# Patient Record
Sex: Female | Born: 1935 | Race: White | Hispanic: No | Smoking: Former smoker
Health system: Southern US, Community
[De-identification: ages and names within clinical notes are randomized; demographics above are authoritative.]

## PROBLEM LIST (undated history)

## (undated) DIAGNOSIS — D219 Benign neoplasm of connective and other soft tissue, unspecified: Secondary | ICD-10-CM

## (undated) DIAGNOSIS — M818 Other osteoporosis without current pathological fracture: Secondary | ICD-10-CM

## (undated) DIAGNOSIS — K589 Irritable bowel syndrome without diarrhea: Secondary | ICD-10-CM

## (undated) DIAGNOSIS — M1990 Unspecified osteoarthritis, unspecified site: Secondary | ICD-10-CM

## (undated) DIAGNOSIS — R0989 Other specified symptoms and signs involving the circulatory and respiratory systems: Secondary | ICD-10-CM

## (undated) DIAGNOSIS — N939 Abnormal uterine and vaginal bleeding, unspecified: Secondary | ICD-10-CM

## (undated) DIAGNOSIS — G629 Polyneuropathy, unspecified: Secondary | ICD-10-CM

## (undated) DIAGNOSIS — N8110 Cystocele, unspecified: Secondary | ICD-10-CM

## (undated) DIAGNOSIS — Z85038 Personal history of other malignant neoplasm of large intestine: Secondary | ICD-10-CM

## (undated) DIAGNOSIS — K648 Other hemorrhoids: Secondary | ICD-10-CM

## (undated) DIAGNOSIS — I10 Essential (primary) hypertension: Secondary | ICD-10-CM

## (undated) DIAGNOSIS — Z860100 Personal history of colon polyps, unspecified: Secondary | ICD-10-CM

## (undated) DIAGNOSIS — I8390 Asymptomatic varicose veins of unspecified lower extremity: Secondary | ICD-10-CM

## (undated) DIAGNOSIS — M4800 Spinal stenosis, site unspecified: Secondary | ICD-10-CM

## (undated) DIAGNOSIS — M169 Osteoarthritis of hip, unspecified: Secondary | ICD-10-CM

## (undated) DIAGNOSIS — I341 Nonrheumatic mitral (valve) prolapse: Secondary | ICD-10-CM

## (undated) DIAGNOSIS — E039 Hypothyroidism, unspecified: Secondary | ICD-10-CM

## (undated) DIAGNOSIS — D649 Anemia, unspecified: Secondary | ICD-10-CM

## (undated) DIAGNOSIS — K649 Unspecified hemorrhoids: Secondary | ICD-10-CM

## (undated) DIAGNOSIS — K5790 Diverticulosis of intestine, part unspecified, without perforation or abscess without bleeding: Secondary | ICD-10-CM

## (undated) DIAGNOSIS — E785 Hyperlipidemia, unspecified: Secondary | ICD-10-CM

## (undated) DIAGNOSIS — C439 Malignant melanoma of skin, unspecified: Secondary | ICD-10-CM

## (undated) DIAGNOSIS — R159 Full incontinence of feces: Secondary | ICD-10-CM

## (undated) DIAGNOSIS — N368 Other specified disorders of urethra: Secondary | ICD-10-CM

## (undated) DIAGNOSIS — K219 Gastro-esophageal reflux disease without esophagitis: Secondary | ICD-10-CM

## (undated) DIAGNOSIS — M1610 Unilateral primary osteoarthritis, unspecified hip: Secondary | ICD-10-CM

## (undated) DIAGNOSIS — N952 Postmenopausal atrophic vaginitis: Secondary | ICD-10-CM

## (undated) HISTORY — DX: Other specified disorders of urethra: N36.8

## (undated) HISTORY — DX: Nonrheumatic mitral (valve) prolapse: I34.1

## (undated) HISTORY — PX: HYSTEROSCOPY: SHX211

## (undated) HISTORY — DX: Unspecified hemorrhoids: K64.9

## (undated) HISTORY — DX: Essential (primary) hypertension: I10

## (undated) HISTORY — DX: Unspecified osteoarthritis, unspecified site: M19.90

## (undated) HISTORY — DX: Hyperlipidemia, unspecified: E78.5

## (undated) HISTORY — DX: Gastro-esophageal reflux disease without esophagitis: K21.9

## (undated) HISTORY — DX: Anemia, unspecified: D64.9

## (undated) HISTORY — DX: Other specified symptoms and signs involving the circulatory and respiratory systems: R09.89

## (undated) HISTORY — DX: Asymptomatic varicose veins of unspecified lower extremity: I83.90

## (undated) HISTORY — DX: Hypothyroidism, unspecified: E03.9

## (undated) HISTORY — DX: Polyneuropathy, unspecified: G62.9

## (undated) HISTORY — DX: Postmenopausal atrophic vaginitis: N95.2

## (undated) HISTORY — DX: Malignant melanoma of skin, unspecified: C43.9

## (undated) HISTORY — PX: DILATION AND CURETTAGE OF UTERUS: SHX78

## (undated) HISTORY — DX: Osteoarthritis of hip, unspecified: M16.9

## (undated) HISTORY — DX: Other osteoporosis without current pathological fracture: M81.8

## (undated) HISTORY — DX: Irritable bowel syndrome, unspecified: K58.9

## (undated) HISTORY — DX: Personal history of colon polyps, unspecified: Z86.0100

## (undated) HISTORY — DX: Personal history of colonic polyps: Z86.010

## (undated) HISTORY — PX: MELANOMA EXCISION: SHX5266

## (undated) HISTORY — DX: Spinal stenosis, site unspecified: M48.00

## (undated) HISTORY — DX: Diverticulosis of intestine, part unspecified, without perforation or abscess without bleeding: K57.90

## (undated) HISTORY — DX: Abnormal uterine and vaginal bleeding, unspecified: N93.9

## (undated) HISTORY — DX: Other hemorrhoids: K64.8

## (undated) HISTORY — DX: Personal history of other malignant neoplasm of large intestine: Z85.038

## (undated) HISTORY — DX: Unilateral primary osteoarthritis, unspecified hip: M16.10

## (undated) HISTORY — DX: Cystocele, unspecified: N81.10

## (undated) HISTORY — DX: Full incontinence of feces: R15.9

## (undated) HISTORY — DX: Benign neoplasm of connective and other soft tissue, unspecified: D21.9

---

## 1963-06-18 HISTORY — PX: HEMORRHOID SURGERY: SHX153

## 1998-07-18 ENCOUNTER — Encounter: Payer: Self-pay | Admitting: Obstetrics and Gynecology

## 1998-07-18 ENCOUNTER — Ambulatory Visit (HOSPITAL_COMMUNITY): Admission: RE | Admit: 1998-07-18 | Discharge: 1998-07-18 | Payer: Self-pay | Admitting: Obstetrics and Gynecology

## 1998-08-23 ENCOUNTER — Other Ambulatory Visit: Admission: RE | Admit: 1998-08-23 | Discharge: 1998-08-23 | Payer: Self-pay | Admitting: Obstetrics and Gynecology

## 1999-04-25 ENCOUNTER — Ambulatory Visit (HOSPITAL_COMMUNITY): Admission: RE | Admit: 1999-04-25 | Discharge: 1999-04-25 | Payer: Self-pay | Admitting: Gastroenterology

## 1999-04-25 ENCOUNTER — Encounter: Payer: Self-pay | Admitting: Gastroenterology

## 1999-07-13 ENCOUNTER — Encounter: Payer: Self-pay | Admitting: Urology

## 1999-07-13 ENCOUNTER — Encounter: Admission: RE | Admit: 1999-07-13 | Discharge: 1999-07-13 | Payer: Self-pay | Admitting: Urology

## 1999-07-20 ENCOUNTER — Ambulatory Visit (HOSPITAL_COMMUNITY): Admission: RE | Admit: 1999-07-20 | Discharge: 1999-07-20 | Payer: Self-pay | Admitting: Obstetrics and Gynecology

## 1999-07-20 ENCOUNTER — Encounter: Payer: Self-pay | Admitting: Obstetrics and Gynecology

## 1999-08-31 ENCOUNTER — Other Ambulatory Visit: Admission: RE | Admit: 1999-08-31 | Discharge: 1999-08-31 | Payer: Self-pay | Admitting: Obstetrics and Gynecology

## 1999-10-01 ENCOUNTER — Other Ambulatory Visit: Admission: RE | Admit: 1999-10-01 | Discharge: 1999-10-01 | Payer: Self-pay | Admitting: Obstetrics and Gynecology

## 1999-10-31 ENCOUNTER — Encounter: Payer: Self-pay | Admitting: Emergency Medicine

## 1999-10-31 ENCOUNTER — Emergency Department (HOSPITAL_COMMUNITY): Admission: EM | Admit: 1999-10-31 | Discharge: 1999-10-31 | Payer: Self-pay | Admitting: Emergency Medicine

## 1999-12-04 ENCOUNTER — Other Ambulatory Visit: Admission: RE | Admit: 1999-12-04 | Discharge: 1999-12-04 | Payer: Self-pay | Admitting: Obstetrics and Gynecology

## 1999-12-29 ENCOUNTER — Emergency Department (HOSPITAL_COMMUNITY): Admission: EM | Admit: 1999-12-29 | Discharge: 1999-12-29 | Payer: Self-pay | Admitting: Emergency Medicine

## 2000-04-08 ENCOUNTER — Other Ambulatory Visit: Admission: RE | Admit: 2000-04-08 | Discharge: 2000-04-08 | Payer: Self-pay | Admitting: Obstetrics and Gynecology

## 2000-06-17 HISTORY — PX: HEMICOLECTOMY: SHX854

## 2000-07-22 ENCOUNTER — Ambulatory Visit (HOSPITAL_COMMUNITY): Admission: RE | Admit: 2000-07-22 | Discharge: 2000-07-22 | Payer: Self-pay | Admitting: Obstetrics and Gynecology

## 2000-07-22 ENCOUNTER — Encounter: Payer: Self-pay | Admitting: Obstetrics and Gynecology

## 2000-09-02 ENCOUNTER — Other Ambulatory Visit: Admission: RE | Admit: 2000-09-02 | Discharge: 2000-09-02 | Payer: Self-pay | Admitting: Obstetrics and Gynecology

## 2000-10-06 ENCOUNTER — Encounter: Payer: Self-pay | Admitting: Obstetrics and Gynecology

## 2000-10-07 ENCOUNTER — Other Ambulatory Visit: Admission: RE | Admit: 2000-10-07 | Discharge: 2000-10-07 | Payer: Self-pay | Admitting: Gastroenterology

## 2000-10-07 ENCOUNTER — Encounter (INDEPENDENT_AMBULATORY_CARE_PROVIDER_SITE_OTHER): Payer: Self-pay

## 2000-10-08 ENCOUNTER — Ambulatory Visit (HOSPITAL_COMMUNITY): Admission: RE | Admit: 2000-10-08 | Discharge: 2000-10-08 | Payer: Self-pay | Admitting: Gastroenterology

## 2000-10-08 ENCOUNTER — Encounter: Payer: Self-pay | Admitting: Gastroenterology

## 2000-10-09 ENCOUNTER — Ambulatory Visit (HOSPITAL_COMMUNITY): Admission: RE | Admit: 2000-10-09 | Discharge: 2000-10-09 | Payer: Self-pay | Admitting: Obstetrics and Gynecology

## 2000-10-09 ENCOUNTER — Encounter (INDEPENDENT_AMBULATORY_CARE_PROVIDER_SITE_OTHER): Payer: Self-pay | Admitting: Specialist

## 2000-10-16 ENCOUNTER — Inpatient Hospital Stay (HOSPITAL_COMMUNITY): Admission: RE | Admit: 2000-10-16 | Discharge: 2000-10-21 | Payer: Self-pay | Admitting: General Surgery

## 2000-10-16 ENCOUNTER — Encounter (INDEPENDENT_AMBULATORY_CARE_PROVIDER_SITE_OTHER): Payer: Self-pay | Admitting: Specialist

## 2001-01-30 ENCOUNTER — Ambulatory Visit (HOSPITAL_COMMUNITY): Admission: RE | Admit: 2001-01-30 | Discharge: 2001-01-30 | Payer: Self-pay | Admitting: General Surgery

## 2001-01-30 ENCOUNTER — Encounter: Payer: Self-pay | Admitting: General Surgery

## 2001-02-23 ENCOUNTER — Ambulatory Visit (HOSPITAL_COMMUNITY): Admission: RE | Admit: 2001-02-23 | Discharge: 2001-02-23 | Payer: Self-pay | Admitting: Hematology and Oncology

## 2001-02-23 ENCOUNTER — Encounter: Payer: Self-pay | Admitting: Hematology and Oncology

## 2001-05-27 ENCOUNTER — Ambulatory Visit (HOSPITAL_COMMUNITY): Admission: RE | Admit: 2001-05-27 | Discharge: 2001-05-27 | Payer: Self-pay | Admitting: Hematology and Oncology

## 2001-05-27 ENCOUNTER — Encounter: Payer: Self-pay | Admitting: Hematology and Oncology

## 2001-07-21 ENCOUNTER — Encounter: Payer: Self-pay | Admitting: Obstetrics and Gynecology

## 2001-07-21 ENCOUNTER — Ambulatory Visit (HOSPITAL_COMMUNITY): Admission: RE | Admit: 2001-07-21 | Discharge: 2001-07-21 | Payer: Self-pay | Admitting: Obstetrics and Gynecology

## 2001-09-07 ENCOUNTER — Other Ambulatory Visit: Admission: RE | Admit: 2001-09-07 | Discharge: 2001-09-07 | Payer: Self-pay | Admitting: Obstetrics and Gynecology

## 2001-12-15 ENCOUNTER — Ambulatory Visit (HOSPITAL_COMMUNITY): Admission: RE | Admit: 2001-12-15 | Discharge: 2001-12-15 | Payer: Self-pay | Admitting: Hematology and Oncology

## 2001-12-15 ENCOUNTER — Encounter: Payer: Self-pay | Admitting: Hematology and Oncology

## 2002-07-01 ENCOUNTER — Encounter: Payer: Self-pay | Admitting: Internal Medicine

## 2002-07-01 ENCOUNTER — Ambulatory Visit (HOSPITAL_COMMUNITY): Admission: RE | Admit: 2002-07-01 | Discharge: 2002-07-01 | Payer: Self-pay | Admitting: Internal Medicine

## 2002-08-09 ENCOUNTER — Encounter: Payer: Self-pay | Admitting: Obstetrics and Gynecology

## 2002-08-09 ENCOUNTER — Ambulatory Visit (HOSPITAL_COMMUNITY): Admission: RE | Admit: 2002-08-09 | Discharge: 2002-08-09 | Payer: Self-pay | Admitting: Obstetrics and Gynecology

## 2002-09-10 ENCOUNTER — Other Ambulatory Visit: Admission: RE | Admit: 2002-09-10 | Discharge: 2002-09-10 | Payer: Self-pay | Admitting: Obstetrics and Gynecology

## 2003-06-24 ENCOUNTER — Ambulatory Visit (HOSPITAL_COMMUNITY): Admission: RE | Admit: 2003-06-24 | Discharge: 2003-06-24 | Payer: Self-pay | Admitting: Oncology

## 2003-08-11 ENCOUNTER — Ambulatory Visit (HOSPITAL_COMMUNITY): Admission: RE | Admit: 2003-08-11 | Discharge: 2003-08-11 | Payer: Self-pay | Admitting: Oncology

## 2003-09-13 ENCOUNTER — Other Ambulatory Visit: Admission: RE | Admit: 2003-09-13 | Discharge: 2003-09-13 | Payer: Self-pay | Admitting: Obstetrics and Gynecology

## 2003-12-20 ENCOUNTER — Ambulatory Visit (HOSPITAL_COMMUNITY): Admission: RE | Admit: 2003-12-20 | Discharge: 2003-12-20 | Payer: Self-pay | Admitting: Oncology

## 2003-12-23 ENCOUNTER — Ambulatory Visit (HOSPITAL_COMMUNITY): Admission: RE | Admit: 2003-12-23 | Discharge: 2003-12-23 | Payer: Self-pay | Admitting: Oncology

## 2004-04-20 ENCOUNTER — Ambulatory Visit: Payer: Self-pay | Admitting: Cardiology

## 2004-05-07 ENCOUNTER — Ambulatory Visit: Payer: Self-pay | Admitting: Internal Medicine

## 2004-06-04 ENCOUNTER — Ambulatory Visit: Payer: Self-pay | Admitting: Gastroenterology

## 2004-06-13 ENCOUNTER — Ambulatory Visit: Payer: Self-pay | Admitting: Gastroenterology

## 2004-06-17 HISTORY — PX: COLONOSCOPY W/ POLYPECTOMY: SHX1380

## 2004-06-26 ENCOUNTER — Ambulatory Visit (HOSPITAL_COMMUNITY): Admission: RE | Admit: 2004-06-26 | Discharge: 2004-06-26 | Payer: Self-pay | Admitting: Oncology

## 2004-06-26 ENCOUNTER — Ambulatory Visit: Payer: Self-pay | Admitting: Oncology

## 2004-06-27 ENCOUNTER — Ambulatory Visit (HOSPITAL_COMMUNITY): Admission: RE | Admit: 2004-06-27 | Discharge: 2004-06-27 | Payer: Self-pay | Admitting: Oncology

## 2004-08-16 ENCOUNTER — Ambulatory Visit: Payer: Self-pay | Admitting: Internal Medicine

## 2004-08-21 ENCOUNTER — Ambulatory Visit (HOSPITAL_COMMUNITY): Admission: RE | Admit: 2004-08-21 | Discharge: 2004-08-21 | Payer: Self-pay | Admitting: Obstetrics and Gynecology

## 2004-08-23 ENCOUNTER — Ambulatory Visit: Payer: Self-pay

## 2004-09-26 ENCOUNTER — Other Ambulatory Visit: Admission: RE | Admit: 2004-09-26 | Discharge: 2004-09-26 | Payer: Self-pay | Admitting: Obstetrics and Gynecology

## 2004-11-16 ENCOUNTER — Ambulatory Visit: Payer: Self-pay | Admitting: Cardiology

## 2004-12-12 ENCOUNTER — Ambulatory Visit: Payer: Self-pay | Admitting: Gastroenterology

## 2004-12-20 ENCOUNTER — Ambulatory Visit: Payer: Self-pay | Admitting: Gastroenterology

## 2004-12-24 ENCOUNTER — Ambulatory Visit: Payer: Self-pay | Admitting: Oncology

## 2004-12-27 ENCOUNTER — Ambulatory Visit (HOSPITAL_COMMUNITY): Admission: RE | Admit: 2004-12-27 | Discharge: 2004-12-27 | Payer: Self-pay | Admitting: Oncology

## 2005-04-24 ENCOUNTER — Ambulatory Visit: Payer: Self-pay | Admitting: Internal Medicine

## 2005-05-01 ENCOUNTER — Ambulatory Visit: Payer: Self-pay | Admitting: Gastroenterology

## 2005-06-18 ENCOUNTER — Ambulatory Visit: Payer: Self-pay | Admitting: Internal Medicine

## 2005-06-21 ENCOUNTER — Ambulatory Visit: Payer: Self-pay | Admitting: Cardiology

## 2005-06-24 ENCOUNTER — Ambulatory Visit: Payer: Self-pay | Admitting: Oncology

## 2005-06-27 ENCOUNTER — Ambulatory Visit (HOSPITAL_COMMUNITY): Admission: RE | Admit: 2005-06-27 | Discharge: 2005-06-27 | Payer: Self-pay | Admitting: Oncology

## 2005-07-19 ENCOUNTER — Encounter: Admission: RE | Admit: 2005-07-19 | Discharge: 2005-07-19 | Payer: Self-pay | Admitting: Otolaryngology

## 2005-08-22 ENCOUNTER — Ambulatory Visit (HOSPITAL_COMMUNITY): Admission: RE | Admit: 2005-08-22 | Discharge: 2005-08-22 | Payer: Self-pay | Admitting: Obstetrics and Gynecology

## 2005-09-30 ENCOUNTER — Other Ambulatory Visit: Admission: RE | Admit: 2005-09-30 | Discharge: 2005-09-30 | Payer: Self-pay | Admitting: Obstetrics and Gynecology

## 2005-10-23 ENCOUNTER — Ambulatory Visit: Payer: Self-pay | Admitting: Gastroenterology

## 2005-11-01 ENCOUNTER — Ambulatory Visit: Payer: Self-pay | Admitting: Gastroenterology

## 2005-12-09 ENCOUNTER — Ambulatory Visit: Payer: Self-pay | Admitting: Gastroenterology

## 2005-12-27 ENCOUNTER — Ambulatory Visit: Payer: Self-pay | Admitting: Cardiology

## 2005-12-30 ENCOUNTER — Ambulatory Visit: Payer: Self-pay | Admitting: Gastroenterology

## 2005-12-30 ENCOUNTER — Ambulatory Visit: Payer: Self-pay | Admitting: Cardiology

## 2006-01-21 ENCOUNTER — Ambulatory Visit: Payer: Self-pay | Admitting: Internal Medicine

## 2006-02-05 ENCOUNTER — Ambulatory Visit: Payer: Self-pay | Admitting: Internal Medicine

## 2006-05-05 ENCOUNTER — Ambulatory Visit: Payer: Self-pay | Admitting: Gastroenterology

## 2006-05-06 ENCOUNTER — Ambulatory Visit: Payer: Self-pay | Admitting: Gastroenterology

## 2006-05-06 LAB — CONVERTED CEMR LAB
OCCULT 2: NEGATIVE
OCCULT 3: NEGATIVE
OCCULT 4: NEGATIVE

## 2006-06-19 ENCOUNTER — Ambulatory Visit: Payer: Self-pay | Admitting: Oncology

## 2006-06-24 LAB — CBC WITH DIFFERENTIAL/PLATELET
Eosinophils Absolute: 0.1 10*3/uL (ref 0.0–0.5)
LYMPH%: 27.4 % (ref 14.0–48.0)
MCV: 93 fL (ref 81.0–101.0)
MONO%: 7.1 % (ref 0.0–13.0)
NEUT#: 2.9 10*3/uL (ref 1.5–6.5)
Platelets: 200 10*3/uL (ref 145–400)
RBC: 3.93 10*6/uL (ref 3.70–5.32)

## 2006-06-24 LAB — COMPREHENSIVE METABOLIC PANEL
Alkaline Phosphatase: 65 U/L (ref 39–117)
BUN: 18 mg/dL (ref 6–23)
Creatinine, Ser: 0.71 mg/dL (ref 0.40–1.20)
Glucose, Bld: 113 mg/dL — ABNORMAL HIGH (ref 70–99)
Sodium: 141 mEq/L (ref 135–145)
Total Bilirubin: 0.4 mg/dL (ref 0.3–1.2)
Total Protein: 6.9 g/dL (ref 6.0–8.3)

## 2006-06-24 LAB — LACTATE DEHYDROGENASE: LDH: 181 U/L (ref 94–250)

## 2006-06-26 ENCOUNTER — Ambulatory Visit (HOSPITAL_COMMUNITY): Admission: RE | Admit: 2006-06-26 | Discharge: 2006-06-26 | Payer: Self-pay | Admitting: Oncology

## 2006-06-26 ENCOUNTER — Ambulatory Visit: Payer: Self-pay | Admitting: Gastroenterology

## 2006-07-16 ENCOUNTER — Ambulatory Visit: Payer: Self-pay | Admitting: Internal Medicine

## 2006-08-13 ENCOUNTER — Ambulatory Visit: Payer: Self-pay | Admitting: Gastroenterology

## 2006-08-21 ENCOUNTER — Ambulatory Visit: Payer: Self-pay | Admitting: Internal Medicine

## 2006-08-22 ENCOUNTER — Observation Stay (HOSPITAL_COMMUNITY): Admission: EM | Admit: 2006-08-22 | Discharge: 2006-08-22 | Payer: Self-pay | Admitting: Emergency Medicine

## 2006-08-22 ENCOUNTER — Ambulatory Visit: Payer: Self-pay | Admitting: Cardiology

## 2006-08-26 ENCOUNTER — Ambulatory Visit (HOSPITAL_COMMUNITY): Admission: RE | Admit: 2006-08-26 | Discharge: 2006-08-26 | Payer: Self-pay | Admitting: Family Medicine

## 2006-09-03 ENCOUNTER — Encounter: Admission: RE | Admit: 2006-09-03 | Discharge: 2006-09-03 | Payer: Self-pay | Admitting: Otolaryngology

## 2006-10-07 ENCOUNTER — Other Ambulatory Visit: Admission: RE | Admit: 2006-10-07 | Discharge: 2006-10-07 | Payer: Self-pay | Admitting: Obstetrics and Gynecology

## 2006-11-19 ENCOUNTER — Ambulatory Visit: Payer: Self-pay | Admitting: Gastroenterology

## 2006-11-19 LAB — CONVERTED CEMR LAB
Saturation Ratios: 15.3 % — ABNORMAL LOW (ref 20.0–50.0)
Tissue Transglutaminase Ab, IgA: <1 units (ref <7)
Vitamin B-12: 449 pg/mL (ref 211–911)

## 2006-11-28 ENCOUNTER — Ambulatory Visit: Payer: Self-pay | Admitting: Internal Medicine

## 2006-12-02 ENCOUNTER — Ambulatory Visit: Payer: Self-pay | Admitting: Internal Medicine

## 2006-12-02 LAB — CONVERTED CEMR LAB
CO2: 33 meq/L — ABNORMAL HIGH (ref 19–32)
Chloride: 105 meq/L (ref 96–112)
Cholesterol: 209 mg/dL (ref 0–200)
Creatinine, Ser: 0.6 mg/dL (ref 0.4–1.2)
Glucose, Bld: 95 mg/dL (ref 70–99)
HDL: 50 mg/dL (ref >39.0)
Hgb A1c MFr Bld: 5.9 % (ref 4.6–6.0)
Potassium: 4.5 meq/L (ref 3.5–5.1)
Sodium: 143 meq/L (ref 135–145)
VLDL: 27 mg/dL (ref 0–40)

## 2006-12-08 ENCOUNTER — Ambulatory Visit: Payer: Self-pay | Admitting: Internal Medicine

## 2006-12-12 ENCOUNTER — Encounter: Admission: RE | Admit: 2006-12-12 | Discharge: 2006-12-12 | Payer: Self-pay | Admitting: Internal Medicine

## 2006-12-12 ENCOUNTER — Ambulatory Visit: Payer: Self-pay | Admitting: Internal Medicine

## 2006-12-16 ENCOUNTER — Ambulatory Visit: Payer: Self-pay | Admitting: Cardiology

## 2006-12-17 ENCOUNTER — Ambulatory Visit: Payer: Self-pay | Admitting: Internal Medicine

## 2007-02-25 ENCOUNTER — Encounter: Payer: Self-pay | Admitting: Internal Medicine

## 2007-02-25 DIAGNOSIS — G609 Hereditary and idiopathic neuropathy, unspecified: Secondary | ICD-10-CM

## 2007-02-25 DIAGNOSIS — M48061 Spinal stenosis, lumbar region without neurogenic claudication: Secondary | ICD-10-CM | POA: Insufficient documentation

## 2007-02-25 DIAGNOSIS — E785 Hyperlipidemia, unspecified: Secondary | ICD-10-CM

## 2007-02-25 DIAGNOSIS — Z85038 Personal history of other malignant neoplasm of large intestine: Secondary | ICD-10-CM

## 2007-02-25 DIAGNOSIS — E039 Hypothyroidism, unspecified: Secondary | ICD-10-CM

## 2007-02-25 DIAGNOSIS — Z9189 Other specified personal risk factors, not elsewhere classified: Secondary | ICD-10-CM | POA: Insufficient documentation

## 2007-02-25 DIAGNOSIS — Z8719 Personal history of other diseases of the digestive system: Secondary | ICD-10-CM

## 2007-02-25 DIAGNOSIS — K219 Gastro-esophageal reflux disease without esophagitis: Secondary | ICD-10-CM

## 2007-03-27 ENCOUNTER — Ambulatory Visit: Payer: Self-pay | Admitting: Internal Medicine

## 2007-03-27 LAB — CONVERTED CEMR LAB
Basophils Absolute: 0 10*3/uL (ref 0.0–0.1)
Eosinophils Absolute: 0.2 10*3/uL (ref 0.0–0.6)
Ferritin: 12.9 ng/mL (ref 10.0–291.0)
HCT: 36.8 % (ref 36.0–46.0)
Hemoglobin: 12.7 g/dL (ref 12.0–15.0)
Lymphocytes Relative: 27.4 % (ref 12.0–46.0)
MCHC: 34.4 g/dL (ref 30.0–36.0)
MCV: 93.2 fL (ref 78.0–100.0)
Monocytes Absolute: 0.4 10*3/uL (ref 0.2–0.7)
Monocytes Relative: 7 % (ref 3.0–11.0)
Neutro Abs: 3.5 10*3/uL (ref 1.4–7.7)
Neutrophils Relative %: 61.9 % (ref 43.0–77.0)

## 2007-04-28 ENCOUNTER — Ambulatory Visit: Payer: Self-pay | Admitting: Internal Medicine

## 2007-04-28 LAB — CONVERTED CEMR LAB
Basophils Absolute: 0 10*3/uL (ref 0.0–0.1)
Basophils Relative: 0.5 % (ref 0.0–1.0)
Ferritin: 25.9 ng/mL (ref 10.0–291.0)
Hemoglobin: 12.9 g/dL (ref 12.0–15.0)
MCHC: 34.8 g/dL (ref 30.0–36.0)
Monocytes Absolute: 0.3 10*3/uL (ref 0.2–0.7)
Monocytes Relative: 7 % (ref 3.0–11.0)
Platelets: 172 10*3/uL (ref 150–400)
RDW: 13.2 % (ref 11.5–14.6)

## 2007-05-01 ENCOUNTER — Ambulatory Visit: Payer: Self-pay | Admitting: Internal Medicine

## 2007-05-06 ENCOUNTER — Encounter: Payer: Self-pay | Admitting: Internal Medicine

## 2007-05-07 ENCOUNTER — Encounter: Payer: Self-pay | Admitting: Internal Medicine

## 2007-05-12 ENCOUNTER — Ambulatory Visit: Payer: Self-pay | Admitting: Internal Medicine

## 2007-06-12 ENCOUNTER — Encounter: Payer: Self-pay | Admitting: Internal Medicine

## 2007-06-12 ENCOUNTER — Ambulatory Visit: Payer: Self-pay | Admitting: Internal Medicine

## 2007-06-12 DIAGNOSIS — K589 Irritable bowel syndrome without diarrhea: Secondary | ICD-10-CM | POA: Insufficient documentation

## 2007-06-12 DIAGNOSIS — D509 Iron deficiency anemia, unspecified: Secondary | ICD-10-CM

## 2007-06-12 DIAGNOSIS — K648 Other hemorrhoids: Secondary | ICD-10-CM

## 2007-06-12 LAB — CONVERTED CEMR LAB: Ferritin: 25.3 ng/mL (ref 10.0–291.0)

## 2007-06-18 HISTORY — PX: ESOPHAGOGASTRODUODENOSCOPY: SHX1529

## 2007-06-22 ENCOUNTER — Ambulatory Visit: Payer: Self-pay | Admitting: Oncology

## 2007-06-24 ENCOUNTER — Encounter: Payer: Self-pay | Admitting: Internal Medicine

## 2007-06-24 LAB — CBC WITH DIFFERENTIAL/PLATELET
BASO%: 0.2 % (ref 0.0–2.0)
Basophils Absolute: 0 10*3/uL (ref 0.0–0.1)
Eosinophils Absolute: 0.1 10*3/uL (ref 0.0–0.5)
HCT: 35.3 % (ref 34.8–46.6)
HGB: 12.1 g/dL (ref 11.6–15.9)
MONO#: 0.3 10*3/uL (ref 0.1–0.9)
NEUT#: 4.1 10*3/uL (ref 1.5–6.5)
NEUT%: 69.9 % (ref 39.6–76.8)
Platelets: 210 10*3/uL (ref 145–400)
WBC: 5.9 10*3/uL (ref 3.9–10.0)
lymph#: 1.3 10*3/uL (ref 0.9–3.3)

## 2007-06-25 LAB — COMPREHENSIVE METABOLIC PANEL
AST: 32 U/L (ref 0–37)
Albumin: 3.9 g/dL (ref 3.5–5.2)
BUN: 18 mg/dL (ref 6–23)
CO2: 26 mEq/L (ref 19–32)
Calcium: 8.6 mg/dL (ref 8.4–10.5)
Chloride: 105 mEq/L (ref 96–112)
Glucose, Bld: 102 mg/dL — ABNORMAL HIGH (ref 70–99)
Potassium: 3.7 mEq/L (ref 3.5–5.3)

## 2007-06-25 LAB — LACTATE DEHYDROGENASE: LDH: 192 U/L (ref 94–250)

## 2007-06-30 ENCOUNTER — Encounter: Payer: Self-pay | Admitting: Internal Medicine

## 2007-07-13 ENCOUNTER — Ambulatory Visit: Payer: Self-pay | Admitting: Internal Medicine

## 2007-07-13 DIAGNOSIS — K5289 Other specified noninfective gastroenteritis and colitis: Secondary | ICD-10-CM

## 2007-07-13 DIAGNOSIS — C439 Malignant melanoma of skin, unspecified: Secondary | ICD-10-CM | POA: Insufficient documentation

## 2007-08-06 ENCOUNTER — Telehealth (INDEPENDENT_AMBULATORY_CARE_PROVIDER_SITE_OTHER): Payer: Self-pay | Admitting: *Deleted

## 2007-08-10 ENCOUNTER — Ambulatory Visit: Payer: Self-pay | Admitting: Internal Medicine

## 2007-08-11 ENCOUNTER — Ambulatory Visit: Payer: Self-pay | Admitting: Internal Medicine

## 2007-08-11 ENCOUNTER — Encounter: Payer: Self-pay | Admitting: Internal Medicine

## 2007-09-01 ENCOUNTER — Ambulatory Visit (HOSPITAL_COMMUNITY): Admission: RE | Admit: 2007-09-01 | Discharge: 2007-09-01 | Payer: Self-pay | Admitting: Obstetrics and Gynecology

## 2007-09-15 ENCOUNTER — Ambulatory Visit: Payer: Self-pay | Admitting: Internal Medicine

## 2007-09-15 ENCOUNTER — Encounter: Payer: Self-pay | Admitting: Internal Medicine

## 2007-09-15 LAB — CONVERTED CEMR LAB
Basophils Absolute: 0 10*3/uL (ref 0.0–0.1)
Ferritin: 34.2 ng/mL (ref 10.0–291.0)
Hemoglobin: 13.2 g/dL (ref 12.0–15.0)
Lymphocytes Relative: 18.7 % (ref 12.0–46.0)
MCHC: 33.7 g/dL (ref 30.0–36.0)
Monocytes Relative: 6.2 % (ref 3.0–12.0)
Neutrophils Relative %: 72.9 % (ref 43.0–77.0)
RBC: 4.15 M/uL (ref 3.87–5.11)
RDW: 13.4 % (ref 11.5–14.6)

## 2007-09-30 ENCOUNTER — Encounter: Payer: Self-pay | Admitting: Internal Medicine

## 2007-10-12 ENCOUNTER — Other Ambulatory Visit: Admission: RE | Admit: 2007-10-12 | Discharge: 2007-10-12 | Payer: Self-pay | Admitting: Obstetrics and Gynecology

## 2007-12-03 ENCOUNTER — Ambulatory Visit: Payer: Self-pay | Admitting: Internal Medicine

## 2007-12-03 DIAGNOSIS — N952 Postmenopausal atrophic vaginitis: Secondary | ICD-10-CM | POA: Insufficient documentation

## 2007-12-03 DIAGNOSIS — M810 Age-related osteoporosis without current pathological fracture: Secondary | ICD-10-CM

## 2007-12-03 DIAGNOSIS — D649 Anemia, unspecified: Secondary | ICD-10-CM

## 2007-12-08 ENCOUNTER — Ambulatory Visit: Payer: Self-pay | Admitting: Internal Medicine

## 2007-12-08 LAB — CONVERTED CEMR LAB
Direct LDL: 123.7 mg/dL
HDL: 44.3 mg/dL (ref >39.0)
Total CHOL/HDL Ratio: 4.6
VLDL: 26 mg/dL (ref 0–40)

## 2007-12-14 ENCOUNTER — Encounter: Payer: Self-pay | Admitting: Internal Medicine

## 2007-12-14 LAB — CONVERTED CEMR LAB
Basophils Relative: 0.5 % (ref 0.0–1.0)
Eosinophils Relative: 3.2 % (ref 0.0–5.0)
HCT: 39 % (ref 36.0–46.0)
Hemoglobin: 13.4 g/dL (ref 12.0–15.0)
Lymphocytes Relative: 26.4 % (ref 12.0–46.0)
Monocytes Absolute: 0.4 10*3/uL (ref 0.1–1.0)
Monocytes Relative: 6.9 % (ref 3.0–12.0)
Neutro Abs: 3.2 10*3/uL (ref 1.4–7.7)
RBC: 4.08 M/uL (ref 3.87–5.11)

## 2007-12-17 ENCOUNTER — Ambulatory Visit: Payer: Self-pay | Admitting: Cardiology

## 2008-01-19 ENCOUNTER — Encounter: Payer: Self-pay | Admitting: Internal Medicine

## 2008-01-24 ENCOUNTER — Encounter: Payer: Self-pay | Admitting: Internal Medicine

## 2008-01-26 ENCOUNTER — Encounter: Payer: Self-pay | Admitting: Internal Medicine

## 2008-01-27 ENCOUNTER — Ambulatory Visit: Payer: Self-pay | Admitting: Internal Medicine

## 2008-02-08 ENCOUNTER — Encounter: Admission: RE | Admit: 2008-02-08 | Discharge: 2008-02-08 | Payer: Self-pay | Admitting: Internal Medicine

## 2008-02-09 ENCOUNTER — Telehealth: Payer: Self-pay | Admitting: Internal Medicine

## 2008-02-12 ENCOUNTER — Ambulatory Visit: Payer: Self-pay | Admitting: Oncology

## 2008-02-16 ENCOUNTER — Encounter: Payer: Self-pay | Admitting: Internal Medicine

## 2008-02-16 LAB — CBC WITH DIFFERENTIAL/PLATELET
Basophils Absolute: 0 10*3/uL (ref 0.0–0.1)
EOS%: 1.9 % (ref 0.0–7.0)
HGB: 12.6 g/dL (ref 11.6–15.9)
LYMPH%: 14.3 % (ref 14.0–48.0)
MCH: 32 pg (ref 26.0–34.0)
MCV: 93.7 fL (ref 81.0–101.0)
MONO%: 6.8 % (ref 0.0–13.0)
Platelets: 167 10*3/uL (ref 145–400)
RBC: 3.93 10*6/uL (ref 3.70–5.32)
RDW: 13.6 % (ref 11.3–14.5)

## 2008-02-16 LAB — COMPREHENSIVE METABOLIC PANEL
ALT: 33 U/L (ref 0–35)
AST: 46 U/L — ABNORMAL HIGH (ref 0–37)
Albumin: 4 g/dL (ref 3.5–5.2)
Calcium: 9.1 mg/dL (ref 8.4–10.5)
Chloride: 100 mEq/L (ref 96–112)
Potassium: 4.1 mEq/L (ref 3.5–5.3)

## 2008-03-08 ENCOUNTER — Ambulatory Visit: Payer: Self-pay | Admitting: Obstetrics and Gynecology

## 2008-04-21 ENCOUNTER — Telehealth: Payer: Self-pay | Admitting: Internal Medicine

## 2008-05-10 ENCOUNTER — Telehealth: Payer: Self-pay | Admitting: Internal Medicine

## 2008-05-26 ENCOUNTER — Telehealth (INDEPENDENT_AMBULATORY_CARE_PROVIDER_SITE_OTHER): Payer: Self-pay | Admitting: *Deleted

## 2008-05-30 ENCOUNTER — Ambulatory Visit: Payer: Self-pay | Admitting: Internal Medicine

## 2008-06-08 ENCOUNTER — Ambulatory Visit: Payer: Self-pay | Admitting: Obstetrics and Gynecology

## 2008-06-16 ENCOUNTER — Ambulatory Visit: Payer: Self-pay | Admitting: Oncology

## 2008-06-21 ENCOUNTER — Encounter: Payer: Self-pay | Admitting: Internal Medicine

## 2008-06-21 LAB — LACTATE DEHYDROGENASE: LDH: 177 U/L (ref 94–250)

## 2008-06-21 LAB — COMPREHENSIVE METABOLIC PANEL
ALT: 17 U/L (ref 0–35)
AST: 30 U/L (ref 0–37)
CO2: 31 mEq/L (ref 19–32)
Calcium: 8.9 mg/dL (ref 8.4–10.5)
Chloride: 103 mEq/L (ref 96–112)
Sodium: 142 mEq/L (ref 135–145)
Total Protein: 6.7 g/dL (ref 6.0–8.3)

## 2008-06-21 LAB — CBC WITH DIFFERENTIAL/PLATELET
Basophils Absolute: 0 10*3/uL (ref 0.0–0.1)
Eosinophils Absolute: 0.1 10*3/uL (ref 0.0–0.5)
HCT: 35.6 % (ref 34.8–46.6)
HGB: 12.2 g/dL (ref 11.6–15.9)
MONO#: 0.4 10*3/uL (ref 0.1–0.9)
NEUT#: 4 10*3/uL (ref 1.5–6.5)
NEUT%: 75.5 % (ref 39.6–76.8)
WBC: 5.3 10*3/uL (ref 3.9–10.0)
lymph#: 0.8 10*3/uL — ABNORMAL LOW (ref 0.9–3.3)

## 2008-06-21 LAB — MORPHOLOGY: PLT EST: ADEQUATE

## 2008-06-28 ENCOUNTER — Encounter: Payer: Self-pay | Admitting: Internal Medicine

## 2008-07-19 ENCOUNTER — Encounter: Payer: Self-pay | Admitting: Internal Medicine

## 2008-09-01 ENCOUNTER — Ambulatory Visit (HOSPITAL_COMMUNITY): Admission: RE | Admit: 2008-09-01 | Discharge: 2008-09-01 | Payer: Self-pay | Admitting: Obstetrics and Gynecology

## 2008-09-21 ENCOUNTER — Encounter: Payer: Self-pay | Admitting: Internal Medicine

## 2008-10-12 ENCOUNTER — Other Ambulatory Visit: Admission: RE | Admit: 2008-10-12 | Discharge: 2008-10-12 | Payer: Self-pay | Admitting: Obstetrics and Gynecology

## 2008-10-12 ENCOUNTER — Encounter: Payer: Self-pay | Admitting: Obstetrics and Gynecology

## 2008-10-12 ENCOUNTER — Ambulatory Visit: Payer: Self-pay | Admitting: Obstetrics and Gynecology

## 2008-10-25 ENCOUNTER — Ambulatory Visit: Payer: Self-pay | Admitting: Internal Medicine

## 2008-10-31 ENCOUNTER — Telehealth: Payer: Self-pay | Admitting: Internal Medicine

## 2008-11-29 ENCOUNTER — Encounter: Payer: Self-pay | Admitting: Internal Medicine

## 2008-12-16 ENCOUNTER — Telehealth: Payer: Self-pay | Admitting: Internal Medicine

## 2009-01-06 ENCOUNTER — Ambulatory Visit: Payer: Self-pay | Admitting: Internal Medicine

## 2009-01-06 DIAGNOSIS — I8390 Asymptomatic varicose veins of unspecified lower extremity: Secondary | ICD-10-CM

## 2009-01-06 LAB — CONVERTED CEMR LAB
BUN: 19 mg/dL (ref 6–23)
Calcium: 9.3 mg/dL (ref 8.4–10.5)
Chloride: 105 meq/L (ref 96–112)
Creatinine, Ser: 0.9 mg/dL (ref 0.4–1.2)
Eosinophils Relative: 2.7 % (ref 0.0–5.0)
Folate: >20 ng/mL
Iron: 79 ug/dL (ref 42–145)
Lymphocytes Relative: 25.4 % (ref 12.0–46.0)
MCV: 94.3 fL (ref 78.0–100.0)
Monocytes Absolute: 0.3 10*3/uL (ref 0.1–1.0)
Monocytes Relative: 5.4 % (ref 3.0–12.0)
Neutrophils Relative %: 66.2 % (ref 43.0–77.0)
PTH: 41.6 pg/mL (ref 14.0–72.0)
Platelets: 173 10*3/uL (ref 150.0–400.0)
Saturation Ratios: 17.6 % — ABNORMAL LOW (ref 20.0–50.0)
Vitamin B-12: 568 pg/mL (ref 211–911)
WBC: 5.7 10*3/uL (ref 4.5–10.5)

## 2009-01-11 ENCOUNTER — Encounter: Payer: Self-pay | Admitting: Internal Medicine

## 2009-02-03 ENCOUNTER — Ambulatory Visit: Payer: Self-pay | Admitting: Gynecology

## 2009-02-07 ENCOUNTER — Ambulatory Visit: Payer: Self-pay | Admitting: Cardiology

## 2009-03-06 ENCOUNTER — Ambulatory Visit: Payer: Self-pay | Admitting: Internal Medicine

## 2009-03-06 ENCOUNTER — Telehealth: Payer: Self-pay | Admitting: Internal Medicine

## 2009-03-24 ENCOUNTER — Ambulatory Visit: Payer: Self-pay | Admitting: Obstetrics and Gynecology

## 2009-05-29 ENCOUNTER — Ambulatory Visit: Payer: Self-pay | Admitting: Internal Medicine

## 2009-06-02 ENCOUNTER — Telehealth: Payer: Self-pay | Admitting: Internal Medicine

## 2009-06-03 ENCOUNTER — Telehealth: Payer: Self-pay | Admitting: Internal Medicine

## 2009-06-13 ENCOUNTER — Ambulatory Visit: Payer: Self-pay | Admitting: Obstetrics and Gynecology

## 2009-06-15 ENCOUNTER — Ambulatory Visit: Payer: Self-pay | Admitting: Oncology

## 2009-06-20 ENCOUNTER — Encounter: Payer: Self-pay | Admitting: Internal Medicine

## 2009-06-20 LAB — CBC WITH DIFFERENTIAL/PLATELET
BASO%: 0.4 % (ref 0.0–2.0)
EOS%: 1.8 % (ref 0.0–7.0)
LYMPH%: 17.5 % (ref 14.0–49.7)
MCHC: 34 g/dL (ref 31.5–36.0)
MONO#: 0.4 10*3/uL (ref 0.1–0.9)
Platelets: 193 10*3/uL (ref 145–400)
RBC: 4.01 10*6/uL (ref 3.70–5.45)
WBC: 6.8 10*3/uL (ref 3.9–10.3)

## 2009-06-20 LAB — IRON AND TIBC
%SAT: 13 % — ABNORMAL LOW (ref 20–55)
TIBC: 416 ug/dL (ref 250–470)

## 2009-06-20 LAB — FERRITIN: Ferritin: 34 ng/mL (ref 10–291)

## 2009-06-20 LAB — COMPREHENSIVE METABOLIC PANEL
ALT: 23 U/L (ref 0–35)
AST: 39 U/L — ABNORMAL HIGH (ref 0–37)
Alkaline Phosphatase: 83 U/L (ref 39–117)
CO2: 29 mEq/L (ref 19–32)
Sodium: 140 mEq/L (ref 135–145)
Total Bilirubin: 0.4 mg/dL (ref 0.3–1.2)
Total Protein: 7 g/dL (ref 6.0–8.3)

## 2009-06-20 LAB — LACTATE DEHYDROGENASE: LDH: 204 U/L (ref 94–250)

## 2009-07-03 ENCOUNTER — Encounter: Payer: Self-pay | Admitting: Internal Medicine

## 2009-09-05 ENCOUNTER — Ambulatory Visit (HOSPITAL_COMMUNITY): Admission: RE | Admit: 2009-09-05 | Discharge: 2009-09-05 | Payer: Self-pay | Admitting: Obstetrics and Gynecology

## 2009-10-11 ENCOUNTER — Telehealth: Payer: Self-pay | Admitting: Internal Medicine

## 2009-10-13 ENCOUNTER — Other Ambulatory Visit: Admission: RE | Admit: 2009-10-13 | Discharge: 2009-10-13 | Payer: Self-pay | Admitting: Obstetrics and Gynecology

## 2009-10-13 ENCOUNTER — Ambulatory Visit: Payer: Self-pay | Admitting: Obstetrics and Gynecology

## 2009-10-18 ENCOUNTER — Encounter: Payer: Self-pay | Admitting: Internal Medicine

## 2009-10-18 ENCOUNTER — Telehealth: Payer: Self-pay | Admitting: Internal Medicine

## 2009-10-19 ENCOUNTER — Ambulatory Visit: Payer: Self-pay | Admitting: Internal Medicine

## 2009-10-19 DIAGNOSIS — R740 Nonspecific elevation of levels of transaminase and lactic acid dehydrogenase [LDH]: Secondary | ICD-10-CM

## 2009-10-19 LAB — CONVERTED CEMR LAB
AST: 40 units/L — ABNORMAL HIGH (ref 0–37)
Alkaline Phosphatase: 94 units/L (ref 39–117)
Basophils Relative: 0.4 % (ref 0.0–3.0)
Bilirubin, Direct: 0 mg/dL (ref 0.0–0.3)
Eosinophils Absolute: 0.2 10*3/uL (ref 0.0–0.7)
Hemoglobin: 11.8 g/dL — ABNORMAL LOW (ref 12.0–15.0)
MCHC: 34.5 g/dL (ref 30.0–36.0)
MCV: 93.2 fL (ref 78.0–100.0)
Monocytes Absolute: 0.4 10*3/uL (ref 0.1–1.0)
Neutro Abs: 3.3 10*3/uL (ref 1.4–7.7)
Neutrophils Relative %: 66.5 % (ref 43.0–77.0)
RBC: 3.67 M/uL — ABNORMAL LOW (ref 3.87–5.11)
Total Bilirubin: 0.3 mg/dL (ref 0.3–1.2)
Total CHOL/HDL Ratio: 3

## 2009-10-20 ENCOUNTER — Ambulatory Visit: Payer: Self-pay | Admitting: Obstetrics and Gynecology

## 2009-10-23 ENCOUNTER — Encounter: Payer: Self-pay | Admitting: Internal Medicine

## 2009-10-23 ENCOUNTER — Ambulatory Visit: Payer: Self-pay | Admitting: Obstetrics and Gynecology

## 2009-10-27 ENCOUNTER — Encounter: Admission: RE | Admit: 2009-10-27 | Discharge: 2009-10-27 | Payer: Self-pay | Admitting: Obstetrics and Gynecology

## 2009-10-27 ENCOUNTER — Encounter: Payer: Self-pay | Admitting: Internal Medicine

## 2009-11-01 ENCOUNTER — Ambulatory Visit: Payer: Self-pay | Admitting: Obstetrics and Gynecology

## 2009-11-03 ENCOUNTER — Ambulatory Visit: Payer: Self-pay | Admitting: Internal Medicine

## 2009-12-04 ENCOUNTER — Ambulatory Visit: Payer: Self-pay | Admitting: Internal Medicine

## 2009-12-04 LAB — CONVERTED CEMR LAB
Basophils Absolute: 0 10*3/uL (ref 0.0–0.1)
Eosinophils Relative: 2.7 % (ref 0.0–5.0)
HCT: 36.6 % (ref 36.0–46.0)
Lymphs Abs: 1.4 10*3/uL (ref 0.7–4.0)
MCV: 94.4 fL (ref 78.0–100.0)
Monocytes Absolute: 0.4 10*3/uL (ref 0.1–1.0)
Platelets: 173 10*3/uL (ref 150.0–400.0)
RDW: 14.1 % (ref 11.5–14.6)

## 2009-12-05 ENCOUNTER — Telehealth: Payer: Self-pay | Admitting: Internal Medicine

## 2009-12-06 ENCOUNTER — Ambulatory Visit: Admission: RE | Admit: 2009-12-06 | Discharge: 2009-12-06 | Payer: Self-pay | Admitting: Gynecology

## 2009-12-06 ENCOUNTER — Encounter: Payer: Self-pay | Admitting: Internal Medicine

## 2009-12-08 ENCOUNTER — Telehealth: Payer: Self-pay | Admitting: Internal Medicine

## 2009-12-14 ENCOUNTER — Ambulatory Visit: Payer: Self-pay | Admitting: Internal Medicine

## 2009-12-19 ENCOUNTER — Telehealth: Payer: Self-pay | Admitting: Internal Medicine

## 2009-12-29 ENCOUNTER — Telehealth: Payer: Self-pay | Admitting: Internal Medicine

## 2010-01-03 ENCOUNTER — Telehealth: Payer: Self-pay | Admitting: Internal Medicine

## 2010-01-08 ENCOUNTER — Ambulatory Visit: Payer: Self-pay | Admitting: Obstetrics and Gynecology

## 2010-02-05 ENCOUNTER — Encounter: Payer: Self-pay | Admitting: Internal Medicine

## 2010-02-05 ENCOUNTER — Telehealth: Payer: Self-pay | Admitting: Internal Medicine

## 2010-02-06 ENCOUNTER — Ambulatory Visit: Payer: Self-pay | Admitting: Cardiology

## 2010-02-13 ENCOUNTER — Encounter: Payer: Self-pay | Admitting: Internal Medicine

## 2010-03-05 ENCOUNTER — Ambulatory Visit: Payer: Self-pay | Admitting: Internal Medicine

## 2010-03-16 ENCOUNTER — Encounter: Payer: Self-pay | Admitting: Internal Medicine

## 2010-03-19 ENCOUNTER — Ambulatory Visit: Payer: Self-pay | Admitting: Obstetrics and Gynecology

## 2010-03-20 ENCOUNTER — Encounter: Payer: Self-pay | Admitting: Internal Medicine

## 2010-03-20 ENCOUNTER — Telehealth: Payer: Self-pay | Admitting: Internal Medicine

## 2010-03-21 ENCOUNTER — Telehealth: Payer: Self-pay | Admitting: Internal Medicine

## 2010-03-23 ENCOUNTER — Ambulatory Visit: Payer: Self-pay | Admitting: Internal Medicine

## 2010-03-23 DIAGNOSIS — R159 Full incontinence of feces: Secondary | ICD-10-CM | POA: Insufficient documentation

## 2010-04-27 ENCOUNTER — Telehealth: Payer: Self-pay | Admitting: Internal Medicine

## 2010-05-07 ENCOUNTER — Ambulatory Visit: Payer: Self-pay | Admitting: Obstetrics and Gynecology

## 2010-05-25 ENCOUNTER — Ambulatory Visit: Payer: Self-pay | Admitting: Obstetrics and Gynecology

## 2010-05-30 ENCOUNTER — Ambulatory Visit: Payer: Self-pay | Admitting: Obstetrics and Gynecology

## 2010-06-18 ENCOUNTER — Ambulatory Visit: Payer: Self-pay | Admitting: Obstetrics and Gynecology

## 2010-06-19 ENCOUNTER — Encounter
Admission: RE | Admit: 2010-06-19 | Discharge: 2010-06-19 | Payer: Self-pay | Source: Home / Self Care | Attending: Neurology | Admitting: Neurology

## 2010-06-20 ENCOUNTER — Telehealth: Payer: Self-pay | Admitting: Internal Medicine

## 2010-06-22 ENCOUNTER — Telehealth: Payer: Self-pay | Admitting: Physician Assistant

## 2010-06-22 ENCOUNTER — Ambulatory Visit
Admission: RE | Admit: 2010-06-22 | Discharge: 2010-06-22 | Payer: Self-pay | Source: Home / Self Care | Attending: Internal Medicine | Admitting: Internal Medicine

## 2010-06-22 ENCOUNTER — Telehealth: Payer: Self-pay | Admitting: Internal Medicine

## 2010-06-22 ENCOUNTER — Other Ambulatory Visit: Payer: Self-pay | Admitting: Physician Assistant

## 2010-06-22 LAB — CBC WITH DIFFERENTIAL/PLATELET
Basophils Absolute: 0 10*3/uL (ref 0.0–0.1)
Basophils Relative: 0.2 % (ref 0.0–3.0)
Eosinophils Absolute: 0.1 10*3/uL (ref 0.0–0.7)
Eosinophils Relative: 2 % (ref 0.0–5.0)
HCT: 34.4 % — ABNORMAL LOW (ref 36.0–46.0)
Hemoglobin: 11.6 g/dL — ABNORMAL LOW (ref 12.0–15.0)
Lymphocytes Relative: 18 % (ref 12.0–46.0)
Lymphs Abs: 1.2 10*3/uL (ref 0.7–4.0)
MCHC: 33.6 g/dL (ref 30.0–36.0)
MCV: 91.1 fl (ref 78.0–100.0)
Monocytes Absolute: 0.5 10*3/uL (ref 0.1–1.0)
Monocytes Relative: 7.6 % (ref 3.0–12.0)
Neutro Abs: 4.9 10*3/uL (ref 1.4–7.7)
Neutrophils Relative %: 72.2 % (ref 43.0–77.0)
Platelets: 215 10*3/uL (ref 150.0–400.0)
RBC: 3.78 Mil/uL — ABNORMAL LOW (ref 3.87–5.11)
RDW: 14.9 % — ABNORMAL HIGH (ref 11.5–14.6)
WBC: 6.8 10*3/uL (ref 4.5–10.5)

## 2010-06-25 ENCOUNTER — Encounter: Payer: Self-pay | Admitting: Internal Medicine

## 2010-06-25 LAB — CBC WITH DIFFERENTIAL/PLATELET
BASO%: 0.8 % (ref 0.0–2.0)
Basophils Absolute: 0.1 10*3/uL (ref 0.0–0.1)
EOS%: 1.9 % (ref 0.0–7.0)
Eosinophils Absolute: 0.1 10*3/uL (ref 0.0–0.5)
HCT: 34.3 % — ABNORMAL LOW (ref 34.8–46.6)
HGB: 11.6 g/dL (ref 11.6–15.9)
LYMPH%: 18.1 % (ref 14.0–49.7)
MCH: 30.6 pg (ref 25.1–34.0)
MCHC: 33.7 g/dL (ref 31.5–36.0)
MCV: 90.8 fL (ref 79.5–101.0)
MONO#: 0.4 10*3/uL (ref 0.1–0.9)
MONO%: 5.7 % (ref 0.0–14.0)
NEUT#: 5.3 10*3/uL (ref 1.5–6.5)
NEUT%: 73.5 % (ref 38.4–76.8)
Platelets: 234 10*3/uL (ref 145–400)
RBC: 3.78 10*6/uL (ref 3.70–5.45)
RDW: 14.6 % — ABNORMAL HIGH (ref 11.2–14.5)
WBC: 7.2 10*3/uL (ref 3.9–10.3)
lymph#: 1.3 10*3/uL (ref 0.9–3.3)

## 2010-06-25 LAB — COMPREHENSIVE METABOLIC PANEL
ALT: 21 U/L (ref 0–35)
AST: 27 U/L (ref 0–37)
Albumin: 3.9 g/dL (ref 3.5–5.2)
Alkaline Phosphatase: 72 U/L (ref 39–117)
BUN: 16 mg/dL (ref 6–23)
CO2: 24 mEq/L (ref 19–32)
Calcium: 8.9 mg/dL (ref 8.4–10.5)
Chloride: 103 mEq/L (ref 96–112)
Creatinine, Ser: 0.72 mg/dL (ref 0.40–1.20)
Glucose, Bld: 100 mg/dL — ABNORMAL HIGH (ref 70–99)
Potassium: 3.8 mEq/L (ref 3.5–5.3)
Sodium: 139 mEq/L (ref 135–145)
Total Bilirubin: 0.3 mg/dL (ref 0.3–1.2)
Total Protein: 6.7 g/dL (ref 6.0–8.3)

## 2010-06-25 LAB — LACTATE DEHYDROGENASE: LDH: 179 U/L (ref 94–250)

## 2010-06-25 LAB — CEA: CEA: <0.5 ng/mL (ref 0.0–5.0)

## 2010-06-28 ENCOUNTER — Ambulatory Visit
Admission: RE | Admit: 2010-06-28 | Discharge: 2010-06-28 | Payer: Self-pay | Source: Home / Self Care | Attending: Internal Medicine | Admitting: Internal Medicine

## 2010-06-28 DIAGNOSIS — N939 Abnormal uterine and vaginal bleeding, unspecified: Secondary | ICD-10-CM

## 2010-06-28 DIAGNOSIS — M1612 Unilateral primary osteoarthritis, left hip: Secondary | ICD-10-CM | POA: Insufficient documentation

## 2010-06-28 DIAGNOSIS — N926 Irregular menstruation, unspecified: Secondary | ICD-10-CM | POA: Insufficient documentation

## 2010-06-29 ENCOUNTER — Ambulatory Visit: Payer: Self-pay | Admitting: Oncology

## 2010-07-03 ENCOUNTER — Encounter: Payer: Self-pay | Admitting: Internal Medicine

## 2010-07-05 ENCOUNTER — Other Ambulatory Visit: Payer: Self-pay | Admitting: Obstetrics and Gynecology

## 2010-07-05 ENCOUNTER — Ambulatory Visit
Admission: RE | Admit: 2010-07-05 | Discharge: 2010-07-05 | Payer: Self-pay | Source: Home / Self Care | Attending: Obstetrics and Gynecology | Admitting: Obstetrics and Gynecology

## 2010-07-05 ENCOUNTER — Encounter: Payer: Self-pay | Admitting: Internal Medicine

## 2010-07-17 ENCOUNTER — Ambulatory Visit
Admission: RE | Admit: 2010-07-17 | Discharge: 2010-07-17 | Payer: Self-pay | Source: Home / Self Care | Attending: Internal Medicine | Admitting: Internal Medicine

## 2010-07-17 DIAGNOSIS — N8110 Cystocele, unspecified: Secondary | ICD-10-CM

## 2010-07-17 HISTORY — DX: Cystocele, unspecified: N81.10

## 2010-07-17 NOTE — Progress Notes (Signed)
  Phone Note Refill Request Message from:  Fax from Pharmacy on October 11, 2009 4:18 PM  Refills Requested: Medication #1:  ALPRAZOLAM 0.25 MG  TABS Take as needed   Last Refilled: 06/19/2009 please advise refill, fax from [facility] at [address]  Initial call taken by: Ami Bullins CMA,  October 11, 2009 4:19 PM  Follow-up for Phone Call        ok x 5    Prescriptions: ALPRAZOLAM 0.25 MG  TABS (ALPRAZOLAM) Take as needed  #100 x 5   Entered by:   Ami Bullins CMA   Authorized by:   Jacques Navy MD   Signed by:   Bill Salinas CMA on 10/12/2009   Method used:   Telephoned to ...       [facility] [phone]* (retail)       [address]       [place], [place]  [address]       Ph: [phone] or [phone]       Fax: [phone]   RxID:   [id number]

## 2010-07-17 NOTE — Letter (Signed)
Summary: [facility]  [facility]   Imported By: Lester Morgan 02/20/2010 10:05:37  _____________________________________________________________________  External Attachment:    Type:   Image     Comment:   External Document

## 2010-07-17 NOTE — Progress Notes (Signed)
Summary: handicap card  Phone Note Call from Patient Call back at Home Phone [phone]   Caller: Patient Summary of Call: Patient called stating that it is time to have her handicap placard renewed. Please return call with MD advisment thanks.Marland KitchenMarland KitchenAlvy Beal Archie CMA  February 05, 2010 5:13 PM   Follow-up for Phone Call        Patient notified that placard is up front for pick up. Follow-up by: Lucious Groves CMA,  February 06, 2010 8:36 AM

## 2010-07-17 NOTE — Assessment & Plan Note (Signed)
Summary: PER CHECK OUT/SF  Medications Added ALIGN   CAPS (MISC INTESTINAL FLORA REGULAT) once daily      Allergies Added:   Visit Type:  Follow-up Primary Provider:  Illene Regulus, MD  CC:  hypertension.  History of Present Illness: Patient is seen for followup of general cardiology issues.  Her blood pressure is under good control.  She did not have any significant chest pain or shortness of breath.  She remains active.  Current Medications (verified): 1)  Prilosec 20 Mg  Cpdr (Omeprazole) .... Take 1 Tablet By Mouth Once A Day 2)  Synthroid 75 Mcg  Tabs (Levothyroxine Sodium) .... Take 1 Tablet By Mouth Once A Day 3)  Aspirin 81 Mg  Tabs (Aspirin) .... Take One Tablet Daily 4)  Calcium 1200 Mg  Tabs (Calcium Carbonate) .... Once Daily 5)  Gabapentin 600 Mg  Tabs (Gabapentin) .... Take One Tablet At Bedtime 6)  Multivitamins   Tabs (Multiple Vitamin) .... Take One Tablet Daily 7)  Citrucel   Pack (Methylcellulose (Laxative)) .... Take Daily 8)  D 400   Caps (Vitamins A & D) .... Take One Tablet Daily 9)  Miralax   Powd (Polyethylene Glycol 3350) .... Take Daily 10)  Ambien 5 Mg  Tabs (Zolpidem Tartrate) .... Take As Needed For Sleep 11)  Flonase 50 Mcg/act  Susp (Fluticasone Propionate) .... Take As Needed 12)  Tums 500 Mg  Chew (Calcium Carbonate Antacid) .... Take As Needed 13)  Alprazolam 0.25 Mg  Tabs (Alprazolam) .... Take As Needed 14)  Align   Caps (Misc Intestinal Flora Regulat) .... Once Daily 15)  Actonel 150 Mg  Tabs (Risedronate Sodium) .... Once Monthly 16)  Lidocaine-Hydrocortisone Ace 3-0.5 % Rectal Crea (Lidocaine-Hydrocortisone Ace) .... As Needed For Hemorhoids 17)  Nystatin-Triamcinolone 100000-0.1 Unit/gm-%  Crea (Nystatin-Triamcinolone) .... As Directed As Needed 18)  Derma-Smoothe/fs Scalp 0.01 % Oil (Fluocinolone Acetonide) .... Apply To Scalp Let Dwell 4 Hrs or Overnight 19)  Zantac 25 Mg/ml Soln (Ranitidine Hcl) .... As Needed 20)  Beano  Tabs  (Alpha-D-Galactosidase) .... Take 2 Tablets Before Fiber Intake Every Morning.  Allergies (verified): 1)  ! Demerol  Past History:  Past Medical History: CAROTID BRUIT (ICD-785.9)..but no abnormality by Doppler in the past. NEUROPATHY, IDIOPATHIC PERIPHERAL (ICD-356.9) HYPERTENSION (ICD-401.9) GASTROESOPHAGEAL REFLUX DISEASE (ICD-530.81) DIVERTICULOSIS, COLON, HX OF (ICD-V12.79) HYPERLIPIDEMIA (ICD-272.4) SPINAL STENOSIS (ICD-724.00) HYPOTHYROIDISM (ICD-244.9) ADENOCARCINOMA, COLON, HX OF (ICD-V10.05) IRRITABLE BOWEL SYNDROME (564.1) FECAL INCONTINENCE (LAX SPHINCTER POST-OP) MELANOMA   Review of Systems       Patient denies fever, chills, headache, sweats, rash, change in vision, change in hearing, chest pain, cough, nausea vomiting, urinary symptoms.  All of the systems are reviewed and are negative.  Vital Signs:  Patient profile:   77 year old female Height:      60 inches Weight:      103 pounds BMI:     20.19 Pulse rate:   74 / minute BP sitting:   112 / 68  (left arm) Cuff size:   regular  Vitals Entered By: Hardin Negus, RMA (February 06, 2010 1:55 PM)  Physical Exam  General:  patient is stable for her age. Eyes:  no xanthelasma. Neck:  no jugular venous distention. Lungs:  lungs are clear.  Respiratory effort is nonlabored. Heart:  cardiac exam reveals S1-S2.  No clicks or significant murmurs. Abdomen:  abdomen is soft. Extremities:  no peripheral edema. Psych:  patient is oriented to person time and place.  Affect is normal.  Impression & Recommendations:  Problem # 1:  Hx of CAROTID BRUIT (ICD-785.9) The patient does not have any significant bruits today.  Problem # 2:  HYPERTENSION (ICD-401.9)  Her updated medication list for this problem includes:    Aspirin 81 Mg Tabs (Aspirin) .Marland Kitchen... Take one tablet daily Blood pressure is under good control today.  No change in therapy.  Problem # 3:  HYPERLIPIDEMIA (ICD-272.4) Her most recent LDL was 90.   No change in therapy.  Patient Instructions: 1)  Your physician wants you to follow-up in:  1 year.  You will receive a reminder letter in the mail two months in advance. If you don't receive a letter, please call our office to schedule the follow-up appointment.

## 2010-07-17 NOTE — Progress Notes (Signed)
Summary: speak to nurse   Phone Note Call from Patient Call back at Home Phone [phone]   Caller: Patient Call For: Leone Payor Reason for Call: Talk to Nurse Summary of Call: Patient wants to speak directly to nurse. Initial call taken by: Tawni Levy,  December 19, 2009 9:26 AM  Follow-up for Phone Call        Patient  tried creon once and she felt nauseated after trying it.  Pt wanted Dr Leone Payor to know that she wants to just start back on beano and align.  She hasn't taken any Creon  again.  Patient  is advised to try it again at least one more time.  She is asked to call back if she is not able to tolerate it. Follow-up by: Darcey Nora RN, CGRN,  December 19, 2009 10:05 AM

## 2010-07-17 NOTE — Letter (Signed)
[facility]: [facility]   Imported By: Sherian Rein 11/01/2009 09:18:49  _____________________________________________________________________  External Attachment:    Type:   Image     Comment:   External Document

## 2010-07-17 NOTE — Progress Notes (Signed)
Summary: Triage   Phone Note Call from Patient Call back at Home Phone [phone]   Caller: Patient Call For: Dr. Leone Payor Reason for Call: Talk to Nurse Summary of Call: Would like to speak directly to nurse..."embarrassed to discuss other than with nurse" Initial call taken by: Karna Christmas,  March 20, 2010 4:44 PM  Follow-up for Phone Call        phone is busy, I will continue to try and reach the patient Follow-up by: Darcey Nora RN, CGRN,  March 20, 2010 4:54 PM  Additional Follow-up for Phone Call Additional follow up Details #1::        patient c/o fecal incontinence with bloody mucous and discharge.  patient is scheduled to see Dr Leone Payor on 03/23/10 2:15 Additional Follow-up by: Darcey Nora RN, CGRN,  March 21, 2010 11:26 AM

## 2010-07-17 NOTE — Progress Notes (Signed)
Summary: results  Phone Note Call from Patient Call back at Home Phone [phone]   Summary of Call: Patient calling for results and recommendations in regards to her Hg. Please advise. Initial call taken by: Lucious Groves,  December 08, 2009 10:46 AM  Follow-up for Phone Call        Hgb is 12.3 g which is normal!! Nothing additional needs to be done or prescribed.  Follow-up by: Jacques Navy MD,  December 08, 2009 11:46 AM  Additional Follow-up for Phone Call Additional follow up Details #1::        informed pt of results Additional Follow-up by: Ami Bullins CMA,  December 08, 2009 2:14 PM

## 2010-07-17 NOTE — Consult Note (Signed)
Summary: PMB--Dr. Stanford Breed  NAMEFAUSTINA, [name] Small           ACCOUNT NO.:  [id number]      MEDICAL RECORD NO.:  [id number]           PATIENT TYPE:      LOCATION:                                 FACILITY:      PHYSICIAN:  De Blanch, M.D.DATE OF BIRTH:  January 10, 1917      DATE OF CONSULTATION:  12/06/2009   DATE OF DISCHARGE:                                    CONSULTATION      CHIEF COMPLAINT:  Postmenopausal bleeding.      HISTORY OF PRESENT ILLNESS:  A 77 year old white female seen in   consultation at the request of Dr. Eda Paschal regarding recent onset of   vaginal bleeding.  The patient reports that she has been using Estrace   cream for treatment of a prolapsed urethra, prescribed by Dr.   Patsi Sears.  This dates back to December 2010.  Prior to that, she had   used Vagifem 25 mg.  The patient reports that she had a single episode   of heavy vaginal bleeding and was reevaluated by Dr. Eda Paschal with an   ultrasound.  The ultrasound showed a normal-sized postmenopausal uterus   and an endometrial stripe of 1.7 mm.  There were some small   calcifications noted in the uterine fundus.  An attempt at an   endometrial biopsy was performed, however, because of cervical stenosis,   could not be accomplished.  Subsequently, the patient has had an MRI of   the pelvis, which is essentially normal as well.      The patient has no significant past gynecologic history.      SOCIAL HISTORY:  The patient is widowed.  She lives in a retirement   community at [place].      Medical problems include,   1. Osteoporosis.   2. Atrophic vaginitis.   3. Prolapsed urethra.      PHYSICAL EXAMINATION:  VITAL SIGNS:  Weight 104 pounds, height 4 feet 11   inches.   GENERAL:  The patient is a healthy, pleasant, alert, elderly female in   no acute distress.   HEENT:  Negative.   NECK:  Supple without thyromegaly.   ABDOMEN:  Soft, nontender.  No mass, organomegaly, ascites, or  hernias   noted.   PELVIC:  EGBUS is essentially normal.  The urethra is somewhat   prolapsed.  There is no bleeding from the urethra, though.  Vagina is   atrophic.  Cervix is atrophic and stenotic.  Uterus is difficult to   outline, but I believe is small.  There are no adnexal masses noted.      IMPRESSION:  One episode of bleeding.  Given the very thin endometrial   stripe and a normal MRI, I do not believe that she has any endometrial   pathology and therefore would not recommend that she have further   attempts at biopsy or D and C.  Reassured the patient regarding these   findings.  We warned her to contact Dr. Eda Paschal if she   has any further bleeding.  In the  interim, I would suggest that she use   the Estrace cream, applied directly to the urethral prolapse rather than   using a vaginal applicator, which may have caused vaginal tear and   bleeding previously.               De Blanch, M.D.            DC/MEDQ  D:  12/06/2009  T:  12/07/2009  Job:  [id number]      cc:   Reuel Boom L. Eda Paschal, M.D.   Fax: [phone]      Sigmund I. Patsi Sears, M.D.   Fax: [phone]      Telford Nab, R.N.   [address]   [place], [place] [address]      Rosalyn Gess. Norins, MD   [address]   [place]   [place] [address]

## 2010-07-17 NOTE — Letter (Signed)
Summary: [facility]  [facility]   Imported By: Lennie Odor 03/20/2010 15:34:30  _____________________________________________________________________  External Attachment:    Type:   Image     Comment:   External Document

## 2010-07-17 NOTE — Progress Notes (Signed)
  Phone Note Refill Request Message from:  Fax from Pharmacy on April 27, 2010 11:55 AM  Refills Requested: Medication #1:  AMBIEN 5 MG  TABS Take as needed for sleep Fax from [facility] on [address]  Initial call taken by: Ami Bullins CMA,  April 27, 2010 11:56 AM  Follow-up for Phone Call        ok for as needed refills Follow-up by: Jacques Navy MD,  April 27, 2010 1:16 PM    Prescriptions: AMBIEN 5 MG  TABS (ZOLPIDEM TARTRATE) Take as needed for sleep  #30 x 1   Entered by:   Ami Bullins CMA   Authorized by:   Jacques Navy MD   Signed by:   Bill Salinas CMA on 04/27/2010   Method used:   Telephoned to ...       [facility] [phone]* (retail)       [address]       [place], [place]  [address]       Ph: [phone] or [phone]       Fax: [phone]   RxID:   [id number]

## 2010-07-17 NOTE — Assessment & Plan Note (Signed)
Summary: FU/ NWS   Vital Signs:  Patient profile:   77 year old female Height:      60 inches Weight:      107 pounds BMI:     20.97 O2 Sat:      95 % on Room air Temp:     96.8 degrees F oral Pulse rate:   84 / minute BP sitting:   144 / 88  (left arm) Cuff size:   regular  Vitals Entered By: Bill Salinas CMA (March 05, 2010 1:53 PM)  O2 Flow:  Room air CC: ov to discuss weight loss/ ab Comments Pt is on round of doxycycline. She will get flu shot today/ ab   Primary Care Provider:  Illene Regulus, MD  CC:  ov to discuss weight loss/ ab.  History of Present Illness: Pagtinet presents for evaluation of weight loss, she is concerned about rapid swings in her weight. Reviewed chart back to June '09- stays between 103-107 with the majority of readings being 106.  She has atophic vaginitis but had spotting with topical estrogen. She is reconsidering twice a week estrace. She has tried a variety of products. .   Patient c/o prolapsing organ - which is more bothersome. She does have an appointment with Dr. Patsi Sears September 30th for evaluation. If she has prolapse uteri she may benefit from pessary. She also has periodic vaginitis and does use occasional fluconazole cream.   She had a skin biopsy of her left leg and was started on doxycycline for possible infection.   Current Medications (verified): 1)  Prilosec 20 Mg  Cpdr (Omeprazole) .... Take 1 Tablet By Mouth Once A Day 2)  Synthroid 75 Mcg  Tabs (Levothyroxine Sodium) .... Take 1 Tablet By Mouth Once A Day 3)  Aspirin 81 Mg  Tabs (Aspirin) .... Take One Tablet Daily 4)  Calcium 1200 Mg  Tabs (Calcium Carbonate) .... Once Daily 5)  Gabapentin 600 Mg  Tabs (Gabapentin) .... Take One Tablet At Bedtime 6)  Multivitamins   Tabs (Multiple Vitamin) .... Take One Tablet Daily 7)  Citrucel   Pack (Methylcellulose (Laxative)) .... Take Daily 8)  D 400   Caps (Vitamins A & D) .... Take One Tablet Daily 9)  Miralax   Powd  (Polyethylene Glycol 3350) .... Take Daily 10)  Ambien 5 Mg  Tabs (Zolpidem Tartrate) .... Take As Needed For Sleep 11)  Flonase 50 Mcg/act  Susp (Fluticasone Propionate) .... Take As Needed 12)  Tums 500 Mg  Chew (Calcium Carbonate Antacid) .... Take As Needed 13)  Alprazolam 0.25 Mg  Tabs (Alprazolam) .... Take As Needed 14)  Align   Caps (Misc Intestinal Flora Regulat) .... Once Daily 15)  Actonel 150 Mg  Tabs (Risedronate Sodium) .... Once Monthly 16)  Lidocaine-Hydrocortisone Ace 3-0.5 % Rectal Crea (Lidocaine-Hydrocortisone Ace) .... As Needed For Hemorhoids 17)  Nystatin-Triamcinolone 100000-0.1 Unit/gm-%  Crea (Nystatin-Triamcinolone) .... As Directed As Needed 18)  Derma-Smoothe/fs Scalp 0.01 % Oil (Fluocinolone Acetonide) .... Apply To Scalp Let Dwell 4 Hrs or Overnight 19)  Zantac 25 Mg/ml Soln (Ranitidine Hcl) .... As Needed 20)  Beano  Tabs (Alpha-D-Galactosidase) .... Take 2 Tablets Before Fiber Intake Every Morning.  Allergies (verified): 1)  ! Demerol  Past History:  Past Medical History: Last updated: 02/06/2010 CAROTID BRUIT (ICD-785.9)..but no abnormality by Doppler in the past. NEUROPATHY, IDIOPATHIC PERIPHERAL (ICD-356.9) HYPERTENSION (ICD-401.9) GASTROESOPHAGEAL REFLUX DISEASE (ICD-530.81) DIVERTICULOSIS, COLON, HX OF (ICD-V12.79) HYPERLIPIDEMIA (ICD-272.4) SPINAL STENOSIS (ICD-724.00) HYPOTHYROIDISM (ICD-244.9) ADENOCARCINOMA, COLON, HX  OF (ICD-V10.05) IRRITABLE BOWEL SYNDROME (564.1) FECAL INCONTINENCE (LAX SPHINCTER POST-OP) MELANOMA   Past Surgical History: Last updated: 02/25/2007 COLECTOMY, HX OF (ICD-V15.2) POLYPECTOMY, HX OF (ICD-V15.9)  Family History: Last updated: 12/14/2009 non-contributory Family History of Stomach Cancer:grandfather Maternal No FH of Colon Cancer:  Social History: Last updated: 10/25/2008 widowed after a very long and happy marriage.  Lives alone at [place]: paricipates in yoga and exercise.  I-ADLs including  driving. Supportive family who checks on her and visits her regularly. Patient has never smoked.  Alcohol Use - yes  1-2 per week Marina Gravel, MD is nephew  Review of Systems  The patient denies anorexia, fever, hoarseness, chest pain, dyspnea on exertion, prolonged cough, abdominal pain, severe indigestion/heartburn, muscle weakness, difficulty walking, depression, abnormal bleeding, and enlarged lymph nodes.    Physical Exam  General:  well preserved elderly white female in no distress Head:  normocephalic and atraumatic.   Eyes:  C&S clear Lungs:  normal respiratory effort and normal breath sounds.   Heart:  normal rate and regular rhythm.   Neurologic:  alert & oriented X3, cranial nerves II-XII intact, and gait normal.   Skin:  turgor normal and color normal.   Psych:  Oriented X3, memory intact for recent and remote, normally interactive, and good eye contact.     Impression & Recommendations:  Problem # 1:  VAGINITIS, ATROPHIC (ICD-627.3) long standing problem that she is addressing with Dr. Eda Paschal and Dr. Patsi Sears. No recommendations at this time.  Patient does complain of "protruding organ from the vagina." Uterine prolapse vs cystocele with prolapse.  Plan - patient to follow-up with Dr. Patsi Sears  Problem # 2:  WEIGHT LOSS [phone]) Reviewed chart- no significant weight loss documented  Complete Medication List: 1)  Prilosec 20 Mg Cpdr (Omeprazole) .... Take 1 tablet by mouth once a day 2)  Synthroid 75 Mcg Tabs (Levothyroxine sodium) .... Take 1 tablet by mouth once a day 3)  Aspirin 81 Mg Tabs (Aspirin) .... Take one tablet daily 4)  Calcium 1200 Mg Tabs (calcium Carbonate)  .... Once daily 5)  Gabapentin 600 Mg Tabs (Gabapentin) .... Take one tablet at bedtime 6)  Multivitamins Tabs (Multiple vitamin) .... Take one tablet daily 7)  Citrucel Pack (Methylcellulose (laxative)) .... Take daily 8)  D 400 Caps (Vitamins a & d) .... Take one tablet daily 9)   Miralax Powd (Polyethylene glycol 3350) .... Take daily 10)  Ambien 5 Mg Tabs (Zolpidem tartrate) .... Take as needed for sleep 11)  Flonase 50 Mcg/act Susp (Fluticasone propionate) .... Take as needed 12)  Tums 500 Mg Chew (Calcium carbonate antacid) .... Take as needed 13)  Alprazolam 0.25 Mg Tabs (Alprazolam) .... Take as needed 14)  Align Caps (Misc intestinal flora regulat) .... Once daily 15)  Actonel 150 Mg Tabs (Risedronate sodium) .... Once monthly 16)  Lidocaine-hydrocortisone Ace 3-0.5 % Rectal Crea (Lidocaine-hydrocortisone ace) .... As needed for hemorhoids 17)  Nystatin-triamcinolone 100000-0.1 Unit/gm-% Crea (Nystatin-triamcinolone) .... As directed as needed 18)  Derma-smoothe/fs Scalp 0.01 % Oil (Fluocinolone acetonide) .... Apply to scalp let dwell 4 hrs or overnight 19)  Zantac 25 Mg/ml Soln (Ranitidine hcl) .... As needed 20)  Beano Tabs (Alpha-d-galactosidase) .... Take 2 tablets before fiber intake every morning.  Other Orders: Flu Vaccine 42yrs + MEDICARE PATIENTS (F6213) Administration Flu vaccine - MCR (Y8657) Prescriptions: ALPRAZOLAM 0.25 MG  TABS (ALPRAZOLAM) Take as needed  #100 x 3   Entered and Authorized by:   Jacques Navy  MD   Signed by:   Jacques Navy MD on 03/05/2010   Method used:   Handwritten   RxID:   [id number] FLONASE 50 MCG/ACT  SUSP (FLUTICASONE PROPIONATE) Take as needed  #1 x 3   Entered and Authorized by:   Jacques Navy MD   Signed by:   Jacques Navy MD on 03/05/2010   Method used:   Electronically to        [facility] [phone]* (retail)       [address]       [place], [place]  [address]       Ph: [phone] or [phone]       Fax: [phone]   RxID:   [id number] SYNTHROID 75 MCG  TABS (LEVOTHYROXINE SODIUM) Take 1 tablet by mouth once a day  #30 Not Speci x 12   Entered and Authorized by:   Jacques Navy MD   Signed by:   Jacques Navy MD on 03/05/2010   Method used:    Electronically to        [facility] [phone]* (retail)       [address]       [place], [place]  [address]       Ph: [phone] or [phone]       Fax: [phone]   RxID:   [id number]    Flu Vaccine Consent Questions     Do you have a history of severe allergic reactions to this vaccine? no    Any prior history of allergic reactions to egg and/or gelatin? no    Do you have a sensitivity to the preservative Thimersol? no    Do you have a past history of Guillan-Barre Syndrome? no    Do you currently have an acute febrile illness? no    Have you ever had a severe reaction to latex? no    Vaccine information given and explained to patient? yes    Are you currently pregnant? no    Lot Number:[id number]   Exp Date:12/15/2010   Site Given  Left Deltoid IMedflu

## 2010-07-17 NOTE — Letter (Signed)
[facility]   Imported By: Sherian Rein 07/21/2009 07:53:04  _____________________________________________________________________  External Attachment:    Type:   Image     Comment:   External Document

## 2010-07-17 NOTE — Assessment & Plan Note (Signed)
Summary: PER PT INSIDE WEARING OUT---STC   Vital Signs:  Patient profile:   77 year old female Height:      60 inches Weight:      106 pounds BMI:     20.78 O2 Sat:      97 % on Room air Temp:     97.7 degrees F oral Pulse rate:   68 / minute BP sitting:   136 / 82  (left arm) Cuff size:   regular  Vitals Entered By: Bill Salinas CMA (Oct 19, 2009 2:02 PM)  O2 Flow:  Room air  Primary Care Provider:  Illene Regulus, MD   History of Present Illness: [name] Small presents to have follow-up lab work. At her visit to Dr. Cyndie Chime in January she had a minimally elevated SGOT and is to have repeat liver functions to monitor this abnormality.  Recently she was started on topical hormone therapy for atrophic vaginitis with dysuria by Dr. Patsi Sears. She was using Estrace cream daily and developed some irritation externally and was changed to estrace cream application twice a week. Subsequently she saw Dr. Eda Paschal and was diagnosed with yeast vaginitis for which ketoconazole was prescribed. She became concerned about the listed potential adverse effects and stopped the medication after 4 days.   Just recently she had an episode of vaginal bleeding. She was already scheduled to see Dr. Patsi Sears. Her description is vague but it sounds like he diagnosed prolapse of the uterus. She is scheduled for pelvic U/S May 6th, Friday with follow up by Dr. Eda Paschal.  She otherwise is doing fine - remaining active and independent.   Current Medications (verified): 1)  Prilosec 20 Mg  Cpdr (Omeprazole) .... Take 1 Tablet By Mouth Once A Day 2)  Synthroid 75 Mcg  Tabs (Levothyroxine Sodium) .... Take 1 Tablet By Mouth Once A Day 3)  Aspirin 81 Mg  Tabs (Aspirin) .... Take One Tablet Daily 4)  Calcium 1200 Mg  Tabs (Calcium Carbonate) .... Once Daily 5)  Gabapentin 600 Mg  Tabs (Gabapentin) .... Take One Tablet At Bedtime 6)  Multivitamins   Tabs (Multiple Vitamin) .... Take One Tablet Daily 7)   Citrucel   Pack (Methylcellulose (Laxative)) .... Take Daily 8)  D 400   Caps (Vitamins A & D) .... Take One Tablet Daily 9)  Miralax   Powd (Polyethylene Glycol 3350) .... Take Daily 10)  Ambien 5 Mg  Tabs (Zolpidem Tartrate) .... Take As Needed For Sleep 11)  Flonase 50 Mcg/act  Susp (Fluticasone Propionate) .... Take As Needed 12)  Tums 500 Mg  Chew (Calcium Carbonate Antacid) .... Take As Needed 13)  Alprazolam 0.25 Mg  Tabs (Alprazolam) .... Take As Needed 14)  Align   Caps (Misc Intestinal Flora Regulat) .... Once Daily 15)  Actonel 150 Mg  Tabs (Risedronate Sodium) .... Once Monthly 16)  Lidocaine-Hydrocortisone Ace 3-0.5 % Rectal Crea (Lidocaine-Hydrocortisone Ace) .... As Needed For Hemorhoids 17)  Vagifem 25 Mcg Tabs (Estradiol) .... Vaginally 2/wk 18)  Nystatin-Triamcinolone 100000-0.1 Unit/gm-%  Crea (Nystatin-Triamcinolone) .... As Directed As Needed 19)  Derma-Smoothe/fs Scalp 0.01 % Oil (Fluocinolone Acetonide) .... Apply To Scalp Let Dwell 4 Hrs or Overnight 20)  Flonase 50 Mcg/act Susp (Fluticasone Propionate) .... As Needed 21)  Zantac 25 Mg/ml Soln (Ranitidine Hcl) .... As Needed  Allergies (verified): 1)  ! Demerol  Past History:  Past Medical History: Last updated: 02/07/2009 CAROTID BRUIT (ICD-785.9)..but no abnormality by Doppler in the past. NEUROPATHY, IDIOPATHIC PERIPHERAL (ICD-356.9)  HYPERTENSION (ICD-401.9) GASTROESOPHAGEAL REFLUX DISEASE (ICD-530.81) DIVERTICULOSIS, COLON, HX OF (ICD-V12.79) HYPERLIPIDEMIA (ICD-272.4) SPINAL STENOSIS (ICD-724.00) HYPOTHYROIDISM (ICD-244.9) ADENOCARCINOMA, COLON, HX OF (ICD-V10.05) IRRITABLE BOWEL SYNDROME (564.1) FECAL INCONTINENCE (LAX SPHINCTER POST-OP) MELANOMA   Past Surgical History: Last updated: 02/25/2007 COLECTOMY, HX OF (ICD-V15.2) POLYPECTOMY, HX OF (ICD-V15.9)  Family History: Last updated: 10/25/2008 non-contributory Family History of Stomach Cancer:grandfather Maternal  Social History: Last  updated: 10/25/2008 widowed after a very long and happy marriage.  Lives alone at [place]: paricipates in yoga and exercise.  I-ADLs including driving. Supportive family who checks on her and visits her regularly. Patient has never smoked.  Alcohol Use - yes  1-2 per week Marina Gravel, MD is nephew  Risk Factors: Smoking Status: never (10/25/2008)  Review of Systems       The patient complains of abnormal bleeding.  The patient denies anorexia, weight loss, decreased hearing, dyspnea on exertion, prolonged cough, headaches, abdominal pain, hematochezia, muscle weakness, difficulty walking, depression, and enlarged lymph nodes.    Physical Exam  General:  slender elderly white female in no distress Head:  normocephalic and atraumatic.   Eyes:  pupils equal, pupils round, corneas and lenses clear, and no injection.   Mouth:  good dentition, dry mucus membranes Neck:  supple.   Lungs:  normal respiratory effort and normal breath sounds.   Heart:  normal rate and regular rhythm.   Abdomen:  soft and normal bowel sounds.   Msk:  normal ROM, no joint swelling, and no redness over joints.   Pulses:  2+ radial Neurologic:  alert & oriented X3, cranial nerves II-XII intact, and gait normal.   Skin:  poor skin turgor, normal color. Psych:  Oriented X3, memory intact for recent and remote, normally interactive, and good eye contact.     Impression & Recommendations:  Problem # 1:  ANEMIA, NORMOCYTIC, CHRONIC (ICD-285.9) Chronic mild anemia.  Plan - follow -up CBC  Orders: TLB-CBC Platelet - w/Differential (85025-CBCD)  Addendum - Hgb 11.8 - at her baseline. No additional treatment or testing indicated  Problem # 2:  VAGINITIS, ATROPHIC (ICD-627.3) See HPI. Patient reassured about the safety of estrace topical cream and the clear cut benefit in regard to comfort.  Her updated medication list for this problem includes:    Vagifem 25 Mcg Tabs (Estradiol) .Marland Kitchen... Vaginally  2/wk  Problem # 3:  CANDIDIASIS, VAGINAL (ICD-112.1) Patient had been using Ketoconazole but stopped at 4 days. She is reassured about the safety of short term use of this drug and encouraged her to discuss the issue with Dr. Eda Paschal at her visit Friday.  Her updated medication list for this problem includes:    Nystatin-triamcinolone 100000-0.1 Unit/gm-% Crea (Nystatin-triamcinolone) .Marland Kitchen... As directed as needed  Problem # 4:  HYPERLIPIDEMIA (ICD-272.4) Will order follow-up Lipid panel for today  Orders: TLB-Lipid Panel (80061-LIPID) TLB-Hepatic/Liver Function Pnl (80076-HEPATIC)  Addendum - lipid panel reveals excellent control.  Problem # 5:  HYPERTENSION (ICD-401.9)  BP today: 136/82 Prior BP: 126/70 (05/29/2009)  Good control - will continue present regimen  Problem # 6:  TRANSAMINASES, SERUM, ELEVATED (ICD-790.4) Reviewed labs from [facility] - she has an AST that will run from 30-46. Most recently her AST was 39. She will have follow-up lab.  Addendum - AST 40. This very minimal elevation is of no consequence and does not require any change in medications or further evaluation.   Problem # 7:  VAGINAL BLEEDING (ICD-623.8) No exam today. No note from Dr. Patsi Sears yet. Will defer to  Drs. Patsi Sears and Gottsegen on this issue.  Complete Medication List: 1)  Prilosec 20 Mg Cpdr (Omeprazole) .... Take 1 tablet by mouth once a day 2)  Synthroid 75 Mcg Tabs (Levothyroxine sodium) .... Take 1 tablet by mouth once a day 3)  Aspirin 81 Mg Tabs (Aspirin) .... Take one tablet daily 4)  Calcium 1200 Mg Tabs (calcium Carbonate)  .... Once daily 5)  Gabapentin 600 Mg Tabs (Gabapentin) .... Take one tablet at bedtime 6)  Multivitamins Tabs (Multiple vitamin) .... Take one tablet daily 7)  Citrucel Pack (Methylcellulose (laxative)) .... Take daily 8)  D 400 Caps (Vitamins a & d) .... Take one tablet daily 9)  Miralax Powd (Polyethylene glycol 3350) .... Take daily 10)  Ambien 5 Mg Tabs  (Zolpidem tartrate) .... Take as needed for sleep 11)  Flonase 50 Mcg/act Susp (Fluticasone propionate) .... Take as needed 12)  Tums 500 Mg Chew (Calcium carbonate antacid) .... Take as needed 13)  Alprazolam 0.25 Mg Tabs (Alprazolam) .... Take as needed 14)  Align Caps (Misc intestinal flora regulat) .... Once daily 15)  Actonel 150 Mg Tabs (Risedronate sodium) .... Once monthly 16)  Lidocaine-hydrocortisone Ace 3-0.5 % Rectal Crea (Lidocaine-hydrocortisone ace) .... As needed for hemorhoids 17)  Vagifem 25 Mcg Tabs (Estradiol) .... Vaginally 2/wk 18)  Nystatin-triamcinolone 100000-0.1 Unit/gm-% Crea (Nystatin-triamcinolone) .... As directed as needed 19)  Derma-smoothe/fs Scalp 0.01 % Oil (Fluocinolone acetonide) .... Apply to scalp let dwell 4 hrs or overnight 20)  Flonase 50 Mcg/act Susp (Fluticasone propionate) .... As needed 21)  Zantac 25 Mg/ml Soln (Ranitidine hcl) .... As needed  Patient: [facility] Note: All result statuses are Final unless otherwise noted.  Tests: (1) CBC Platelet w/Diff (CBCD)   White Cell Count          5.0 K/uL                    4.5-10.5   Red Cell Count       [L]  3.67 Mil/uL                 3.87-5.11   Hemoglobin           [L]  11.8 g/dL                   16.1-09.6   Hematocrit           [L]  34.2 %                      36.0-46.0   MCV                       93.2 fl                     78.0-100.0   MCHC                      34.5 g/dL                   04.5-40.9   RDW                       13.7 %                      11.5-14.6   Platelet Count            179.0 K/uL  150.0-400.0   Neutrophil %              66.5 %                      43.0-77.0   Lymphocyte %              21.7 %                      12.0-46.0   Monocyte %                8.3 %                       3.0-12.0   Eosinophils%              3.1 %                       0.0-5.0   Basophils %               0.4 %                       0.0-3.0   Neutrophill Absolute      3.3  K/uL                    1.4-7.7   Lymphocyte Absolute       1.1 K/uL                    0.7-4.0   Monocyte Absolute         0.4 K/uL                    0.1-1.0  Eosinophils, Absolute                             0.2 K/uL                    0.0-0.7   Basophils Absolute        0.0 K/uL                    0.0-0.1  Tests: (2) Lipid Panel (LIPID)   Cholesterol               176 mg/dL                   1-478     ATP III Classification            Desirable:  < 200 mg/dL                    Borderline High:  200 - 239 mg/dL               High:  > = 240 mg/dL   Triglycerides        [H]  177.0 mg/dL                 2.9-562.1     Normal:  <150 mg/dL     Borderline High:  308 - 199 mg/dL   HDL                       65.78 mg/dL                 >  39.00   VLDL Cholesterol          35.4 mg/dL                  4.0-10.2   LDL Cholesterol           90 mg/dL                    7-25  CHO/HDL Ratio:  CHD Risk                             3                    Men          Women     1/2 Average Risk     3.4          3.3     Average Risk          5.0          4.4     2X Average Risk          9.6          7.1     3X Average Risk          15.0          11.0                           Tests: (3) Hepatic/Liver Function Panel (HEPATIC)   Total Bilirubin           0.3 mg/dL                   3.6-6.4   Direct Bilirubin          0.0 mg/dL                   4.0-3.4   Alkaline Phosphatase      94 U/L                      39-117   AST                  [H]  40 U/L                      0-37   ALT                       25 U/L                      0-35   Albumin - A               3.5 g/dL                    7.4-2.5   Total Protein - A         6.3 g/dL                    9.5-6.3

## 2010-07-17 NOTE — Assessment & Plan Note (Signed)
Summary: FU---PER  PT---STC   Vital Signs:  Patient profile:   77 year old female Height:      60 inches Weight:      104 pounds BMI:     20.38 O2 Sat:      96 % on Room air Temp:     97.1 degrees F oral Pulse rate:   82 / minute BP sitting:   128 / 70  (left arm) Cuff size:   regular  Vitals Entered By: Bill Salinas CMA (Nov 03, 2009 3:35 PM)  O2 Flow:  Room air CC: follow-up visit/ ab   Primary Care Provider:  Illene Regulus, MD  CC:  follow-up visit/ ab.  History of Present Illness: Patient is here to discuss previous lab work and the work up on her vaginal bleeding.  She has been seen by both Dr. Eda Paschal and Dr. Patsi Sears for follow-up on her episode of vaginal bleeding.  Out of concern for medication side effects she stopped her aspirin, estrace cream, vagifem, and ketoconazole.  Dr. Eda Paschal did multiple imaging studies including a trans-vaginal ultrasound and an MRI which, per patient, showed a fibroid but no anatomical explanation for her bleeding or concern for malignancy.  He has encouraged a second opinion with [facility], which is currently being scheduled.  Dr. Patsi Sears, per patient report, is concerned about urethral prolapse and thinks that vagifem and estrace therapy are required to prevent worsening of urethral symptoms.    Patient is also concerned about her low hemoglobin on labwork (11.8 on 10/19/09).  She has no symptoms of weakness, fatigue or lightheadedness.    Current Medications (verified): 1)  Prilosec 20 Mg  Cpdr (Omeprazole) .... Take 1 Tablet By Mouth Once A Day 2)  Synthroid 75 Mcg  Tabs (Levothyroxine Sodium) .... Take 1 Tablet By Mouth Once A Day 3)  Aspirin 81 Mg  Tabs (Aspirin) .... Take One Tablet Daily 4)  Calcium 1200 Mg  Tabs (Calcium Carbonate) .... Once Daily 5)  Gabapentin 600 Mg  Tabs (Gabapentin) .... Take One Tablet At Bedtime 6)  Multivitamins   Tabs (Multiple Vitamin) .... Take One Tablet Daily 7)  Citrucel   Pack  (Methylcellulose (Laxative)) .... Take Daily 8)  D 400   Caps (Vitamins A & D) .... Take One Tablet Daily 9)  Miralax   Powd (Polyethylene Glycol 3350) .... Take Daily 10)  Ambien 5 Mg  Tabs (Zolpidem Tartrate) .... Take As Needed For Sleep 11)  Flonase 50 Mcg/act  Susp (Fluticasone Propionate) .... Take As Needed 12)  Tums 500 Mg  Chew (Calcium Carbonate Antacid) .... Take As Needed 13)  Alprazolam 0.25 Mg  Tabs (Alprazolam) .... Take As Needed 14)  Align   Caps (Misc Intestinal Flora Regulat) .... Once Daily 15)  Actonel 150 Mg  Tabs (Risedronate Sodium) .... Once Monthly 16)  Lidocaine-Hydrocortisone Ace 3-0.5 % Rectal Crea (Lidocaine-Hydrocortisone Ace) .... As Needed For Hemorhoids 17)  Vagifem 25 Mcg Tabs (Estradiol) .... Vaginally 2/wk 18)  Nystatin-Triamcinolone 100000-0.1 Unit/gm-%  Crea (Nystatin-Triamcinolone) .... As Directed As Needed 19)  Derma-Smoothe/fs Scalp 0.01 % Oil (Fluocinolone Acetonide) .... Apply To Scalp Let Dwell 4 Hrs or Overnight 20)  Flonase 50 Mcg/act Susp (Fluticasone Propionate) .... As Needed 21)  Zantac 25 Mg/ml Soln (Ranitidine Hcl) .... As Needed  Allergies (verified): 1)  ! Demerol  Past History:  Past Medical History: Last updated: 02/07/2009 CAROTID BRUIT (ICD-785.9)..but no abnormality by Doppler in the past. NEUROPATHY, IDIOPATHIC PERIPHERAL (ICD-356.9) HYPERTENSION (ICD-401.9) GASTROESOPHAGEAL  REFLUX DISEASE (ICD-530.81) DIVERTICULOSIS, COLON, HX OF (ICD-V12.79) HYPERLIPIDEMIA (ICD-272.4) SPINAL STENOSIS (ICD-724.00) HYPOTHYROIDISM (ICD-244.9) ADENOCARCINOMA, COLON, HX OF (ICD-V10.05) IRRITABLE BOWEL SYNDROME (564.1) FECAL INCONTINENCE (LAX SPHINCTER POST-OP) MELANOMA   Past Surgical History: Last updated: 02/25/2007 COLECTOMY, HX OF (ICD-V15.2) POLYPECTOMY, HX OF (ICD-V15.9)  Family History: Last updated: 10/25/2008 non-contributory Family History of Stomach Cancer:grandfather Maternal  Social History: Last updated:  10/25/2008 widowed after a very long and happy marriage.  Lives alone at [place]: paricipates in yoga and exercise.  I-ADLs including driving. Supportive family who checks on her and visits her regularly. Patient has never smoked.  Alcohol Use - yes  1-2 per week Marina Gravel, MD is nephew  Review of Systems       reports continued problems with constipation and diarrhea.  Physical Exam  General:  alert and well-developed.   Head:  normocephalic and atraumatic.   Eyes:  vision grossly intact, pupils equal, pupils round, pupils reactive to light, and no injection.   Ears:  no external deformities.   Nose:  no external deformity.   Mouth:  pharynx pink and moist.   Lungs:  normal respiratory effort, no accessory muscle use, normal breath sounds, no crackles, and no wheezes.   Heart:  normal rate, regular rhythm, no murmur, no gallop, and no rub.   Abdomen:  soft, non-tender, no distention, no masses, no guarding, no rigidity, no rebound tenderness, no hepatomegaly, no splenomegaly, and bowel sounds hyperactive.   Msk:  normal ROM, no joint tenderness, no joint swelling, no joint warmth, and no redness over joints.   Pulses:  R radial normal and L radial normal.   Extremities:  no pedal edema   Impression & Recommendations:  Problem # 1:  VAGINAL BLEEDING (ICD-623.8) Patient has not had any more episodes of bleeding since her last visit but is concerned about the cause and possibility of recurrance of bleeding.  She has had a very thorough work up for causes of the bleeding by Dr. Eda Paschal and Dr. Patsi Sears.    Plan- Defer plan for vagifem or estrace therapy to Drs. Tannenbaum and [employer].          Patient advised that it is safe for her to resume Aspirin81mg  daily.    Problem # 2:  ANEMIA, NORMOCYTIC, CHRONIC (ICD-285.9) Patient is concerned about her low hemoglobin reading on recent labwork.  Her baseline Hgb is around 13 based on previous labwork.  She has had no known GI  bleeding.  She had a virtual colonoscopy three years ago.  Plan- Patient is reassured that anemia at this level is not likely to be symptomatic.          Because Hgb is lower than baseline will follow-up with a CBC in six weeks to monitor for stabilization or improvement in Hgb.    Complete Medication List: 1)  Prilosec 20 Mg Cpdr (Omeprazole) .... Take 1 tablet by mouth once a day 2)  Synthroid 75 Mcg Tabs (Levothyroxine sodium) .... Take 1 tablet by mouth once a day 3)  Aspirin 81 Mg Tabs (Aspirin) .... Take one tablet daily 4)  Calcium 1200 Mg Tabs (calcium Carbonate)  .... Once daily 5)  Gabapentin 600 Mg Tabs (Gabapentin) .... Take one tablet at bedtime 6)  Multivitamins Tabs (Multiple vitamin) .... Take one tablet daily 7)  Citrucel Pack (Methylcellulose (laxative)) .... Take daily 8)  D 400 Caps (Vitamins a & d) .... Take one tablet daily 9)  Miralax Powd (Polyethylene glycol 3350) .... Take daily  10)  Ambien 5 Mg Tabs (Zolpidem tartrate) .... Take as needed for sleep 11)  Flonase 50 Mcg/act Susp (Fluticasone propionate) .... Take as needed 12)  Tums 500 Mg Chew (Calcium carbonate antacid) .... Take as needed 13)  Alprazolam 0.25 Mg Tabs (Alprazolam) .... Take as needed 14)  Align Caps (Misc intestinal flora regulat) .... Once daily 15)  Actonel 150 Mg Tabs (Risedronate sodium) .... Once monthly 16)  Lidocaine-hydrocortisone Ace 3-0.5 % Rectal Crea (Lidocaine-hydrocortisone ace) .... As needed for hemorhoids 17)  Vagifem 25 Mcg Tabs (Estradiol) .... Vaginally 2/wk 18)  Nystatin-triamcinolone 100000-0.1 Unit/gm-% Crea (Nystatin-triamcinolone) .... As directed as needed 19)  Derma-smoothe/fs Scalp 0.01 % Oil (Fluocinolone acetonide) .... Apply to scalp let dwell 4 hrs or overnight 20)  Flonase 50 Mcg/act Susp (Fluticasone propionate) .... As needed 21)  Zantac 25 Mg/ml Soln (Ranitidine hcl) .... As needed

## 2010-07-17 NOTE — Progress Notes (Signed)
Summary: samples returned   Phone Note Call from Patient Call back at Home Phone [phone]   Caller: Patient Call For: Dr. Leone Payor Reason for Call: Talk to Nurse Summary of Call: would like to return extra samples of Creon... Creon "did not agree" with pt Initial call taken by: Vallarie Mare,  December 29, 2009 3:44 PM  Follow-up for Phone Call        Pt states creon made her gas louder and worse.  She would like to return an unopened box of Creon.  She has tried [employer], academic and [employer].  She is trying to eat "the right foods", but has problems balancing it out to prevent constipation.  She states Beano also helps her.  She would rather try to eat better to control gas than using meds. Pt is instructed to call our office if symptoms worsen off of meds.  She is agreeable. Follow-up by: Francee Piccolo CMA Duncan Dull),  December 29, 2009 4:19 PM

## 2010-07-17 NOTE — Progress Notes (Signed)
Summary: triage   Phone Note Call from Patient Call back at Home Phone [phone]   Caller: Patient Call For: Dr. Leone Payor Reason for Call: Talk to Nurse Summary of Call: would like to be seen sooner than sch'ed... rectal bleeding has increased in frequency per pt Initial call taken by: Vallarie Mare,  December 05, 2009 10:23 AM  Follow-up for Phone Call        Left message for patient to call back Darcey Nora RN, [facility]  December 05, 2009 10:31 AM  REV scheduled for 12/14/09 10:15.  Patient with an increase in rectal bleeding.    Follow-up by: Darcey Nora RN, CGRN,  December 05, 2009 3:48 PM

## 2010-07-17 NOTE — Progress Notes (Signed)
Summary: blood in bm's Bright red  Medications Added LIDOCAINE-HYDROCORTISONE ACE 3-0.5 % RECTAL CREA (LIDOCAINE-HYDROCORTISONE ACE) as needed for hemorhoids       Phone Note Call from Patient Call back at [facility] Phone [phone]   Call For: Dr Leone Payor Summary of Call: Is bleeding rectally-with her bm's started yesterday night. And this morning she had gas and saw blood too. Initial call taken by: Leanor Kail [facility],  January 03, 2010 9:38 AM  Follow-up for Phone Call        patient says no more bleeding today, had bright red blood last night.  Patient is advised to use her hydrocortisone cream nightly for 7-10 days.  I have advised patient to be on a stool softener and increase the amount of water in her diet.  She has requested a refill on hydrocortisone cream. Follow-up by: Darcey Nora RN, CGRN,  January 03, 2010 10:04 AM    New/Updated Medications: LIDOCAINE-HYDROCORTISONE ACE 3-0.5 % RECTAL CREA (LIDOCAINE-HYDROCORTISONE ACE) as needed for hemorhoids Prescriptions: LIDOCAINE-HYDROCORTISONE ACE 3-0.5 % RECTAL CREA (LIDOCAINE-HYDROCORTISONE ACE) as needed for hemorhoids  #30 x 1   Entered by:   Darcey Nora RN, CGRN   Authorized by:   Iva Boop MD, [facility]   Signed by:   Darcey Nora RN, CGRN on 01/03/2010   Method used:   Electronically to        [facility]  [phone]* (retail)       [address] [place], [place]  [address]       Ph: [phone] or [phone]       Fax: [phone]   RxID:   [id number]

## 2010-07-17 NOTE — Progress Notes (Signed)
  Phone Note Refill Request   pt requesting refill on Anusol cream sent to [facility], please advise  Initial call taken by: Ami Bullins CMA,  March 21, 2010 10:51 AM  Follow-up for Phone Call        OK for anusol [facility] 2.5% cream 30g tube, apply three times a day for hemorrhoids. Follow-up by: Jacques Navy MD,  March 21, 2010 11:06 AM    New/Updated Medications: ANUSOL-HC 2.5 % CREA (HYDROCORTISONE) apply three times a day for hemorrhoids Prescriptions: ANUSOL-HC 2.5 % CREA (HYDROCORTISONE) apply three times a day for hemorrhoids  #30 g tube x 1   Entered by:   Ami Bullins CMA   Authorized by:   Jacques Navy MD   Signed by:   Bill Salinas CMA on 03/21/2010   Method used:   Electronically to        [facility]* (retail)       [address]       [place], [place]  [id number]       Ph: [phone]       Fax: [phone]   RxID:   [id number]

## 2010-07-17 NOTE — Progress Notes (Signed)
Summary: lab request  Phone Note Call from Patient Call back at Home Phone [phone]   Summary of Call: Patient left message on triage that she had elevated blood tests in Jan. and has recently seen Dr. Eda Paschal and Marlena Clipper. Patient is requesting to have blood work repeated to check her liver. Please advise. Initial call taken by: Lucious Groves,  Oct 18, 2009 9:11 AM  Follow-up for Phone Call        reviewed chart - lab in january revealed an sgot 5 points above normal which is essentially normal. OK to order hepatic panel 790.6, metabolic panel 401.9 Follow-up by: Jacques Navy MD,  Oct 18, 2009 3:08 PM  Additional Follow-up for Phone Call Additional follow up Details #1::        Spoke w/pt, she is already scheduled for office visit today  Additional Follow-up by: Lamar Sprinkles, CMA,  Oct 19, 2009 9:49 AM

## 2010-07-17 NOTE — Assessment & Plan Note (Signed)
Summary: rectal bleeding/sheri    History of Present Illness Visit Type: Follow-up Visit Primary GI MD: Stan Head MD [facility] Primary Provider: Illene Regulus, MD Chief Complaint: rectal bleeding History of Present Illness:   She had some bulky stools and experienced some rectal bleeding with blood into toilet x few days at most. This has resolved. she feels gasy and that stools are more malodorous. She does eat high fiber. Beano does seem to help. "It is expensive". Saw Dr. Debby Bud and Hgb normal Also had vaginal bleeding and work-up by Dr. Eda Paschal. She also saw GYN-ON[facility] and no further testing recommended.    GI Review of Systems      Denies abdominal pain, acid reflux, belching, bloating, chest pain, dysphagia with liquids, dysphagia with solids, heartburn, loss of appetite, nausea, vomiting, vomiting blood, weight loss, and  weight gain.      Reports hemorrhoids and  rectal bleeding.     Denies anal fissure, black tarry stools, change in bowel habit, constipation, diarrhea, diverticulosis, fecal incontinence, heme positive stool, irritable bowel syndrome, jaundice, light color stool, liver problems, and  rectal pain.    Current Medications (verified): 1)  Prilosec 20 Mg  Cpdr (Omeprazole) .... Take 1 Tablet By Mouth Once A Day 2)  Synthroid 75 Mcg  Tabs (Levothyroxine Sodium) .... Take 1 Tablet By Mouth Once A Day 3)  Aspirin 81 Mg  Tabs (Aspirin) .... Take One Tablet Daily 4)  Calcium 1200 Mg  Tabs (Calcium Carbonate) .... Once Daily 5)  Gabapentin 600 Mg  Tabs (Gabapentin) .... Take One Tablet At Bedtime 6)  Multivitamins   Tabs (Multiple Vitamin) .... Take One Tablet Daily 7)  Citrucel   Pack (Methylcellulose (Laxative)) .... Take Daily 8)  D 400   Caps (Vitamins A & D) .... Take One Tablet Daily 9)  Miralax   Powd (Polyethylene Glycol 3350) .... Take Daily 10)  Ambien 5 Mg  Tabs (Zolpidem Tartrate) .... Take As Needed For Sleep 11)  Flonase 50 Mcg/act  Susp (Fluticasone  Propionate) .... Take As Needed 12)  Tums 500 Mg  Chew (Calcium Carbonate Antacid) .... Take As Needed 13)  Alprazolam 0.25 Mg  Tabs (Alprazolam) .... Take As Needed 14)  Align   Caps (Misc Intestinal Flora Regulat) .... Once Daily 15)  Actonel 150 Mg  Tabs (Risedronate Sodium) .... Once Monthly 16)  Lidocaine-Hydrocortisone Ace 3-0.5 % Rectal Crea (Lidocaine-Hydrocortisone Ace) .... As Needed For Hemorhoids 17)  Nystatin-Triamcinolone 100000-0.1 Unit/gm-%  Crea (Nystatin-Triamcinolone) .... As Directed As Needed 18)  Derma-Smoothe/fs Scalp 0.01 % Oil (Fluocinolone Acetonide) .... Apply To Scalp Let Dwell 4 Hrs or Overnight 19)  Flonase 50 Mcg/act Susp (Fluticasone Propionate) .... As Needed 20)  Zantac 25 Mg/ml Soln (Ranitidine Hcl) .... As Needed 21)  Estrace 0.1 Mg/gm Crea (Estradiol) .... Use Two Times Per Week 22)  Beano  Tabs (Alpha-D-Galactosidase) .... Take 2 Tablets Before Fiber Intake Every Morning.  Allergies (verified): 1)  ! Demerol  Past History:  Past Medical History: Reviewed history from 02/07/2009 and no changes required. CAROTID BRUIT (ICD-785.9)..but no abnormality by Doppler in the past. NEUROPATHY, IDIOPATHIC PERIPHERAL (ICD-356.9) HYPERTENSION (ICD-401.9) GASTROESOPHAGEAL REFLUX DISEASE (ICD-530.81) DIVERTICULOSIS, COLON, HX OF (ICD-V12.79) HYPERLIPIDEMIA (ICD-272.4) SPINAL STENOSIS (ICD-724.00) HYPOTHYROIDISM (ICD-244.9) ADENOCARCINOMA, COLON, HX OF (ICD-V10.05) IRRITABLE BOWEL SYNDROME (564.1) FECAL INCONTINENCE (LAX SPHINCTER POST-OP) MELANOMA   Past Surgical History: Reviewed history from 02/25/2007 and no changes required. COLECTOMY, HX OF (ICD-V15.2) POLYPECTOMY, HX OF (ICD-V15.9)  Family History: Reviewed history from 10/25/2008 and no changes  required. non-contributory Family History of Stomach Cancer:grandfather Maternal No FH of Colon Cancer:  Social History: Reviewed history from 10/25/2008 and no changes required. widowed after a very  long and happy marriage.  Lives alone at [place]: paricipates in yoga and exercise.  I-ADLs including driving. Supportive family who checks on her and visits her regularly. Patient has never smoked.  Alcohol Use - yes  1-2 per week Marina Gravel, MD is nephew  Vital Signs:  Patient profile:   77 year old female Height:      60 inches Weight:      105.38 pounds BMI:     20.66 Pulse rate:   80 / minute Pulse rhythm:   regular BP sitting:   118 / 66  (left arm)  Vitals Entered By: Milford Cage NCMA (December 14, 2009 10:10 AM)  Physical Exam  General:  alert and well-developed.   Eyes:  anicteric Lungs:  Clear throughout to auscultation. Heart:  normal rate, regular rhythm, no murmur, no gallop, and no rub.   Abdomen:  soft and nontender without HSM/mass Rectal:  female staff present anoderm ok'decreased sphincter tone no mass, brown stool, small rectocele ANOSCOPY: internal hemrrhoids   Impression & Recommendations:  Problem # 1:  HEMORRHOIDS, WITH BLEEDING (ICD-455.8) Assessment Unchanged Recurrent problem. Seen at anoscopy exam today as needed lidocaine/hydrocortisone cream  Problem # 2:  IRRITABLE BOWEL SYNDROME (ICD-564.1) Assessment: Unchanged try Creon-12 with measl (sampled) to see if it helps bloating and gas and hold Align  Patient Instructions: 1)  Hold Align-the probiotic. 2)  Begin taking Creon with meals.  Call our office to let us know how it is working. 3)  Please schedule a follow-up appointment as needed.  4)  The medication list was reviewed and reconciled.  All changed / newly prescribed medications were explained.  A complete medication list was provided to the patient / caregiver.

## 2010-07-17 NOTE — Letter (Signed)
Summary: [facility]  [facility]   Imported By: Sherian Rein 02/08/2010 08:22:48  _____________________________________________________________________  External Attachment:    Type:   Image     Comment:   External Document

## 2010-07-17 NOTE — Letter (Signed)
Summary: [facility]   Imported By: Sherian Rein 03/27/2010 14:53:30  _____________________________________________________________________  External Attachment:    Type:   Image     Comment:   External Document

## 2010-07-17 NOTE — Assessment & Plan Note (Signed)
Summary: fecal incontinence/ mucous/sheri    History of Present Illness Visit Type: Follow-up Visit Primary GI MD: Stan Head MD [facility] Primary Provider: Illene Regulus, MD Chief Complaint: fecal incontinence with mucous  History of Present Illness:   77 yo ww with fecal incontinence. She is seeing a small amount of mucoid discharge into underwear, appears after passing gas. Sees a stain in underwear. It is happenng mre frequently but has been chronic. Occasional streak of blood. She just started Anusol HC cream 2.5% yesterday as refilled by Dr. Debby Bud.    GI Review of Systems    Reports belching.      Denies abdominal pain, acid reflux, bloating, chest pain, dysphagia with liquids, dysphagia with solids, heartburn, loss of appetite, nausea, vomiting, vomiting blood, weight loss, and  weight gain.      Reports fecal incontinence.     Denies anal fissure, black tarry stools, change in bowel habit, constipation, diarrhea, diverticulosis, heme positive stool, hemorrhoids, irritable bowel syndrome, jaundice, light color stool, liver problems, rectal bleeding, and  rectal pain.    Current Medications (verified): 1)  Prilosec 20 Mg  Cpdr (Omeprazole) .... Take 1 Tablet By Mouth Once A Day 2)  Synthroid 75 Mcg  Tabs (Levothyroxine Sodium) .... Take 1 Tablet By Mouth Once A Day 3)  Aspirin 81 Mg  Tabs (Aspirin) .... Take One Tablet Daily 4)  Calcium 1200 Mg  Tabs (Calcium Carbonate) .... Once Daily 5)  Gabapentin 600 Mg  Tabs (Gabapentin) .... Take One Tablet At Bedtime 6)  Multivitamins   Tabs (Multiple Vitamin) .... Take One Tablet Daily 7)  Citrucel   Pack (Methylcellulose (Laxative)) .... Take Daily 8)  D 400   Caps (Vitamins A & D) .... Take One Tablet Daily 9)  Miralax   Powd (Polyethylene Glycol 3350) .... Take Daily 10)  Ambien 5 Mg  Tabs (Zolpidem Tartrate) .... Take As Needed For Sleep 11)  Flonase 50 Mcg/act  Susp (Fluticasone Propionate) .... Take As Needed 12)  Tums 500 Mg   Chew (Calcium Carbonate Antacid) .... Take As Needed 13)  Alprazolam 0.25 Mg  Tabs (Alprazolam) .... Take As Needed 14)  Align   Caps (Misc Intestinal Flora Regulat) .... Once Daily 15)  Actonel 150 Mg  Tabs (Risedronate Sodium) .... Once Monthly 16)  Lidocaine-Hydrocortisone Ace 3-0.5 % Rectal Crea (Lidocaine-Hydrocortisone Ace) .... As Needed For Hemorhoids 17)  Nystatin-Triamcinolone 100000-0.1 Unit/gm-%  Crea (Nystatin-Triamcinolone) .... As Directed As Needed 18)  Derma-Smoothe/fs Scalp 0.01 % Oil (Fluocinolone Acetonide) .... Apply To Scalp Let Dwell 4 Hrs or Overnight 19)  Zantac 25 Mg/ml Soln (Ranitidine Hcl) .... As Needed 20)  Beano  Tabs (Alpha-D-Galactosidase) .... Take 2 Tablets Before Fiber Intake Every Morning. 21)  Anusol-Hc 2.5 % Crea (Hydrocortisone) .... Apply Three Times A Day For Hemorrhoids  Allergies (verified): 1)  ! Demerol  Past History:  Past Medical History: Reviewed history from 02/06/2010 and no changes required. CAROTID BRUIT (ICD-785.9)..but no abnormality by Doppler in the past. NEUROPATHY, IDIOPATHIC PERIPHERAL (ICD-356.9) HYPERTENSION (ICD-401.9) GASTROESOPHAGEAL REFLUX DISEASE (ICD-530.81) DIVERTICULOSIS, COLON, HX OF (ICD-V12.79) HYPERLIPIDEMIA (ICD-272.4) SPINAL STENOSIS (ICD-724.00) HYPOTHYROIDISM (ICD-244.9) ADENOCARCINOMA, COLON, HX OF (ICD-V10.05) IRRITABLE BOWEL SYNDROME (564.1) FECAL INCONTINENCE (LAX SPHINCTER POST-OP) MELANOMA   Past Surgical History: Reviewed history from 02/25/2007 and no changes required. COLECTOMY, HX OF (ICD-V15.2) POLYPECTOMY, HX OF (ICD-V15.9)  Family History: Reviewed history from 12/14/2009 and no changes required. non-contributory Family History of Stomach Cancer:grandfather Maternal No FH of Colon Cancer:  Social History: Reviewed history from  10/25/2008 and no changes required. widowed after a very long and happy marriage.  Lives alone at [place]: paricipates in yoga and exercise.  I-ADLs  including driving. Supportive family who checks on her and visits her regularly. Patient has never smoked.  Alcohol Use - yes  1-2 per week Marina Gravel, MD is nephew  Vital Signs:  Patient profile:   77 year old female Height:      60 inches Weight:      107 pounds BMI:     20.97 Pulse rate:   96 / minute Pulse rhythm:   regular BP sitting:   110 / 62  (left arm)  Vitals Entered By: Milford Cage NCMA (March 23, 2010 2:03 PM)  Physical Exam  General:  well preserved elderly white female in no distress Eyes:  anicteric Rectal:  female staff present perianal area witout rash'lax anal sphincter obvious, no resting prolapse lax sphincter on DRE, formed stool in vault, no mass poor voluntary squeeze  Psych:  Alert and cooperative. Normal mood and affect.   Impression & Recommendations:  Problem # 1:  FULL INCONTINENCE OF FECES (ICD-787.60) Assessment New some mild leakage of mucous in setting of lax anal sphincter ? if she has inflamed hemorrhoids or minor proctitis, she seems to have normal defecation with her fiber so do not think it is an overflow issue not much we can do to alleviate I suspect perhaps HC cream will help  Problem # 2:  IRRITABLE BOWEL SYNDROME (ICD-564.1) Assessment: Unchanged continue fiber regimen  Patient Instructions: 1)  Keep using the Anusol HC for at least a week, twice a day. Hoping this will reduce the discharge from the anus and rectum. 2)  Call back if further questions. 3)  Please continue current medications.  4)  The medication list was reviewed and reconciled.  All changed / newly prescribed medications were explained.  A complete medication list was provided to the patient / caregiver.

## 2010-07-19 NOTE — Progress Notes (Signed)
Summary: Rectal Bleeding   Phone Note Call from Patient Call back at Home Phone [phone]   Call For: Dr Leone Payor Reason for Call: Talk to Nurse Summary of Call: Has been having alot of Rectal Bleeding as well as vaginal bleeding x47m.   Initial call taken by: Leanor Kail [facility],  June 20, 2010 11:01 AM  Follow-up for Phone Call        attempted to return call, no answer and no machine.  I will continue to try and reach the patient  Follow-up by: Darcey Nora RN, CGRN,  June 20, 2010 11:20 AM  Additional Follow-up for Phone Call Additional follow up Details #1::        Patient has had some problems with "hormones" she has had some vaginal rings placed by Dr Eda Paschal and has had some vaginal bleeding also.  Dr Eda Paschal has recommended she be seen here due to an increase in rectal bleeding patient is scheduled to see Mike Gip PA for 06/21/10 10:30.  Patient is very hard of hearing she doesn't seem to hear what you say back to here well.  She does have a "handicapped phone"  that can help with translation.   Additional Follow-up by: Darcey Nora RN, CGRN,  June 20, 2010 2:36 PM

## 2010-07-19 NOTE — Assessment & Plan Note (Signed)
Summary: Rectal bleeding (Dr. Leone Payor patient)    History of Present Illness Visit Type: Follow-up Visit Primary GI MD: Stan Head MD [facility] Primary Provider: Illene Regulus, MD Chief Complaint: rectal bleeding History of Present Illness:   DELIGHTFUL 77 YO FEMALE KNOWN TO DR. Leone Payor. SHE HAS HX OF A RIGHT SIDED COLON CANCER/STAGE 3 IN 2002. SHE HAD COLONOSCOPY LAST IN 2006 PER DR. Doreatha Martin Gilberton  WHICH WAS NEGATIVE EXCEPT FOR DIVERTICULOSIS. SHE HAD A VIRTUAL COLONOSCOPY IN 2009;NEGATIVE THOUGH SIGMOID NOT WELL DISTENDED.  SHE HAS HAD PROBLEMA WITH INTERMITTENT RECTAL BLEEDING. SHE HAD AN ANOSCOPY PER DR. Leone Payor IN 6/11 FOR BLEEDING AND WAS NOTED TO HAVE AN INTERNAL HEMORRHOID. COMES IN TODAY  STATING THAT SHE HAS BEEN HAVING VAGINAL AND RECTAL BLEEDING OVER THE PAST 20 DAYS. SHE IS SEEING DR. Lesle Chris AND IS BEING TREATED WITH HORMONES  FOR DYFUNCTIONAL BLEEDING, AND WAS TOLD THE TREATMENT WOULD GIVE HER  A MENSTRUAL PERIOD. SHE IS  BLEEDING LESS VAGINALLY OVER THE PAST COUPLE DAYS. HER BM'S HAVE  BEEN NORMAL, NO ABDOMINAL PAIN, NO RECTAL PAIN. SHE SEES BLOOD ON THE TISSUE WITHY BM'S. HAS STARTED HERSELF BACK ON PROCTOFAOM THE PAST COUPLE DAYS.   GI Review of Systems      Denies abdominal pain, acid reflux, belching, bloating, chest pain, dysphagia with liquids, dysphagia with solids, heartburn, loss of appetite, nausea, vomiting, vomiting blood, weight loss, and  weight gain.      Reports rectal bleeding.     Denies anal fissure, black tarry stools, change in bowel habit, constipation, diarrhea, diverticulosis, fecal incontinence, heme positive stool, hemorrhoids, irritable bowel syndrome, jaundice, light color stool, liver problems, and  rectal pain.    Current Medications (verified): 1)  Prilosec 20 Mg  Cpdr (Omeprazole) .... Take 1 Tablet By Mouth Once A Day 2)  Synthroid 75 Mcg  Tabs (Levothyroxine Sodium) .... Take 1 Tablet By Mouth Once A Day 3)  Aspirin 81 Mg  Tabs (Aspirin)  .... Take One Tablet Daily 4)  Calcium 1200 Mg  Tabs (Calcium Carbonate) .... Once Daily 5)  Gabapentin 600 Mg  Tabs (Gabapentin) .... Take One Tablet At Bedtime 6)  Multivitamins   Tabs (Multiple Vitamin) .... Take One Tablet Daily 7)  Citrucel   Pack (Methylcellulose (Laxative)) .... Take Daily 8)  D 400   Caps (Vitamins A & D) .... Take One Tablet Daily 9)  Miralax   Powd (Polyethylene Glycol 3350) .... Take Daily 10)  Ambien 5 Mg  Tabs (Zolpidem Tartrate) .... Take As Needed For Sleep 11)  Flonase 50 Mcg/act  Susp (Fluticasone Propionate) .... Take As Needed 12)  Tums 500 Mg  Chew (Calcium Carbonate Antacid) .... Take As Needed 13)  Alprazolam 0.25 Mg  Tabs (Alprazolam) .... Take As Needed 14)  Align   Caps (Misc Intestinal Flora Regulat) .... Once Daily 15)  Actonel 150 Mg  Tabs (Risedronate Sodium) .... Once Monthly 16)  Nystatin-Triamcinolone 100000-0.1 Unit/gm-%  Crea (Nystatin-Triamcinolone) .... As Directed As Needed 17)  Derma-Smoothe/fs Scalp 0.01 % Oil (Fluocinolone Acetonide) .... Apply To Scalp Let Dwell 4 Hrs or Overnight 18)  Zantac 25 Mg/ml Soln (Ranitidine Hcl) .... As Needed 19)  Beano  Tabs (Alpha-D-Galactosidase) .... Take 2 Tablets Before Fiber Intake Every Morning. 20)  Megestrol Acetate 40 Mg Tabs (Megestrol Acetate) .Marland Kitchen.. 1 By Mouth Two Times A Day  Last Dose Is Today 21)  Proctozone-Hc 2.5 % Crea (Hydrocortisone) .... As Needed 22)  Hydrocortisone Acetate 25 Mg Supp (Hydrocortisone Acetate) .... Use  1 Suppository At Bedtime X 10 Days Then As Needed  Allergies (verified): 1)  ! Demerol  Past History:  Past Medical History: CAROTID BRUIT (ICD-785.9)..but no abnormality by Doppler in the past. NEUROPATHY, IDIOPATHIC PERIPHERAL (ICD-356.9) HYPERTENSION (ICD-401.9) GASTROESOPHAGEAL REFLUX DISEASE (ICD-530.81) DIVERTICULOSIS, COLON, HX OF (ICD-V12.79) HYPERLIPIDEMIA (ICD-272.4) SPINAL STENOSIS (ICD-724.00) HYPOTHYROIDISM (ICD-244.9) ADENOCARCINOMA, COLON, HX OF  (ICD-V10.05) 2002 IRRITABLE BOWEL SYNDROME (564.1) FECAL INCONTINENCE (LAX SPHINCTER POST-OP) MELANOMA  DYSFUNCTIONAL UTERINE BLEEDING HEMORRHOIDS  Past Surgical History: RIGHT HEMICOLECTOMY 2002  Family History: Reviewed history from 12/14/2009 and no changes required. non-contributory Family History of Stomach Cancer:grandfather Maternal No FH of Colon Cancer:  Social History: Reviewed history from 10/25/2008 and no changes required. widowed after a very long and happy marriage.  Lives alone at [place]: paricipates in yoga and exercise.  I-ADLs including driving. Supportive family who checks on her and visits her regularly. Patient has never smoked.  Alcohol Use - yes  1-2 per week Marina Gravel, MD is nephew  Review of Systems       SEE HPI  Vital Signs:  Patient profile:   77 year old female Height:      60 inches Weight:      107 pounds BMI:     20.97 Pulse rate:   62 / minute Pulse rhythm:   regular BP sitting:   138 / 60  (left arm)  Vitals Entered By: Chales Abrahams CMA Duncan Dull) (June 22, 2010 10:10 AM)  Physical Exam  General:  Well developed, well nourished, no acute distress.short statured. ,ELDERLY  Head:  Normocephalic and atraumatic. Eyes:  PERRLA, no icterus. Chest Wall:  kyphosis.   Lungs:  Clear throughout to auscultation. Heart:  Regular rate and rhythm; no murmurs, rubs,  or bruits. Abdomen:  SOFT, NONTENDER, NO MASS OR HSM,BS+ Rectal:  LAX ANAL SPHINCTER WITH SMALL PROLAPSING INTERNAL HEMORRHOID WHICH IS INFLAMED, NOT THROMBOSED, FRESH BLOOD ON GLOVE, NO MASS FELT Neurologic:  Alert and  oriented x4;  grossly normal neurologically. Psych:  Alert and cooperative. Normal mood and affect.   Impression & Recommendations:  Problem # 1:  HEMORRHOIDS, WITH BLEEDING (ICD-455.8) Assessment Deteriorated 77YO FEMALE WITH RECURRENT HEMATOCHEZIA CONSISTENT WITH INTERNAL HEMORRHOIDAL BLEEDING. SHE ALSO HAS A RECTOCELE.  CBC TODAY  DUE TO RECENT  VAGINAL BLEEDING AS WELL ANUSOL HC SUPP at bedtime X 10 DAYS ,THEN AS NEEDED. CONTINUE PROCTOFOAM 2-3 X DAILY AS NEEDED. SHE WILL FOLLOW UP WITH  [facility]  IF [facility] PERSIST.  Problem # 2:  ADENOCARCINOMA, COLON, HX OF (ICD-V10.05) Assessment: Unchanged NEGATIVE VIRTUAL COLON 2009, LAST COLON 2006.   SHE HAS FOLLOW UP WITH DR. GRANFORTUNA NEXT WEEK  Problem # 3:  GASTROESOPHAGEAL REFLUX DISEASE (ICD-530.81) Assessment: Comment Only STABLE ON ZANTAC  AS NEEDED  Other Orders: TLB-CBC Platelet - w/Differential ([id number])  Patient Instructions: 1)  Please go to lab, basement level. 2)  We sent a perscription for Hydrocortisone suppositories to [facility].  3)  Continue the Procrtozone Cream 2.5% 3 times daily as needed. 4)  If your bleeding continues beyond the next 2 weeks you will need to call us and be evaluated. 5)  Copy sent to : Dr Cyndie Chime 6)                         Dr. Illene Regulus 7)  You have internal hemorrhoids and internal inflammation. 8)  The medication list was reviewed and reconciled.  All changed / newly prescribed medications  were explained.  A complete medication list was provided to the patient / caregiver. Prescriptions: HYDROCORTISONE ACETATE 25 MG SUPP (HYDROCORTISONE ACETATE) Use 1 suppository at bedtime x 10 days then as needed  #10 x 1   Entered by:   Lowry Ram NCMA   Authorized by:   Sammuel Cooper PA-c   Signed by:   Lowry Ram NCMA on 06/22/2010   Method used:   Electronically to        [facility] [phone]* (retail)       [address]       [place], [place]  [address]       Ph: [phone] or [phone]       Fax: [phone]   RxID:   [id number]

## 2010-07-19 NOTE — Progress Notes (Signed)
Summary: Uses [facility]   Phone Note Call from Patient Call back at [facility] [phone]   Call For: Mike Gip, [place] Summary of Call: Uses [facility]. Would like to start the suppositories recommended today. Initial call taken by: Leanor Kail [facility],  June 22, 2010 3:40 PM  Follow-up for Phone Call        I let the pt know I thought she wanted to use [facility] at [facility] but I just sent the perscription to [facility]. The pt said she will go to [facility] the 7th. Follow-up by: Joselyn Glassman,  June 22, 2010 4:18 PM

## 2010-07-19 NOTE — Progress Notes (Signed)
Summary: labs   Phone Note Call from Patient Call back at Home Phone [phone]   Caller: Patient Call For: Dr. Leone Payor Reason for Call: Talk to Nurse Details for Reason: Labs Summary of Call: Patient came by after having her labs done today.  She is requesting a copy of those labs be sent to her when we receive them. Initial call taken by: Schuyler Amor,  June 22, 2010 2:08 PM Caller: Patient Details for Reason: Labs  Follow-up for Phone Call        I mailed the pt a copy of her labs per her request.

## 2010-07-19 NOTE — Assessment & Plan Note (Signed)
Summary: discuss recent labs/per pt told by MD to fu   Vital Signs:  Patient profile:   77 year old female Weight:      106 pounds BMI:     20.78 Temp:     98.9 degrees F oral Pulse rate:   81 / minute BP sitting:   130 / 64  (left arm) Cuff size:   regular  Vitals Entered By: Lamar Sprinkles, CMA (June 28, 2010 11:46 AM) CC: Discuss low Hgb, recent vaginal & rectal bleeding treated by GI, Urology & GYN.    Primary Care Provider:  Illene Regulus, MD  CC:  Discuss low Hgb, recent vaginal & rectal bleeding treated by GI, and Urology & GYN. Marland Kitchen  History of Present Illness: Mrs. Crites has had a significaqnt problem with gyn and GU problems since last seen. Because of uretheral prolapse, cystocele and rectocele she has been treated with vaginal hormonal rings. As a result she has had significant vaginal bleeding and discomfort. The last ring was removed by Dr. Eda Paschal with some difficulty. Due to the vaginal/uterine bleeding she has been treated with megisterol. Her concern is that she may have a cycle related to estrogen stimulation followed by a progesterone product. All this bleeding has distrubed her. Forturnately she has not had pain or discomfort.  In addition she has had rectal bleeding and was seen by Sondra Come for internal hemorrhoids. Anusol suppositories along with proctofoam was prescribed. She inquires as to the safety of using both in the same day.   She brings with her a CBC revealing a Hgb 11.6 with normal cell indices. She is informed that although this is lower than normal it is not a critical value; she does not neet iron supplementation beyond an iron rich diet and she does not need supplemental B12  An additional problem has been left hip and leg pain that has limited her activity. It is painful to make her usual walk at [place] [facility] or participate in her exercise programs. She has seen Dr. Sandria Manly for this. X-rays were done that reveal moderate to severe DJD of the  hip.  All these problems have her feelling like she is old, something she really hasn't felt before. She does seem despondent about this feeling.  Current Medications (verified): 1)  Prilosec 20 Mg  Cpdr (Omeprazole) .... Take 1 Tablet By Mouth Once A Day 2)  Synthroid 75 Mcg  Tabs (Levothyroxine Sodium) .... Take 1 Tablet By Mouth Once A Day 3)  Aspirin 81 Mg  Tabs (Aspirin) .... Take One Tablet Daily - Hold Per Pt 4)  Calcium 1200 Mg  Tabs (Calcium Carbonate) .... Once Daily 5)  Gabapentin 600 Mg  Tabs (Gabapentin) .... Take One Tablet At Bedtime 6)  Multivitamins   Tabs (Multiple Vitamin) .... Take One Tablet Daily 7)  Citrucel   Pack (Methylcellulose (Laxative)) .... Take Daily 8)  D 400   Caps (Vitamins A & D) .... Take One Tablet Daily 9)  Miralax   Powd (Polyethylene Glycol 3350) .... Take Daily 10)  Ambien 5 Mg  Tabs (Zolpidem Tartrate) .... Take As Needed For Sleep 11)  Flonase 50 Mcg/act  Susp (Fluticasone Propionate) .... Take As Needed 12)  Tums 500 Mg  Chew (Calcium Carbonate Antacid) .... Take As Needed 13)  Alprazolam 0.25 Mg  Tabs (Alprazolam) .... Take As Needed 14)  Align   Caps (Misc Intestinal Flora Regulat) .... Once Daily 15)  Actonel 150 Mg  Tabs (Risedronate Sodium) .... Once  Monthly 16)  Nystatin-Triamcinolone 100000-0.1 Unit/gm-%  Crea (Nystatin-Triamcinolone) .... As Directed As Needed 17)  Derma-Smoothe/fs Scalp 0.01 % Oil (Fluocinolone Acetonide) .... Apply To Scalp Let Dwell 4 Hrs or Overnight 18)  Zantac 25 Mg/ml Soln (Ranitidine Hcl) .... As Needed 19)  Beano  Tabs (Alpha-D-Galactosidase) .... Take 2 Tablets Before Fiber Intake Every Morning. 20)  Megestrol Acetate 40 Mg Tabs (Megestrol Acetate) .Marland Kitchen.. 1 By Mouth Two Times A Day  Last Dose Is Today 21)  Proctozone-Hc 2.5 % Crea (Hydrocortisone) .... As Needed 22)  Hydrocortisone Acetate 25 Mg Supp (Hydrocortisone Acetate) .... Use 1 Suppository At Bedtime X 10 Days Then As Needed  Allergies (verified): 1)  !  Demerol  Past History:  Past Medical History: Last updated: 06/22/2010 CAROTID BRUIT (ICD-785.9)..but no abnormality by Doppler in the past. NEUROPATHY, IDIOPATHIC PERIPHERAL (ICD-356.9) HYPERTENSION (ICD-401.9) GASTROESOPHAGEAL REFLUX DISEASE (ICD-530.81) DIVERTICULOSIS, COLON, HX OF (ICD-V12.79) HYPERLIPIDEMIA (ICD-272.4) SPINAL STENOSIS (ICD-724.00) HYPOTHYROIDISM (ICD-244.9) ADENOCARCINOMA, COLON, HX OF (ICD-V10.05) 2002 IRRITABLE BOWEL SYNDROME (564.1) FECAL INCONTINENCE (LAX SPHINCTER POST-OP) MELANOMA  DYSFUNCTIONAL UTERINE BLEEDING HEMORRHOIDS  Past Surgical History: Last updated: 06/22/2010 RIGHT HEMICOLECTOMY 2002  Social History: Last updated: 10/25/2008 widowed after a very long and happy marriage.  Lives alone at [place]: paricipates in yoga and exercise.  I-ADLs including driving. Supportive family who checks on her and visits her regularly. Patient has never smoked.  Alcohol Use - yes  1-2 per week Marina Gravel, MD is nephew FH reviewed for relevance  Review of Systems       The patient complains of decreased hearing, hematochezia, difficulty walking, and abnormal bleeding.  The patient denies anorexia, fever, weight loss, weight gain, vision loss, chest pain, dyspnea on exertion, peripheral edema, prolonged cough, abdominal pain, severe indigestion/heartburn, incontinence, depression, enlarged lymph nodes, and angioedema.    Physical Exam  General:  Small framed and thin white woman in no distress Head:  normocephalic and atraumatic.   Eyes:  vision grossly intact, pupils equal, and pupils round.  C&S clear Mouth:  very dry mucus membranes. Neck:  supple.   Lungs:  normal respiratory effort.   Heart:  normal rate and regular rhythm.   Abdomen:  soft and non-tender.   Msk:  normal ROM, no joint swelling, and no redness over joints.   Pulses:  2+ radial pulse Neurologic:  alert & oriented X3, cranial nerves II-XII intact, and gait normal.   Skin:   turgor normal and color normal.   Psych:  Oriented X3, memory intact for recent and remote, normally interactive, good eye contact, and moderately anxious.     Impression & Recommendations:  Problem # 1:  HEMORRHOIDS, WITH BLEEDING (ICD-455.8) Recurrent internal hemorrhoids. She is advised that it is fine to use both the anusol suppository and alternate with proctofoam. Stressed the importance of a easy, soft bowel movement. She is aware of this but has a problem knowing what to do secondary to taking adequate fiber for her IBS and iron rich foods that constipate.  Plan - no change in her basic, healthy diet.           take low dose of Milk of Mag daily to help promote an easy bowel habit.  Problem # 2:  ANEMIA, NORMOCYTIC, CHRONIC (ICD-285.9) Reviewed lab that she has brought with her - Hgb 11.6, normal MCV. Reassured her that although below normal this is not a dangerous value and that she has a chronic anemia.  Plan - continue with diet management.  Problem # 3:  NEUROPATHY, IDIOPATHIC PERIPHERAL (ICD-356.9) This is a chronic problem for which she sees Dr. Sandria Manly. No new recommendations.  Problem # 4:  VAGINAL BLEEDING, ABNORMAL (ICD-626.9) a compicated problem for her. She is seeing Dr. Eda Paschal for [facility] of this problem , along with Dr. Patsi Sears for the issue of a cystocele.. No additinoal recommendations.  Problem # 5:  OSTEOARTHRITIS, HIP, LEFT (ICD-715.95) a new pain for her that is limiting her activites.  Plan - remain active and consider physical therapy for both treatment of the degenerative hip but also for the secondary involvement of soft tissue, i.e. muscle strain/pain due to altered gait.          Acetominophen 500 to 1000mg  three times a day on a scheduled basis to control the pain.          judicious use of NSAIDs given her history of gastritis.  Her updated medication list for this problem includes:    Aspirin 81 Mg Tabs (Aspirin) .Marland Kitchen... Take one tablet daily - hold  per pt  Problem # 6:  Preventive Health Care (ICD-V70.0) She is current with preventive care and for the monitoring of several of her medical problems. overall she is doing well for a nano-generian. She is a bit despondent that she is having so many medical issues but overall does pretty well, remains independent and active. She has a supportive family and in fact her grandaughter is coming to see her this weekend.  She will return as needed.   Complete Medication List: 1)  Prilosec 20 Mg Cpdr (Omeprazole) .... Take 1 tablet by mouth once a day 2)  Synthroid 75 Mcg Tabs (Levothyroxine sodium) .... Take 1 tablet by mouth once a day 3)  Aspirin 81 Mg Tabs (Aspirin) .... Take one tablet daily - hold per pt 4)  Calcium 1200 Mg Tabs (calcium Carbonate)  .... Once daily 5)  Gabapentin 600 Mg Tabs (Gabapentin) .... Take one tablet at bedtime 6)  Multivitamins Tabs (Multiple vitamin) .... Take one tablet daily 7)  Citrucel Pack (Methylcellulose (laxative)) .... Take daily 8)  D 400 Caps (Vitamins a & d) .... Take one tablet daily 9)  Miralax Powd (Polyethylene glycol 3350) .... Take daily 10)  Ambien 5 Mg Tabs (Zolpidem tartrate) .... Take as needed for sleep 11)  Flonase 50 Mcg/act Susp (Fluticasone propionate) .... Take as needed 12)  Tums 500 Mg Chew (Calcium carbonate antacid) .... Take as needed 13)  Alprazolam 0.25 Mg Tabs (Alprazolam) .... Take as needed 14)  Align Caps (Misc intestinal flora regulat) .... Once daily 15)  Actonel 150 Mg Tabs (Risedronate sodium) .... Once monthly 16)  Nystatin-triamcinolone 100000-0.1 Unit/gm-% Crea (Nystatin-triamcinolone) .... As directed as needed 17)  Derma-smoothe/fs Scalp 0.01 % Oil (Fluocinolone acetonide) .... Apply to scalp let dwell 4 hrs or overnight 18)  Zantac 25 Mg/ml Soln (Ranitidine hcl) .... As needed 19)  Beano Tabs (Alpha-d-galactosidase) .... Take 2 tablets before fiber intake every morning. 20)  Megestrol Acetate 40 Mg Tabs (Megestrol  acetate) .Marland Kitchen.. 1 by mouth two times a day  last dose is today 21)  Proctozone-hc 2.5 % Crea (Hydrocortisone) .... As needed 22)  Hydrocortisone Acetate 25 Mg Supp (Hydrocortisone acetate) .... Use 1 suppository at bedtime x 10 days then as needed  Patient Instructions: 1)  Anemia - the Hemoglobin at 11.6 is not that low. There is no evidence on the CBC of iron deficiency or B12 deficieny. An iron rich diet will help. 2)  Hip pain - mmoderate  to severe arthritis by x-ray. there can also be pain from the supporting muscles of the buttock and quadraceps. Good massage therapy for piriformis, gluteal and qwuadraceps muscles can help a great deal along with stretching exercises. Tylenol (generic) taken on schedule can help with the pain with no risk of gastric irritation - up to 3000mg  daily in divideded doses. Anti-inflammatory drugs, e.g. aleve, advil, can also help but taying with the minimal dose possible to avoid stomach irritation. 3)  Gynecologic and urologic bleeding - I defer to your other doctors. But, you have not suffered medically significant amount of blood loss.    Orders Added: 1)  Est. Patient Level IV [57846]

## 2010-07-25 NOTE — Assessment & Plan Note (Signed)
Summary: OV---STC   Vital Signs:  Patient profile:   77 year old female Height:      60 inches Weight:      105 pounds BMI:     20.58 O2 Sat:      97 % on Room air Temp:     97.8 degrees F oral Pulse rate:   94 / minute BP sitting:   128 / 78  (left arm) Cuff size:   regular  Vitals Entered By: Bill Salinas CMA (July 17, 2010 1:53 PM)  O2 Flow:  Room air CC: pt here to discuss her medications/ ab   Primary Care Provider:  Illene Regulus, MD  CC:  pt here to discuss her medications/ ab.  History of Present Illness: Mrs. Murton returns today to discuss her on going problem with uretheral prolapse. She says there is a "golf ball sized protruberance" at the vaginal orifaTannenbaum's note, paying especial attention to his detailed genitalia exam in the Sept 30 note: no cystocele, rectocele, uterine prolapse or other abnormality except for atrophic vaginitis and uretheral prolapse.   Current Medications (verified): 1)  Prilosec 20 Mg  Cpdr (Omeprazole) .... Take 1 Tablet By Mouth Once A Day 2)  Synthroid 75 Mcg  Tabs (Levothyroxine Sodium) .... Take 1 Tablet By Mouth Once A Day 3)  Aspirin 81 Mg  Tabs (Aspirin) .... Take One Tablet Daily - Hold Per Pt 4)  Calcium 1200 Mg  Tabs (Calcium Carbonate) .... Once Daily 5)  Gabapentin 600 Mg  Tabs (Gabapentin) .... Take One Tablet At Bedtime 6)  Multivitamins   Tabs (Multiple Vitamin) .... Take One Tablet Daily 7)  Citrucel   Pack (Methylcellulose (Laxative)) .... Take Daily 8)  D 400   Caps (Vitamins A & D) .... Take One Tablet Daily 9)  Miralax   Powd (Polyethylene Glycol 3350) .... Take Daily 10)  Ambien 5 Mg  Tabs (Zolpidem Tartrate) .... Take As Needed For Sleep 11)  Flonase 50 Mcg/act  Susp (Fluticasone Propionate) .... Take As Needed 12)  Tums 500 Mg  Chew (Calcium Carbonate Antacid) .... Take As Needed 13)  Alprazolam 0.25 Mg  Tabs (Alprazolam) .... Take As Needed 14)  Align   Caps (Misc Intestinal Flora Regulat) .... Once  Daily 15)  Actonel 150 Mg  Tabs (Risedronate Sodium) .... Once Monthly 16)  Nystatin-Triamcinolone 100000-0.1 Unit/gm-%  Crea (Nystatin-Triamcinolone) .... As Directed As Needed 17)  Derma-Smoothe/fs Scalp 0.01 % Oil (Fluocinolone Acetonide) .... Apply To Scalp Let Dwell 4 Hrs or Overnight 18)  Zantac 25 Mg/ml Soln (Ranitidine Hcl) .... As Needed 19)  Beano  Tabs (Alpha-D-Galactosidase) .... Take 2 Tablets Before Fiber Intake Every Morning. 20)  Megestrol Acetate 40 Mg Tabs (Megestrol Acetate) .Marland Kitchen.. 1 By Mouth Two Times A Day  Last Dose Is Today 21)  Proctozone-Hc 2.5 % Crea (Hydrocortisone) .... As Needed 22)  Hydrocortisone Acetate 25 Mg Supp (Hydrocortisone Acetate) .... Use 1 Suppository At Bedtime X 10 Days Then As Needed  Allergies (verified): 1)  ! Demerol  Past History:  Past Medical History: Last updated: 06/22/2010 CAROTID BRUIT (ICD-785.9)..but no abnormality by Doppler in the past. NEUROPATHY, IDIOPATHIC PERIPHERAL (ICD-356.9) HYPERTENSION (ICD-401.9) GASTROESOPHAGEAL REFLUX DISEASE (ICD-530.81) DIVERTICULOSIS, COLON, HX OF (ICD-V12.79) HYPERLIPIDEMIA (ICD-272.4) SPINAL STENOSIS (ICD-724.00) HYPOTHYROIDISM (ICD-244.9) ADENOCARCINOMA, COLON, HX OF (ICD-V10.05) 2002 IRRITABLE BOWEL SYNDROME (564.1) FECAL INCONTINENCE (LAX SPHINCTER POST-OP) MELANOMA  DYSFUNCTIONAL UTERINE BLEEDING HEMORRHOIDS  Past Surgical History: Last updated: 06/22/2010 RIGHT HEMICOLECTOMY 2002 FH reviewed for relevance, SH/Risk Factors reviewed for relevance  Review of Systems       The patient complains of hoarseness.  The patient denies anorexia, fever, weight loss, decreased hearing, chest pain, dyspnea on exertion, abdominal pain, severe indigestion/heartburn, genital sores, difficulty walking, depression, and abnormal bleeding.    Physical Exam  General:  sspry elderly white woman in no distress Head:  Normocephalic and atraumatic without obvious abnormalities. No apparent alopecia or  balding. Eyes:  C&S clear Lungs:  normal respiratory effort and normal breath sounds.   Heart:  normal rate and regular rhythm.   Neurologic:  alert & oriented X3, cranial nerves II-XII intact, and gait normal.   Psych:  Oriented X3, memory intact for recent and remote, normally interactive, and good eye contact.     Impression & Recommendations:  Problem # 1:  VAGINITIS, ATROPHIC (ICD-627.3) Reviewed record with her. Explained that atrophic vaginitis is a seperate problem and a source of her discomfort: dryness, vaginal irritation.  Plan - encouraged her to have/continue to have topical estrogen replacement - ring device is a good method. She will see Dr. Essie Hart for this.   Problem # 2:  PROLAPSED URETHRAL MUCOSA (ICD-599.5) Reviewed with her google images to explan anatomy and the detailed nature of thie problem. Searched "UpToDate" for non-surgical treatments to no avail. Discussed the problem with her son, by telephone, who had also been reading on this problem. We all agreed that surgery is not an option. The topical hormone replacement may be helpful in that restoration of vaginal mucosal integrity may provide some support for the bladder. No furhter treatment is needed as long as she is able to void and doesn't have pain.  (greater than 50% of a 40 minute visit spent in eduation and counseling and family conference)  Complete Medication List: 1)  Prilosec 20 Mg Cpdr (Omeprazole) .... Take 1 tablet by mouth once a day 2)  Synthroid 75 Mcg Tabs (Levothyroxine sodium) .... Take 1 tablet by mouth once a day 3)  Aspirin 81 Mg Tabs (Aspirin) .... Take one tablet daily - hold per pt 4)  Calcium 1200 Mg Tabs (calcium Carbonate)  .... Once daily 5)  Gabapentin 600 Mg Tabs (Gabapentin) .... Take one tablet at bedtime 6)  Multivitamins Tabs (Multiple vitamin) .... Take one tablet daily 7)  Citrucel Pack (Methylcellulose (laxative)) .... Take daily 8)  D 400 Caps (Vitamins a & d) .... Take  one tablet daily 9)  Miralax Powd (Polyethylene glycol 3350) .... Take daily 10)  Ambien 5 Mg Tabs (Zolpidem tartrate) .... Take as needed for sleep 11)  Flonase 50 Mcg/act Susp (Fluticasone propionate) .... Take as needed 12)  Tums 500 Mg Chew (Calcium carbonate antacid) .... Take as needed 13)  Alprazolam 0.25 Mg Tabs (Alprazolam) .... Take as needed 14)  Align Caps (Misc intestinal flora regulat) .... Once daily 15)  Actonel 150 Mg Tabs (Risedronate sodium) .... Once monthly 16)  Nystatin-triamcinolone 100000-0.1 Unit/gm-% Crea (Nystatin-triamcinolone) .... As directed as needed 17)  Derma-smoothe/fs Scalp 0.01 % Oil (Fluocinolone acetonide) .... Apply to scalp let dwell 4 hrs or overnight 18)  Zantac 25 Mg/ml Soln (Ranitidine hcl) .... As needed 19)  Beano Tabs (Alpha-d-galactosidase) .... Take 2 tablets before fiber intake every morning. 20)  Megestrol Acetate 40 Mg Tabs (Megestrol acetate) .Marland Kitchen.. 1 by mouth two times a day  last dose is today 21)  Proctozone-hc 2.5 % Crea (Hydrocortisone) .... As needed 22)  Hydrocortisone Acetate 25 Mg Supp (Hydrocortisone acetate) .... Use 1 suppository at bedtime x 10  days then as needed   Orders Added: 1)  Est. Patient Level IV [96045]

## 2010-08-02 ENCOUNTER — Ambulatory Visit (INDEPENDENT_AMBULATORY_CARE_PROVIDER_SITE_OTHER): Payer: Medicare Other | Admitting: Obstetrics and Gynecology

## 2010-08-02 DIAGNOSIS — N952 Postmenopausal atrophic vaginitis: Secondary | ICD-10-CM

## 2010-08-02 DIAGNOSIS — B373 Candidiasis of vulva and vagina: Secondary | ICD-10-CM

## 2010-08-02 DIAGNOSIS — N8111 Cystocele, midline: Secondary | ICD-10-CM

## 2010-08-02 NOTE — Letter (Addendum)
Summary: [facility]  [facility]   Imported By: Sherian Rein 07/23/2010 11:44:41  _____________________________________________________________________  External Attachment:    Type:   Image     Comment:   External Document

## 2010-08-03 ENCOUNTER — Ambulatory Visit (INDEPENDENT_AMBULATORY_CARE_PROVIDER_SITE_OTHER): Payer: Medicare Other | Admitting: Obstetrics and Gynecology

## 2010-08-03 ENCOUNTER — Telehealth: Payer: Self-pay | Admitting: Internal Medicine

## 2010-08-03 DIAGNOSIS — K625 Hemorrhage of anus and rectum: Secondary | ICD-10-CM

## 2010-08-08 ENCOUNTER — Ambulatory Visit: Payer: Self-pay | Admitting: Obstetrics and Gynecology

## 2010-08-08 ENCOUNTER — Encounter: Payer: Self-pay | Admitting: Physician Assistant

## 2010-08-08 ENCOUNTER — Ambulatory Visit (INDEPENDENT_AMBULATORY_CARE_PROVIDER_SITE_OTHER): Payer: Medicare Other | Admitting: Physician Assistant

## 2010-08-08 DIAGNOSIS — K625 Hemorrhage of anus and rectum: Secondary | ICD-10-CM | POA: Insufficient documentation

## 2010-08-08 DIAGNOSIS — K648 Other hemorrhoids: Secondary | ICD-10-CM

## 2010-08-08 DIAGNOSIS — Z860100 Personal history of colon polyps, unspecified: Secondary | ICD-10-CM | POA: Insufficient documentation

## 2010-08-08 NOTE — Progress Notes (Signed)
Summary: Rectal bleed   Phone Note Call from Patient Call back at Home Phone [phone]   Call For: Dr Leone Payor Summary of Call: Alot of rectal bleeding, wants  appt to be seen next wk. Initial call taken by: Leanor Kail [facility],  August 03, 2010 3:56 PM  Follow-up for Phone Call        patient was seen today by dr Eda Paschal for a large amount of bleeding.  Patient was not sure if it was a vaginal or rectal source.   he feels this was rectal and has recommended she Dr Leone Payor.  Patient is scheduled to see Mike Gip PA on 08/08/10.  She is unable to come before then. Follow-up by: Darcey Nora RN, CGRN,  August 03, 2010 4:35 PM

## 2010-08-14 NOTE — Assessment & Plan Note (Addendum)
Summary: rectal bleeding/sheri    History of Present Illness Visit Type: Follow-up Visit Primary GI MD: Stan Head MD [facility] Primary Provider: Illene Regulus, MD Requesting Provider: na Chief Complaint: Pt c/o rectal bleeding without a BM  History of Present Illness:   PLEASANT 77 YO FEMALE KNOWN TO DR. Leone Payor. SHE HAS HX OF COLON CANCER  WITH RIGHT HEMICOLECTOMY IN 2002. SHE LAST HAD COLONOSCOPY IN 7/06 WITH DIVERTICULOSIS,AND ONE SMALL POLYP REMOVED (NOT RETRIEVED). SHE HAD A VIRTUAL COLONOSCOPY IN 2008. SHE HAS BEEN SEEN TWICE IN THE PAST 8 MONTHS WITH C/O RECTAL BLEEDING. AT BOTH VISITS SHE HAS AN INTERNAL HEMORRHOID THAT PROTRUDES THRU A LAX SPHINCTER,IS INFLAMED AND FELT TO BE THE SOURCE OF HER BLEEDING.   SHE COMES BACK TODAY ON THE ADVICE OF DR GOTTSEGGEN BECAUSE OF ANOTHER EPISODE OF BRB THAT SHE FEELS IS NOT HEMORRHOIDAL BECAUSE IT OCCURED WITH URINATION NOT A BM. SHE HAS BEEN HAVING A LOT OF TROUBLE WITH DYSFUNCTIONAL UTERINE BLEEDING, AND HAS GONE THRU SOME HORMONE TREATMENT, AND VAGINAL RINGS. SHE JUST HAD AN EST RING PLACED LAST WEEK. THIS SEEMS TO BE COMFORTABLE, IS HELPING WITH BLADDER FUNCTION, AND SHE HAS NOT HAD ANY VAGINAL BLEEDING SINCE. SHE DID HAVE AN EPISODE OF BRB PER RECTUM ON THE 17 TH . SHE IS USING PROCTOSOL FOAM TWICE DAILY AND HAS NOT SEEN ANY MORE BLOOD.  SHE IS CONCERNED ABOUT WHERE THE BLEEDING IS COMING FROM.   GI Review of Systems      Denies abdominal pain, acid reflux, belching, bloating, chest pain, dysphagia with liquids, dysphagia with solids, heartburn, loss of appetite, nausea, vomiting, vomiting blood, weight loss, and  weight gain.      Reports rectal bleeding.     Denies anal fissure, black tarry stools, change in bowel habit, constipation, diarrhea, diverticulosis, fecal incontinence, heme positive stool, hemorrhoids, irritable bowel syndrome, jaundice, light color stool, liver problems, and  rectal pain.    Current Medications (verified): 1)   Prilosec 20 Mg  Cpdr (Omeprazole) .... Take 1 Tablet By Mouth Once A Day 2)  Synthroid 75 Mcg  Tabs (Levothyroxine Sodium) .... Take 1 Tablet By Mouth Once A Day 3)  Aspirin 81 Mg  Tabs (Aspirin) .... Take One Tablet Daily - Hold Per Pt 4)  Calcium 1200 Mg  Tabs (Calcium Carbonate) .... Once Daily 5)  Gabapentin 600 Mg  Tabs (Gabapentin) .... Take One Tablet At Bedtime 6)  Multivitamins   Tabs (Multiple Vitamin) .... Take One Tablet Daily 7)  Citrucel   Pack (Methylcellulose (Laxative)) .... Take Daily 8)  D 400   Caps (Vitamins A & D) .... Take One Tablet Daily 9)  Miralax   Powd (Polyethylene Glycol 3350) .... Take Daily 10)  Ambien 5 Mg  Tabs (Zolpidem Tartrate) .... Take As Needed For Sleep 11)  Flonase 50 Mcg/act  Susp (Fluticasone Propionate) .... Take As Needed 12)  Tums 500 Mg  Chew (Calcium Carbonate Antacid) .... Take As Needed 13)  Alprazolam 0.25 Mg  Tabs (Alprazolam) .... Take As Needed 14)  Align   Caps (Misc Intestinal Flora Regulat) .... Once Daily 15)  Actonel 150 Mg  Tabs (Risedronate Sodium) .... Once Monthly 16)  Nystatin-Triamcinolone 100000-0.1 Unit/gm-%  Crea (Nystatin-Triamcinolone) .... As Directed As Needed 17)  Zantac 25 Mg/ml Soln (Ranitidine Hcl) .... As Needed 18)  Beano  Tabs (Alpha-D-Galactosidase) .... Take 2 Tablets Before Fiber Intake Every Morning. 19)  Proctozone-Hc 2.5 % Crea (Hydrocortisone) .... As Needed 20)  Hydrocortisone Acetate 25 Mg  Supp (Hydrocortisone Acetate) .... Use 1 Suppository At Bedtime X 10 Days Then As Needed 21)  Estring  Allergies (verified): 1)  ! Demerol  Past History:  Past Medical History: PROLAPSED URETHRAL MUCOSA (ICD-599.5) VAGINAL BLEEDING, ABNORMAL (ICD-626.9) OSTEOARTHRITIS, HIP, LEFT (ICD-715.95) FULL INCONTINENCE OF FECES (ICD-787.60) TRANSAMINASES, SERUM, ELEVATED (ICD-790.4) VARICOSE VEINS, LOWER EXTREMITIES (ICD-454.9) ANEMIA, NORMOCYTIC, CHRONIC (ICD-285.9) IDIOPATHIC OSTEOPOROSIS (ICD-733.02) VAGINITIS,  ATROPHIC (ICD-627.3) MELANOMA OF SKIN, SITE UNSPECIFIED (ICD-172.9) GASTROENTERITIS (ICD-558.9) IRON DEFICIENCY (ICD-280.9) IRRITABLE BOWEL SYNDROME (ICD-564.1) INTERNAL HEMORRHOIDS WITHOUT MENTION COMP (ICD-455.0) Hx of CAROTID BRUIT (ICD-785.9) NEUROPATHY, IDIOPATHIC PERIPHERAL (ICD-356.9) HYPERTENSION (ICD-401.9) GASTROESOPHAGEAL REFLUX DISEASE (ICD-530.81) DIVERTICULOSIS, COLON, HX OF (ICD-V12.79) HYPERLIPIDEMIA (ICD-272.4) SPINAL STENOSIS (ICD-724.00) HYPOTHYROIDISM (ICD-244.9) POLYPECTOMY, HX OF (ICD-V15.9) ADENOCARCINOMA, COLON, HX OF (ICD-V10.05) FECAL INCONTINENCE (LAX SPHINCTER POST-OP) MELANOMA  DYSFUNCTIONAL UTERINE BLEEDING HEMORRHOIDS  Past Surgical History: RIGHT HEMICOLECTOMY 2002 COLONOSCOPY 2005-[facility]  Family History: Reviewed history from 12/14/2009 and no changes required. non-contributory Family History of Stomach Cancer:grandfather Maternal No FH of Colon Cancer:  Social History: Reviewed history from 10/25/2008 and no changes required. widowed after a very long and happy marriage.  Lives alone at [place]: paricipates in yoga and exercise.  I-ADLs including driving. Supportive family who checks on her and visits her regularly. Patient has never smoked.  Alcohol Use - yes  1-2 per week Marina Gravel, MD is nephew  Review of Systems       The patient complains of urination changes/pain.  The patient denies allergy/sinus, anemia, anxiety-new, arthritis/joint pain, back pain, blood in urine, breast changes/lumps, change in vision, confusion, cough, coughing up blood, depression-new, fainting, fatigue, fever, headaches-new, hearing problems, heart murmur, heart rhythm changes, itching, menstrual pain, muscle pains/cramps, night sweats, nosebleeds, pregnancy symptoms, shortness of breath, skin rash, sleeping problems, sore throat, swelling of feet/legs, swollen lymph glands, thirst - excessive , urination - excessive , urine leakage, vision changes, and  voice change.         SEE HPI  Vital Signs:  Patient profile:   77 year old female Height:      60 inches Weight:      103 pounds BMI:     20.19 BSA:     1.41 Pulse rate:   96 / minute Pulse rhythm:   regular BP sitting:   118 / 64  (left arm) Cuff size:   regular  Vitals Entered By: Ok Anis CMA (August 08, 2010 11:20 AM)  Physical Exam  General:  Well developed, well nourished, no acute distress.,SMALL ELDERLY Head:  Normocephalic and atraumatic. Eyes:  PERRLA, no icterus. Chest Wall:  kyphosis.   Lungs:  Clear throughout to auscultation. Heart:  Regular rate and rhythm; no murmurs, rubs,  or bruits. Abdomen:  SOFT, NONTENDER, NO MASS OR HSM,BS+ Rectal:  LAX ANAL SPHINCTER WITH PROTRUDING INTERNAL HEMORRHOID,INLFAMED, NOT SWOLLEN, STOOL HEME NEGATIVE Neurologic:  Alert and  oriented x4;  grossly normal neurologically. Psych:  Alert and cooperative. Normal mood and affect.   Impression & Recommendations:  Problem # 1:  RECTAL BLEEDING (ICD-569.3) Assessment Improved 77 YO FEMALE WITHN RECURRENT SMALL VOLUME RECTAL BLEEDING FELT SECONDARY TO INTERNAL HEMORRHOID. PT DOES HAVE HX OF COLON CANCER -S/P RIGHT HEMICOLECTOMY IN 2002. SHE HAS NOT HAD COLONOSCOPIC EXAM SINCE 2005.   WE DISCUSSED  FOLLOW UP COLONOSCOPY WHICH SHE IS  NOT INTERESTED IN , AND DO NOT THINK  SHE NEEDS TO PURSUE THIS AS SHE HAS AN OBVIOUS SOURCE FOR HER BLEEDING. SHE WILL USE PROCTOSOL  TWICE DAILY FOR 10 DAYS , THEN AS  NEEDED. SHE WILL  CALL FOR FURTHER PROBLEMS  Problem # 2:  VAGINAL BLEEDING, ABNORMAL (ICD-626.9) Assessment: Comment Only  Problem # 3:  DIVERTICULOSIS, COLON, HX OF (ICD-V12.79) Assessment: Comment Only  Problem # 4:  ADENOCARCINOMA, COLON, HX OF (ICD-V10.05) Assessment: Comment Only RIGHT HEMICOLECTOMY 2002  Patient Instructions: 1)  Continue the Proctosol twice daily. 2)  Call us if you have any further bleeding. 3)  Copy sent to : Dr. Debby Bud 4)  The medication list  was reviewed and reconciled.  All changed / newly prescribed medications were explained.  A complete medication list was provided to the patient / caregiver.

## 2010-08-31 ENCOUNTER — Other Ambulatory Visit: Payer: Self-pay | Admitting: Obstetrics and Gynecology

## 2010-08-31 DIAGNOSIS — Z1231 Encounter for screening mammogram for malignant neoplasm of breast: Secondary | ICD-10-CM

## 2010-09-18 ENCOUNTER — Ambulatory Visit (HOSPITAL_COMMUNITY)
Admission: RE | Admit: 2010-09-18 | Discharge: 2010-09-18 | Disposition: A | Discharge status: Home / Self Care | Payer: Medicare Other | Source: Ambulatory Visit | Attending: Obstetrics and Gynecology | Admitting: Obstetrics and Gynecology

## 2010-09-18 DIAGNOSIS — Z1231 Encounter for screening mammogram for malignant neoplasm of breast: Secondary | ICD-10-CM | POA: Insufficient documentation

## 2010-09-27 ENCOUNTER — Telehealth: Payer: Self-pay | Admitting: *Deleted

## 2010-09-27 NOTE — Telephone Encounter (Signed)
Refill request for Alprazolam 0.25mg   SIG: take one tablet by mouth every 6 hours as needed fax from [facility] [address] [phone]

## 2010-09-27 NOTE — Telephone Encounter (Signed)
Ok for refill x 5 

## 2010-09-28 MED ORDER — ALPRAZOLAM 0.25 MG PO TABS: 0.2500 mg | ORAL_TABLET | Freq: Four times a day (QID) | ORAL | Status: DC | PRN

## 2010-09-28 NOTE — Telephone Encounter (Signed)
refill 

## 2010-10-12 ENCOUNTER — Telehealth: Payer: Self-pay | Admitting: Internal Medicine

## 2010-10-12 NOTE — Telephone Encounter (Signed)
Left message for patient to call back  

## 2010-10-15 NOTE — Telephone Encounter (Signed)
Patient needs an appt with Dr Leone Payor prior to seeing Dr Eda Paschal to change out her hormone ring.  She will come in and see Dr Leone Payor on 10/26/10

## 2010-10-16 ENCOUNTER — Other Ambulatory Visit: Payer: Self-pay | Admitting: Internal Medicine

## 2010-10-16 ENCOUNTER — Encounter (INDEPENDENT_AMBULATORY_CARE_PROVIDER_SITE_OTHER): Payer: Medicare Other | Admitting: Obstetrics and Gynecology

## 2010-10-16 DIAGNOSIS — M810 Age-related osteoporosis without current pathological fracture: Secondary | ICD-10-CM

## 2010-10-16 DIAGNOSIS — N952 Postmenopausal atrophic vaginitis: Secondary | ICD-10-CM

## 2010-10-16 DIAGNOSIS — B373 Candidiasis of vulva and vagina: Secondary | ICD-10-CM

## 2010-10-16 DIAGNOSIS — N8111 Cystocele, midline: Secondary | ICD-10-CM

## 2010-10-16 DIAGNOSIS — R82998 Other abnormal findings in urine: Secondary | ICD-10-CM

## 2010-10-26 ENCOUNTER — Ambulatory Visit (INDEPENDENT_AMBULATORY_CARE_PROVIDER_SITE_OTHER): Payer: Medicare Other | Admitting: Internal Medicine

## 2010-10-26 ENCOUNTER — Encounter: Payer: Self-pay | Admitting: Internal Medicine

## 2010-10-26 DIAGNOSIS — K648 Other hemorrhoids: Secondary | ICD-10-CM

## 2010-10-26 DIAGNOSIS — N8111 Cystocele, midline: Secondary | ICD-10-CM

## 2010-10-26 DIAGNOSIS — K589 Irritable bowel syndrome without diarrhea: Secondary | ICD-10-CM

## 2010-10-26 DIAGNOSIS — R159 Full incontinence of feces: Secondary | ICD-10-CM

## 2010-10-26 DIAGNOSIS — N952 Postmenopausal atrophic vaginitis: Secondary | ICD-10-CM

## 2010-10-26 DIAGNOSIS — N8110 Cystocele, unspecified: Secondary | ICD-10-CM

## 2010-10-26 DIAGNOSIS — Z85038 Personal history of other malignant neoplasm of large intestine: Secondary | ICD-10-CM

## 2010-10-26 NOTE — Patient Instructions (Signed)
Dr Leone Payor will let Dr Eda Paschal  Know if its okay to insert the vaginal ring Please follow up as needed

## 2010-10-29 ENCOUNTER — Encounter: Payer: Self-pay | Admitting: Internal Medicine

## 2010-10-29 NOTE — Progress Notes (Signed)
  Subjective:    Patient ID: [name] Small, female    DOB: 1917-06-04, 77 y.o.   MRN: [id number]  HPI 77 year old white woman who presents for discussion of possible insertion of vaginal ring. She has trouble with bladder prolapse. She has used a hormone ring in the past but apparently is out. Her gynecologist would like advice as to whether or not there is increased cancer risk in whether it would be appropriate from a GI perspective. She continues to have intermittent fecal incontinence and she had over the years. No rectal bleeding recently though her hemorrhoids to flare from time to time and a long-standing known problem her quality of life suffers quite a bit with the bladder prolapse issues. She is planning to travel to [place] in June for a grandsons wedding and thinks reinsertion of the ring will help with her bladder prolapse issues. Otherwise she is continuing to be overall fairly well and doing quite well for her age.   Review of Systems     Objective:   Physical Exam Elderly white woman no acute distress Rectal exam performed with female staff present. Inspection of the anoderm show some mild perianal erythema. Digital exam shows a lax anal sphincter but no mass. Anoscopic exam is performed confirming some inactive hemorrhoids and anal canal.       Assessment & Plan:

## 2010-10-29 NOTE — Assessment & Plan Note (Signed)
These are currently relatively inactive.

## 2010-10-29 NOTE — Assessment & Plan Note (Signed)
This is stable in overall reasonable control.

## 2010-10-29 NOTE — Assessment & Plan Note (Signed)
She reports that this is bothering her quite a bit. I would go ahead and start the estrogen ring which apparently helps this as well as her atrophic vaginitis. I see no contraindication from my perspective.

## 2010-10-29 NOTE — Assessment & Plan Note (Signed)
As per bladder a bladder prolapse assessment. I see no GI contraindication to the hormone ring insertion.

## 2010-10-30 NOTE — Assessment & Plan Note (Signed)
[facility]                         GASTROENTEROLOGY OFFICE NOTE   NAME:[name] [name], [name] [name]                  MRN:          [id number]  DATE:08/10/2007                            DOB:          1917-01-17    CHIEF COMPLAINT:  Followup of anemia and weight loss.   Ms Sterbenz had a rough couple of weeks or longer.  Her son came to  town, they were visiting and it was his birthday.  She had some rich  cake, as she describes it.  The following day she went out to [facility] and  [employer] and had a heavy breakfast, and within 45 minutes, had severe  nonpainful, but explosive, urgent diarrhea with incontinence.  This  recurred within 48 hours, and then she had loose bowel movements for  several days.  Over time, she has gradually improved, but she said it  took three weeks to get back to normal.  She is not having any fever,  chills or bleeding..  There is no nausea or vomiting.  She did not have  mucus but the stools were black and dark, though she is on iron.  She  wonders why her iron level is not really rising that quickly.  In the  past she did have a celiac workup with antibodies.  She had an  antigliaden antibody that was negative, but then on a followup total  celiac panel though Dr. Corinda Gubler it was negative.  She is feeling better.  Her weight is down again 4 pounds.  She was 109 pounds in December, now  she is 105 pounds.   PAST MEDICAL HISTORY:  Reviewed and unchanged from the note of June 12, 2007.   MEDICATIONS:  Listed and reviewed in the chart as are allergies.  She is  now down to Prilosec once a day.  She remains on Tan dim.   PHYSICAL EXAMINATION:  Well developed, well nourished, elderly white  woman.  Weight 105 pounds, pulse 68, blood pressure 124/70.  The eyes  are anicteric.  She is alert and oriented x3.   ASSESSMENT:  1. Iron deficiency.  She has actually not had anemia, but she has had      iron deficiency of unclear etiology.   She is certainly slight and      fits the profile for somebody with celiac disease, though her      antibodies have been negative.  2. Person history of colon cancer and polyps.  Last colonoscopy was in      July of 2006.  We are getting to the point where we could consider      a repeat, though we have held off on that.  She has not given any      overt signs of cancer overall, and her other surveillance measures      have been negative.  She is now approximately seven years out from      her colon cancer diagnosis.   PLAN:  We will go ahead and check an EGD.  I think the diarrheal illness  she had was probably a gastroenteritis type problem, perhaps infectious.  That seems resolved.  Depending upon the results of the EGD (strongly  consider small bowel biopsies), a colonoscopy could be  appropriate, but I am not inclined to pursue that at this time.  We will  have to see how she does.  She is overall much improved on Align and  MiraLax for chronic irritable bowel type complaints.     Iva Boop, MD,FACG  Electronically Signed    CEG/MedQ  DD: 08/10/2007  DT: 08/10/2007  Job #: [id number]   cc:   Wilber Bihari. Caryn Section, M.D.  Rosalyn Gess Norins, MD  Genene Churn. Cyndie Chime, M.D.  Luis Abed, MD, [facility]  Daniel L. Eda Paschal, M.D.

## 2010-10-30 NOTE — Assessment & Plan Note (Signed)
[facility]                           PRIMARY CARE OFFICE NOTE   NAME:[name] [name], [name] [name]                  MRN:          [id number]  DATE:12/08/2006                            DOB:          02-28-1917    REASON FOR VISIT:  Ms. Aguila is a delightful 77 year old woman who  fell in the bathroom at her home this morning.  She struck the back of  her head sustaining a laceration to the occiput.  She had no loss of  consciousness.  She has had no other symptoms or complaints except for  some discomfort and soreness in her hamstrings with no limitation in  activity.  The patient presented to the office for evaluation and  treatment.   PHYSICAL EXAMINATION:  VITAL SIGNS:  No vital signs were obtained.  GENERAL APPEARANCE:  The patient was awake, alert and oriented.  HEENT:  She had no pallor.  She had no light headedness or dizziness.  NEUROLOGICAL:  Neurological exam revealed the patient to be, as noted,  awake, alert and oriented to person, place, time and context.  Cranial  nerves II-XII were intact.  Motor strength was normal.  Cerebellar  function was normal.  DERMATOLOGIC:  Exam reveals the patient to have a 2 inch laceration in  vertical plane at the occiput/base of the skull.  This area was bleeding  freely.   PROCEDURE:  Laceration repair.  The area around the wound was shaved  free of hair.  The wound was then prepped with Betadine, anesthetized  with Xylocaine with epinephrine.  After making sure there was no foreign  objects or hair in the wound, and that there was fresh bleeding, the  wound was then closed using 3-0 Ethilon sutures, three sutures were  placed.   The patient tolerated this procedure well.  There was good control of  bleeding at the end of the procedure.   PLAN:  The patient was given routine wound precautions.  She may have  her hair shampooed.  I have advised her not to use a comb across her  scalp.  She is to return  on December 17, 2006 for suture removal or sooner on  an as-needed basis.     Rosalyn Gess Norins, MD  Electronically Signed    MEN/MedQ  DD: 12/09/2006  DT: 12/09/2006  Job #: [id number]

## 2010-10-30 NOTE — Assessment & Plan Note (Signed)
[facility]                            CARDIOLOGY OFFICE NOTE   NAME:Hanshaw, [name] [name]                  MRN:          [id number]  DATE:12/16/2006                            DOB:          [birth year]    Ms. [name] is doing well.  Her husband passed away and she continues  to move forward despite missing him and her medical problems.  She looks  bright today.  She told me about her grandchildren who are graduating  from professional schools, and she is doing well.  She did slip and fall  recently by stepping on a wet spot in her own bathroom.  She did not  have syncope.  She is not having chest pain or shortness of breath.  She  is going about full activities.   PAST MEDICAL HISTORY:   ALLERGIES:  DEMEROL.   MEDICATIONS:  Prilosec.  Synthroid.  Aspirin.  Calcium.  Gabapentin.  Multivitamins.  Vitamin D.  MiraLax.   OTHER MEDICAL PROBLEMS:  See the list below.   REVIEW OF SYSTEMS:  Other than the HPI, her review of systems is  negative.   PHYSICAL EXAM:  Blood pressure 117/70 with a pulse of 79.  The patient is oriented to person, time, and place.  Her affect is  normal.  HEENT:  Reveals no xanthelasma.  She has normal extraocular motion.  I  do not hear carotid bruits today.  She has no jugular venous distension.  LUNGS:  Clear.  Respiratory effort is not labored.  CARDIAC:  Reveals an S1 with an S2.  She has a 2/6 systolic murmur.  Her abdomen is soft.  She has normal bowel sounds.  There is no  peripheral edema.   ELECTROCARDIOGRAM:  Reveals no significant change from the past.  There  is sinus rhythm.   PROBLEMS:  1. History of resection for colon cancer.  2. Hypothyroidism.  3. Spinal stenosis.  4. Hyperlipidemia, which is being followed.  She does not need      medicines at this time.  She does not have proven coronary disease.  5. Diverticulosis.  6. Gastroesophageal reflux disease.  7. Hypertension, treated.  8. History of  carotid bruits heard intermittently in the past with no      marked abnormality by Doppler.  9. Idiopathic peripheral neuropathy.  10.Status post grief from the loss of her husband.  11.Recent slipping, falling, and injuring her coccyx bone, but she is      stable.   No change in her meds and her cardiac status is stable.  Follow up in 1  year.     Luis Abed, MD, [facility]  Electronically Signed    JDK/MedQ  DD: 12/16/2006  DT: 12/16/2006  Job #: [id number]

## 2010-10-30 NOTE — Assessment & Plan Note (Signed)
[facility]                         GASTROENTEROLOGY OFFICE NOTE   NAME:[name], [name]                  MRN:          [id number]  DATE:03/27/2007                            DOB:          23-Aug-1916    CHIEF COMPLAINT:  Constipation.   PROBLEMS:  1. Chronic constipation and irritable bowel syndrome.  2. Lax anal sphincter.  3. Prior colon cancer resected 2002, dimunitive polyp on 2006 followup      exam.  4. Hypothyroidism.  5. Spinal stenosis.  6. Dyslipidemia.  7. Diverticulosis.  8. Gastroesophageal reflux disease.  9. Hypertension.  10.Idiopathic peripheral neuropathy.   She has also had a mild anemia and a ferritin of 11 back in June.  She  was on a multivitamin with iron, but stopped that.   Other medications are listed and reviewed in the chart.   There is a question of abnormal parathyroid, though I do not have that  information.   HISTORY:  She is having some defecation problems which might be worse.  She has previously been a patient of Dr. Corinda [name].  She is grieving some  of her husband's death, it sounds like.  She gets an urge to go, but has  a lot of difficulty forcing it out at times, but there are times she has  a normal bowel movement.  She is taking Metamucil 3 times a day.  She is  taking a teaspoon of MiraLax daily.  She has a lot of bloating and  gaseousness at times as well.   REVIEW OF SYSTEMS:  She has anxiety, she says.  She has had some skin  rashes lately.  She had restricted wheat from her diet.  She tried to  avoid milk as well, and now she is increasing fiber, and thinks that is  helping some.  She is a bit circuitous in her history.  She is due to  see Dr. Stevphen [name] in the next week or so regarding allergy testing.  She has been restricting citric-type foods, and other foods that cause a  flare up of her reflux disease, and she has been doing okay with that.   PHYSICAL EXAMINATION:  Weight 105  pounds (she has lost 3 pounds).  Pulse  80.  Blood pressure 118/60.  ABDOMEN:  Slightly distended.  Soft.  Nontender.  No organomegaly or  mass.   ASSESSMENT:  1. Constipation predominant irritable bowel syndrome.  2. Personal history of colon cancer.  3. Iron deficiency anemia.   PLAN:  1. Increase MiraLax to 1 tablespoon a day.  2. She is probably going to need to retry iron.  We will check a CBC      and a ferritin today.  3. Continue Metamucil or Citrucel.  4. Trial of Align 1 daily.  5. Return to see me in 6 weeks for reassessment.  She is concerned      about this weight loss and the possibility of recurrent colon      cancer.  At 6 years out from her other diagnosis, she is certainly      cured of her primary tumor it  seems.  Recurrence is possible, but      seems very unlikely.  The iron deficiency as noted, it could be due      to diet, and we will have to think about that and whether or not      she needs further workup at this advanced age.   Note, she has no bleeding.  Her GI symptoms really are chronic.  She has  been tested for celiac disease, and that has been unrevealing.     Iva Boop, MD,FACG  Electronically Signed    CEG/MedQ  DD: 03/27/2007  DT: 03/28/2007  Job #: [id number]   cc:   [name] Gess. Norins, MD  [name] [name], M.D.  [name] Abed, MD, [facility]

## 2010-10-30 NOTE — Assessment & Plan Note (Signed)
[facility]                            CARDIOLOGY OFFICE NOTE   NAME:[name] [name]                  MRN:          [id number]  DATE:12/17/2007                            DOB:          1917/02/15    Ms. [name] is here for cardiology followup.  She is really doing  well.  She is not having chest pain.  She is active in her exercise  program.  She is asking me about her cholesterol and we are trying to  look up the final number as one is missing in the report that she  received.  She is really doing very well.   ALLERGIES:  DEMEROL.   MEDICATIONS:  Synthroid, aspirin, gabapentin, calcium, vitamin D,  Actonel, Candin, Saline nasal spray, and Prilosec.   OTHER MEDICAL PROBLEMS:  See the list below.   REVIEW OF SYSTEMS:  She is really feeling great.  Her review of systems  is negative.   PHYSICAL EXAMINATION:  VITAL SIGNS:  Blood pressure is 120/65 and pulse  is 70.  GENERAL:  The patient is oriented to person, time, and place.  Affect is  normal.  HEENT:  Reveals no xanthelasma.  She has normal extraocular motion.  NECK:  There are no carotid bruits.  There is no jugular venous  distention.  LUNGS:  Clear.  Respiratory effort is not labored.  CARDIAC:  S1 and S2.  There are no clicks or significant murmurs.  ABDOMEN:  Soft.  She has no peripheral edema.   EKG reveals sinus rhythm.   Problems include,  1. History of resection for colon cancer.  2. Hypothyroidism.  3. Spinal stenosis.  4. Hyperlipidemia.  I have chosen not to treat her.  Unfortunately, I      do not yet have all of her recent lipids and when they come, I will      talk to her about this.  5. Diverticulosis.  6. GERD.  7. Hypertension treated.  8. History of carotid bruits but no marked abnormalities by Doppler.  9. Idiopathic peripheral neuropathy.   [name] [name] cardiac status is stable.  She really is doing well.  I  will see her back in 1 year in  followup.     Luis Abed, MD, [facility]  Electronically Signed    JDK/MedQ  DD: 12/17/2007  DT: 12/18/2007  Job #: [id number]

## 2010-10-30 NOTE — Assessment & Plan Note (Signed)
[facility]                           PRIMARY CARE OFFICE NOTE   NAME:[name] [name], [name] [name]                  MRN:          [id number]  DATE:12/12/2006                            DOB:          1917/06/17    [name] was seen for a laceration to her head on Wednesday the 25.  This wound was closed with 3 interrupted sutures of 3-0 Ethilon.  Patient returned to see me on Friday the 27 for followup.  She has  remained clear in her cognitive function with no slurred speech, no  difficulty with word finding or other neurologic symptoms.  She has  continued to complain of significant pain in her low back.  She also  reports she has had significant discomfort with defecation and has had  some bright red hematochezia.  She has been taking stool softeners and  this seems to have helped some.   EXAMINATION:  Revealed the patient to be awake, alert, and oriented.  Her temperature was 96.9, blood pressure 131/75, weight is 110.  GENERAL APPEARANCE:  This is a pleasant woman looking younger than her  stated chronologic age.  BACK:  Patient had tenderness at the low back at the level of the  superior crest of the hip.  She was able to stand without difficulty.  She had a normal gait.  She did have tenderness to palpation.  No  further exam conducted.   Pelvic x-rays:  This did reveal the patient to have a lateral fracture  of her sacrum at about S3-4.   IMPRESSION AND PLAN:  1. Head laceration.  Patient's wound was examined and is doing well.      She has normal cognitive function with no evidence of any change,      making a subdural hematoma very unlikely.  2. Fracture of the pelvis/sacrum.  Patient has a sacral fracture,      quite obvious on a lateral film.  She does have significant pain.      She reports that defecation is easier at this point, she is able to      ambulate without difficulty.  She reports the pain as bearable.   TELEPHONE FOLLOWUP:   On December 14, 2006 I spoke with the patient this  morning.  She is reporting to me that she is doing well except for some  mild discomfort.  She has had no recurrent episodes of hematochezia.  Her bowel movements are easier.   The patient will return to see me on Wednesday July for suture removal.  We will assess her at time in regards to bowel function.     Rosalyn Gess Norins, MD  Electronically Signed    MEN/MedQ  DD: 12/14/2006  DT: 12/14/2006  Job #: [id number]

## 2010-10-30 NOTE — Assessment & Plan Note (Signed)
[facility]                         GASTROENTEROLOGY OFFICE NOTE   NAME:[name], [name]                  MRN:          [id number]  DATE:05/01/2007                            DOB:          1917/03/27    CHIEF COMPLAINT:  Probable weight loss, irritable bowel.   The Align helped her irritable bowel tremendously, she says.  She had  some harsh Kashi cereal which upset her stomach but otherwise she has  done well.  I put her on Tandem for an iron deficiency.  Her ferritin is  up from 12 to 25.  Her hemoglobin remains normal.  She has seen scant  bright red blood per rectum at times, once or twice.  She has known  problems with stool seepage after she defecates, due to a lax sphincter  from prior hemorrhoidectomy.  She is very concerned about the weight  loss, though she has stabilized.  She says she eats a lot and she  perseverates about having a recurrent cancer.  Her nephew is a  Solicitor in [place], [place], and she tells me he does not  want her to have a colonoscopy, just like Dr. Corinda Gubler did not want her  to.  I explained to her that if she needed it we would do it but that I  did not think that was the case at this time.  Her medications are  listed and reviewed in the chart.  Allergies reviewed and updated.  Past  medical history reviewed and unchanged.  She is due to see Dr.  Cyndie Chime in January.   Bowels are regular at this time.  Not significantly constipated, again  attributed to the Align.   PHYSICAL:  Weight 104.8 pounds, she was 105 pounds 6 weeks ago.  There  is a 0.2 pound difference.  She is 5 feet, pulse 84, blood pressure  156/80.  ABDOMEN:  Soft, nontender.  INSPECTION OF THE RECTAL EXAM:  Shows an obviously lax sphincter with  prior surgical scars from hemorrhoidectomy.  RECTAL EXAM:  In the presence of female medical staff, demonstrates iron  colored stool.  She clearly has some inflamed hemorrhoids in the  anal  canal.   ASSESSMENT:  1. Irritable bowel syndrome.  2. History of colon cancer, last colonoscopy July of 2006 with a      diminutive polyp and diverticulosis.  3. Lax anal sphincter status post hemorrhoidectomy with some active      hemorrhoids now.  4. Anxiety.  5. Her weight loss is stabilized.   PLAN:  1. Continue Align.  2. She is reassured numerous times.  3. Keep followup with Dr. Cyndie Chime.  4. Continue the iron for the iron deficiency.  I had considered      hemoccults, but they could very well be positive from these      hemorrhoids so I do not think they would be useful and she had a      negative colonoscopy two years ago.   Plan:  I will see her back in 8 weeks.  She sees Dr. Cyndie Chime.  If he  thinks any other testing is indicated he  can.  If she is losing weight  in the interim she will call me, but she has stabilized.  She was placed  on Tandem for her low iron levels.  She uses some Xanax which helps, I  raised the question of an SSRI but would defer to Dr. Debby Bud.  Perhaps  that would help her worry less if she takes it every day, though  polypharmacy could be an issue for an 77 year old woman.  I think she  does grasp that there is no good evidence for cancer here but she cannot  shake that feeling.  [name], MD (she has been evaluated and  treated for recurrent vaginal discharge there).     Iva Boop, MD,FACG  Electronically Signed    CEG/MedQ  DD: 05/01/2007  DT: 05/01/2007  Job #: [id number]   cc:   Rosalyn Gess. Norins, MD  Genene Churn. Cyndie Chime, M.D.  Daniel L. Eda Paschal, M.D.  Luis Abed, MD, [facility]

## 2010-10-30 NOTE — Assessment & Plan Note (Signed)
[facility]                         GASTROENTEROLOGY OFFICE NOTE   NAME:[name], [name] MCLAINE                  MRN:          [id number]  DATE:11/19/2006                            DOB:          Mar 15, 1917    [name] [name] comes in with a number of questions about her parathyroid and  parathyroid studies that Dr. Eda Paschal did and some __________  antibody  studies which were abnormal that she  did not interpret. Then she  described her bowel movements and some more normal than others depending  upon what she eats and the fiber and so on and so on. She is really  getting somewhat of a phobia about all of this unfortunately but  understandably. She says she is taking Flora-Q and increased her fiber  and this has helped. I told her I would check a blood test for sprue and  a celiac profile. I think she felt better about this. She also is  anxious because she said she had some mucous in her stool and Dr. Kinnie Scales  told her that this indicated possibly cancer.   PHYSICAL EXAMINATION:  GENERAL:  For her age, she looks great.  VITAL SIGNS:  She weighs 108, blood pressure 131/68, pulse 70 and  regular.  Neck, heart and extremities are all unremarkable.   IMPRESSION:  1. History of colon resection for colon cancer.  2. History of hypothyroidism.  3. Hyperlipidemia.  4. Diverticulosis.  5. Hypertension.  6. Anxiety and depression.  7. Questionable abnormal parathyroid and/or sprue __________ symptoms      to suspect.   RECOMMENDATIONS:  Get blood tests as noted and I will have her followup  with Dr. Leone Payor in the future after my retirement.     Ulyess Mort, MD  Electronically Signed    SML/MedQ  DD: 11/19/2006  DT: 11/20/2006  Job #: [id number]

## 2010-11-02 NOTE — Assessment & Plan Note (Signed)
[facility]                           GASTROENTEROLOGY OFFICE NOTE   NAME:[name] Small, [name] Small                  MRN:          [id number]  DATE:05/05/2006                            DOB:          Apr 26, 1917    [name] Small comes in, says she is a little concerned because she has had some  bright red blood with wiping on one occasion.  This is approximately two  weeks ago.  It was after a large bowel movement.  She has done her  Hemoccults.  We do not the results of it.  She is concerned that there is  maybe a polyp, as she says her stools are becoming more narrow.  Our last  colonoscopic examination on her was in July, 2006 and one 3 mm polyp was  seen, which we cauterized.  She had some decreased anal sphincter tone and  some mild diverticular disease.  There was no other pathology seen  endoscopically.   PHYSICAL EXAMINATION:  VITAL SIGNS:  On physical examination today, she  weighed 108.  Blood pressure 110/64, pulse 64 and regular.  Examination was basically unremarkable.  RECTAL:  Deferred.   IMPRESSION:  1. Rectal bleeding.  The patient has had multiple polyps in the past of      her colon and has known mild diverticular disease and a decreased anal      sphincter tone.  Has had a hemorrhoidectomy in the past and has had a      cancer in her right colon which has been resected.  Now she has a great      deal of anxiety about it.  2. Hypothyroidism.  3. History of spinal stenosis.  4. History of hyperlipidemia.  5. Gastroesophageal reflux disease.  6. Hypertension.  7. Idiopathic peripheral neuropathy.   RECOMMENDATIONS:  We give her some AnaMantle cream or equivalent.  I gave  her a prescription for this.  I told her we would check these Hemoccults.  If they were positive, then I thought we ought to investigate a little bit  further, maybe with a flex sigmoid.  I will give her some AcipHex proton  pump inhibitors for her GERD and told her to  let us know if she had any  further difficulty.     Ulyess Mort, MD  Electronically Signed    SML/MedQ  DD: 05/05/2006  DT: 05/06/2006  Job #: [id number]

## 2010-11-02 NOTE — Assessment & Plan Note (Signed)
[facility]                         GASTROENTEROLOGY OFFICE NOTE   NAME:Scholle, HORTENSIA DUFFIN                  MRN:          [id number]  DATE:06/26/2006                            DOB:          14-May-1917    Tyrika comes in and reads me a 3 week daily bowel activity report.  Occasionally, she has had some large stools with specks of blood on the  tissue and so on, but nothing of a significance.  She said one time she  thought there was a large amount of blood, but her hemoglobin seems not  to ever be a problem with droppage, and she watches it carefully, and  Dr. Cyndie Chime watches her carefully.  She has lost no weight.  She had  a CT scan done a year ago, it was totally normal.  She really has not  any change in bowel habits, she just is very anxious about her stools,  the caliber has changed a little bit.  I think she is just always a  little more anxious about this situation because of the cancer and the  difficulties she had with it initially.  But, in any case, I just tried  to reassure her.   I examined her carefully.  Her weight was 107, which is really only a  minimal drop from what it has been.  Her color is good.  Her neck was  negative.  Her oropharynx was negative. Her lungs were clear.  Heart  revealed a regular rhythm.  Her abdomen was soft, no masses or  organomegaly.  There was some slight distension but nontender.  Rectal  examination was done, and I found nothing in the on rectal examination.  Stool was negative for occult blood.  There was not even a trace.  There  were no external hemorrhoids noted.  She had no inguinal nodes detected  or supraclavicular nodes.   She questioned me about Barrett's esophagus.  I do not have her first  chart where I had done an upper endoscopy on her to determine if she has  had Barrett's, but I certainly never mentioned it to her.  I would  certainly not think that there was any evidence  previously of this, but  I will check her chart just in case.   IMPRESSION:  1. Intermittent rectal bleeding, probably hemorrhoidal.  2. Status post colon cancer with right colon resection.  3. Hypothyroidism.  4. History of spinal stenosis.  5. Gastroesophageal reflux disease.  6. Hypertension.  7. Idiopathic peripheral neuropathy.  8. Hyperlipidemia.   RECOMMENDATIONS:  Continue as is.  Continue taking Prilosec.  I told her  that if there were any evidence of further bleeding just to let us know,  use some AnaMantle cream, Wet Ones,  baby wipes. I really did not feel that another followup colonoscopic  examination was in order at  this time.  I think she was reassured by this.  I told her I would check  on her potential Barrett's, and let her know if there was any problem.     Ulyess Mort, MD  Electronically Signed  SML/MedQ  DD: 06/26/2006  DT: 06/26/2006  Job #: [id number]

## 2010-11-02 NOTE — H&P (Signed)
NAMEZANIAH, TITTERINGTON NO.:  [id number]   MEDICAL RECORD NO.:  [id number]          PATIENT TYPE:  EMS   LOCATION:  ED                           FACILITY[facility]  [facility]   PHYSICIAN:  Rod Holler, MD     DATE OF BIRTH:  1917/04/21   DATE OF ADMISSION:  08/21/2006  DATE OF DISCHARGE:                              HISTORY & PHYSICAL   PRIMARY CARE PHYSICIAN:  Dr. Debby Bud.   CHIEF COMPLAINT:  Altered mental status.   HISTORY OF PRESENT ILLNESS:  Ms. Mangrum is an 77 year old female who  presented to the emergency department after an episode of altered mental  status at her retirement home tonight.  After dinner, the patient had an  episode of altered mental status with an unsteady gait.  She does not  remember much of the event and no one is here to inform me of any other  details of this event.  At current, she feels fine.  She has no  complaints of headache, no slurred speech, no visual changes, no  paresthesias or sensation changes, no change in any of her strength.  She has had no complaints of palpitations.  No syncope or presyncope.   PAST MEDICAL HISTORY:  1. Colon cancer, status post resection.  2. Hypothyroidism.  3. Hyperlipidemia.  4. Hypertension.  5. Fibroids.  6. GERD.  7. Irritable bowel syndrome.  8. Peripheral neuropathy.   MEDICATIONS:  1. Evista 60 mg p.o. daily.  2. Ambien 5 mg p.o. nightly.  3. Aspirin 81 mg p.o. daily.  4. Calcium 600 mg p.o. daily.  5. Multivitamin 1 tablet p.o. daily.  6. Neurontin 600 mg p.o. daily.  7. Synthroid 75 mcg p.o. daily.  8. Xanax p.r.n.  9. Prilosec 20 mg p.o. b.i.d.   ALLERGIES:  DEMEROL.   SOCIAL HISTORY:  The patient lives in a retirement home.  Does not  smoke.   FAMILY HISTORY:  Noncontributory.   REVIEW OF SYSTEMS:  All systems are reviewed in detail and are negative  except found in the history of present illness.   PHYSICAL EXAMINATION:  Blood pressure 114/53 lying, 92/60 standing,  heart  rate 92, respiratory rate 18, oxygen saturation is 96%.  GENERAL:  A thin, elderly female, alert and oriented x3.  No apparent  distress.  HEENT:  Normocephalic and atraumatic.  Pupils are equal, round, and  reactive to light.  Extraocular movements intact.  Neck supple.  No  adenopathy, no JVD, no carotid bruits.  CHEST:  Lungs clear to auscultation bilaterally.  CORONARY:  Regular rhythm, normal rate, normal S1-S2.  A 1/6 systolic  ejection murmur, 2+ peripheral pulses.  ABDOMEN:  Soft, nontender, and nondistended.  EXTREMITIES:  No clubbing, cyanosis, or edema.  NEUROLOGICAL:  No focal deficits.   LABS:  White blood cell count 5.7, hematocrit 33.8, platelet count 173.  Sodium 131, potassium 3.5, chloride 101, bicarb 26, BUN 18, creatinine  0.8, glucose 124, total bilirubin 0.5, alkaline phosphate 67 and calcium  8.1.  SGOT 37, SGPT 17, total protein 6.3, albumin 3.2.  Urinalysis  specific gravity 1.019, moderate leukocytes, 0-2 white  blood cells, rare  bacteria.  CT of the head shows no acute bleed, chronic white matter  disease, atrophy.   IMPRESSION:  Ms. Quant is an 77 year old female who presented with  an episode of unsteady gait and altered mental status, questionable  transient ischemic attack.   PLAN:  1. Admit the patient to a telemetry bed.  EKG now.  Transesophageal      echocardiogram.  2. Home medicines.  3. Endocrine thyroid function test.  4. Regular diet.  5. The patient is a fall risk.      Rod Holler, MD  Electronically Signed     TRK/MEDQ  D:  08/22/2006  T:  08/22/2006  Job:  [id number]

## 2010-11-02 NOTE — Consult Note (Signed)
[facility]  Patient:    [name] Small, [name] Small            MRN: [id number] Proc. Date: 10/18/00 Adm. Date:  [id number] Attending:  Carson Myrtle CC:         Sheppard Plumber. Earlene Plater, M.D.  Luis Abed, M.D. [facility]  Richard F. Caryn Section, M.D.  Daniel L. Eda Paschal, M.D.  Ulyess Mort, M.D. [facility]   Consultation Report  cc: Rosalyn Gess. Norins, M.D. [facility]     [facility]  SUMMARY:  The patient is an 77 year old female, referred to [place] for discussion of treatment of colon carcinoma.  Past medical history is significant for chronic irritable bowel syndrome of constipation and diarrhea most of her adult life.  She had diarrhea about two months ago that lasted for about 2-1.2 weeks and lost about 8 pounds of weight.  She saw her gynecologist, Dr. Eda Paschal, for a routine exam and was found to have Hemoccult positive stool.  She wanted to be worked up further with an abdominal and pelvic CT scan.  These studies were negative for any evidence of widespread disease.  Chest x-ray also was negative for any evidence of any lung metastasis.  She subsequently went on to have colonoscopy done by Dr. Corinda Gubler that showed adenocarcinoma.  This is present in the cecum.  She subsequently underwent a right colectomy yesterday along with appendectomy and tolerated the procedure well.  Her pathology report from that procedure has returned, [id number], showing a grade 2 adenocarcinoma with mucinous features.  The tumor does invade the muscularis propria.  Margins were free with 1 of 16 pericolonic lymph nodes positive for metastatic involvement.  Thus, she had a pathologically staged T2, N1 tumor or a Dukes C2.  Again, this is a fairly small primary 2.2 cm in size.  She today, is one day postop.  Past medical history is significant for spinal stenosis with degenerative disk disease.  She also has history of fibrocystic breast disease, although she  had mammograms earlier this year that were negative.  She has had mitral valve prolapse in the past.  She has had some irritable bowel symptoms and hypothyroidism x 5 years.  She had benign colonic polyps in 1962.  She has had multiple colonoscopies since that time.  She has had a hemorrhoidectomy in 1961, a tonsillectomy in the past.  Multiple skin cancers removed over the years.  FAMILY HISTORY:  Positive for coronary artery disease.  Maternal grandfather with gastric cancer.  SOCIAL HISTORY:  Lives in [place] with her husband.  She has two sons, one of whom is here tonight, Cletis Athens.  She has another son, Molly Maduro.  Her sons live in [place].  No history of alcohol or tobacco use.  No prior blood transfusions. She does apparently have a living will in place, although we did not discuss code status tonight.  ALLERGIES:  DEMEROL causes a hypotensive reaction.  MEDICATIONS:  Synthroid, Evista, Nexium, Pepcid, Neurontin, Xanax, multivitamins, and Mylanta.  REVIEW OF SYSTEMS:  No pain prior to surgery.  She has been quite active in the past with an excellent performance status.  She does have frequent reflux pain as well as some occasional abdominal cramps.  She has had some stress incontinence in the past.  PHYSICAL EXAMINATION:  VITAL SIGNS:  She is 5 foot tall, weighs 106 pounds, temperature 99, pulse 84, respirations 16, blood pressure 114/60.  HEENT:  Sclerae anicteric, no thyromegaly.  Mucous membranes are moist.  There is no evidence of any ulcers.  NODES:  No cervical, axillary, or inguinal adenopathy.  CHEST:  Few fine rales in the left lower base.  HEART:  Grade 2/3 systolic murmur.  No gallops.  Regular rate and rhythm.  ABDOMEN:  Some bowel sounds.  Some appropriate tenderness to palpation one day postop.  EXTREMITIES:  No clubbing, cyanosis, or edema.  Strength 5/5.  NEUROLOGIC:  Unremarkable.  Her pathology report is as noted above.  X-ray reports included her  CT reports from April 24, and chest x-ray from April 22.  Her laboratory studies preoperatively showed a hemoglobin of 10.7, creatinine .8, BUN 14, glucose 100.  It is of note that her preoperative CEA level was within normal limits. Plus that will probably not be a very useful tumor marker for [place].  ASSESSMENT:  Elderly female status post right colectomy with a grade 2 fairly small primary, a little over 2 cm in size but 1 of 16 nodes positive.  PLAN:  As I told them tonight in a long conversation.  She is a borderline chemotherapy candidate for several reasons.  I told them that indeed, if she did not have any nodes positive and had a stage B2 lesion, we really would not even consider chemotherapy for her given the fact that her lesion is not bulky.  With one node positive, it puts her into a [facility] C-II category which is at statistically would show benefit to chemotherapy probably reducing the risk of cancer by about 30%.  She understands that we really do not know whether or not she has any cancer cells left at this time.  She may well be cured.  I say there is probably about a 50-50 chance this cancer would come back given the overall information we know about it without therapy.  Again, she understands we could reduce that risk by about a third.  The biggest question is whether or not we should do treatment at all.  I think a lot of that will depend on how quickly she bounces back over the next 2-3 weeks.  Right now, her weight is a bit borderline, and she is a bit frail.  If she recovers quite well, then all of a sudden it was look less concerning to do the therapy, or at least try it.  If would involve six months 5-FU  leucovorin, either one week out of four or on a weekly basis.  Also mentioned a second opinion by GI oncology, Dr. Charlean Sanfilippo over at [facility].  She is very interested in getting a second opinion, and I think it would probably be very useful in her  case given the fact that she has a small tumor with one node positive but is elderly and somewhat frail.  For now, I will be meeting with her husband next Monday.  We will again go over these same issues.  We will eventually get home and then see how the first couple of weeks go at home and likely we will go ahead and arrange a second opinion with Dr. Charlean Sanfilippo at [facility].  All questions were answered, and a good discussion was held.  Thank you for allowing me to share in Ms. Schandlers care. DD:  10/18/00 TD:  10/18/00 Job: [id number]

## 2010-11-02 NOTE — Assessment & Plan Note (Signed)
[facility]                         GASTROENTEROLOGY OFFICE NOTE   NAME:Small, [name] YONO                  MRN:          [id number]  DATE:08/13/2006                            DOB:          June 08, 1917    [name] Small comes in because she has had some changes in her bowel habits.  She is very anxious about having possibility of recurring colon cancer  and I can understand this. We have been through this before. Thank  goodness she has a nephew who is a gastroenterologist in [place]  and he concurs with me that she really does not need any further  endoscopic procedures at this time. She says she has had some  constipation. I told her that has to do with her diet and activities and  so on and that I really just felt this was a functional problem that  could be related to her pre and post-surgical changes as well.   Her abdominal examination today is really unremarkable. She otherwise  looks great for her age and her weight is the same and nothing else has  changed. She has had no blood in her stool.   IMPRESSION:  1. History of colon resection for colon cancer.  2. Hypothyroidism.  3. Hyperlipidemia.  4. History of some mild diverticular disease.  5. Hypertension.   RECOMMENDATIONS:  Is that we treat her symptomatically and with some  MiraLax. I told her to cut back on the dose and not to use quite some  much fiber and hopefully this will be helpful to her. I told her to tell  her nephew that I agree with him 100% and hopefully we will be able to  alleviate some of her anxieties that she has.     Ulyess Mort, MD  Electronically Signed    SML/MedQ  DD: 08/13/2006  DT: 08/13/2006  Job #: [id number]

## 2010-11-02 NOTE — Discharge Summary (Signed)
[name] Small, [name] Small           ACCOUNT NO.:  [id number]   MEDICAL RECORD NO.:  [id number]          PATIENT TYPE:  INP   LOCATION:  1423                         FACILITY:  [facility]   PHYSICIAN:  Rosalyn Gess. Norins, MD  DATE OF BIRTH:  03/29/1917   DATE OF ADMISSION:  08/21/2006  DATE OF DISCHARGE:  08/22/2006                               DISCHARGE SUMMARY   ADMITTING DIAGNOSIS:  Mental status change.   DISCHARGE DIAGNOSIS:  Mental status change.   HISTORY OF PRESENT ILLNESS:  Mrs. Marshman is a delightful 77 year old  woman in good health, who was in her usual state of health.  She reports  she had a long and exhausting afternoon at the car repair.  When she  returned back to her apartment at [place], she took a Xanax before  supper.  She went down to the dining room and got through dinner without  any problems, but after dinner was felt by staff to be unsteady on her  feet.  She had no loss of consciousness.  She recalls no focal weakness  or neurologic sign or symptom.  She was escorted back to her room, and  because of concern she was brought to [facility] Emergency Department  for evaluation.  The patient was evaluated in emergency department and  then admitted by the on-call service.   Per the admit note, the patient was normal at time of examination by the  emergency department physician, with no focal neurologic changes or  abnormalities.  The admitting physician, Dr. Samuel Bouche, also found no  abnormalities on physical exam, with the patient being awake, alert,  oriented, with normal neurologic exam.   ADMITTING LABORATORY:  Urinalysis dipstick was negative.  Urine  microscopic revealed 0-2 WBCs. Cardiac markers were negative.  Comprehensive metabolic panel revealed a sodium of 131, potassium was  3.5, chloride 101, CO2 26, BUN 18, creatinine 0.77, glucose was 124, but  his was a postprandial glucose.  CBC revealed a white count of 5700,  with normal differential.   Hemoglobin was 11.6 g.   No imaging studies were performed.   HOSPITAL COURSE:  The patient was admitted to the telemetry and observed  overnight.  She had a quiet night,. with no events reported.   On the morning of discharge, the patient was awake, alert, oriented to  person, place, time, and context.  She had no complaints or problems.  Her speech was clear, and her cognitive function was at its baseline.  Cranial nerves II-XII were grossly intact, with normal facial symmetry  and movement.  Extraocular muscles were intact.  Pupils were equal  round, and reactive.  The patient had no cerebellar abnormalities, with  no pronator drift.  Normal grip strength was noted.  She was able to  stand without assistance.  She had a normal gait and normal turn.   In talking with the patient, I believe that the taking of a Xanax before  dinner cause her to perhaps be slightly sedated, which was the cause of  her initial symptoms.  The [facility] staff in their very attentive  fashion was concerned and initiated  transport.  At this point, I think  the patient has no significant neurologic deficits or problems.  Her  labs are normal. Exam this morning is normal, and I feel she is stable  and ready for discharge home.   DISCHARGE EXAMINATION:  Is as above with vital signs being stable, with  a temperature of 97.  Blood pressure 127/73, heart rate of 73,  respirations were 20, O2 saturations 100% on room air.   DISPOSITION:  The patient is discharged to [facility]. No particular  change in care or followup is required.   She will resume all of her home medications as per medication  reconciliation orders:  1. Evista 60 mg q.a.m.  2. Synthroid 75 mcg daily.  3. Neurontin 600 mg at bedtime.  4. Xanax on a p.r.n. basis.  5. Aspirin 81 mg daily.  6. Ambien 5 mg at bedtime p.r.n.  7. Prilosec 20 mg b.i.d.  8. Multivitamin daily.  9. Zantac 150 mg p.r.n. breakthrough discomfort.  10.Calcium  tablets, 2 tablets to 4 tablets daily.  11.Tums as needed.  12.Citracal as needed.  13.Vitamin D daily.  14.Nasonex spray daily.  15.Eye drops.  16.Tylenol p.r.n.   The patient will be seen in followup in the office on an as-needed  basis.   CONDITION AT TIME OF DISCHARGE DICTATION:  Stable, with no deficits.      Rosalyn Gess Norins, MD  Electronically Signed     MEN/MEDQ  D:  08/22/2006  T:  08/22/2006  Job:  [id number]   cc:   Genene Churn. Cyndie Chime, M.D.  Fax: [phone]

## 2010-11-02 NOTE — Assessment & Plan Note (Signed)
[facility]                             PRIMARY CARE OFFICE NOTE   NAME:[name] Small, [name] Small                  MRN:          [id number]  DATE:01/21/2006                            DOB:          [birth year]    HISTORY OF PRESENT ILLNESS:  [name] Small is a very pleasant 77 year old,  soon to be 77 year old woman, well known to the practice.  She was last seen  in primary care June 18, 2005.  She was most recently seen by Dr. Victorino Dike.   The patient presents today with several questions and issues.   #1 - GI:  The patient is concerned about colon screening and colonoscopy.  She has a history of colon cancer with colectomy in May 2002 and did have  rapid recurrence of malignant polyps.  Her last colonoscopy was in July  2006, and did have minimal polyps that were hot biopsied, not retrieved.  Discussed this at length with the patient including the rationale for  screening.  Given her advanced age and a felt to be normal colonoscopy one  year ago, it would not be prudent to repeat colonoscopy.  I explained to her  the risks and benefits in regards to the risk or perforation versus the very  small benefit of early detection given her advanced age and life expectancy.  At the end of the discussion, she did seem comfortable and understand that  at this time colonoscopy was not really warranted, nor was she at very high  risk.   #2 - Low back pain:  The patient reports she has been having pain in the  right lower flank.  She reports she has been seen by Dr. Jethro Bolus  with issue of what sounds like mild stress incontinence.  She reports she  had a full evaluation.  Her current symptoms do not sound to be urologic in  nature and do seem to be movement related suggesting a musculoskeletal  origin.   #3 - Psychosocial:  The patient has been widowed for a little more than a  year.  She has occasional anxiety.  She also has some sleep latency  insomnia  for which she has been using Xanax as opposed to Ambien for fear of addicted  nature of Ambien.  Discussed this at length with her explaining that Ambien  would be a better choice for insomnia given its lack of addiction potential,  lack of habituation and its greater safety profile in regards to the follow  up as opposed to a benzodiazepine.  She may use Xanax for daytime anxiety.   #4 - Cardiovascular:  The patient did recently see Dr. Jerral Bonito on December 27, 2005, and was felt to be stable for cardiac perspective.   #5 - Lipids:  The patient did have a lipid profile on December 31, 2005, with a  total cholesterol of 204 and LDL of 121, HDL of 56.7.  I reassured her that  this was an acceptable value given her low risk for coronary disease with no  prior history of proven coronary disease and lack of other significant  risk  factors, specifically diabetes, poorly controlled hypertension as well as  her age.  She is at goal for the NCEP lines.  No need for intervention.   #6 - GI:  The patient reports she had a stomach ache last Friday which she  attributes to high fiber excess.  She reports she is very concerned about  her bowel habit being regular and actually does adjust her fiber on a daily  basis.   #7 - Hypertension.  The patient was reassured that her blood pressure seems  to be well controlled, at this time at 140/63.   CURRENT MEDICATIONS:  1.  Synthroid 75 mcg daily.  2.  Aspirin 81 mg daily.  3.  Calcium with vitamin D daily.  4.  Gabapentin 600 mg q.h.s.  5.  Citrucel daily.  6.  Multivitamin daily.  7.  Vitamin D daily.  8.  Prilosec b.i.d.  9.  Estrogen Ring placed every three months.  10. Evista.  11. Xanax on a p.r.n. basis.  12. Ambien on a p.r.n. basis.   CHART REVIEW:  The patient did have colonoscopy as noted December 20, 2004.   The patient has been seen by Dr. Lucky Cowboy on several occasions, January  2007, March 2007, May 2007.  It is felt she had  laryngeal pharyngeal reflux  disease and was doing well with b.i.d. Prilosec.  Her allergic rhinitis was  stable with continued use of Nasacort.  I do not have correspondence from  Dr. Patsi Sears.   LABORATORY DATA:  Last laboratory found December 30, 2005, with normal liver  function, cholesterol as previously noted.  The patient had hemoccult cards  that were negative x12.  Last TSH in November 2006 was normal at 0.80.   REVIEW OF SYSTEMS:  The patient has had no CONSTITUTIONAL, CARDIOVASCULAR,  RESPIRATORY complaints.   PHYSICAL EXAMINATION:  VITAL SIGNS:  Temperature 98.2, blood pressure  140/63, pulse 75, weight 108 pounds, up one pound from last recorded weight  in May 2007.  GENERAL APPEARANCE:  This is a well-nourished, spry-appearing woman, looking  younger than her stated chronologic age.  CHEST:  The patient is moving air well with no wheezes, rales or rhonchi.  CARDIOVASCULAR:  Radial pulses 2+.  Had a quite precordium with regular rate  and rhythm.  ABDOMEN:  Soft, no guarding or rebound.  No tenderness in the upper or lower  quadrants.  EXTREMITIES:  Without edema.   ASSESSMENT/PLAN:  1.  GI:  The patient, at this point, does not need colonoscopy and routine      close surveillance through watching her bowel habit is adequate.  The      patient will continue on her fiber regimen.  I have advised use of Milk      of Magnesia for constipation.  The patient reports stomach ache which      has resolved which may be fiber related.  She has no tenderness in      today's exam.  2.  Low back pain.  Suspect this is musculoskeletal in nature with patient's      normal laboratory and lack of urinary tract symptoms.  Plan:  Patient to      use Tylenol on an as needed basis.  3.  Psychosocial:  The patient seems to be doing well with her recent loss.      I have encouraged her to use Ambien as needed for sleep, use Xanax in      the  daytime on an as needed basis for anxiety. 4.   Cardiovascular:  Stable.  5.  Hyperlipidemia:  Stable.  6.  Hypertension:  Well controlled.   SUMMARY:  A very pleasant woman with problems as outlined above.  I would  like to have her return to see me in three months for routine follow up.                                   Rosalyn Gess Norins, MD   MEN/MedQ  DD:  01/21/2006  DT:  01/22/2006  Job #:  [id number]   cc:   Glenford Peers. Caryn Section, MD

## 2010-11-02 NOTE — Discharge Summary (Signed)
[facility]  Patient:    [name] Small, [name] Small            MRN: [id number] Adm. Date:  [id number] Disc. Date: [id number] Attending:  Carson Myrtle CC:         Miquel Dunn. Catha Gosselin, M.D.  Rosalyn Gess Norins, M.D. [facility]  Luis Abed, M.D. [facility]  Richard F. Caryn Section, M.D.  Daniel L. Eda Paschal, M.D.   Discharge Summary  FINAL DIAGNOSES:  Adenocarcinoma of right colon, stage T2, N1.  HISTORY OF PRESENT ILLNESS:  [name] was evaluated as an outpatient with known anemia, a cervical or endometrial polyp.  Dr. Corinda Gubler performed colonoscopy, found a right colon lesion, biopsy proved adenocarcinoma, invasive.  She had a D&C with polypectomy in the interim and has been consulted.  HOSPITAL COURSE:  She was prepared at home, brought into the hospital on Oct 16, 2000, and underwent a right colectomy under general anesthesia.  She had an excellent postoperative recovery with rapid ambulation and early return to bowel function.  There were no complications.  We did attempt to infuse intravenous iron.  The patient was sensitive and was unable to tolerate a second planned dose.  Her final pathology report showed tumor 2.2 cm with free margins, tumor invading the muscularis propria, and 1 of 16 lymph nodes positive.  Her iron level was low at 12.  Her total iron binding capacity was 296.  Her iron saturation percentage was low at 4.  Her admitting hemoglobin was 10.4, hematocrit 33.3.  Sed rate was 28.  her final hemoglobin was 9.9, hematocrit 30.  She was seen in consultation by Dr. Lyndal Pulley and follow-up consultation is planned.  The patient is advised to use multiple vitamin, iron supplements.  She asked for a sleeping pill, Ambien 5 mg, #30, given.  She will use Tylenol for pain. she will continue her other medications.  She will be seen and followed as an outpatient by myself, by Dr. Catha Gosselin immediately, Dr. Eda Paschal as needed, Dr. Corinda Gubler in  follow-up. DD:  10/21/00 TD:  10/21/00 Job: [id number]

## 2010-11-02 NOTE — Assessment & Plan Note (Signed)
[facility]                              CARDIOLOGY OFFICE NOTE   NAME:Small, [name] PELLAND                  MRN:          [id number]  DATE:12/27/2005                            DOB:          25-Jan-1917    [name] is doing well.  She is dealing with the grief over the loss of  her wonderful husband.  She is not having any significant chest pain.  She  was concerned that her knee length panty hose might be causing some venous  obstruction.  I do not believe this is the case.  She also says her diet is  not as good as it has been and we will plan to check a fasting lipid  profile.   Her blood pressure has been well controlled.  She does not have any  significant chest pain.   PAST MEDICAL HISTORY:  ALLERGIES:  DEMEROL.   MEDICATIONS:  Synthroid, aspirin, calcium, gabapentin, Citracal,  multivitamin, Vitamin D, Prilosec b.i.d., estrogen, Est/rng,  Evista, Xanax  p.r.n., TUMS and Ambien.   OTHER MEDICAL PROBLEMS:  See the list below.   REVIEW OF SYSTEMS:  She is really feeling fine.  She mentions some fatigue  but this would seem to be appropriate for this 77 year-old female.  She  continues to be quite active.  Otherwise her review of systems is negative.   PHYSICAL EXAMINATION:  VITAL SIGNS:  Blood pressure is 126/67 with a pulse  of 65.  NEUROLOGIC:  Patient is oriented to person, time and place and her affect is  normal.  LUNGS:  Clear.  Respiratory effort is not labored.  HEENT:  Reveals no xanthelasma.  She has normal extra-ocular motion.  CARDIAC:  Cardiac exam reveals S1 and S2.  There is a soft systolic murmur.  ABDOMEN:  Soft.  There are no masses or bruits.  EXTREMITIES:  She has no peripheral edema.   EKG shows no change.   PROBLEMS:  1.  History of colon resection for colon cancer.  2.  Hypothyroidism .  3.  Spinal stenosis.  4.  Hyperlipidemia.  5.  Diverticulosis.  6.  GERD.  7.  Hypertension - stable.  8.  Carotid  bruits with no carotid abnormalities in the past.  9.  Etiopathic peripheral neuropathy.  10. Status post the loss of her husband.   IMPRESSION:  Patient is stable.  No change in her therapy.                               Luis Abed, MD, [facility]    JDK/MedQ  DD:  12/27/2005  DT:  12/27/2005  Job #:  [id number]

## 2010-11-02 NOTE — Op Note (Signed)
[facility]  Patient:    [name] Small, [name] Small            MRN: [id number] Proc. Date: 01/30/01 Adm. Date:  [id number] Attending:  Carson Myrtle                           Operative Report  PREOPERATIVE DIAGNOSIS:  Cancer of the colon, chemotherapy.  POSTOPERATIVE DIAGNOSIS:  Cancer of the colon, chemotherapy.  PROCEDURE:  Insertion of Port-A-Cath.  SURGEON:  Timothy E. Earlene Plater, M.D.  ANESTHESIA:  Local, standby.  INDICATION:  [name] is currently 62, otherwise healthy, undergoing chemotherapy for colon cancer and had depleted all of her peripheral veins. She has elected to proceed with Port-A-Cath.  DESCRIPTION OF PROCEDURE:  The patient taken to the operating room, placed supine, IV started, sedation given, carefully positioned, padded in comfortable position.  The head of the bed was then put down.  The upper chest was prepped and draped in the usual fashion.  Attempted to isolate the left subclavian by percutaneous stick and did so on the second pass, was able to withdraw blood, inserted the guidewire, but under fluoroscopy, could not manipulate the guidewire into the greater veins.  I then had to go to the right side where the vein was located on the first pass and the wire inserted without difficulty.  The needle site was widened, and the introducer was placed over the wire, ascertained to be in the correct position, and then the catheter over the Port-A-Cath was inserted through the introducer and the introducer removed.  All elements had been irrigated with heparinized saline. A tunnel was then made to the port site which was developed in the subcutaneous space, all under local anesthesia.  The catheter was connected to the port.  The position was good.  The flow was easy.  The port attached to the prepectoral fascia with interrupted 2-0 Prolene x 3.  Again, the position good, and the flow was good.  The wound was closed with  4-0 Monocryl.  Again, the apparatus was flushed with concentrated heparin, and the flow was good. She was removed to the recovery room after Steri-Strips and dry sterile dressing applied.  A portable chest x-ray will be made, and she will be followed as an outpatient. DD:  01/30/01 TD:  01/30/01 Job: [id number]

## 2010-11-02 NOTE — Op Note (Signed)
[facility]  Patient:    [name] Small, [name] Small                  MRN: [id number] Proc. Date: 10/09/00 Adm. Date:  [id number] Attending:  Sharon Mt                           Operative Report  PREOPERATIVE DIAGNOSIS:  Postmenopausal bleeding with abnormal ultrasound.  POSTOPERATIVE DIAGNOSIS:  Postmenopausal bleeding with submucous myoma.  OPERATION:  Hysteroscopy dilation and curettage.  ANESTHESIA:  General.  SURGEON:  Daniel L. Eda Paschal, M.D.  INDICATIONS:  The patient is an 77 year old, gravida 2, para 2, ab 0, who presented to my office in late March with an episode of brown vaginal spotting on several occasions.  She is on Evista, which obviously would not cause the above.  She was evaluated by ultrasound that showed a very thin endometrial stripe at 1.6 mm.  Her endometrial cavity was fluid-filled.  There was an echogenic soft tissue focus seen at the upper cervical canal, anterior and to the right, or in the lower uterine segment of 10 x 4 mm.  It was felt to be probably a polyp.  The patient has just been diagnosed with colon cancer, and she now enters the hospital for hysteroscopy D&C and evaluation of the above to be sure that she does not have an early adenocarcinoma in addition to her colon cancer.  FINDINGS:  External and vaginal is within normal limits.  Cervix was stenotic. Once it was dilated, there was fluid both in the endometrial cavity, and the cervix was filled with an inflammatory gelatinous material, probably due to cervical stenosis, but did not appear to have tissue in it.  The intrauterine cavity was smooth, symmetrical, with the exception of the lesion seen on ultrasound.  This was seen in the lower uterine segment to the right on the wall, and it was a small, submucous myoma of approximately 1 cm.  The lining was completely atrophic.  The rest of the examination was unremarkable. Adnexa are not palpable on pelvic  nor were they abnormal on ultrasound.  DESCRIPTION OF PROCEDURE:  After adequate general anesthesia, the patient was placed in the dorsal lithotomy position, prepped and draped in the usual sterile manner.  A single-tooth tenaculum was placed in the anterior lip of the cervix and using extremely small dilators, the cervix could be forcibly dilated, and then regular dilators could be used up to a [employer].  Following this, ______ polyp forceps were placed in the cervix to see if there was a cervical polyp.  There was just gelatinous material described above which was removed and sent to pathology for tissue diagnosis but no true polyp.  After this, the hysteroscope was introduced.  It was a diagnostic scope attached to a camera for magnification.  Sorbitol 3% was used to expand the intrauterine cavity.  An excellent picture was obtained.  She had an atrophic endometrium, and the only pathology was the area that had been seen on ultrasound that was consistent with a submucous myoma.  Endometrial sampling was then obtained, including the area over the submucous myoma.  It was not felt appropriate to remove the submucous myoma because of her very thin, atrophic uterine wall and the fact that she was already anemic from her colon cancer, and it was not felt that this would aid in diagnosis at all.  At termination of the procedure, there was  no significant bleeding noted.  She was rehysteroscoped to be sure.  Fluid deficit was zero.  Blood loss was less than 50 cc.  The patient left the operating room in satisfactory condition. DD:  10/09/00 TD:  10/10/00 Job: [id number]

## 2010-11-02 NOTE — Op Note (Signed)
[facility]  Patient:    [name] Small, [name] Small            MRN: [id number] Proc. Date: 10/17/99 Adm. Date:  [id number] Attending:  Carson Myrtle CC:         Wilber Bihari. Caryn Section, M.D.  Ulyess Mort, M.D. [facility]   Operative Report  PREOPERATIVE DIAGNOSIS:  Adenocarcinoma right colon.  PREOPERATIVE DIAGNOSIS:  Adenocarcinoma right colon.  OPERATION:  Right colectomy.  SURGEON:  Timothy E. Earlene Plater, M.D.  ASSISTANT:  Currie Paris, M.D.  ANESTHESIA:  CRNA supervised, Dr. Shireen Quan.  INDICATIONS:  [name] is 33 but relatively healthy.  Routine colonoscopy revealed an invasive adenocarcinoma of the right colon, and she is prepared and ready to proceed with a right colectomy.  This has been carefully described to her in the office.  Her preparation for this surgery was carried out at home.  She is admitted following surgery.  DESCRIPTION OF PROCEDURE:  The patient was brought to the operating room after careful evaluation and rehydration in the anesthesia area.  General endotracheal anesthesia administered without complication.  Foley catheter inserted.  PAS hose had been applied.  Nasogastric tube inserted.  Abdomen prepped and draped in the usual fashion.  A short vertical incision was made in the mid abdomen.  Entered the abdominal cavity without complications. General exploration was carried out revealing a normal upper abdominal viscera, normal small bowel, normal palpable colon except for a tumor present at the junction of the cecum and ascending colon.  The pelvis was intact and normal with atrophic ovaries and a slightly enlarged uterus.  Attention was turned to the right colon.  The adhesions and fusion of the omentum were taken down sharply.  The right colon was delivered into the field by division of the lateral peritoneal folds.  We elected to proceed with a right colectomy, isolating the terminal ileum and the right colon  at approximately the junction of the ascending colon with the hepatic flexure.  These two areas were divided between the GIA staple device.  The mesentery was carefully laid out and then divided between clamps down to the posterior peritoneum.  The clamps were carefully tied with silk.  The anastomosis was created by approximating the terminal ileum to the hepatic flexure.  Small incisions were made in each piece of intestine, and the anastomosis was finalized by applying the GIA staple device creating a side-to-side functional end-to-end anastomosis.  Site was inspected.  It was well open.  There was no bleeding, and the puncture sites were closed with a TA-60 staple device.  The subsequent closure was air and watertight.  Colon lay nicely and was well open.  Mesentery was closed with interrupted silk sutures.  Instruments and gloves changed.  Irrigation carried out.  There was a bit of old blood in the pelvis.  This was irrigated. All areas were carefully inspected.  There was no bleeding.  The bowel was laid back into the abdomen in its normal fashion from left upper quadrant to right lower quadrant.  Omentum was draped over the bowel and with the counts correct, the abdomen was closed in a single layer with #1 PDS suture.  Second counts were correct.  The subcutaneous was irrigated and skin was closed with wide skin staples.  She tolerated it well.   Estimated blood loss was 50-100 cc, none replaced.  The patient was stable and was removed to the recovery room in good condition. DD:  10/17/99 TD:  10/16/00 Job: [id number]

## 2010-11-02 NOTE — Consult Note (Signed)
[facility]  Patient:    AMIYAH, [name] Small            MRN: [id number] Adm. Date:  [id number] Attending:  Carson Myrtle Dictator:   Garwin Brothers, N.P. CC:         Lavell Islam, M.D.  Rosalyn Gess Norins, M.D. [facility]  Luis Abed, M.D. [facility]  Richard F. Caryn Section, M.D.  Daniel L. Eda Paschal, M.D.   Consultation Report  Ms. Hegwood is an active 77 year old white female referred to oncology status post right colectomy Oct 16, 2000 for adenocarcinoma of the right colon performed by Kendrick Ranch, M.D.  Ms. Denyse Dago reports having alternating constipation and diarrhea most of her adult life with the most recent episode approximately two months ago.  The diarrhea lasted about two and one-half weeks and she states her weight loss was approximately 8 pounds.  Subsequently she saw her gynecologist for routine examination and was found to have positive occult blood in her stool.  Abnormal pelvic ultrasound was requested for postmenopausal bleeding.  An abdominal and pelvic CT with contrast was negative for mass, acute disease, or metastatic disease.  One cyst less than 1 cm was noted on the left kidney.  Ms. Virag reports a colonoscopy was performed by Dr. Corinda Gubler and was positive for suspicious polyp.  She underwent colectomy and states she had a positive lymph node.  Pathology report is pending.  PAST MEDICAL HISTORY: 1. Spinal stenosis with degenerative disk disease. 2. Fibrocystic disease of breast. 3. Bilateral cataracts. 4. Hard of hearing. 5. Mitral valve prolapse. 6. "Bowel problems" possibly IBS from description. 7. Hypothyroid x 5 years.  PAST SURGICAL HISTORY: 1. Benign colon polyps 1962. 2. Tonsillectomy. 3. Hemorrhoidectomy 1961. 4. Multiple skin cancers removed over past 30 years.  FAMILY HISTORY:  Mother died at 40 of old age.  Father died ate 52 of CAD. Sister is alive at 59 with diabetes mellitus type 2.  Maternal  grandfather died with stomach cancer.  GYNECOLOGICAL HISTORY:  Gravida 2, para 2.  Age at menarche 56.  Age at menopause 89.  Ms. Mione did not nurse her children.  SOCIAL HISTORY:  Ms. Zinger has lived in [place] with her husband, Ree Kida, for the past 23 years.  They have been married 59 years and are of the [place] faith.  Ms. 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  There are two sons, both living in [place] state.  Jonny Ruiz is a [employer].  Home telephone number [phone]. Second son is Molly Maduro who is an Pensions consultant.  Home number [phone].  Ms. Silberstein had full functional status which included regular water and treadmill exercises in her daily activities prior to admission.  HEALTH MAINTENANCE: 1. Last Pap and mammogram March 2002. 2. Colonoscopy one week ago. 3. Cholesterol screening January 2001. 4. Flu vaccine last fall. 5. Unsure of last pneumovax. 6. No alcohol use. 7. No tobacco use. 8. No blood transfusion.  There is a living will in place.  Her healthcare power of attorney is listed as her husband, Ree Kida with each of the sons to follow.  ALLERGIES:  DEMEROL causes drop in blood pressure.  MEDICATIONS: 1. ______ 0.075 mg q.d. 2. Evista on hold at present. 3. Nexium one p.o. q.d. 4. Pepcid or Zantac p.r.n. at h.s. 5. Calcium dosage unknown. 6. Neurontin 600 mg q.h.s. 7. Xanax questionable dosage p.r.n. 8. Multivitamin q.d. 9. Mylanta p.r.n.  REVIEW  OF SYSTEMS:  Ms. Zia denies pain prior to surgery, now with abdominal tenderness, weight loss of 8 pounds two months ago.  Has gained 4 pounds back.  No increased fatigue or night sweats.  Denies headache, nausea, or vomiting.  She has had visual changes secondary to cataracts which are not ready for surgery.  She has had no falls or focal weakness.  She denies cough or shortness of breath.  She does have frequent reflux relieved  with medications and has had severe cramping pain off and on for years with bouts of diarrhea.  Ms. Ressler has had stress incontinence in the past, but none since utilizing biofeedback techniques.  PHYSICAL EXAMINATION  GENERAL:  Vivacious 77 year old female appearing slightly younger than stated age.  VITAL SIGNS:  Height 5 feet, weight 106 pounds, temperature 99, pulse 84, respirations 16, blood pressure 114/60.  HEENT:  Atraumatic.  Sclerae:  Non-icteric.  Some hearing loss noted.  Mucous membranes:  Moist.  Oropharynx without ulcers.  NECK:  No thyromegaly.  NODES:  No cervical, axillary, or inguinal adenopathy.  CHEST:  Few fine crackles left lower base.  HEART:  Grade 2/3 systolic murmur.  No gallop.  Regular rate and rhythm.  ABDOMEN:  Large abdominal dressing intact, essentially flat abdomen.  Positive bowel sounds.  Positive tenderness to palpation.  EXTREMITIES:  Without cyanosis, clubbing, or edema.  Strength 5/5.  NEUROLOGIC:  Cranial nerves 2-12 intact.  DTRs right lower extremity 1+, left lower extremity and upper extremities 2+.  Alert and oriented x 3.  Good historian.  LABORATORIES:  Hemoglobin 10.7.  Creatinine 8, BUN 14, glucose 100.  PATHOLOGY:  Pending.  FILMS:  April 24:  Abdomen and pelvic with contrast reveal no acute disease. April 22:  Chest negative for acute disease.  ASSESSMENT:  An 77 year old petite white female status post colectomy and pathology report positive for invasive moderately differentiated adenocarcinoma with mucinous features.  Tumor invades muscularis proprium. One of 16 lymph nodes positive.  Report just obtained.   PLAN:  Per Dr. Lyndal Pulley with a 30 minute discussion with patient and son. She is a marginal chemotherapy candidate for 5FU/leucovorin.  We will see if she gets stronger and gains a little weight over the next two to three weeks as she appears somewhat frail at present before we decide to  institute chemotherapy.  Dr. Catha Gosselin will meet with the husband Monday at 6 p.m. DD:  10/20/00 TD:  10/20/00 Job: [id number]

## 2010-11-02 NOTE — Assessment & Plan Note (Signed)
[facility]                                 ON-CALL NOTE   NAME:[name] Small, [name]                    MRN:          [id number]  DATE:08/21/2006                            DOB:          1916/07/14    CALLER:  Talbert Forest from [facility].   PHONE NUMBER:  [phone]   PATIENT'S PHYSICIAN:  Rosalyn Gess. Norins, M.D.   [facility] calling because the patient had a syncopal episode.  She  says she was in the dining room and slumped to the floor.  She seems to  be okay now.  They want to put her in observation.  I explained to the  nurse we do not know what caused the spell and I would advise her to  take her to [facility] emergency room for evaluation STAT.  The nurse said  they just want to keep her and watch her overnight and would call Dr.  Debby Bud tomorrow.  I explained I did not think that a good idea since we  did not know what caused her spell.  The nurse concurred; she will call  EMS.     Jeffrey A. Tawanna Cooler, MD  Electronically Signed    JAT/MedQ  DD: 08/22/2006  DT: 08/22/2006  Job #: [id number]

## 2010-11-09 ENCOUNTER — Ambulatory Visit: Payer: Medicare Other | Admitting: Obstetrics and Gynecology

## 2010-11-13 ENCOUNTER — Ambulatory Visit (INDEPENDENT_AMBULATORY_CARE_PROVIDER_SITE_OTHER): Payer: Medicare Other | Admitting: Obstetrics and Gynecology

## 2010-11-13 DIAGNOSIS — N8110 Cystocele, unspecified: Secondary | ICD-10-CM

## 2010-11-13 DIAGNOSIS — N951 Menopausal and female climacteric states: Secondary | ICD-10-CM

## 2010-11-13 DIAGNOSIS — B373 Candidiasis of vulva and vagina: Secondary | ICD-10-CM

## 2010-11-13 DIAGNOSIS — N952 Postmenopausal atrophic vaginitis: Secondary | ICD-10-CM

## 2010-11-20 ENCOUNTER — Other Ambulatory Visit: Payer: Medicare Other

## 2010-11-26 ENCOUNTER — Ambulatory Visit (INDEPENDENT_AMBULATORY_CARE_PROVIDER_SITE_OTHER): Payer: Medicare Other | Admitting: Internal Medicine

## 2010-11-26 ENCOUNTER — Encounter: Payer: Self-pay | Admitting: Internal Medicine

## 2010-11-26 DIAGNOSIS — M948X9 Other specified disorders of cartilage, unspecified sites: Secondary | ICD-10-CM

## 2010-11-26 DIAGNOSIS — M89319 Hypertrophy of bone, unspecified shoulder: Secondary | ICD-10-CM

## 2010-11-26 DIAGNOSIS — Z7189 Other specified counseling: Secondary | ICD-10-CM

## 2010-11-27 ENCOUNTER — Encounter: Payer: Self-pay | Admitting: Internal Medicine

## 2010-11-27 NOTE — Progress Notes (Signed)
Subjective:    Patient ID: [name] Small, female    DOB: [birth year], 77 y.o.   MRN: [id number]  HPI [name] Small presents today for an enlargement of the joint between the right clavicle and manubrium. She has no pain or discomfort from this, no limitation in movement of the right shoulder. This enlargement was noticed by the nurse at [facility] who suggested she have an evaluation.  She reports that she has a novaring in place and that she has felt better although she still has a large cystocele. She is current with Drs. Gottsegen and Tannenbaum.  The nursing staff at [facility] has suggested she talk with her doctor about DNR/out of facility order.   Past Medical History  Diagnosis Date  . Prolapsed urethral mucosa   . Osteoarthrosis, unspecified whether generalized or localized, pelvic region and thigh   . Full incontinence of feces   . Asymptomatic varicose veins   . Anemia, unspecified   . Idiopathic osteoporosis   . Melanoma of skin, site unspecified   . Gastroenteritis   . Iron deficiency anemia, unspecified   . Irritable bowel syndrome   . Internal hemorrhoids without mention of complication   . Carotid bruit     hx of  . Unspecified hereditary and idiopathic peripheral neuropathy   . Unspecified essential hypertension   . Esophageal reflux   . Diverticulosis   . Other and unspecified hyperlipidemia   . Spinal stenosis, unspecified region other than cervical   . Hypothyroidism   . Colon adenocarcinoma     hx of  . Fecal incontinence   . Melanoma   . MVP (mitral valve prolapse)   . Female bladder prolapse, acquired 07/17/2010   Past Surgical History  Procedure Date  . Hemicolectomy     right  . Colonoscopy w/ polypectomy 2006    3 mm polyp destroyed, diverticulosis  . Esophagogastroduodenoscopy 2009    mild gastritis   Family History  Problem Relation Age of Onset  . Stomach cancer Maternal Grandfather    History   Social History  . Marital  Status: Widowed    Spouse Name: N/A    Number of Children: 2  . Years of Education: N/A   Occupational History  . retired    Social History Main Topics  . Smoking status: Never Smoker   . Smokeless tobacco: Never Used  . Alcohol Use: Yes     occas wine 2 glasses a week  . Drug Use: No  . Sexually Active: No   Other Topics Concern  . Not on file   Social History Narrative   widowed after a very long and happy marriage almost 60 years. 2 sons - one a Nurse, learning disability, several grandchildren. Close and attentive family. Lives alone at [facility]: paricipates in yoga and exercise. I-ADLs including driving. Supportive family who checks on her and visits her regularly.End of Life Care: She clearly states she does not want CPR (out of facility order signed November 26, 2010). Reviewed with her MOST - she would not want prolonged intensive care, mechanical ventilation or prolonged artificial fluids or nutrition. Provided unsigned MOST form for her to review.        Review of Systems Review of Systems  Constitutional:  Negative for fever, chills, activity change and unexpected weight change.  HEENT:  Negative for hearing loss, ear pain, congestion, neck stiffness and postnasal drip. Negative for sore throat or swallowing problems. Negative for dental complaints.   Eyes: Negative for  vision loss or change in visual acuity.  Respiratory: Negative for chest tightness and wheezing.   Cardiovascular: Negative for chest pain and palpitation. No decreased exercise tolerance Gastrointestinal: No change in bowel habit. No bloating or gas. No reflux or indigestion Genitourinary: Negative for urgency, frequency, flank pain and difficulty urinating.  Musculoskeletal: Negative for myalgias, back pain, arthralgias and gait problem.  Neurological: Negative for dizziness, tremors, weakness and headaches.  Hematological: Negative for adenopathy.  Psychiatric/Behavioral: Negative for behavioral problems  and dysphoric mood.       Objective:   Physical Exam Vitals reviewed and stable Gen'l - a vivacious elderly woman who is in no distress. HEENT - dry mucus membranes, C&S clear Pul - normal respirations Cor - RRR MSK - bony enlargement of the clavicular-manubrium joint right with no tenderness.       Assessment & Plan:  1. MSK - reassured her that the protruberance at the C-M joint is benign.  2. End of Life issues - discussed CPR and MOST.  Plan - provided signed out of facility order           Provided unsigned MOST form for her review and discussion with loved ones

## 2010-12-10 ENCOUNTER — Telehealth: Payer: Self-pay | Admitting: Internal Medicine

## 2010-12-10 NOTE — Telephone Encounter (Signed)
Patient had a large BM this weekend with some rectal bleeding.  I have advised her to resume her proctotsol cream and suppositories she has at home.  The bleeding has stopped.  She would like to come in and see Dr Leone Payor and I have scheduled her an appt for 12/17/10 for when she returns from her trip out of town.

## 2010-12-10 NOTE — Telephone Encounter (Signed)
No answer and machine. I will continue to try and reach him

## 2010-12-13 ENCOUNTER — Telehealth: Payer: Self-pay | Admitting: Internal Medicine

## 2010-12-13 MED ORDER — HYDROCORTISONE ACETATE 25 MG RE SUPP: 25.0000 mg | Freq: Two times a day (BID) | RECTAL | Status: AC

## 2010-12-13 NOTE — Telephone Encounter (Signed)
Patient is advised that I will call in a refill of her suppositories until her appt next week.  She is having some rectal bleeding still.

## 2010-12-17 ENCOUNTER — Ambulatory Visit (INDEPENDENT_AMBULATORY_CARE_PROVIDER_SITE_OTHER): Payer: Medicare Other | Admitting: Internal Medicine

## 2010-12-17 ENCOUNTER — Encounter: Payer: Self-pay | Admitting: Internal Medicine

## 2010-12-17 DIAGNOSIS — K589 Irritable bowel syndrome without diarrhea: Secondary | ICD-10-CM

## 2010-12-17 DIAGNOSIS — K648 Other hemorrhoids: Secondary | ICD-10-CM

## 2010-12-17 NOTE — Assessment & Plan Note (Signed)
This is overall stable, she will continue a line and her fiber supplements she wants to use yogurt and that's okay also. She continues to self manage this reasonably well.

## 2010-12-17 NOTE — Assessment & Plan Note (Addendum)
I will discuss this with her nephew at her request. I think at this point perhaps sclerosis or in office banding would be useful. We discussed this today he'll make the appropriate referral pending discussion with her nephew.  I DID SPEAK TO DR. Brynda Peon AND HE CLEARLY INDICATED THE FAMILY HAD NOMINATED HIM TO SPEAK TO ME AND THAT ONLY CONSERVATIVE MEASURES SHOULD BE UNDERTAKEN AND THAT THEY WOULD PREFER AVOIDING ANY INVASIVE TESTING OR PROCEDURES/SURGERY. I BLEIEVE THAT SHE INTENDS TO FOLLOW THERE RECOMMENDATIONS AND WILL CONTINUE WITH CONSERVATIVE THERAPIES.

## 2010-12-17 NOTE — Patient Instructions (Addendum)
Please continue your current medications. We will contact you about an appointment with a surgeon after Dr. Leone Payor speaks with your Nephew.

## 2010-12-17 NOTE — Progress Notes (Signed)
  Subjective:    Patient ID: [name] Small, female    DOB: Sep 02, 1916, 77 y.o.   MRN: [id number]  HPI 77 year old white woman with chronic recurrent rectal bleeding, fecal incontinence and IBS with irregular bowel habits. She returns with repeated rectal bleeding, sometimes with wiping in stools as well as spontaneously into her underpants. This is not really a new pattern. She did manage to go to her grandsons living in [place] over the weekend and is very happy she was able to do so. She responds to topical steroids or suppositories but the chronicity of the problem is getting to her and it still scares her to her history of colon cancer. She would like me to speak to her nephew, a gastroenterologist in [place], regarding this as well. She gave me his phone number.   Review of Systems     Objective:   Physical Exam Well-developed elderly white woman in no acute distress, very spry. With female staff present inspection of the anorectum shows obvious hemorrhoids, she has a lax sphincter, the hemorrhoids are just mildly prolapsed and easily reducible. There is no rectal mass. On digital exam I can palpate her pessary through the rectal wall, on the other side.       Assessment & Plan:

## 2010-12-26 ENCOUNTER — Telehealth: Payer: Self-pay

## 2010-12-26 NOTE — Telephone Encounter (Signed)
Patient notified  All questions answered 

## 2010-12-26 NOTE — Telephone Encounter (Signed)
Message copied by Annett Fabian on Wed Dec 26, 2010  9:00 AM ------      Message from: Iva Boop      Created: Tue Dec 25, 2010  7:51 PM      Regarding: CALL HER       Please call her and let her know that I spoke to her gastroenterologist nephew and after that think we should not pursue any surgical procedures. Will continue with medication for hemorrhoids and IBS.

## 2011-01-09 ENCOUNTER — Ambulatory Visit (INDEPENDENT_AMBULATORY_CARE_PROVIDER_SITE_OTHER): Payer: Medicare Other | Admitting: Obstetrics and Gynecology

## 2011-01-09 ENCOUNTER — Encounter: Payer: Self-pay | Admitting: Obstetrics and Gynecology

## 2011-01-09 ENCOUNTER — Encounter: Payer: Self-pay | Admitting: Cardiology

## 2011-01-09 DIAGNOSIS — N950 Postmenopausal bleeding: Secondary | ICD-10-CM

## 2011-01-09 DIAGNOSIS — IMO0002 Reserved for concepts with insufficient information to code with codable children: Secondary | ICD-10-CM

## 2011-01-09 DIAGNOSIS — N8111 Cystocele, midline: Secondary | ICD-10-CM

## 2011-01-09 DIAGNOSIS — K625 Hemorrhage of anus and rectum: Secondary | ICD-10-CM

## 2011-01-09 DIAGNOSIS — N952 Postmenopausal atrophic vaginitis: Secondary | ICD-10-CM

## 2011-01-09 MED ORDER — ESTRADIOL 2 MG VA RING: 2.0000 mg | VAGINAL_RING | VAGINAL | Status: DC

## 2011-01-09 NOTE — Progress Notes (Signed)
The patient came in today with a history of seeing vaginal bleeding yesterday. It was related to exercising. She is also seen some rectal bleeding 2 weeks ago. She had reported that to her GI doctor. She said at one point her vaginal ring slip but she pushed it back up. She is not bleeding at all today. Ultrasound with SI H6 months ago was normal.  Physical exam: Abdomen is soft without guarding rebound or masses. External within normal limits. BUN is within normal limits. Bladder reveals second-degree cystocele. Vaginal examination shows excellent estrogen effect. Ring is in place. Cervix is clean. Uterus is normal size and shape. Adnexa failed to reveal masses. Rectovaginal is confirmatory without masses.  Assessment 1 vaginal bleeding 2. Rectal bleeding3. Atrophic vaginitis4. Cystocele  Plan ring was removed and there was no erosion. There was no obvious cause for the bleeding. I have reassured the patient. For the moment we will just observe. If it persists or recurs we will do an ultrasound.

## 2011-01-09 NOTE — Progress Notes (Signed)
Addended by: Richardson Chiquito on: 01/09/2011 05:55 PM   Modules accepted: Orders

## 2011-01-15 ENCOUNTER — Other Ambulatory Visit: Payer: Self-pay

## 2011-01-15 DIAGNOSIS — N939 Abnormal uterine and vaginal bleeding, unspecified: Secondary | ICD-10-CM

## 2011-01-15 NOTE — Progress Notes (Signed)
PT STATES VAGINAL BLEEDING STARTED AGAIN TODAY AFTER EXERCISE. PER DR. GOTTSEGENS 01/09/11 OV NOTE TRANSFERRED PTS CALL TO [facility]PT[facility] TO SET UP ULTRASOUND. PER [facility] ULTRASOUND ORDER WILL BE PUT IN THE DAY PT ARRIVES FOR APPT. ALL CHECK-IN GIRLS NOTIFIED OF THIS ALSO.

## 2011-01-29 ENCOUNTER — Ambulatory Visit: Payer: Medicare Other | Admitting: Obstetrics and Gynecology

## 2011-01-29 ENCOUNTER — Ambulatory Visit (INDEPENDENT_AMBULATORY_CARE_PROVIDER_SITE_OTHER): Payer: Medicare Other | Admitting: Obstetrics and Gynecology

## 2011-01-29 ENCOUNTER — Other Ambulatory Visit: Payer: Medicare Other

## 2011-01-29 DIAGNOSIS — N950 Postmenopausal bleeding: Secondary | ICD-10-CM

## 2011-01-29 DIAGNOSIS — N952 Postmenopausal atrophic vaginitis: Secondary | ICD-10-CM

## 2011-01-29 DIAGNOSIS — N898 Other specified noninflammatory disorders of vagina: Secondary | ICD-10-CM

## 2011-01-29 DIAGNOSIS — B373 Candidiasis of vulva and vagina: Secondary | ICD-10-CM

## 2011-01-29 DIAGNOSIS — L293 Anogenital pruritus, unspecified: Secondary | ICD-10-CM

## 2011-01-29 NOTE — Progress Notes (Signed)
The patient came to see me today with a history of 2 episodes of vaginal bleeding. They occurred one week apart in mid July. She is seeing none since then. She is also having vulvar and vaginal itching today.  Abdomen is soft without guarding rebound or masses. External genitalia within normal limits. BUS within normal limits. Vaginal examination shows heavy white discharge. Wet prep is positive for yeast. Cervix is clean. Uterus is normal size and shape. Adnexa fails to reveal masses.  Assessment: 1. Postmenopausal bleeding 2. Yeast vaginitis.  Plan: 1. Because of the bleeding an ultrasound was done. Uterus is normal except for small calcification. Endometrial echo is thin at 3.5 mm. Right ovary is normal except for small calcification of 8 x 5 mm. Left ovary was completely normal. Cul-de-sac is free of fluid. Patient was reassured and she will let me know she has further bleeding. 2. Patient treated with Diflucan 150 mg daily for 3 days for yeast vaginitis. 3. It was time to change her vaginal raining so her old Estring was removed and a new one was placed.

## 2011-02-05 ENCOUNTER — Encounter: Payer: Self-pay | Admitting: Cardiology

## 2011-02-05 DIAGNOSIS — R0989 Other specified symptoms and signs involving the circulatory and respiratory systems: Secondary | ICD-10-CM | POA: Insufficient documentation

## 2011-02-05 DIAGNOSIS — I10 Essential (primary) hypertension: Secondary | ICD-10-CM | POA: Insufficient documentation

## 2011-02-08 ENCOUNTER — Encounter: Payer: Self-pay | Admitting: Cardiology

## 2011-02-08 ENCOUNTER — Ambulatory Visit (INDEPENDENT_AMBULATORY_CARE_PROVIDER_SITE_OTHER): Payer: Medicare Other | Admitting: Cardiology

## 2011-02-08 VITALS — BP 114/72 | HR 76 | Ht 59.0 in | Wt 101.0 lb

## 2011-02-08 DIAGNOSIS — I10 Essential (primary) hypertension: Secondary | ICD-10-CM

## 2011-02-08 NOTE — Progress Notes (Signed)
HPI Patient is seen for followup of hypertension and overall cardiovascular care.  She is 45 and doing well.  She's not having any significant symptoms.  She has some other problems with uterine prolapse and bladder prolapse.  These are being managed and she stable. Allergies  Allergen Reactions  . Crab (Shellfish Allergy)     sensitivity  . Demerol   . Meperidine Hcl Other (See Comments)    Drop in blood pressure    Current Outpatient Prescriptions  Medication Sig Dispense Refill  . Alpha-D-Galactosidase (BEANO PO) Take 2 tablets by mouth every morning before fiber intake and every evening      . ALPRAZolam (XANAX) 0.25 MG tablet Take 1 tablet (0.25 mg total) by mouth every 6 (six) hours as needed for anxiety.  100 tablet  5  . aspirin 81 MG tablet Take 81 mg by mouth daily.        . calcium carbonate (OS-CAL) 600 MG TABS Take 600 mg by mouth daily.        . calcium carbonate (TUMS) 500 MG chewable tablet Chew 1 tablet by mouth as needed.       . cholecalciferol (VITAMIN D-400) 400 UNITS TABS Take 400 Units by mouth daily.        Marland Kitchen estradiol (ESTRING) 2 MG vaginal ring Place 2 mg vaginally every 3 (three) months. follow package directions  1 each  4  . fluticasone (FLONASE) 50 MCG/ACT nasal spray 2 sprays by Nasal route as needed.        . gabapentin (NEURONTIN) 600 MG tablet Take 600 mg by mouth at bedtime.        . hydrocortisone (PROCTOSOL HC) 2.5 % rectal cream Place 1 application rectally 2 (two) times daily as needed.        . Lactobacillus (REPHRESH PRO-B) CAPS Take 1 by mouth every day       . levothyroxine (SYNTHROID, LEVOTHROID) 75 MCG tablet Take 75 mcg by mouth daily.        . methylcellulose (CITRUCEL FIBER LAXATIVE) packet Take 1 tbsp by mouth every morning      . Multiple Vitamin (MULTIVITAMIN) tablet Take 1 tablet by mouth daily.        Marland Kitchen nystatin-triamcinolone (MYCOLOG II) cream Apply 1 application topically. As directed as needed       . omeprazole (PRILOSEC) 20 MG  capsule Take 20 mg by mouth daily.        . polyethylene glycol powder (MIRALAX) powder Take 17 g by mouth daily.        . Probiotic Product (ALIGN) 4 MG CAPS Take 1 tablet by mouth daily.        . Ranitidine HCl (ZANTAC PO) 25mg /ml soln as needed       . zolpidem (AMBIEN) 5 MG tablet Take 5 mg by mouth at bedtime as needed.          History   Social History  . Marital Status: Widowed    Spouse Name: N/A    Number of Children: 2  . Years of Education: N/A   Occupational History  . retired    Social History Main Topics  . Smoking status: Never Smoker   . Smokeless tobacco: Never Used  . Alcohol Use: 0.5 oz/week    1 drink(s) per week     occas wine 2 glasses a week  . Drug Use: No  . Sexually Active: No   Other Topics Concern  . Not on file  Social History Narrative   widowed after a very long and happy marriage almost 60 years. 2 sons - one a Nurse, learning disability, several grandchildren. Close and attentive family. Lives alone at [place]: paricipates in yoga and exercise. I-ADLs including driving. Supportive family who checks on her and visits her regularly.End of Life Care: She clearly states she does not want CPR (out of facility order signed November 26, 2010). Reviewed with her MOST - she would not want prolonged intensive care, mechanical ventilation or prolonged artificial fluids or nutrition. Provided unsigned MOST form for her to review.     Family History  Problem Relation Age of Onset  . Stomach cancer Maternal Grandfather   . Cancer Maternal Grandfather     stomach  . Colon cancer Neg Hx   . Heart disease Father   . Diabetes Father     Past Medical History  Diagnosis Date  . Prolapsed urethral mucosa   . Osteoarthrosis, unspecified whether generalized or localized, pelvic region and thigh   . Full incontinence of feces   . Asymptomatic varicose veins   . Anemia, unspecified   . Idiopathic osteoporosis   . Melanoma of skin, site unspecified   .  Gastroenteritis   . Iron deficiency anemia, unspecified   . Irritable bowel syndrome   . Internal hemorrhoids without mention of complication   . Carotid bruit     hx of  . Neuropathy     peripheral  . HTN (hypertension)   . Esophageal reflux   . Diverticulosis   . Dyslipidemia   . Spinal stenosis, unspecified region other than cervical   . Hypothyroidism   . Colon adenocarcinoma     hx of  . Fecal incontinence   . Melanoma   . MVP (mitral valve prolapse)   . Female bladder prolapse, acquired 07/17/2010  . Vaginal bleeding, abnormal   . Varicose veins   . Idiopathic osteoporosis   . Vaginitis, atrophic   . Adenocarcinoma of colon   . Hx of colonoscopy   . Fibroid   . Carotid bruit     doppler normal in the past    Past Surgical History  Procedure Date  . Hemicolectomy     right  . Colonoscopy w/ polypectomy 2006    3 mm polyp destroyed, diverticulosis  . Esophagogastroduodenoscopy 2009    mild gastritis  . Hemorrhoid surgery   . Hysteroscopy   . Dilation and curettage of uterus   . Melanoma excision     ROS  Patient denies fever, chills, headache, sweats, rash, change in vision, change in hearing, chest pain, cough, nausea vomiting.  All other systems are reviewed and are negative other than the history of present illness.  PHYSICAL EXAM Patient is oriented to person time and place.  Affect is normal.  She is stable.  Head is atraumatic.  Lungs are clear.  Respiratory effort is nonlabored.  Cardiac exam reveals a fullness to further clicks or significant murmurs is soft.  There is no peripheral edema. Filed Vitals:   02/08/11 1129  BP: 114/72  Pulse: 76  Height: 4\' 11"  (1.499 m)  Weight: 101 lb (45.813 kg)    EKG Is done today and reviewed by me.  She is normal sinus rhythm.  There are no significant changes.  ASSESSMENT & PLAN

## 2011-02-08 NOTE — Assessment & Plan Note (Signed)
Blood pressure is stable.  No change in therapy.  Overall her cardiovascular status is stable.  As part of today's record I have reviewed my old notes and updated the current chronic record.

## 2011-02-08 NOTE — Patient Instructions (Signed)
Your physician recommends that you schedule a follow-up appointment in: 12 months.  

## 2011-02-11 ENCOUNTER — Telehealth: Payer: Self-pay | Admitting: Internal Medicine

## 2011-02-11 NOTE — Telephone Encounter (Signed)
Patient advised she is asked to call back tomorrow with an update

## 2011-02-11 NOTE — Telephone Encounter (Signed)
Patient has had a large amount of bright red bleeding this am.  She has done well up to today.  She states it was a very large amount of bleeding initially but seems to have stopped after inserting a suppository.  She is asked to continue her sitz bath and BID suppositories until Dr Leone Payor can review and advise.  Dr Leone Payor please advise

## 2011-02-11 NOTE — Telephone Encounter (Signed)
Please ask her to call us with an update tomorrow or you call her to see what is happening but sounds like her hemorrhoids are bleeding again.

## 2011-02-12 NOTE — Telephone Encounter (Signed)
Patient advised.

## 2011-02-12 NOTE — Telephone Encounter (Signed)
No bleeding this am.  She is asking to be seen today because she is "so tired".  You do have an opening today at 3:00.  Do you want to see her?

## 2011-02-12 NOTE — Telephone Encounter (Signed)
She should see PCP for fatigue

## 2011-02-14 ENCOUNTER — Ambulatory Visit (INDEPENDENT_AMBULATORY_CARE_PROVIDER_SITE_OTHER): Payer: Medicare Other | Admitting: Internal Medicine

## 2011-02-14 ENCOUNTER — Ambulatory Visit: Payer: Medicare Other

## 2011-02-14 VITALS — BP 146/82 | HR 78 | Temp 98.2°F | Wt 100.0 lb

## 2011-02-14 DIAGNOSIS — Z8719 Personal history of other diseases of the digestive system: Secondary | ICD-10-CM

## 2011-02-14 DIAGNOSIS — K921 Melena: Secondary | ICD-10-CM

## 2011-02-14 DIAGNOSIS — K5732 Diverticulitis of large intestine without perforation or abscess without bleeding: Secondary | ICD-10-CM

## 2011-02-14 LAB — VITAMIN B12: Vitamin B-12: >1500 pg/mL — ABNORMAL HIGH (ref 211–911)

## 2011-02-14 LAB — HEMOGLOBIN: Hemoglobin: 12.3 g/dL (ref 12.0–15.0)

## 2011-02-14 LAB — HEMATOCRIT: HCT: 37.3 % (ref 36.0–46.0)

## 2011-02-14 MED ORDER — HYDROCORTISONE ACETATE 25 MG RE SUPP: 25.0000 mg | Freq: Two times a day (BID) | RECTAL | Status: AC

## 2011-02-14 NOTE — Patient Instructions (Signed)
The large amount of blood per rectum that was painless and not associated with a bowel movement was most likely a diverticular bleed. You have diverticulosis by colonoscopy '06. These are usually self-limiting. There is nothing you do that causes the bleed and there is not much you can do to prevent bleeding. There is no way to predict if or when you will have another episode.  Hemorrhoids are a different type of bleed and more controllable: no straining, use of suppositories.

## 2011-02-14 NOTE — Progress Notes (Signed)
  Subjective:    Patient ID: [name] Small, female    DOB: 10-18-1916, 77 y.o.   MRN: [id number]  HPI [name] Small presents with the report of hematochezia Monday August 27th - spontaneous, painless bleeding of a large amount of blood. Also is having chronic hemorrhoids. She has been followed by Drs [name] Small and [name] Small for vaginal bleeding which is quiescent. She has had no chest pain, no shortness of breath or any other sign of significant blood loss.   I have reviewed the patient's medical history in detail and updated the computerized patient record.    Review of Systems System review is negative for any constitutional, cardiac, pulmonary or neuro symptoms or complaints     Objective:   Physical Exam Vitals reviewed and normal Gen'l - a very thin, elderly but well preserved white woman in no distress. HEENT - C&S clear Respirations - normal Cor - 2+ radial pulse, RRR       Assessment & Plan:

## 2011-02-17 NOTE — Assessment & Plan Note (Signed)
Patient with known diverticulosis. Her history of spontaneous hematochezia not associated with bowel movement that was painless is c/w diverticular bleed. This stopped spontaneously without recurrence. She has been asymptomatic.  Component     Latest Ref Rng 02/14/2011  Vitamin B-12     211 - 911 pg/mL >1500 (H)  HGB     12.0 - 15.0 g/dL 09.6  HCT     04.5 - 40.9 % 37.3   Plan - patient reassured and educated about diverticular bleeds - chpt from Up[employer] provided

## 2011-02-18 ENCOUNTER — Telehealth: Payer: Self-pay | Admitting: Internal Medicine

## 2011-02-18 NOTE — Telephone Encounter (Signed)
Blood coutns and B12 are normal

## 2011-02-19 NOTE — Telephone Encounter (Signed)
Informed pt .

## 2011-03-06 ENCOUNTER — Encounter: Payer: Self-pay | Admitting: Obstetrics and Gynecology

## 2011-03-15 ENCOUNTER — Telehealth: Payer: Self-pay | Admitting: *Deleted

## 2011-03-15 NOTE — Telephone Encounter (Signed)
Pt called thinks Estring has fallen out. She is symptomatic. Wants an OV for Dr Reece Agar to check. Pt will bring another Estring just in case. Pt transferred to apts.

## 2011-03-19 ENCOUNTER — Ambulatory Visit: Payer: Medicare Other | Admitting: Obstetrics and Gynecology

## 2011-03-21 ENCOUNTER — Ambulatory Visit: Payer: Medicare Other | Admitting: Obstetrics and Gynecology

## 2011-03-21 ENCOUNTER — Encounter: Payer: Self-pay | Admitting: Obstetrics and Gynecology

## 2011-03-21 ENCOUNTER — Ambulatory Visit (INDEPENDENT_AMBULATORY_CARE_PROVIDER_SITE_OTHER): Payer: Medicare Other | Admitting: Obstetrics and Gynecology

## 2011-03-21 DIAGNOSIS — N898 Other specified noninflammatory disorders of vagina: Secondary | ICD-10-CM

## 2011-03-21 DIAGNOSIS — B3731 Acute candidiasis of vulva and vagina: Secondary | ICD-10-CM

## 2011-03-21 DIAGNOSIS — B373 Candidiasis of vulva and vagina: Secondary | ICD-10-CM

## 2011-03-21 DIAGNOSIS — N952 Postmenopausal atrophic vaginitis: Secondary | ICD-10-CM

## 2011-03-21 MED ORDER — FLUCONAZOLE 150 MG PO TABS: 150.0000 mg | ORAL_TABLET | Freq: Once | ORAL | Status: AC

## 2011-03-21 NOTE — Progress Notes (Addendum)
Patient came in today with the problems. The first is that she's having some vaginal discharge with a little bit of irritation. She's had no vaginal bleeding. She is having no pelvic pain. She is having no dysuria, frequency, or urgency. She also thinks she passed her Estring. She's using a for atrophic vaginitis. I changed in August 14. She had a stomach virus in September and she wonders if it didn't happen then.  External genitalia: Within normal limits. BUS: Within normal limits. Vaginal examination: Heavy discharge with wet prep positive for yeast. Patient has almost a second-degree cystocele. Cervix clean. Uterus normal size and shape. Adnexa failed to reveal masses. Careful speculum and digital exams failed to reveal retained ring.  Assessment: #1. Atrophic vaginitis #2. Yeast vaginitis  Plan: Diflucan 150 mg daily for 3 days. New Estring inserted.  Patient came back 6 hours after we have seen her today saying that she thought her ring had slipped so she took it out. I reinserted the ring. I think because her cystocele has gotten bigger, she may trouble keeping it. If she cannot keep it in I suggested she start Vagifem and she was given samples of the 10 MCG tablet and she will use it vaginally BIW.

## 2011-04-12 ENCOUNTER — Other Ambulatory Visit: Payer: Self-pay | Admitting: Internal Medicine

## 2011-04-17 ENCOUNTER — Telehealth: Payer: Self-pay | Admitting: *Deleted

## 2011-04-17 DIAGNOSIS — B373 Candidiasis of vulva and vagina: Secondary | ICD-10-CM

## 2011-04-17 NOTE — Telephone Encounter (Signed)
Pt called stating that her yeast infection still there, itching, white discharge with irritation. She took the diflucan as directed and took the refill and no relief. Last office 03/21/11 with same complain  Please advise

## 2011-04-18 MED ORDER — TERCONAZOLE 0.8 % VA CREA: TOPICAL_CREAM | VAGINAL | Status: DC

## 2011-04-18 NOTE — Telephone Encounter (Signed)
Have pt try Terconazole-3. Use one applicatorful in vagina for three nights and apply outside b.i.d. For external itching.

## 2011-04-18 NOTE — Telephone Encounter (Signed)
rx sent to pharmacy

## 2011-04-18 NOTE — Telephone Encounter (Signed)
Pt informed with the below note. 

## 2011-04-23 ENCOUNTER — Telehealth: Payer: Self-pay | Admitting: *Deleted

## 2011-04-23 NOTE — Telephone Encounter (Signed)
Patient states that she has seen 5-6 drops of blood on floor when using the bathroom, believes to be bleeding hemorrhoids. Would like to know if she should make OV w/you or with Dr. Leone Payor. Request Rx for Proctosol 2.5% cream , stating it has been useful in past w/hemorrhoids.

## 2011-04-23 NOTE — Telephone Encounter (Signed)
1.ok for proctosol 2.5% cream 2. Either me or Leone Payor - her choice.

## 2011-04-24 ENCOUNTER — Telehealth: Payer: Self-pay | Admitting: *Deleted

## 2011-04-24 DIAGNOSIS — B373 Candidiasis of vulva and vagina: Secondary | ICD-10-CM

## 2011-04-24 MED ORDER — HYDROCORTISONE 2.5 % RE CREA: 1.0000 "application " | TOPICAL_CREAM | Freq: Two times a day (BID) | RECTAL | Status: DC | PRN

## 2011-04-24 MED ORDER — TERCONAZOLE 0.8 % VA CREA: TOPICAL_CREAM | VAGINAL | Status: DC

## 2011-04-24 NOTE — Telephone Encounter (Signed)
Lm for patient to call

## 2011-04-24 NOTE — Telephone Encounter (Signed)
Patient c/o still having vaginal discharge and noticed very water white liquid.  She was wondering if there was an issue with her Estring.  She thinks she may need to come see you but she wanted to know what you thought first.  Please advise.

## 2011-04-24 NOTE — Telephone Encounter (Signed)
I think the watery white liquid is just lubrication from the ring. It is a sign that the ring is working. I don't think she has an infection unless she's having itching.

## 2011-04-24 NOTE — Telephone Encounter (Signed)
Refill now and have her use it for 4 more days.

## 2011-04-24 NOTE — Telephone Encounter (Signed)
Patient says she is still having some vaginal itching.  She said also that she has wasted some of the Terazol cream while trying to insert.  She wonders if we could just refill for now and see how things go?

## 2011-04-24 NOTE — Telephone Encounter (Signed)
Pt advised about Rx and states she has appt with another MD regarding vaginal discharge and will follow up with MEN as needed.

## 2011-04-24 NOTE — Telephone Encounter (Signed)
Lm on vm telling patient refill called in.

## 2011-04-25 ENCOUNTER — Other Ambulatory Visit: Payer: Self-pay | Admitting: Internal Medicine

## 2011-04-25 ENCOUNTER — Other Ambulatory Visit: Payer: Self-pay | Admitting: *Deleted

## 2011-04-25 MED ORDER — HYDROCORTISONE 2.5 % RE CREA: 1.0000 "application " | TOPICAL_CREAM | Freq: Two times a day (BID) | RECTAL | Status: DC | PRN

## 2011-04-25 NOTE — Telephone Encounter (Signed)
Spoke to pharmacist and she states they have already received authorization for pt's anusol. No other outstanding refill requests.

## 2011-05-01 ENCOUNTER — Telehealth: Payer: Self-pay | Admitting: *Deleted

## 2011-05-01 NOTE — Telephone Encounter (Signed)
Refill request for alprazolam 0.25 QTY 100 last filled on 01/14/2011. Please Advise

## 2011-05-02 MED ORDER — ALPRAZOLAM 0.25 MG PO TABS: 0.2500 mg | ORAL_TABLET | Freq: Four times a day (QID) | ORAL | Status: DC | PRN

## 2011-05-02 NOTE — Telephone Encounter (Signed)
Ok for refill w/ 2 add'l

## 2011-05-28 NOTE — Progress Notes (Signed)
Patient ID: [name] Small, female   DOB: March 29, 1917, 77 y.o.   MRN: [id number] PT. WANTS TO BE CHECKED BEFORE January 2013 DUE TO ONE EPISODE OF VAGINAL BLEEDING 05-25-11 AND NOT SURE IF HER RING IS STILL WERE IT SHOULD BE. I TRANSFERRED HER TO APPTS TO SEE DR. Reece Agar NEXT WEEK.

## 2011-05-30 ENCOUNTER — Encounter: Payer: Self-pay | Admitting: Gynecology

## 2011-05-30 ENCOUNTER — Ambulatory Visit (INDEPENDENT_AMBULATORY_CARE_PROVIDER_SITE_OTHER): Payer: Medicare Other | Admitting: Gynecology

## 2011-05-30 DIAGNOSIS — B373 Candidiasis of vulva and vagina: Secondary | ICD-10-CM

## 2011-05-30 DIAGNOSIS — R300 Dysuria: Secondary | ICD-10-CM

## 2011-05-30 DIAGNOSIS — N390 Urinary tract infection, site not specified: Secondary | ICD-10-CM

## 2011-05-30 DIAGNOSIS — N898 Other specified noninflammatory disorders of vagina: Secondary | ICD-10-CM

## 2011-05-30 MED ORDER — CIPROFLOXACIN HCL 250 MG PO TABS: 250.0000 mg | ORAL_TABLET | Freq: Two times a day (BID) | ORAL | Status: AC

## 2011-05-30 MED ORDER — CIPROFLOXACIN HCL 250 MG PO TABS: 250.0000 mg | ORAL_TABLET | Freq: Two times a day (BID) | ORAL | Status: DC

## 2011-05-30 MED ORDER — FLUCONAZOLE 150 MG PO TABS: 150.0000 mg | ORAL_TABLET | Freq: Once | ORAL | Status: AC

## 2011-05-30 NOTE — Progress Notes (Signed)
Patient presents complaining of a persistent yeast infection seems most worse over the last 2 weeks. She did have some dark brown discharge last week for one episode but that disappeared and now just seems to be more white. She had been evaluated over the past year per Dr. Eda Paschal to include and endometrial assessment which was benign. She also noted some slight dysuria.  Exam with chaperone Abdomen soft nontender without masses guarding rebound organomegaly Pelvic external BUS vagina with atrophic changes abundant yellow vaginal discharge. Her Estring was removed. Bimanual without masses or tenderness  Assessment and plan: 1. Abundant vaginal discharge. Wet prep is positive for yeast. We'll treat with Diflucan 150 mg daily for 5 days then once weekly x2 months and hopefully this will suppress her. She asked me to discard her Estring she says she has an appointment with Dr. Eda Paschal  to have it replaced in 2 weeks and would prefer just to leave it out. 2. UTI. Her UA is suggestive of a UTI. I will treat with ciprofloxacin 250 mg twice a day x5 days.

## 2011-05-30 NOTE — Patient Instructions (Signed)
Take ciprofloxacin antibiotic for the urinary tract infection 2 times daily for 5 days. Take Diflucan medication for yeast infection daily once for 5 days and then take one tablet once a week for 2 months

## 2011-06-07 ENCOUNTER — Other Ambulatory Visit: Payer: Self-pay | Admitting: *Deleted

## 2011-06-07 MED ORDER — FLUTICASONE PROPIONATE 50 MCG/ACT NA SUSP: 2.0000 | NASAL | Status: DC | PRN

## 2011-06-08 ENCOUNTER — Telehealth: Payer: Self-pay | Admitting: Oncology

## 2011-06-08 NOTE — Telephone Encounter (Signed)
lmonvm adviisng the pt of her r/s appts from jan to feband march due to dr Diona Browner will be out of the office .

## 2011-06-12 ENCOUNTER — Ambulatory Visit (INDEPENDENT_AMBULATORY_CARE_PROVIDER_SITE_OTHER): Payer: Medicare Other | Admitting: Obstetrics and Gynecology

## 2011-06-12 ENCOUNTER — Encounter: Payer: Self-pay | Admitting: Obstetrics and Gynecology

## 2011-06-12 DIAGNOSIS — Z5189 Encounter for other specified aftercare: Secondary | ICD-10-CM

## 2011-06-12 DIAGNOSIS — B373 Candidiasis of vulva and vagina: Secondary | ICD-10-CM

## 2011-06-12 DIAGNOSIS — N760 Acute vaginitis: Secondary | ICD-10-CM

## 2011-06-12 DIAGNOSIS — R82998 Other abnormal findings in urine: Secondary | ICD-10-CM

## 2011-06-12 DIAGNOSIS — A499 Bacterial infection, unspecified: Secondary | ICD-10-CM

## 2011-06-12 DIAGNOSIS — N898 Other specified noninflammatory disorders of vagina: Secondary | ICD-10-CM

## 2011-06-12 DIAGNOSIS — N950 Postmenopausal bleeding: Secondary | ICD-10-CM

## 2011-06-12 DIAGNOSIS — R8271 Bacteriuria: Secondary | ICD-10-CM

## 2011-06-12 MED ORDER — METRONIDAZOLE 500 MG PO TABS: 500.0000 mg | ORAL_TABLET | Freq: Two times a day (BID) | ORAL | Status: AC

## 2011-06-12 NOTE — Patient Instructions (Signed)
Make appointment for pelvic ultrasound. Take Flagyl for one week. Continue Diflucan.

## 2011-06-12 NOTE — Progress Notes (Signed)
Patient came back to see me today. She brought a letter summarizing what has happened in November and December of 2012. We will scan this into her chart. Since she is saw Dr. Audie Box she has had several episodes of vaginal bleeding. She has also had some rectal bleeding from her hemorrhoids but she distinguishs the two events. She had had an endometrial biopsy done in January of 2012 which was benign. Her last ultrasound was in May of 2011 was normal. Dr. Audie Box treated her for both a yeast vaginitis and urinary tract infection. She is still having vaginal discharge and external itching. She thought we would put her vaginal ring back in place today. She is having no pelvic pain.  Physical exam: [name] Small present. External: Severe vulvitis. BUS: Within normal limits. Vaginal exam: Profuse yellow discharge. Cervix: Clean. Uterus: Normal size and shape. Adnexa: Within normal limits. Urinalysis: Grossly abnormal. Wet prep:positive for yeast, amine and clue cells.  Assessment: #1. Yeast vaginitis #2. Bacterial vaginosis #3. Postmenopausal bleeding #4. Atrophic vaginitis #5. Possible urinary tract infection #6. Hemorrhoids with bleeding.  Plan: Flagyl 500 mg twice a day for 7 days. For the moment continue Diflucan weekly. Continue rectal cream for hemorrhoids. She has discussed the rectal bleeding both her PCP and her GI doctor. Return in one week for pelvic ultrasound.

## 2011-06-21 ENCOUNTER — Ambulatory Visit: Payer: Medicare Other | Admitting: Obstetrics and Gynecology

## 2011-06-21 ENCOUNTER — Other Ambulatory Visit: Payer: Medicare Other

## 2011-06-25 ENCOUNTER — Other Ambulatory Visit: Payer: Medicare Other | Admitting: Lab

## 2011-06-26 ENCOUNTER — Ambulatory Visit (INDEPENDENT_AMBULATORY_CARE_PROVIDER_SITE_OTHER): Payer: Medicare Other | Admitting: Obstetrics and Gynecology

## 2011-06-26 ENCOUNTER — Ambulatory Visit (INDEPENDENT_AMBULATORY_CARE_PROVIDER_SITE_OTHER): Payer: Medicare Other

## 2011-06-26 DIAGNOSIS — N950 Postmenopausal bleeding: Secondary | ICD-10-CM

## 2011-06-26 DIAGNOSIS — IMO0002 Reserved for concepts with insufficient information to code with codable children: Secondary | ICD-10-CM

## 2011-06-26 DIAGNOSIS — B373 Candidiasis of vulva and vagina: Secondary | ICD-10-CM

## 2011-06-26 DIAGNOSIS — N8111 Cystocele, midline: Secondary | ICD-10-CM | POA: Diagnosis not present

## 2011-06-26 DIAGNOSIS — N83339 Acquired atrophy of ovary and fallopian tube, unspecified side: Secondary | ICD-10-CM

## 2011-06-26 NOTE — Progress Notes (Signed)
Patient came back today for ultrasound because of vaginal bleeding, and reinsertion of her vaginal ring. She continues on Diflucan weekly and is also doing dietary changes to try to change the pH of her vagina. Ahead and did an ultrasound today. Her uterus does show calcifications but no masses. Her endometrial echo is 2 mm with this sliver of fluid in the cavity. Her right ovary is normal except for continued presence of a calcification. Her left ovary could not be seen due to bowel shadowing but no mass was seen. Her cul-de-sac is free of fluid. I went ahead and put her Estring in today. She still has some vulvar irritation and slight vaginal discharge consistent with yeast vaginitis.  Assessment: #1. Postmenopausal bleeding #2. Yeast vaginitis  Plan: Patient reassured about ultrasound. I believe the bleeding was either due to the yeast or possibly related to the fluid in her endometrial cavity. I don't believe she needs another endometrial biopsy. She will report new bleeding to me. She will continue with her Estring. She will continue with weekly Diflucan. I told her I was fine with the dietary changes but thought she could do just as well using oral refresh 3 times a week.

## 2011-07-05 DIAGNOSIS — N8110 Cystocele, unspecified: Secondary | ICD-10-CM | POA: Diagnosis not present

## 2011-07-05 DIAGNOSIS — N368 Other specified disorders of urethra: Secondary | ICD-10-CM | POA: Diagnosis not present

## 2011-07-09 DIAGNOSIS — H903 Sensorineural hearing loss, bilateral: Secondary | ICD-10-CM | POA: Diagnosis not present

## 2011-07-10 ENCOUNTER — Telehealth: Payer: Self-pay | Admitting: *Deleted

## 2011-07-10 NOTE — Telephone Encounter (Signed)
Pt informed with the below note. 

## 2011-07-10 NOTE — Telephone Encounter (Signed)
Per Dr. Reece Agar her would not recommend of pessary placement.

## 2011-07-10 NOTE — Telephone Encounter (Signed)
Pt called wanting to know if you thought that a pessary would be a good option for her? Please advise

## 2011-07-10 NOTE — Telephone Encounter (Signed)
Pt said that you had mentioned to her once before and she said no at the time, but then she had a recent visit at Dr. Patsi Sears office and the nurse brought the thought to her attention.

## 2011-07-10 NOTE — Telephone Encounter (Signed)
Asked her what symptoms she wants to change that made her think of a pessary.

## 2011-07-16 ENCOUNTER — Encounter: Payer: Self-pay | Admitting: Internal Medicine

## 2011-07-16 ENCOUNTER — Telehealth: Payer: Self-pay | Admitting: Internal Medicine

## 2011-07-16 ENCOUNTER — Ambulatory Visit (INDEPENDENT_AMBULATORY_CARE_PROVIDER_SITE_OTHER): Payer: Medicare Other | Admitting: Internal Medicine

## 2011-07-16 ENCOUNTER — Other Ambulatory Visit (INDEPENDENT_AMBULATORY_CARE_PROVIDER_SITE_OTHER): Payer: Medicare Other

## 2011-07-16 DIAGNOSIS — M25519 Pain in unspecified shoulder: Secondary | ICD-10-CM

## 2011-07-16 DIAGNOSIS — N8110 Cystocele, unspecified: Secondary | ICD-10-CM

## 2011-07-16 DIAGNOSIS — E039 Hypothyroidism, unspecified: Secondary | ICD-10-CM

## 2011-07-16 DIAGNOSIS — D649 Anemia, unspecified: Secondary | ICD-10-CM

## 2011-07-16 DIAGNOSIS — E785 Hyperlipidemia, unspecified: Secondary | ICD-10-CM | POA: Diagnosis not present

## 2011-07-16 DIAGNOSIS — N8111 Cystocele, midline: Secondary | ICD-10-CM

## 2011-07-16 DIAGNOSIS — K648 Other hemorrhoids: Secondary | ICD-10-CM

## 2011-07-16 DIAGNOSIS — I10 Essential (primary) hypertension: Secondary | ICD-10-CM

## 2011-07-16 DIAGNOSIS — K219 Gastro-esophageal reflux disease without esophagitis: Secondary | ICD-10-CM

## 2011-07-16 LAB — CBC WITH DIFFERENTIAL/PLATELET
Eosinophils Absolute: 0.1 10*3/uL (ref 0.0–0.7)
MCHC: 34.5 g/dL (ref 30.0–36.0)
MCV: 93 fl (ref 78.0–100.0)
Monocytes Absolute: 0.3 10*3/uL (ref 0.1–1.0)
Neutrophils Relative %: 66.4 % (ref 43.0–77.0)
Platelets: 177 10*3/uL (ref 150.0–400.0)
WBC: 4.3 10*3/uL — ABNORMAL LOW (ref 4.5–10.5)

## 2011-07-16 LAB — LIPID PANEL
Cholesterol: 151 mg/dL (ref 0–200)
LDL Cholesterol: 86 mg/dL (ref 0–99)
Total CHOL/HDL Ratio: 3
VLDL: 20.4 mg/dL (ref 0.0–40.0)

## 2011-07-16 LAB — COMPREHENSIVE METABOLIC PANEL
ALT: 20 U/L (ref 0–35)
AST: 38 U/L — ABNORMAL HIGH (ref 0–37)
Albumin: 3.5 g/dL (ref 3.5–5.2)
Alkaline Phosphatase: 78 U/L (ref 39–117)
BUN: 15 mg/dL (ref 6–23)
Potassium: 3.5 mEq/L (ref 3.5–5.1)

## 2011-07-16 LAB — HEPATIC FUNCTION PANEL
ALT: 20 U/L (ref 0–35)
AST: 38 U/L — ABNORMAL HIGH (ref 0–37)
Bilirubin, Direct: 0 mg/dL (ref 0.0–0.3)
Total Bilirubin: 0.3 mg/dL (ref 0.3–1.2)
Total Protein: 7 g/dL (ref 6.0–8.3)

## 2011-07-16 MED ORDER — TAMSULOSIN HCL 0.4 MG PO CAPS: 0.4000 mg | ORAL_CAPSULE | Freq: Every day | ORAL | Status: DC

## 2011-07-16 MED ORDER — HYDROCORTISONE ACETATE 25 MG RE SUPP: 25.0000 mg | Freq: Two times a day (BID) | RECTAL | Status: AC

## 2011-07-16 NOTE — Patient Instructions (Addendum)
For hemorrhoids - a virtual sitz bath: you can use a washcloth and very warm tap water - hold the washcloth against the anus for 2-3 minutes and repeat the process for a total of 10 + minutes at least twice a day when the hemorrhoids are active. You may also use Anusol HC 2.5% suppositories twice a day for active internal hemorrhoids and anusol 2.5% cream for external hemorrhoids. Try to never strain at stool.   For yeast control - the natural remedies are certainly safe, I cannot attest the their efficacy. The body is pretty good about maintaining the appropriate pH. For a frank yeast infection there are medications that we can use.   I believe that you have a cystocele - a dropped bladder so that it pushes down on the vaginal wall and can cause a bulging, that can be severe.  I will leave all things related to the uterus, cervix and vaginal canal to Dr. Eda Paschal,   For difficulty with bladder emptying - it is off-label but we can try floxmax for 5-7 days to see if this makes a difference. (Rx sent to [facility] for a 10 day supply)  For sleep it is ok to use the one Xanax at bedtime.   For stomach - it is ok to use the prilosec on an as needed basis.

## 2011-07-16 NOTE — Telephone Encounter (Signed)
Order in to [facility]. They may need to know which shoulder is the problem

## 2011-07-16 NOTE — Telephone Encounter (Signed)
PT WAS SEEN TODAY.  SHE IS REQUESTING TO BE REFERRED FOR PHYSICAL THERAPY ON HER SHOULDER.

## 2011-07-18 ENCOUNTER — Telehealth: Payer: Self-pay | Admitting: *Deleted

## 2011-07-18 NOTE — Telephone Encounter (Signed)
Pt wanted MD to know that she has been able to urinate these past couple of days (she goes more at night than during the day) and that she hasn't started the medication yet for her bladder. She states that she will start medication tomorrow morning and let MD know if medication is helping.

## 2011-07-18 NOTE — Telephone Encounter (Signed)
Pt left message regarding medication for bladder and having difficulty urinating. Left message for pt to callback office.

## 2011-07-19 ENCOUNTER — Encounter: Payer: Self-pay | Admitting: Internal Medicine

## 2011-07-20 NOTE — Assessment & Plan Note (Signed)
On-going problem, both internal and possibly external.  Plan- routine care with sitz baths, or washcloth variant; cortisone hemorrhoidal cream and suppositories, no straining to have BM, dietary mgt to keep a easy bowel habit - fiber, fruit.

## 2011-07-20 NOTE — Assessment & Plan Note (Signed)
OK to use omeprazole on a prn basis.

## 2011-07-20 NOTE — Assessment & Plan Note (Signed)
Long standing problem with a new twist of having a hard time starting her stream. She does follow with Dr. Patsi Sears.  Plan - continue all her present meds           Short trial of tamsulosin to see if this allows for easier initiation of micturition.

## 2011-07-20 NOTE — Assessment & Plan Note (Signed)
BP Readings from Last 3 Encounters:  07/16/11 98/68  02/14/11 146/82  02/08/11 114/72   No problem with BP at this time. No change in regimen.

## 2011-07-20 NOTE — Progress Notes (Signed)
  Subjective:    Patient ID: [name] Small, female    DOB: 1917-03-15, 78 y.o.   MRN: [id number]  HPI [name] Small presents to review the problems she is having with bladder function and hemorrhoids with a difficult bowel habit. These are long-standing problems. We reviewed her notes and questions. An explanation was provided in regard to a cystocele and how this effects the bladder. She fels at times that she has great difficulty initiating urination. Fortunately she is not having any vaginal bleeding at this time. She is having trouble with rectal bleeding intermittently. On occasion she will have dyspepsia for which she will take prilosec on a prn basis. She does use xanax occasionally for sleep and is reassured that this is safe. Her readings have raised a question in her mind of a diffuse,systemic yeast infection!. She has tried a variety of herbal/otc products which she feels has helped her.   I have reviewed the patient's medical history in detail and updated the computerized patient record.    Review of Systems  Constitutional: Negative for fever, chills, activity change, appetite change and unexpected weight change.  HENT: Negative.   Eyes: Negative.   Respiratory: Negative.   Cardiovascular: Negative.   Gastrointestinal: Positive for blood in stool and anal bleeding. Negative for vomiting, abdominal pain, abdominal distention and rectal pain.  Genitourinary: Positive for frequency and difficulty urinating. Negative for dysuria, urgency, hematuria, flank pain, enuresis and genital sores.  Musculoskeletal: Negative.   Skin: Negative.   Neurological: Negative.   Hematological: Negative.   Psychiatric/Behavioral: Negative.        Objective:   Physical Exam Filed Vitals:   07/16/11 1049  BP: 98/68  Pulse: 80  Temp: 97.6 F (36.4 C)  Resp: 14   Gen'l - very thin elderly white woman in no distress HEENT - temporal wasting noted, very dry mucus membranes, PERRLA PUlm -  normal respirations Cor- 2+ radial, RRR Neuro - A&O x 3, CN II-XII grossly intact, normal ambulation.       Assessment & Plan:  Suggested that if she has any definite signs of a yeast infection she should call and a course of dilfucan can be initiated  (greater than 70% of a 25 min visit spent on education and counseling)

## 2011-07-23 ENCOUNTER — Telehealth: Payer: Self-pay | Admitting: *Deleted

## 2011-07-23 NOTE — Telephone Encounter (Signed)
Order done and left at triage.

## 2011-07-23 NOTE — Telephone Encounter (Signed)
Nurse requesting Order for PT/shoulder to be faxed to [facility] at [phone]

## 2011-07-23 NOTE — Telephone Encounter (Signed)
Faxed to [facility] AttnElita Quick [phone]

## 2011-07-26 DIAGNOSIS — M6281 Muscle weakness (generalized): Secondary | ICD-10-CM | POA: Diagnosis not present

## 2011-07-30 DIAGNOSIS — M6281 Muscle weakness (generalized): Secondary | ICD-10-CM | POA: Diagnosis not present

## 2011-08-02 DIAGNOSIS — M6281 Muscle weakness (generalized): Secondary | ICD-10-CM | POA: Diagnosis not present

## 2011-08-06 DIAGNOSIS — M6281 Muscle weakness (generalized): Secondary | ICD-10-CM | POA: Diagnosis not present

## 2011-08-09 DIAGNOSIS — M6281 Muscle weakness (generalized): Secondary | ICD-10-CM | POA: Diagnosis not present

## 2011-08-13 DIAGNOSIS — M6281 Muscle weakness (generalized): Secondary | ICD-10-CM | POA: Diagnosis not present

## 2011-08-15 ENCOUNTER — Other Ambulatory Visit (HOSPITAL_BASED_OUTPATIENT_CLINIC_OR_DEPARTMENT_OTHER): Payer: Medicare Other | Admitting: Lab

## 2011-08-15 DIAGNOSIS — Z85038 Personal history of other malignant neoplasm of large intestine: Secondary | ICD-10-CM | POA: Diagnosis not present

## 2011-08-15 DIAGNOSIS — K589 Irritable bowel syndrome without diarrhea: Secondary | ICD-10-CM | POA: Diagnosis not present

## 2011-08-15 DIAGNOSIS — Z85820 Personal history of malignant melanoma of skin: Secondary | ICD-10-CM | POA: Diagnosis not present

## 2011-08-15 DIAGNOSIS — E039 Hypothyroidism, unspecified: Secondary | ICD-10-CM | POA: Diagnosis not present

## 2011-08-15 LAB — CBC WITH DIFFERENTIAL/PLATELET
Eosinophils Absolute: 0.2 10*3/uL (ref 0.0–0.5)
MONO#: 0.4 10*3/uL (ref 0.1–0.9)
NEUT#: 4.1 10*3/uL (ref 1.5–6.5)
RBC: 4.03 10*6/uL (ref 3.70–5.45)
RDW: 14.8 % — ABNORMAL HIGH (ref 11.2–14.5)
WBC: 6 10*3/uL (ref 3.9–10.3)
lymph#: 1.4 10*3/uL (ref 0.9–3.3)

## 2011-08-15 LAB — COMPREHENSIVE METABOLIC PANEL
Albumin: 4 g/dL (ref 3.5–5.2)
Alkaline Phosphatase: 95 U/L (ref 39–117)
CO2: 31 mEq/L (ref 19–32)
Glucose, Bld: 94 mg/dL (ref 70–99)
Potassium: 3.7 mEq/L (ref 3.5–5.3)
Sodium: 141 mEq/L (ref 135–145)
Total Protein: 7.2 g/dL (ref 6.0–8.3)

## 2011-08-15 LAB — CEA: CEA: <0.5 ng/mL (ref 0.0–5.0)

## 2011-08-16 DIAGNOSIS — M25519 Pain in unspecified shoulder: Secondary | ICD-10-CM | POA: Diagnosis not present

## 2011-08-16 DIAGNOSIS — M6281 Muscle weakness (generalized): Secondary | ICD-10-CM | POA: Diagnosis not present

## 2011-08-19 ENCOUNTER — Ambulatory Visit (HOSPITAL_BASED_OUTPATIENT_CLINIC_OR_DEPARTMENT_OTHER): Payer: Medicare Other | Admitting: Oncology

## 2011-08-19 VITALS — BP 147/75 | HR 76 | Temp 97.1°F | Ht 59.0 in | Wt 104.0 lb

## 2011-08-19 DIAGNOSIS — Z85038 Personal history of other malignant neoplasm of large intestine: Secondary | ICD-10-CM

## 2011-08-19 DIAGNOSIS — K589 Irritable bowel syndrome without diarrhea: Secondary | ICD-10-CM

## 2011-08-19 DIAGNOSIS — E039 Hypothyroidism, unspecified: Secondary | ICD-10-CM | POA: Diagnosis not present

## 2011-08-19 NOTE — Progress Notes (Signed)
Hematology and Oncology Follow Up Visit  [name] Small [id number] Jul 19, 1916 78 y.o. 08/19/2011 7:10 PM   Principle Diagnosis: Encounter Diagnosis  Name Primary?  . History of colon cancer, stage III Yes     Interim History:   [name].Small is still going strong at age 63! She was able to get up to [place] to see her grandson get married recently and had a wonderful time. She continues to have chronic problems with pelvic ligamentous laxity. This is making her very uncomfortable. She has not found any good solutions. She also has chronic problems with hemorrhoids. She just started on some herbal treatment and reading the label it looks like something I would put on a pizza with ingredients such as oregano and thyme which is supposed to balance her yeast population or something. She has not had any recent hematochezia or melena. She is followed closely by both gastroenterology and gynecology.  Medications: reviewed  Allergies:  Allergies  Allergen Reactions  . Crab (Shellfish Allergy)     sensitivity  . Demerol   . Meperidine Hcl Other (See Comments)    Drop in blood pressure    Review of Systems: Constitutional:   No constitutional symptoms Respiratory: No cough or dyspnea Cardiovascular:  No chest pain or palpitations Gastrointestinal: No change in bowel habit no hematochezia or melena Genito-Urinary: Occasional vaginal bleeding Musculoskeletal: No bone pain other than arthritis Neurologic: No headache or change in vision Skin: No rash or ecchymosis Remaining ROS negative.  Physical Exam: Blood pressure 147/75, pulse 76, temperature 97.1 F (36.2 C), temperature source Oral, height 4\' 11"  (1.499 m), weight 104 lb (47.174 kg). Wt Readings from Last 3 Encounters:  08/19/11 104 lb (47.174 kg)  07/16/11 103 lb (46.72 kg)  02/14/11 100 lb (45.36 kg)     General appearance: Thin but spry elderly woman HENNT: Pharynx no erythema or exudate Lymph nodes: No  lymphadenopathy Breasts: Not examined Lungs: Clear to auscultation resonant to percussion Heart: Regular rhythm no murmur Abdomen: Soft nontender no mass no organomegaly Extremities: No edema no calf tenderness Vascular: No cyanosis Neurologic: No focal deficit Skin: No rash or ecchymosis  Lab Results: Lab Results  Component Value Date   WBC 6.0 08/15/2011   HGB 12.6 08/15/2011   HCT 37.9 08/15/2011   MCV 93.9 08/15/2011   PLT 200 08/15/2011     Chemistry      Component Value Date/Time   NA 141 08/15/2011 1334   K 3.7 08/15/2011 1334   CL 101 08/15/2011 1334   CO2 31 08/15/2011 1334   BUN 17 08/15/2011 1334   CREATININE 0.67 08/15/2011 1334      Component Value Date/Time   CALCIUM 9.5 08/15/2011 1334   CALCIUM 9.6 01/06/2009 2228   ALKPHOS 95 08/15/2011 1334   AST 40* 08/15/2011 1334   ALT 29 08/15/2011 1334   BILITOT 0.4 08/15/2011 1334    CEA: <0.5  LDH 175    Impression and Plan: 1.  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  She remains free of obvious recurrence now out almost 11 years from diagnosis. 2. intermittent mild mild elevation of SGOT  3.  Irritable bowel syndrome. 4.  Hypothyroid on replacement. 5.  Pelvic ligament as laxity problems with cystocele/rectocele.  She really wants to avoid surgical procedures if at all possible.  She has close follow up with both her gynecologist and urologist in this regard.  CC:. Dr. Illene Regulus; Dr. [name]   Levert Feinstein, MD 3/4/20137:10 PM

## 2011-08-20 ENCOUNTER — Other Ambulatory Visit: Payer: Self-pay | Admitting: Internal Medicine

## 2011-08-20 DIAGNOSIS — H903 Sensorineural hearing loss, bilateral: Secondary | ICD-10-CM | POA: Diagnosis not present

## 2011-08-20 DIAGNOSIS — M6281 Muscle weakness (generalized): Secondary | ICD-10-CM | POA: Diagnosis not present

## 2011-08-20 DIAGNOSIS — M25519 Pain in unspecified shoulder: Secondary | ICD-10-CM | POA: Diagnosis not present

## 2011-08-20 NOTE — Telephone Encounter (Signed)
Done

## 2011-08-21 ENCOUNTER — Telehealth: Payer: Self-pay | Admitting: Oncology

## 2011-08-21 NOTE — Telephone Encounter (Signed)
Talked to pt, she does not want to come back next yea, she refuse 2014 appt with MD

## 2011-08-22 DIAGNOSIS — M6281 Muscle weakness (generalized): Secondary | ICD-10-CM | POA: Diagnosis not present

## 2011-08-22 DIAGNOSIS — M25519 Pain in unspecified shoulder: Secondary | ICD-10-CM | POA: Diagnosis not present

## 2011-08-27 DIAGNOSIS — M25519 Pain in unspecified shoulder: Secondary | ICD-10-CM | POA: Diagnosis not present

## 2011-08-27 DIAGNOSIS — M6281 Muscle weakness (generalized): Secondary | ICD-10-CM | POA: Diagnosis not present

## 2011-09-02 ENCOUNTER — Other Ambulatory Visit: Payer: Self-pay | Admitting: Internal Medicine

## 2011-09-05 DIAGNOSIS — M6281 Muscle weakness (generalized): Secondary | ICD-10-CM | POA: Diagnosis not present

## 2011-09-05 DIAGNOSIS — M25519 Pain in unspecified shoulder: Secondary | ICD-10-CM | POA: Diagnosis not present

## 2011-09-10 DIAGNOSIS — M2010 Hallux valgus (acquired), unspecified foot: Secondary | ICD-10-CM | POA: Diagnosis not present

## 2011-09-10 DIAGNOSIS — M2040 Other hammer toe(s) (acquired), unspecified foot: Secondary | ICD-10-CM | POA: Diagnosis not present

## 2011-09-11 DIAGNOSIS — H2510 Age-related nuclear cataract, unspecified eye: Secondary | ICD-10-CM | POA: Diagnosis not present

## 2011-09-11 DIAGNOSIS — H01009 Unspecified blepharitis unspecified eye, unspecified eyelid: Secondary | ICD-10-CM | POA: Diagnosis not present

## 2011-09-11 DIAGNOSIS — H353190 Nonexudative age-related macular degeneration, unspecified eye, stage unspecified: Secondary | ICD-10-CM | POA: Diagnosis not present

## 2011-09-12 DIAGNOSIS — M6281 Muscle weakness (generalized): Secondary | ICD-10-CM | POA: Diagnosis not present

## 2011-09-12 DIAGNOSIS — M25519 Pain in unspecified shoulder: Secondary | ICD-10-CM | POA: Diagnosis not present

## 2011-09-17 DIAGNOSIS — M25519 Pain in unspecified shoulder: Secondary | ICD-10-CM | POA: Diagnosis not present

## 2011-09-17 DIAGNOSIS — M6281 Muscle weakness (generalized): Secondary | ICD-10-CM | POA: Diagnosis not present

## 2011-09-19 ENCOUNTER — Ambulatory Visit: Payer: Medicare Other | Admitting: Obstetrics and Gynecology

## 2011-09-20 ENCOUNTER — Ambulatory Visit (INDEPENDENT_AMBULATORY_CARE_PROVIDER_SITE_OTHER): Payer: Medicare Other | Admitting: Obstetrics and Gynecology

## 2011-09-20 DIAGNOSIS — N952 Postmenopausal atrophic vaginitis: Secondary | ICD-10-CM

## 2011-09-20 DIAGNOSIS — N8111 Cystocele, midline: Secondary | ICD-10-CM | POA: Diagnosis not present

## 2011-09-20 DIAGNOSIS — IMO0002 Reserved for concepts with insufficient information to code with codable children: Secondary | ICD-10-CM

## 2011-09-20 DIAGNOSIS — B373 Candidiasis of vulva and vagina: Secondary | ICD-10-CM | POA: Diagnosis not present

## 2011-09-20 NOTE — Progress Notes (Signed)
Patient came back to see me today. The main reason for her visit was to change her Estring and she cannot do it herself. She said she felt it about a week ago. She also has had trouble with her current yeast vaginitis. She has been using both a probiotic to change her pH and a herbal product and she says that she's had much less trouble with her yeast infections.  Exam:[name] Small present. EXT: wnl BUS: wnl Vaginal exam: better estrogen effect with no evidence of yeast. No evidence of her previous Estring. Bladder- third degree cystocele.  Assessment: #1. Atrophic vaginitis #2. Recurrent yeast vaginitis #3. Cystocele  Plan: Patient was carefully examined and she has  clearly passed her ring. A new Estring was placed. The patient got dressed and said that the ring fell out. I told her that her cystocele is worse and  I don't think she'll be able to keep it in. We however tried again and replaced the ring.

## 2011-09-27 ENCOUNTER — Ambulatory Visit (INDEPENDENT_AMBULATORY_CARE_PROVIDER_SITE_OTHER): Payer: Medicare Other | Admitting: Internal Medicine

## 2011-09-27 ENCOUNTER — Encounter: Payer: Self-pay | Admitting: Internal Medicine

## 2011-09-27 VITALS — BP 102/60 | HR 64 | Ht 59.0 in | Wt 103.0 lb

## 2011-09-27 DIAGNOSIS — K589 Irritable bowel syndrome without diarrhea: Secondary | ICD-10-CM

## 2011-09-27 DIAGNOSIS — K648 Other hemorrhoids: Secondary | ICD-10-CM | POA: Diagnosis not present

## 2011-09-27 NOTE — Patient Instructions (Signed)
Please continue your current treatment regimen  for your hemorrhoids.  Follow-Up with [place] as needed.

## 2011-09-27 NOTE — Assessment & Plan Note (Signed)
Chronic and recurrent. Has decided against procedural intervention. Continue current care.

## 2011-09-27 NOTE — Progress Notes (Signed)
  Subjective:    Patient ID: [name] Small, female    DOB: Dec 12, 1916, 78 y.o.   MRN: [id number]  HPI This delightful elderly widowed white woman returns with problems related to hemorrhoids. She reports that she is concerned about the prolapsing of the hemorrhoids and the occasional bleeding that occurs. She has also developed worsening prolapse of her bladder. She has atrophic vaginitis problems as well. A pessary is apparently not an option for her bladder prolapse.  In general she is trying not to strain to stool but will occasionally have protruding hemorrhoids which he has to manually reduced. There is no complaint of pain but when she sees blood if concerns her. She either uses hydrocortisone suppositories or cream on an intermittent basis. She has been using warmer hot loss clots in the anorectal area and thought maybe she had overdone it. She is now, at the direction of her primary care provider just using warm washcloth and nothing hot, just applying them and not causing any abrasions.  She has difficulty with urination and will also defecate a small amount when she urinates.  She continues with irregular bowel habits that time but again is not straining to stool much and there is no severe diarrhea reported.  Medications, allergies, past medical history, past surgical history, family history and social history are reviewed and updated in the EMR.   Review of Systems As per history of present illness. She is unable to retain estrogen ring device to treat atrophic vaginitis anymore either.    Objective:   Physical Exam Well-developed thin elderly woman very spry for age With female staff present, rectal examination reveals some minimal perianal erythema but more erythema and what looks to be slightly denuded skin in the perineal body area towards the vagina. The anal sphincter is somewhat lax, digital exam and inspection shows hemorrhoids in the anal canal, she has formed brown  stool in the rectum.       Assessment & Plan:   1. Internal prolapsed hemorrhoids   I don't think there is anything more we can do for these other than her symptomatic treatment. Last year she asked about more definitive therapy and I spoke to her nephew, a gastroenterologist, at her request. He had been appointed spokesperson for her family and the overwhelming thoughts were to avoid any type of procedures that could lead to complications. The patient supports and abide by those recommendations still. I think she's doing about as well she can with this problem considering the circumstances. I will see her back as needed.   2. IBS (irritable bowel syndrome)   Overall stable, continue current therapy.

## 2011-10-01 DIAGNOSIS — H903 Sensorineural hearing loss, bilateral: Secondary | ICD-10-CM | POA: Diagnosis not present

## 2011-10-09 ENCOUNTER — Ambulatory Visit (INDEPENDENT_AMBULATORY_CARE_PROVIDER_SITE_OTHER)
Admission: RE | Admit: 2011-10-09 | Discharge: 2011-10-09 | Disposition: A | Discharge status: Home / Self Care | Payer: Medicare Other | Source: Ambulatory Visit | Attending: Internal Medicine | Admitting: Internal Medicine

## 2011-10-09 ENCOUNTER — Ambulatory Visit (INDEPENDENT_AMBULATORY_CARE_PROVIDER_SITE_OTHER): Payer: Medicare Other | Admitting: Internal Medicine

## 2011-10-09 ENCOUNTER — Encounter: Payer: Self-pay | Admitting: Internal Medicine

## 2011-10-09 VITALS — BP 112/68 | HR 75 | Temp 97.8°F | Resp 16 | Wt 102.0 lb

## 2011-10-09 DIAGNOSIS — S8992XA Unspecified injury of left lower leg, initial encounter: Secondary | ICD-10-CM

## 2011-10-09 DIAGNOSIS — Z23 Encounter for immunization: Secondary | ICD-10-CM | POA: Diagnosis not present

## 2011-10-09 DIAGNOSIS — T07XXXA Unspecified multiple injuries, initial encounter: Secondary | ICD-10-CM

## 2011-10-09 DIAGNOSIS — S8991XA Unspecified injury of right lower leg, initial encounter: Secondary | ICD-10-CM

## 2011-10-09 DIAGNOSIS — M25569 Pain in unspecified knee: Secondary | ICD-10-CM | POA: Diagnosis not present

## 2011-10-09 DIAGNOSIS — IMO0002 Reserved for concepts with insufficient information to code with codable children: Secondary | ICD-10-CM | POA: Diagnosis not present

## 2011-10-09 DIAGNOSIS — M25561 Pain in right knee: Secondary | ICD-10-CM | POA: Insufficient documentation

## 2011-10-09 DIAGNOSIS — S99919A Unspecified injury of unspecified ankle, initial encounter: Secondary | ICD-10-CM

## 2011-10-09 DIAGNOSIS — S8990XA Unspecified injury of unspecified lower leg, initial encounter: Secondary | ICD-10-CM

## 2011-10-09 DIAGNOSIS — S99929A Unspecified injury of unspecified foot, initial encounter: Secondary | ICD-10-CM | POA: Diagnosis not present

## 2011-10-09 MED ORDER — ZOLPIDEM TARTRATE 5 MG PO TABS: 5.0000 mg | ORAL_TABLET | Freq: Every evening | ORAL | Status: DC | PRN

## 2011-10-09 MED ORDER — CELECOXIB 200 MG PO CAPS: 200.0000 mg | ORAL_CAPSULE | Freq: Every day | ORAL | Status: AC

## 2011-10-09 NOTE — Assessment & Plan Note (Signed)
Td today and local wound care

## 2011-10-09 NOTE — Patient Instructions (Signed)
Knee Sprain  You have a knee sprain. Sprains are painful injuries to the joints. A sprain is a partial or complete tearing of ligaments. Ligaments are tough, fibrous tissues that hold bones together at the joints. A strain (sprain) has occurred when a ligament is stretched or damaged. This injury may take several weeks to heal. This is often the same length of time as a bone fracture (break in bone) takes to heal. Even though a fracture (bone break) may not have occurred, the recovery times may be similar.  HOME CARE INSTRUCTIONS   · Rest the injured area for as long as directed by your caregiver. Then slowly start using the joint as directed by your caregiver and as the pain allows. Use crutches as directed. If the knee was splinted or casted, continue use and care as directed. If an ace bandage has been applied today, it should be removed and reapplied every 3 to 4 hours. It should not be applied tightly, but firmly enough to keep swelling down. Watch toes and feet for swelling, bluish discoloration, coldness, numbness or excessive pain. If any of these symptoms occur, remove the ace bandage and reapply more loosely.If these symptoms persist, seek medical attention.  · For the first 24 hours, lie down. Keep the injured extremity elevated on two pillows.  · Apply ice to the injured area for 15 to 20 minutes every couple hours. Repeat this 3 to 4 times per day for the first 48 hours. Put the ice in a plastic bag and place a towel between the bag of ice and your skin.  · Wear any splinting, casting, or elastic bandage applications as instructed.  · Only take over-the-counter or prescription medicines for pain, discomfort, or fever as directed by your caregiver. Do not use aspirin immediately after the injury unless instructed by your caregiver. Aspirin can cause increased bleeding and bruising of the tissues.  · If you were given crutches, continue to use them as instructed. Do not resume weight bearing on the  affected extremity until instructed.  Persistent pain and inability to use the injured area as directed for more than 2 to 3 days are warning signs. If this happens you should see a caregiver for a follow-up visit as soon as possible. Initially, a hairline fracture (this is the same as a broken bone) may not be evident on x-rays. Persistent pain and swelling indicate that further evaluation, non-weight bearing (use of crutches as instructed), and/or further x-rays are indicated. X-rays may sometimes not show a small fracture until a week or ten days later. Make a follow-up appointment with your own caregiver or one to whom we have referred you. A radiologist (specialist in reading x-rays) may re-read your X-rays. Make sure you know how you are to get your x-ray results. Do not assume everything is normal if you do not hear from us.  SEEK MEDICAL CARE IF:   · Bruising, swelling, or pain increases.  · You have cold or numb toes  · You have continuing difficulty or pain with walking.  SEEK IMMEDIATE MEDICAL CARE IF:   · Your toes are cold, numb or blue.  · The pain is not responding to medications and continues to stay the same or get worse.  MAKE SURE YOU:   · Understand these instructions.  · Will watch your condition.  · Will get help right away if you are not doing well or get worse.  Document Released: 06/03/2005 Document Revised: 05/23/2011 Document Reviewed: 05/18/2007    ExitCare® Patient Information ©2012 [employer].

## 2011-10-09 NOTE — Assessment & Plan Note (Signed)
Her plain films are unremarkable, she will try celebrex for the pain and will rest ice elevate

## 2011-10-09 NOTE — Progress Notes (Signed)
Subjective:    Patient ID: [name] Small, female    DOB: 1917/03/14, 78 y.o.   MRN: [id number]  Fall The accident occurred 1 to 3 hours ago. The fall occurred while walking. She fell from a height of 1 to 2 ft. She landed on hard floor. There was no blood loss. The point of impact was the left knee and right knee. The pain is present in the left knee and right knee. The pain is at a severity of 2/10. The pain is mild. The symptoms are aggravated by ambulation. Pertinent negatives include no abdominal pain, bowel incontinence, fever, headaches, hearing loss, hematuria, loss of consciousness, nausea, numbness, tingling, visual change or vomiting. She has tried nothing for the symptoms.      Review of Systems  Constitutional: Negative.  Negative for fever.  HENT: Negative.   Eyes: Negative.   Respiratory: Negative.   Cardiovascular: Negative.   Gastrointestinal: Negative.  Negative for nausea, vomiting, abdominal pain and bowel incontinence.  Genitourinary: Negative.  Negative for hematuria.  Musculoskeletal: Positive for arthralgias (soreness in both knees). Negative for myalgias, back pain, joint swelling and gait problem.  Skin: Negative.   Neurological: Negative.  Negative for tingling, loss of consciousness, numbness and headaches.  Hematological: Negative.  Negative for adenopathy. Does not bruise/bleed easily.  Psychiatric/Behavioral: Positive for sleep disturbance. Negative for suicidal ideas, hallucinations, behavioral problems, confusion, self-injury, dysphoric mood, decreased concentration and agitation. The patient is nervous/anxious. The patient is not hyperactive.        Objective:   Physical Exam  Vitals reviewed. Constitutional: She is oriented to person, place, and time. She appears well-developed and well-nourished. No distress.  HENT:  Head: Normocephalic and atraumatic.  Mouth/Throat: Oropharynx is clear and moist. No oropharyngeal exudate.  Eyes: Conjunctivae  are normal. Right eye exhibits no discharge. Left eye exhibits no discharge. No scleral icterus.  Neck: Normal range of motion. Neck supple. No JVD present. No tracheal deviation present. No thyromegaly present.  Cardiovascular: Normal rate, regular rhythm, normal heart sounds and intact distal pulses.  Exam reveals no gallop and no friction rub.   No murmur heard. Pulmonary/Chest: Effort normal and breath sounds normal. No stridor. No respiratory distress. She has no wheezes. She has no rales. She exhibits no tenderness.  Abdominal: Soft. Bowel sounds are normal. She exhibits no distension and no mass. There is no tenderness. There is no rebound and no guarding.  Musculoskeletal: Normal range of motion. She exhibits tenderness. She exhibits no edema.       Right knee: She exhibits swelling, deformity (abrasion over the patella) and bony tenderness (over the patella). She exhibits normal range of motion, no effusion, no ecchymosis, no laceration, no erythema, normal alignment, no LCL laxity, normal patellar mobility, normal meniscus and no MCL laxity. no tenderness found. No medial joint line, no lateral joint line, no MCL, no LCL and no patellar tendon tenderness noted.       Left knee: She exhibits swelling, deformity (abrasion over the patella) and bony tenderness (over the patella). She exhibits normal range of motion, no effusion, no ecchymosis, no laceration, no erythema, normal alignment, no LCL laxity, normal patellar mobility, normal meniscus and no MCL laxity. no tenderness found. No medial joint line, no lateral joint line, no MCL, no LCL and no patellar tendon tenderness noted.  Lymphadenopathy:    She has no cervical adenopathy.  Neurological: She is alert and oriented to person, place, and time. She has normal reflexes. She displays normal reflexes.  No cranial nerve deficit. She exhibits normal muscle tone. Coordination normal.  Skin: Skin is warm and dry. No rash noted. She is not  diaphoretic. No erythema. No pallor.  Psychiatric: She has a normal mood and affect. Her behavior is normal. Judgment and thought content normal.          Assessment & Plan:

## 2011-10-09 NOTE — Assessment & Plan Note (Signed)
Xray shows a loose body over the head of the fibula, that is not where her pain or her injury are so I don't think her films are remarkable. She will start celebrex and will ice rest elevate

## 2011-10-14 ENCOUNTER — Telehealth: Payer: Self-pay

## 2011-10-14 NOTE — Telephone Encounter (Signed)
normal

## 2011-10-14 NOTE — Telephone Encounter (Signed)
Pt called requesting results of xray, please advise

## 2011-10-15 NOTE — Telephone Encounter (Signed)
Patient notified

## 2011-10-23 ENCOUNTER — Encounter: Payer: Self-pay | Admitting: Obstetrics and Gynecology

## 2011-10-23 ENCOUNTER — Ambulatory Visit (INDEPENDENT_AMBULATORY_CARE_PROVIDER_SITE_OTHER): Payer: Medicare Other | Admitting: Obstetrics and Gynecology

## 2011-10-23 ENCOUNTER — Other Ambulatory Visit: Payer: Self-pay | Admitting: Obstetrics and Gynecology

## 2011-10-23 VITALS — BP 116/70 | Ht 60.0 in | Wt 102.0 lb

## 2011-10-23 DIAGNOSIS — L821 Other seborrheic keratosis: Secondary | ICD-10-CM | POA: Diagnosis not present

## 2011-10-23 DIAGNOSIS — M810 Age-related osteoporosis without current pathological fracture: Secondary | ICD-10-CM

## 2011-10-23 DIAGNOSIS — N898 Other specified noninflammatory disorders of vagina: Secondary | ICD-10-CM

## 2011-10-23 DIAGNOSIS — IMO0002 Reserved for concepts with insufficient information to code with codable children: Secondary | ICD-10-CM

## 2011-10-23 DIAGNOSIS — N952 Postmenopausal atrophic vaginitis: Secondary | ICD-10-CM

## 2011-10-23 DIAGNOSIS — R300 Dysuria: Secondary | ICD-10-CM | POA: Diagnosis not present

## 2011-10-23 DIAGNOSIS — N8111 Cystocele, midline: Secondary | ICD-10-CM | POA: Diagnosis not present

## 2011-10-23 DIAGNOSIS — K649 Unspecified hemorrhoids: Secondary | ICD-10-CM

## 2011-10-23 LAB — URINALYSIS W MICROSCOPIC + REFLEX CULTURE
Bilirubin Urine: NEGATIVE
Casts: NONE SEEN
Crystals: NONE SEEN
Glucose, UA: NEGATIVE mg/dL
Ketones, ur: NEGATIVE mg/dL
Nitrite: NEGATIVE
Protein, ur: 30 mg/dL — AB
Specific Gravity, Urine: <1.005 — ABNORMAL LOW (ref 1.005–1.030)
Urobilinogen, UA: 0.2 mg/dL (ref 0.0–1.0)
pH: 5.5 (ref 5.0–8.0)

## 2011-10-23 LAB — WET PREP FOR TRICH, YEAST, CLUE
Clue Cells Wet Prep HPF POC: NONE SEEN
Trich, Wet Prep: NONE SEEN
Yeast Wet Prep HPF POC: NONE SEEN

## 2011-10-23 NOTE — Progress Notes (Signed)
Patient came to see me today because of a brown-red vaginal discharge that is consistent with bacterial vaginosis in her past experience. She also gets yeast infections frequently. She does not feel that what  she has. She is having no itching. She is seen no blood for several weeks. It never appeared to her to be like vaginal bleeding. She is having no pelvic pain. We have tried several times to reinsert her Estring but as her cystocele has  gotten worse it is impossible for her to keep it in. We have previously treated her with Actonel for osteoporosis. She only took it  2-to 3 years and stopped due to side effects. She takes calcium and vitamin D. She has had no fractures. She is due for followup bone density in September of 2013. She is also currently due for a mammogram. She has intermittent rectal bleeding. She is a colon cancer survivor and sees Dr. Leone Payor.  He feels she is bleeding from hemorrhoids and she uses a steroid cream with good results.  ROS: 12 System review done. Pertinent positives above. Other positives include hyperlipidemia, hypothyroidism, GERD, and neuropathy.  Physical examination: Selena Batten gardner present. HEENT within normal limits. Neck: Thyroid not large. No masses. Supraclavicular nodes: not enlarged. Breasts: Examined in both sitting and lying  position. No skin changes and no masses. Abdomen: Soft no guarding rebound or masses or hernia. Pelvic: External: Within normal limits. BUS: Within normal limits. Vaginal:within normal limits. Heavy whitish frothy discharge consistent with bv. fair estrogen effect. Almost third degree cystocele. Cervix: clean. Uterus: Normal size and shape. Adnexa: No masses. Rectovaginal exam: Confirmatory and negative. Wet prep shows too numerous to count white blood cells and bacteria. Urinalysis shows too numerous to count white blood cells with a 3-6 red blood cells.  Assessment: #1. Possible urinary tract infection #2. Possible bacterial vaginosis  #3. Osteoporosis #4. Cystocele.  Plan: We have cultured her urine and we will defer treatment until we see what it shows. Mammogram. Bone density at our office in October as she used to go to [place].

## 2011-10-23 NOTE — Patient Instructions (Signed)
Bone density at our office in October, 2013.

## 2011-10-25 LAB — URINE CULTURE: Colony Count: >=100000

## 2011-10-28 ENCOUNTER — Telehealth: Payer: Self-pay | Admitting: *Deleted

## 2011-10-28 ENCOUNTER — Other Ambulatory Visit: Payer: Self-pay | Admitting: *Deleted

## 2011-10-28 DIAGNOSIS — N390 Urinary tract infection, site not specified: Secondary | ICD-10-CM

## 2011-10-28 MED ORDER — AMOXICILLIN 250 MG PO CAPS: 250.0000 mg | ORAL_CAPSULE | Freq: Three times a day (TID) | ORAL | Status: AC

## 2011-10-28 NOTE — Progress Notes (Signed)
rx called into [facility] @ [facility]

## 2011-10-28 NOTE — Telephone Encounter (Signed)
Caller stating MOST form Dr. Debby Bud was signed but not dated. She will remind pt to bring this form to her next OV so it can be dated.

## 2011-10-30 ENCOUNTER — Telehealth: Payer: Self-pay | Admitting: *Deleted

## 2011-10-30 MED ORDER — CEPHALEXIN 500 MG PO CAPS: 500.0000 mg | ORAL_CAPSULE | Freq: Two times a day (BID) | ORAL | Status: AC

## 2011-10-30 NOTE — Telephone Encounter (Signed)
Pt was given amoxicillin 250 mg 3 times a day for 7 days for UTI on 10/28/11 pt said that medication make she have diarrhea. Pt would like something else. Please advise

## 2011-10-30 NOTE — Telephone Encounter (Signed)
Addended by: Aura Camps on: 10/30/2011 10:46 AM   Modules accepted: Orders

## 2011-10-30 NOTE — Telephone Encounter (Signed)
Keflex 500 mg twice a day for 7 days.

## 2011-10-30 NOTE — Telephone Encounter (Signed)
Pt called back c/o UTI infection also c/o burning and painful with urination.

## 2011-10-30 NOTE — Telephone Encounter (Signed)
Pt informed with the below, rx sent 

## 2011-11-08 DIAGNOSIS — H25019 Cortical age-related cataract, unspecified eye: Secondary | ICD-10-CM | POA: Diagnosis not present

## 2011-11-08 DIAGNOSIS — H04129 Dry eye syndrome of unspecified lacrimal gland: Secondary | ICD-10-CM | POA: Diagnosis not present

## 2011-11-08 DIAGNOSIS — H532 Diplopia: Secondary | ICD-10-CM | POA: Diagnosis not present

## 2011-11-08 DIAGNOSIS — H3530 Unspecified macular degeneration: Secondary | ICD-10-CM | POA: Diagnosis not present

## 2011-11-12 ENCOUNTER — Ambulatory Visit (INDEPENDENT_AMBULATORY_CARE_PROVIDER_SITE_OTHER): Payer: Medicare Other | Admitting: Obstetrics and Gynecology

## 2011-11-12 ENCOUNTER — Other Ambulatory Visit: Payer: Self-pay | Admitting: Obstetrics and Gynecology

## 2011-11-12 DIAGNOSIS — R300 Dysuria: Secondary | ICD-10-CM | POA: Diagnosis not present

## 2011-11-12 DIAGNOSIS — N390 Urinary tract infection, site not specified: Secondary | ICD-10-CM | POA: Diagnosis not present

## 2011-11-12 LAB — URINALYSIS W MICROSCOPIC + REFLEX CULTURE
Bilirubin Urine: NEGATIVE
Protein, ur: NEGATIVE mg/dL
Urobilinogen, UA: 0.2 mg/dL (ref 0.0–1.0)

## 2011-11-12 NOTE — Progress Notes (Signed)
Patient came back today for followup of urinary tract infection. Her culture grew out beta strep included 100,000 colonies. She was initially treated with ampicillin but developed diarrhea. We switched her to Keflex and initially she did well with relief of her symptoms but 2 days ago she developed pain again. She came in today for followup and was unable to give [place] a specimen.  Exam: External: Within normal limits. BUS: Within normal limits. Vaginal exam: Still reveals a large cystocele. Kennon Portela present during exam.  Assessment: Possible persistent urinary tract infection.  Plan: Since she could not void a catheter specimen was obtained. It shows a significant number of white blood cells and red blood cells. We will culture it. Today she is asymptomatic so we elected not to treat her until  sensitivities back.

## 2011-11-13 ENCOUNTER — Ambulatory Visit: Payer: Medicare Other | Admitting: Internal Medicine

## 2011-11-13 ENCOUNTER — Other Ambulatory Visit: Payer: Self-pay | Admitting: Internal Medicine

## 2011-11-14 ENCOUNTER — Telehealth: Payer: Self-pay | Admitting: *Deleted

## 2011-11-14 NOTE — Telephone Encounter (Signed)
Left message on voicemail that urine culture result are not back.

## 2011-11-15 LAB — URINE CULTURE: Colony Count: 75000

## 2011-11-15 MED ORDER — LEVOFLOXACIN 250 MG PO TABS: 250.0000 mg | ORAL_TABLET | Freq: Every day | ORAL | Status: AC

## 2011-11-15 NOTE — Progress Notes (Signed)
Addended by: Venora Maples on: 11/15/2011 04:38 PM   Modules accepted: Orders

## 2011-11-15 NOTE — Progress Notes (Signed)
PT. NOTIFIED OF UTI PER DR. G AND CALLED IN RX TO [facility] AT [address]. PT. KNOWS TO RETURN FOR FOLLOW UP URINE CULTURE ALSO AND ORDER IS IN [place],

## 2011-11-18 ENCOUNTER — Other Ambulatory Visit: Payer: Self-pay | Admitting: *Deleted

## 2011-11-18 MED ORDER — ALPRAZOLAM 0.25 MG PO TABS: 0.2500 mg | ORAL_TABLET | Freq: Four times a day (QID) | ORAL | Status: DC | PRN

## 2011-11-18 NOTE — Telephone Encounter (Signed)
Rx call into [facility] spoke with Jay/pharmacist gave md response.MD updated EPIC... 11/18/11@2 :36pm/LMB

## 2011-11-18 NOTE — Telephone Encounter (Signed)
Pt requesting refill on her alprazolam. Is this ok to refill?... 11/18/11@12 :30pm/LMB

## 2011-11-20 ENCOUNTER — Telehealth: Payer: Self-pay | Admitting: *Deleted

## 2011-11-20 NOTE — Telephone Encounter (Signed)
error 

## 2011-11-21 ENCOUNTER — Ambulatory Visit (INDEPENDENT_AMBULATORY_CARE_PROVIDER_SITE_OTHER): Payer: Medicare Other | Admitting: Internal Medicine

## 2011-11-21 ENCOUNTER — Other Ambulatory Visit (INDEPENDENT_AMBULATORY_CARE_PROVIDER_SITE_OTHER): Payer: Medicare Other

## 2011-11-21 ENCOUNTER — Encounter: Payer: Self-pay | Admitting: Internal Medicine

## 2011-11-21 VITALS — BP 120/70 | HR 64 | Temp 98.1°F | Resp 16 | Ht <= 58 in | Wt 100.0 lb

## 2011-11-21 DIAGNOSIS — M169 Osteoarthritis of hip, unspecified: Secondary | ICD-10-CM

## 2011-11-21 DIAGNOSIS — E039 Hypothyroidism, unspecified: Secondary | ICD-10-CM

## 2011-11-21 DIAGNOSIS — D638 Anemia in other chronic diseases classified elsewhere: Secondary | ICD-10-CM

## 2011-11-21 DIAGNOSIS — D649 Anemia, unspecified: Secondary | ICD-10-CM | POA: Diagnosis not present

## 2011-11-21 DIAGNOSIS — I10 Essential (primary) hypertension: Secondary | ICD-10-CM | POA: Diagnosis not present

## 2011-11-21 DIAGNOSIS — R5381 Other malaise: Secondary | ICD-10-CM | POA: Diagnosis not present

## 2011-11-21 DIAGNOSIS — R5383 Other fatigue: Secondary | ICD-10-CM

## 2011-11-21 LAB — COMPREHENSIVE METABOLIC PANEL
AST: 33 U/L (ref 0–37)
Albumin: 3.4 g/dL — ABNORMAL LOW (ref 3.5–5.2)
Alkaline Phosphatase: 80 U/L (ref 39–117)
Potassium: 4.3 mEq/L (ref 3.5–5.1)
Sodium: 141 mEq/L (ref 135–145)
Total Bilirubin: 0.7 mg/dL (ref 0.3–1.2)
Total Protein: 7 g/dL (ref 6.0–8.3)

## 2011-11-21 LAB — CBC WITH DIFFERENTIAL/PLATELET
Eosinophils Relative: 2.4 % (ref 0.0–5.0)
HCT: 35.4 % — ABNORMAL LOW (ref 36.0–46.0)
Lymphocytes Relative: 23 % (ref 12.0–46.0)
Monocytes Relative: 7.9 % (ref 3.0–12.0)
Neutrophils Relative %: 66.2 % (ref 43.0–77.0)
Platelets: 194 10*3/uL (ref 150.0–400.0)
WBC: 5.2 10*3/uL (ref 4.5–10.5)

## 2011-11-21 LAB — IBC PANEL
Iron: 60 ug/dL (ref 42–145)
Saturation Ratios: 14.1 % — ABNORMAL LOW (ref 20.0–50.0)
Transferrin: 303.4 mg/dL (ref 212.0–360.0)

## 2011-11-21 NOTE — Progress Notes (Signed)
  Subjective:    Patient ID: [name] Small, female    DOB: Feb 13, 1917, 78 y.o.   MRN: [id number]  HPI [name] Small presents for lack of energy. She is concerned about loss of weight - down to 100 lbs. She has also been diagnosed with bladder infection - she has been treated with levaquin.   No specific complaints  She did fail and landed on her left knee. She used ice for relief. No problems with ambulation.  PMH, FamHx and SocHx reviewed for any changes and relevance.   Review of Systems System review is negative for any constitutional, cardiac, pulmonary, GI or neuro symptoms or complaints other than as described in the HPI.      Objective:   Physical Exam Filed Vitals:   11/21/11 1052  BP: 120/70  Pulse: 64  Temp: 98.1 F (36.7 C)  Resp: 16   Gen'l - very thin, elderly white woman in no distress HEENT- C&SE clear Cor- RRR PUlm - CTAP Abd- soft. Ext - left knee with full ROM, no effusion, no crepitus.  Lab Results  Component Value Date   WBC 5.2 11/21/2011   HGB 11.7* 11/21/2011   HCT 35.4* 11/21/2011   PLT 194.0 11/21/2011   GLUCOSE 105* 11/21/2011   CHOL 151 07/16/2011   TRIG 102.0 07/16/2011   HDL 45.10 07/16/2011   LDLDIRECT 123.7 12/08/2007   LDLCALC 86 07/16/2011   ALT 19 11/21/2011   AST 33 11/21/2011   NA 141 11/21/2011   K 4.3 11/21/2011   CL 104 11/21/2011   CREATININE 0.6 11/21/2011   BUN 15 11/21/2011   CO2 31 11/21/2011   TSH 1.21 11/21/2011   HGBA1C 5.9 12/02/2006       Assessment & Plan:  Weakness - generalized, along with minor weight loss: exam is OK, labs are normal.

## 2011-11-22 ENCOUNTER — Other Ambulatory Visit: Payer: Medicare Other

## 2011-11-22 DIAGNOSIS — N390 Urinary tract infection, site not specified: Secondary | ICD-10-CM | POA: Diagnosis not present

## 2011-11-23 LAB — URINALYSIS W MICROSCOPIC + REFLEX CULTURE
Bacteria, UA: NONE SEEN
Bilirubin Urine: NEGATIVE
Casts: NONE SEEN
Crystals: NONE SEEN
Ketones, ur: NEGATIVE mg/dL
Nitrite: NEGATIVE
Specific Gravity, Urine: 1.006 (ref 1.005–1.030)
pH: 7 (ref 5.0–8.0)

## 2011-11-24 ENCOUNTER — Encounter: Payer: Self-pay | Admitting: Internal Medicine

## 2011-12-03 DIAGNOSIS — N952 Postmenopausal atrophic vaginitis: Secondary | ICD-10-CM | POA: Diagnosis not present

## 2011-12-03 DIAGNOSIS — N368 Other specified disorders of urethra: Secondary | ICD-10-CM | POA: Diagnosis not present

## 2011-12-03 DIAGNOSIS — R3129 Other microscopic hematuria: Secondary | ICD-10-CM | POA: Diagnosis not present

## 2011-12-04 ENCOUNTER — Telehealth: Payer: Self-pay | Admitting: Internal Medicine

## 2011-12-04 NOTE — Telephone Encounter (Signed)
Pt wants to know if she can take an iron injection instead of iron tablets.

## 2011-12-05 ENCOUNTER — Telehealth: Payer: Self-pay | Admitting: *Deleted

## 2011-12-05 NOTE — Telephone Encounter (Signed)
Pt had appointment with Dr.Tannenbaum last Friday for microscopic hematuria in urine, pt is calling to follow up if you have heard from Dr.Tannenbaum? Please advise

## 2011-12-05 NOTE — Telephone Encounter (Signed)
Left message for pt to call.

## 2011-12-05 NOTE — Telephone Encounter (Signed)
I spoke with him yesterday. I agreed that the pill he offered her would improve vaginal estrogen but because of the risk of blood clot or hemorrhage I. Would discouraged her from doing it. Tell her I think that the problem was not serious enough to take this risk.

## 2011-12-05 NOTE — Telephone Encounter (Signed)
Pt informed with the below. 

## 2011-12-09 ENCOUNTER — Telehealth: Payer: Self-pay | Admitting: Internal Medicine

## 2011-12-09 DIAGNOSIS — E039 Hypothyroidism, unspecified: Secondary | ICD-10-CM

## 2011-12-09 DIAGNOSIS — D649 Anemia, unspecified: Secondary | ICD-10-CM

## 2011-12-09 DIAGNOSIS — G609 Hereditary and idiopathic neuropathy, unspecified: Secondary | ICD-10-CM

## 2011-12-09 NOTE — Telephone Encounter (Signed)
Last  B12 August '12 - >1500. WE can order another. Iron saturation is definitely low. Will be working on IV iron infusion at Short Stay Happy to see her.

## 2011-12-09 NOTE — Telephone Encounter (Signed)
Pt wants to be worked in to discuss labs.  She thinks she is low on B-12 and folic acid.  To also discuss iron.  Her tongue is red, smooth and sore.

## 2011-12-10 NOTE — Telephone Encounter (Signed)
Pt made an appt to come for an office visit at 10:00 June 27, Thurs.

## 2011-12-12 ENCOUNTER — Encounter: Payer: Self-pay | Admitting: Internal Medicine

## 2011-12-12 ENCOUNTER — Ambulatory Visit (INDEPENDENT_AMBULATORY_CARE_PROVIDER_SITE_OTHER): Payer: Medicare Other | Admitting: Internal Medicine

## 2011-12-12 VITALS — BP 122/64 | HR 80 | Temp 97.3°F | Resp 16 | Wt 99.0 lb

## 2011-12-12 DIAGNOSIS — I10 Essential (primary) hypertension: Secondary | ICD-10-CM

## 2011-12-12 DIAGNOSIS — K589 Irritable bowel syndrome without diarrhea: Secondary | ICD-10-CM

## 2011-12-12 DIAGNOSIS — D649 Anemia, unspecified: Secondary | ICD-10-CM | POA: Diagnosis not present

## 2011-12-12 NOTE — Progress Notes (Signed)
Subjective:    Patient ID: [name] Small, female    DOB: 1916-07-26, 78 y.o.   MRN: [id number]  HPI Mrs. Burgueno has a mild anemia, at 11.7 g, with low iron saturation ratios. B12 August '12 - >1500, folate in '10 that was normal. She has continued to be tired with much less energy than usual. She has been intolerant of oral iron with constipation and abdominal pain. She is interested to have IV iron replacement.  She has recently seen Dr. Patsi Sears: she had cystoscopy that was unremarkable. She continues to have occasional spotting.   She reports continued weight loss despite a good appetite and by her report adequate calorie intake.   Past Medical History  Diagnosis Date  . Prolapsed urethral mucosa   . Osteoarthrosis, unspecified whether generalized or localized, pelvic region and thigh   . Full incontinence of feces   . Asymptomatic varicose veins   . Anemia, unspecified   . Idiopathic osteoporosis   . Melanoma of skin, site unspecified   . Gastroenteritis   . Iron deficiency anemia, unspecified   . Irritable bowel syndrome   . Internal hemorrhoids without mention of complication   . Carotid bruit     hx of  . Neuropathy     peripheral  . HTN (hypertension)   . Esophageal reflux   . Diverticulosis   . Dyslipidemia   . Spinal stenosis, unspecified region other than cervical   . Hypothyroidism   . Colon adenocarcinoma     hx of  . Fecal incontinence   . Melanoma   . MVP (mitral valve prolapse)   . Female bladder prolapse, acquired 07/17/2010  . Vaginal bleeding, abnormal   . Varicose veins   . Idiopathic osteoporosis   . Vaginitis, atrophic   . Fibroid   . Carotid bruit     doppler normal in the past  . Hemorrhoid   . OA (osteoarthritis)     left hip   Past Surgical History  Procedure Date  . Hemicolectomy     right  . Colonoscopy w/ polypectomy 2006    3 mm polyp destroyed, diverticulosis  . Esophagogastroduodenoscopy 2009    mild gastritis  .  Hemorrhoid surgery   . Hysteroscopy   . Dilation and curettage of uterus   . Melanoma excision    Family History  Problem Relation Age of Onset  . Stomach cancer Maternal Grandfather   . Cancer Maternal Grandfather     stomach  . Colon cancer Neg Hx   . Heart disease Father   . Diabetes Father    History   Social History  . Marital Status: Widowed    Spouse Name: N/A    Number of Children: 2  . Years of Education: N/A   Occupational History  . retired    Social History Main Topics  . Smoking status: Former Games developer  . Smokeless tobacco: Never Used  . Alcohol Use: 1.0 oz/week    2 drink(s) per week     occas wine 2 glasses a week  . Drug Use: No  . Sexually Active: No   Other Topics Concern  . Not on file   Social History Narrative   widowed after a very long and happy marriage almost 60 years. 2 sons - one a Nurse, learning disability, several grandchildren. Close and attentive family. Lives alone at [facility]: paricipates in yoga and exercise. I-ADLs including driving. Supportive family who checks on her and visits her regularly.End of Life Care:  She clearly states she does not want CPR (out of facility order signed November 26, 2010). Reviewed with her MOST - she would not want prolonged intensive care, mechanical ventilation or prolonged artificial fluids or nutrition. Provided unsigned MOST form for her to review.        Review of Systems System review is negative for any constitutional, cardiac, pulmonary, GI or neuro symptoms or complaints other than as described in the HPI.     Objective:   Physical Exam Filed Vitals:   12/12/11 1019  BP: 122/64  Pulse: 80  Temp: 97.3 F (36.3 C)  Resp: 16   Wt Readings from Last 3 Encounters:  12/12/11 99 lb (44.906 kg)  11/21/11 100 lb (45.36 kg)  10/23/11 102 lb (46.267 kg)    Gen'l - elderly white woman, thin, in no distress HEENT- dry mucus membranes, C&S clear Cor - RRR Pulm - normal respirations         Assessment & Plan:

## 2011-12-12 NOTE — Patient Instructions (Addendum)
Iron deficiency - last lab June 6th: Hemoglobin 11.7, B12 August '12 - >1500 (higher than normal), Iron saturation ratio June 6th 14% (20-50%).  Plan - out patient Iron infusion at the Short Stay Unit at [facility] - located at what used to be the ER on the north side of the hospital - Monday , July 1st - you should arrive at 08:30 AM. The infusion process takes about 2 hours.   OK to leave off gabapentin - if it is not helping the side effects are more important than the potential relief.

## 2011-12-13 NOTE — Assessment & Plan Note (Signed)
Doing better with small changes in diet and the use of Beano

## 2011-12-13 NOTE — Assessment & Plan Note (Signed)
Mild anemia with Hgb 11.7 but low iron saturation ratio of 14%  Plan - outpatient iron infusion July 1st [facility] - short stay

## 2011-12-13 NOTE — H&P (Signed)
[name] Small is an 78 y.o. female.   Chief Complaint: Iron deficiency anemia, intolerant PO iron HPI: [name] Small is a 78 y/o white woman with persistent mild iron deficiency anemia who is intolerant of oral iron. She is chronically weak and fatigued which may be due in part to her anemia. She is for Short Stay adm for IV iron infusion  Past Medical History  Diagnosis Date  . Prolapsed urethral mucosa   . Osteoarthrosis, unspecified whether generalized or localized, pelvic region and thigh   . Full incontinence of feces   . Asymptomatic varicose veins   . Anemia, unspecified   . Idiopathic osteoporosis   . Melanoma of skin, site unspecified   . Gastroenteritis   . Iron deficiency anemia, unspecified   . Irritable bowel syndrome   . Internal hemorrhoids without mention of complication   . Carotid bruit     hx of  . Neuropathy     peripheral  . HTN (hypertension)   . Esophageal reflux   . Diverticulosis   . Dyslipidemia   . Spinal stenosis, unspecified region other than cervical   . Hypothyroidism   . Colon adenocarcinoma     hx of  . Fecal incontinence   . Melanoma   . MVP (mitral valve prolapse)   . Female bladder prolapse, acquired 07/17/2010  . Vaginal bleeding, abnormal   . Varicose veins   . Idiopathic osteoporosis   . Vaginitis, atrophic   . Fibroid   . Carotid bruit     doppler normal in the past  . Hemorrhoid   . OA (osteoarthritis)     left hip    Past Surgical History  Procedure Date  . Hemicolectomy     right  . Colonoscopy w/ polypectomy 2006    3 mm polyp destroyed, diverticulosis  . Esophagogastroduodenoscopy 2009    mild gastritis  . Hemorrhoid surgery   . Hysteroscopy   . Dilation and curettage of uterus   . Melanoma excision     Family History  Problem Relation Age of Onset  . Stomach cancer Maternal Grandfather   . Cancer Maternal Grandfather     stomach  . Colon cancer Neg Hx   . Heart disease Father   . Diabetes Father     Social History:  reports that she has quit smoking. She has never used smokeless tobacco. She reports that she drinks about one ounce of alcohol per week. She reports that she does not use illicit drugs.  Allergies:  Allergies  Allergen Reactions  . Amoxil (Amoxicillin) Diarrhea  . Crab (Shellfish Allergy)     sensitivity  . Demerol   . Meperidine Hcl Other (See Comments)    Drop in blood pressure  . Neosporin (Neomycin-Bacitracin Zn-Polymyx)      (Not in a hospital admission)  No results found for this or any previous visit (from the past 48 hour(s)). No results found.  Review of Systems  Constitutional: Positive for weight loss and malaise/fatigue. Negative for fever and chills.  HENT: Negative.   Eyes: Negative.   Respiratory: Negative.   Gastrointestinal: Negative.   Genitourinary: Negative.   Musculoskeletal: Negative.   Skin: Negative.   Neurological: Negative.  Negative for weakness.  Endo/Heme/Allergies: Negative.   Psychiatric/Behavioral: Negative.     Blood pressure 122/64, pulse 80, temperature 97.3 F (36.3 C), temperature source Oral, resp. rate 16, weight 99 lb (44.906 kg). Physical Exam  Thin, elderly white woman seen July 27th in the office Cor-  RRR Pulm - normal respirations. Assessment/Plan Iron deficiency anemia - Plan - Infusion of 500 mg iron.   Illene Regulus 12/13/2011, 2:11 PM

## 2011-12-13 NOTE — Assessment & Plan Note (Signed)
BP Readings from Last 3 Encounters:  12/12/11 122/64  11/21/11 120/70  10/23/11 116/70

## 2011-12-13 NOTE — Addendum Note (Signed)
Addended by: Jacques Navy on: 12/13/2011 02:15 PM   Modules accepted: Orders

## 2011-12-16 ENCOUNTER — Encounter (HOSPITAL_COMMUNITY): Payer: Self-pay

## 2011-12-16 ENCOUNTER — Encounter (HOSPITAL_COMMUNITY)
Admission: RE | Admit: 2011-12-16 | Discharge: 2011-12-16 | Disposition: A | Discharge status: Home / Self Care | Payer: Medicare Other | Source: Ambulatory Visit | Attending: Internal Medicine | Admitting: Internal Medicine

## 2011-12-16 VITALS — BP 118/56 | HR 68 | Temp 98.1°F | Resp 18 | Ht <= 58 in | Wt 99.0 lb

## 2011-12-16 DIAGNOSIS — D649 Anemia, unspecified: Secondary | ICD-10-CM

## 2011-12-16 DIAGNOSIS — D509 Iron deficiency anemia, unspecified: Secondary | ICD-10-CM | POA: Diagnosis not present

## 2011-12-16 MED ORDER — SODIUM CHLORIDE 0.45 % IV SOLN: INTRAVENOUS | Status: DC

## 2011-12-16 MED ORDER — SODIUM CHLORIDE 0.9 % IV SOLN
25.0000 mg | Freq: Once | INTRAVENOUS | Status: AC
  Administered 2011-12-16: 25 mg via INTRAVENOUS
  Filled 2011-12-16: qty 0.5

## 2011-12-16 MED ORDER — SODIUM CHLORIDE 0.9 % IV SOLN
25.0000 mg | Freq: Once | INTRAVENOUS | Status: DC
  Filled 2011-12-16: qty 0.5

## 2011-12-16 MED ORDER — SODIUM CHLORIDE 0.9 % IV SOLN: 500.0000 mg | Freq: Once | INTRAVENOUS | Status: DC

## 2011-12-16 MED ORDER — SODIUM CHLORIDE 0.9 % IV SOLN
500.0000 mg | Freq: Once | INTRAVENOUS | Status: DC
  Filled 2011-12-16: qty 10

## 2011-12-16 NOTE — Progress Notes (Signed)
redness completely gone. Pt states indigestion has resolved.

## 2011-12-16 NOTE — Progress Notes (Signed)
infed test dose given, as last about 5cc going in, patient became  Very red, face scalp, arms and she could feel she was becoming red and flushed.  Infusion stopped and call placed to dr norrins.  Order given to discontinue infed and office will contact patient (per sue,RN) at dr norrins office.

## 2011-12-16 NOTE — Progress Notes (Signed)
Spoke to dr norrins re : pt c/o chest tightness and Indigestion.  Per md, watch patient for another 15 minutes. If no changes, pt may be discharged home

## 2011-12-16 NOTE — Discharge Instructions (Signed)

## 2011-12-20 ENCOUNTER — Other Ambulatory Visit: Payer: Self-pay | Admitting: Internal Medicine

## 2011-12-23 ENCOUNTER — Telehealth: Payer: Self-pay | Admitting: *Deleted

## 2011-12-23 DIAGNOSIS — D649 Anemia, unspecified: Secondary | ICD-10-CM

## 2011-12-23 MED ORDER — LEVOTHYROXINE SODIUM 75 MCG PO TABS: 75.0000 ug | ORAL_TABLET | Freq: Every day | ORAL | Status: DC

## 2011-12-23 NOTE — Telephone Encounter (Signed)
Patient called and stated the redness and brusing has almost gone away. And is eating cereals with iron in it. She wanted you to know this

## 2011-12-23 NOTE — Telephone Encounter (Signed)
Patient notified of need to repeat lab work in 10 days

## 2011-12-23 NOTE — Telephone Encounter (Signed)
Good news appreciated. Will have repeat lab done to check hemoglobin and iron stores in 10 days. Orders entered

## 2011-12-24 ENCOUNTER — Ambulatory Visit (INDEPENDENT_AMBULATORY_CARE_PROVIDER_SITE_OTHER): Payer: Medicare Other | Admitting: Obstetrics and Gynecology

## 2011-12-24 DIAGNOSIS — N950 Postmenopausal bleeding: Secondary | ICD-10-CM

## 2011-12-24 NOTE — Progress Notes (Signed)
Patient came to see me today with an 8 day history of intermittent vaginal bleeding. It is dark red. It seems to come and go. It is not related to cramping. The patient is on nothing hormonal and is not using her pessary anymore. She has been told by her PCP that she has iron deficient anemia and is on iron.  Pelvic exam: External: Within normal limits. BUS within normal limits except for small urethrocele with some irritation.  Vaginal exam: atrophic vaginitis without lesion and second-degree cystocele. Cervix: Clean. Uterus: Within normal limits. Adnexa: Within normal limits. Rectovaginal exam: Within normal limits.   Assessment: Postmenopausal bleeding  Plan: Pelvic ultrasound

## 2011-12-26 ENCOUNTER — Ambulatory Visit (INDEPENDENT_AMBULATORY_CARE_PROVIDER_SITE_OTHER): Payer: Medicare Other

## 2011-12-26 ENCOUNTER — Ambulatory Visit (INDEPENDENT_AMBULATORY_CARE_PROVIDER_SITE_OTHER): Payer: Medicare Other | Admitting: Obstetrics and Gynecology

## 2011-12-26 DIAGNOSIS — D3910 Neoplasm of uncertain behavior of unspecified ovary: Secondary | ICD-10-CM

## 2011-12-26 DIAGNOSIS — N950 Postmenopausal bleeding: Secondary | ICD-10-CM

## 2011-12-26 DIAGNOSIS — D259 Leiomyoma of uterus, unspecified: Secondary | ICD-10-CM | POA: Diagnosis not present

## 2011-12-26 DIAGNOSIS — D219 Benign neoplasm of connective and other soft tissue, unspecified: Secondary | ICD-10-CM

## 2011-12-26 NOTE — Progress Notes (Signed)
Patient came back today for a pelvic ultrasound because of postmenopausal bleeding. She has been found by her PCP to be iron deficient and is being treated. She is also had rectal bleeding from her hemorrhoids and is under the care of her gastroenterologist. On ultrasound today her uterus is normal except for small fibroid of 1.1 cm and 4 mm. There is a sliver of fluid in her endometrial cavity without any evidence of a lesion. The endometrial cavity itself is thin an echogenic. The thickness is 1.2 mm. Her right ovary shows a calcification which is stable from previous ultrasounds. It is negative for color-flow Doppler. Her left ovary could not be imaged but there is no mass on the left side. Her cul-de-sac is free of fluid.  Assessment: Postmenopausal bleeding with normal ultrasound  Plan: Patient reassured. She will report new bleeding to me.

## 2012-01-02 ENCOUNTER — Other Ambulatory Visit (INDEPENDENT_AMBULATORY_CARE_PROVIDER_SITE_OTHER): Payer: Medicare Other

## 2012-01-02 DIAGNOSIS — D649 Anemia, unspecified: Secondary | ICD-10-CM

## 2012-01-02 DIAGNOSIS — G609 Hereditary and idiopathic neuropathy, unspecified: Secondary | ICD-10-CM | POA: Diagnosis not present

## 2012-01-02 DIAGNOSIS — E039 Hypothyroidism, unspecified: Secondary | ICD-10-CM | POA: Diagnosis not present

## 2012-01-02 LAB — IBC PANEL
Iron: 58 ug/dL (ref 42–145)
Saturation Ratios: 12.2 % — ABNORMAL LOW (ref 20.0–50.0)

## 2012-01-02 LAB — COMPREHENSIVE METABOLIC PANEL
Alkaline Phosphatase: 78 U/L (ref 39–117)
CO2: 30 mEq/L (ref 19–32)
Creatinine, Ser: 0.7 mg/dL (ref 0.4–1.2)
GFR: 80.38 mL/min (ref >60.00)
Glucose, Bld: 100 mg/dL — ABNORMAL HIGH (ref 70–99)
Sodium: 140 mEq/L (ref 135–145)
Total Bilirubin: 0.7 mg/dL (ref 0.3–1.2)

## 2012-01-02 LAB — CBC WITH DIFFERENTIAL/PLATELET
Basophils Absolute: 0 10*3/uL (ref 0.0–0.1)
Basophils Relative: 0.5 % (ref 0.0–3.0)
Eosinophils Absolute: 0.1 10*3/uL (ref 0.0–0.7)
Lymphocytes Relative: 23.3 % (ref 12.0–46.0)
MCHC: 32.4 g/dL (ref 30.0–36.0)
MCV: 95.1 fl (ref 78.0–100.0)
Monocytes Absolute: 0.4 10*3/uL (ref 0.1–1.0)
Neutro Abs: 3.5 10*3/uL (ref 1.4–7.7)
Neutrophils Relative %: 66.3 % (ref 43.0–77.0)
RBC: 4.09 Mil/uL (ref 3.87–5.11)
RDW: 14.2 % (ref 11.5–14.6)

## 2012-01-02 LAB — VITAMIN B12: Vitamin B-12: 1199 pg/mL — ABNORMAL HIGH (ref 211–911)

## 2012-01-06 ENCOUNTER — Encounter: Payer: Self-pay | Admitting: Internal Medicine

## 2012-01-20 DIAGNOSIS — H903 Sensorineural hearing loss, bilateral: Secondary | ICD-10-CM | POA: Diagnosis not present

## 2012-01-22 ENCOUNTER — Other Ambulatory Visit: Payer: Self-pay | Admitting: Internal Medicine

## 2012-02-01 ENCOUNTER — Other Ambulatory Visit: Payer: Self-pay | Admitting: Internal Medicine

## 2012-02-03 ENCOUNTER — Other Ambulatory Visit: Payer: Self-pay | Admitting: *Deleted

## 2012-02-03 MED ORDER — NYSTATIN-TRIAMCINOLONE 100000-0.1 UNIT/GM-% EX CREA: 1.0000 "application " | TOPICAL_CREAM | Freq: Two times a day (BID) | CUTANEOUS | Status: DC

## 2012-02-11 ENCOUNTER — Other Ambulatory Visit: Payer: Self-pay | Admitting: *Deleted

## 2012-02-11 DIAGNOSIS — M810 Age-related osteoporosis without current pathological fracture: Secondary | ICD-10-CM

## 2012-02-19 ENCOUNTER — Ambulatory Visit: Payer: Medicare Other | Admitting: Cardiology

## 2012-02-24 ENCOUNTER — Encounter: Payer: Self-pay | Admitting: Cardiology

## 2012-02-24 ENCOUNTER — Ambulatory Visit (INDEPENDENT_AMBULATORY_CARE_PROVIDER_SITE_OTHER): Payer: Medicare Other | Admitting: Cardiology

## 2012-02-24 VITALS — BP 148/75 | HR 82 | Wt 100.0 lb

## 2012-02-24 DIAGNOSIS — N939 Abnormal uterine and vaginal bleeding, unspecified: Secondary | ICD-10-CM | POA: Diagnosis not present

## 2012-02-24 DIAGNOSIS — N926 Irregular menstruation, unspecified: Secondary | ICD-10-CM

## 2012-02-24 DIAGNOSIS — Z85828 Personal history of other malignant neoplasm of skin: Secondary | ICD-10-CM | POA: Diagnosis not present

## 2012-02-24 DIAGNOSIS — I10 Essential (primary) hypertension: Secondary | ICD-10-CM

## 2012-02-24 DIAGNOSIS — L821 Other seborrheic keratosis: Secondary | ICD-10-CM | POA: Diagnosis not present

## 2012-02-24 DIAGNOSIS — Z85820 Personal history of malignant melanoma of skin: Secondary | ICD-10-CM | POA: Diagnosis not present

## 2012-02-24 DIAGNOSIS — L819 Disorder of pigmentation, unspecified: Secondary | ICD-10-CM | POA: Diagnosis not present

## 2012-02-24 NOTE — Patient Instructions (Addendum)
Your physician wants you to follow-up in: 1 year. You will receive a reminder letter in the mail two months in advance. If you don't receive a letter, please call our office to schedule the follow-up appointment.  

## 2012-02-24 NOTE — Progress Notes (Signed)
HPI   Patient is 78 years old. She is actually doing very well. She does have some chronic bleeding that is either vaginal or GI. It is followed by her other doctors. She is no longer on aspirin. She has no cardiac symptoms.  Allergies  Allergen Reactions  . Amoxil (Amoxicillin) Diarrhea  . Crab (Shellfish Allergy)     sensitivity  . Demerol   . Meperidine Hcl Other (See Comments)    Drop in blood pressure  . Neosporin (Neomycin-Bacitracin Zn-Polymyx)   . Infed (Iron Dextran) Rash    Became very red over face, scalp, and arms    Current Outpatient Prescriptions  Medication Sig Dispense Refill  . Alpha-D-Galactosidase (BEANO PO) Take 2 tablets by mouth every morning before fiber intake and every evening      . ALPRAZolam (XANAX) 0.25 MG tablet Take 1 tablet (0.25 mg total) by mouth every 6 (six) hours as needed for anxiety.  100 tablet  2  . calcium carbonate (OS-CAL) 600 MG TABS Take 600 mg by mouth daily.        . calcium carbonate (TUMS) 500 MG chewable tablet Chew 1 tablet by mouth as needed.       . Calcium Citrate (CITRACAL PO) Take one to two tablespoon daily      . fluticasone (FLONASE) 50 MCG/ACT nasal spray TAKE AS NEEDED  16 g  2  . Lactobacillus (REPHRESH PRO-B) CAPS Take 1 by mouth every day       . levothyroxine (SYNTHROID, LEVOTHROID) 75 MCG tablet TAKE 1 TABLET (75 MCG TOTAL)  BY MOUTH DAILY.  30 tablet  10  . Multiple Vitamin (MULTIVITAMIN) tablet Take 1 tablet by mouth daily.        Marland Kitchen nystatin-triamcinolone (MYCOLOG II) cream Apply 1 application topically 2 (two) times daily. As directed as needed  30 g  0  . OVER THE COUNTER MEDICATION Take by mouth 2 (two) times daily. Supreme Doliphus.      . polyethylene glycol powder (MIRALAX) powder Take 17 g by mouth daily.        . Probiotic Product (ALIGN) 4 MG CAPS Take 1 tablet by mouth daily.        Marland Kitchen PROCTOCREAM-HC 2.5 % rectal cream APPLY TO RECTUM TWICE A DAY AS NEEDED.  30 g  3  . terconazole (TERAZOL 3) 0.8 %  vaginal cream Insert 1 applicator in vagina for 3 nights and apply outside twice a day as needed for external itching.  20 g  0  . zolpidem (AMBIEN) 5 MG tablet Take 1 tablet (5 mg total) by mouth at bedtime as needed.  30 tablet  0    History   Social History  . Marital Status: Widowed    Spouse Name: N/A    Number of Children: 2  . Years of Education: N/A   Occupational History  . retired    Social History Main Topics  . Smoking status: Former Games developer  . Smokeless tobacco: Never Used  . Alcohol Use: 1.0 oz/week    2 drink(s) per week     occas wine 2 glasses a week  . Drug Use: No  . Sexually Active: No   Other Topics Concern  . Not on file   Social History Narrative   widowed after a very long and happy marriage almost 60 years. 2 sons - one a Nurse, learning disability, several grandchildren. Close and attentive family. Lives alone at [facility]: paricipates in yoga and exercise. I-ADLs  including driving. Supportive family who checks on her and visits her regularly.End of Life Care: She clearly states she does not want CPR (out of facility order signed November 26, 2010). Reviewed with her MOST - she would not want prolonged intensive care, mechanical ventilation or prolonged artificial fluids or nutrition. Provided unsigned MOST form for her to review.     Family History  Problem Relation Age of Onset  . Stomach cancer Maternal Grandfather   . Cancer Maternal Grandfather     stomach  . Colon cancer Neg Hx   . Heart disease Father   . Diabetes Father     Past Medical History  Diagnosis Date  . Prolapsed urethral mucosa   . Osteoarthrosis, unspecified whether generalized or localized, pelvic region and thigh   . Full incontinence of feces   . Asymptomatic varicose veins   . Anemia, unspecified   . Idiopathic osteoporosis   . Melanoma of skin, site unspecified   . Gastroenteritis   . Iron deficiency anemia, unspecified   . Irritable bowel syndrome   . Internal  hemorrhoids without mention of complication   . Carotid bruit     hx of  . Neuropathy     peripheral  . HTN (hypertension)   . Esophageal reflux   . Diverticulosis   . Dyslipidemia   . Spinal stenosis, unspecified region other than cervical   . Hypothyroidism   . Colon adenocarcinoma     hx of  . Fecal incontinence   . Melanoma   . MVP (mitral valve prolapse)   . Female bladder prolapse, acquired 07/17/2010  . Vaginal bleeding, abnormal   . Varicose veins   . Idiopathic osteoporosis   . Vaginitis, atrophic   . Fibroid   . Carotid bruit     doppler normal in the past  . Hemorrhoid   . OA (osteoarthritis)     left hip    Past Surgical History  Procedure Date  . Hemicolectomy     right  . Colonoscopy w/ polypectomy 2006    3 mm polyp destroyed, diverticulosis  . Esophagogastroduodenoscopy 2009    mild gastritis  . Hemorrhoid surgery   . Hysteroscopy   . Dilation and curettage of uterus   . Melanoma excision     ROS   Patient denies fever, chills, headache, sweats, rash, change in vision, change in hearing, chest pain, cough, nausea vomiting,. All other systems are reviewed and are negative other than the history of present illness.  PHYSICAL EXAM  The patient is spry. We talked about the fact that she hopes to be stable for the birth of her great-grandchild in November. There is no jugulovenous distention. Lungs are clear. Respiratory effort is nonlabored. Cardiac exam reveals S1 and S2. There are no clicks or significant murmurs. The abdomen is soft. Is no peripheral edema.  Filed Vitals:   02/24/12 1113  BP: 148/75  Pulse: 82  Weight: 100 lb (45.36 kg)   EKG is done today and reviewed by me. She has normal sinus rhythm. There are no significant abnormalities.  ASSESSMENT & PLAN

## 2012-02-24 NOTE — Assessment & Plan Note (Signed)
Blood pressure is stable. No change in therapy. 

## 2012-02-24 NOTE — Assessment & Plan Note (Signed)
The patient has been taken off her aspirin I certainly agree. There has never been proven coronary disease.

## 2012-03-10 DIAGNOSIS — M810 Age-related osteoporosis without current pathological fracture: Secondary | ICD-10-CM | POA: Diagnosis not present

## 2012-03-11 ENCOUNTER — Other Ambulatory Visit: Payer: Self-pay | Admitting: Obstetrics and Gynecology

## 2012-03-11 DIAGNOSIS — M810 Age-related osteoporosis without current pathological fracture: Secondary | ICD-10-CM

## 2012-03-16 ENCOUNTER — Ambulatory Visit: Payer: Medicare Other | Admitting: Cardiology

## 2012-03-17 ENCOUNTER — Ambulatory Visit (INDEPENDENT_AMBULATORY_CARE_PROVIDER_SITE_OTHER): Payer: Medicare Other | Admitting: Obstetrics and Gynecology

## 2012-03-17 ENCOUNTER — Ambulatory Visit: Payer: Medicare Other | Admitting: Obstetrics and Gynecology

## 2012-03-17 DIAGNOSIS — M810 Age-related osteoporosis without current pathological fracture: Secondary | ICD-10-CM | POA: Diagnosis not present

## 2012-03-17 NOTE — Progress Notes (Signed)
Patient came to see me today with her son to discuss her bone loss. She has osteoporosis on bone density with her worst T score of -3 in her wrist. She has lost 10.4% bone in her left hip since her last bone density 2 years ago. She has not had a fracture. She takes calcium with vitamin D twice a day but frequently skips a second dose. She does try 2  Exercise. She took Evista for many years. When she started to lose bone on Evista she was switched to Fosamax which she could not tolerate. She was then switched to Actonel which she took from 2008-2012.  We had a 30 minute discussion about her significant risk of a fracture especially at 78 years old. Blood was drawn for secondary causes of bone loss. I think the ideal drug for her is Prolia and we had a verbal discussion of it including side effects. Printed information was given her. We will call her with her lab results pending get her approved.

## 2012-03-17 NOTE — Patient Instructions (Signed)
We will call with lab results   

## 2012-03-18 ENCOUNTER — Telehealth: Payer: Self-pay

## 2012-03-18 NOTE — Telephone Encounter (Signed)
Pt advised and will inform prescribing MD.

## 2012-03-18 NOTE — Telephone Encounter (Signed)
The benefit of prolia, or any bone enhancing medication is somewhat questionable.  Prolia by-passes the GI track and there should not be an absorption problem. Happy to talk with her about this at a ov

## 2012-03-18 NOTE — Telephone Encounter (Signed)
Pt called stating another of her physicians suggested she consider Prolia but inserts states that "individuals with problems absorbing minerals should not take". Pt says she has a condition which she "cannot absorb fats" and she is requesting MEN's advisement regarding this before starting medication. Please advise.

## 2012-03-19 ENCOUNTER — Telehealth: Payer: Self-pay | Admitting: *Deleted

## 2012-03-19 NOTE — Telephone Encounter (Signed)
Pt was called with Prolia benefits. I left a message for the patient. Her insurance covers the Prolia injection 100 % once her Medicare Deductible has been met. The Medicare deductible is $147 and the patient should have probably met her deductible by now. SHe can schedule an apt for the injection. KW

## 2012-03-19 NOTE — Telephone Encounter (Signed)
Pt informed of benefits and will call me back to lmk if wants to proceed with injection. KW

## 2012-03-26 DIAGNOSIS — R3911 Hesitancy of micturition: Secondary | ICD-10-CM | POA: Diagnosis not present

## 2012-03-26 DIAGNOSIS — R82998 Other abnormal findings in urine: Secondary | ICD-10-CM | POA: Diagnosis not present

## 2012-03-26 NOTE — Telephone Encounter (Signed)
Pt called me to discuss further the Prolia. She is not sure about it. She asks about getting a second opinion regarding the Prolia. She asks because of her age. Please advise

## 2012-03-26 NOTE — Telephone Encounter (Signed)
Have her talk to her primary care physician.

## 2012-03-30 DIAGNOSIS — L821 Other seborrheic keratosis: Secondary | ICD-10-CM | POA: Diagnosis not present

## 2012-03-31 DIAGNOSIS — Z23 Encounter for immunization: Secondary | ICD-10-CM | POA: Diagnosis not present

## 2012-03-31 NOTE — Telephone Encounter (Signed)
Pt informed and will call Dr Debby Bud and let me know.

## 2012-04-07 ENCOUNTER — Encounter: Payer: Self-pay | Admitting: Internal Medicine

## 2012-04-07 ENCOUNTER — Ambulatory Visit (INDEPENDENT_AMBULATORY_CARE_PROVIDER_SITE_OTHER): Payer: Medicare Other | Admitting: Internal Medicine

## 2012-04-07 VITALS — BP 118/62 | HR 87 | Temp 98.0°F | Resp 16 | Wt 101.0 lb

## 2012-04-07 DIAGNOSIS — S8990XA Unspecified injury of unspecified lower leg, initial encounter: Secondary | ICD-10-CM | POA: Diagnosis not present

## 2012-04-07 DIAGNOSIS — M818 Other osteoporosis without current pathological fracture: Secondary | ICD-10-CM | POA: Diagnosis not present

## 2012-04-07 DIAGNOSIS — S99929A Unspecified injury of unspecified foot, initial encounter: Secondary | ICD-10-CM

## 2012-04-07 DIAGNOSIS — S8991XA Unspecified injury of right lower leg, initial encounter: Secondary | ICD-10-CM

## 2012-04-07 DIAGNOSIS — I10 Essential (primary) hypertension: Secondary | ICD-10-CM

## 2012-04-07 DIAGNOSIS — K219 Gastro-esophageal reflux disease without esophagitis: Secondary | ICD-10-CM

## 2012-04-07 NOTE — Progress Notes (Signed)
  Subjective:    Patient ID: [name] Small, female    DOB: Dec 23, 1916, 78 y.o.   MRN: [id number]  HPI Mrs Laino has several questions:  1. Bone density results and need for  treatment - question of using Prolia 2. Left knee pain after fall 3. Mild dysphagia 4. Reflux disease  PMH, FamHx and SocHx reviewed for any changes and relevance.  Current Outpatient Prescriptions on File Prior to Visit  Medication Sig Dispense Refill  . Alpha-D-Galactosidase (BEANO PO) Take 2 tablets by mouth every morning before fiber intake and every evening      . ALPRAZolam (XANAX) 0.25 MG tablet Take 1 tablet (0.25 mg total) by mouth every 6 (six) hours as needed for anxiety.  100 tablet  2  . Calcium Citrate (CITRACAL PO) Take one to two tablespoon daily      . fluticasone (FLONASE) 50 MCG/ACT nasal spray TAKE AS NEEDED  16 g  2  . Lactobacillus (REPHRESH PRO-B) CAPS Take 1 by mouth every day       . levothyroxine (SYNTHROID, LEVOTHROID) 75 MCG tablet TAKE 1 TABLET (75 MCG TOTAL)  BY MOUTH DAILY.  30 tablet  10  . Multiple Vitamin (MULTIVITAMIN) tablet Take 1 tablet by mouth daily.        Marland Kitchen nystatin-triamcinolone (MYCOLOG II) cream Apply 1 application topically 2 (two) times daily. As directed as needed  30 g  0  . OVER THE COUNTER MEDICATION Take by mouth 2 (two) times daily. Supreme Doliphus.      . polyethylene glycol powder (MIRALAX) powder Take 17 g by mouth daily.        . Probiotic Product (ALIGN) 4 MG CAPS Take 1 tablet by mouth daily.        Marland Kitchen PROCTOCREAM-HC 2.5 % rectal cream APPLY TO RECTUM TWICE A DAY AS NEEDED.  30 g  3  . zolpidem (AMBIEN) 5 MG tablet Take 1 tablet (5 mg total) by mouth at bedtime as needed.  30 tablet  0  . calcium carbonate (OS-CAL) 600 MG TABS Take 600 mg by mouth daily.        . calcium carbonate (TUMS) 500 MG chewable tablet Chew 1 tablet by mouth as needed.       Marland Kitchen terconazole (TERAZOL 3) 0.8 % vaginal cream Insert 1 applicator in vagina for 3 nights and apply  outside twice a day as needed for external itching.  20 g  0       Review of Systems System review is negative for any constitutional, cardiac, pulmonary, GI or neuro symptoms or complaints other than as described in the HPI.     Objective:   Physical Exam Filed Vitals:   04/07/12 1416  BP: 118/62  Pulse: 87  Temp: 98 F (36.7 C)  Resp: 16   Wt Readings from Last 3 Encounters:  04/07/12 101 lb (45.813 kg)  02/24/12 100 lb (45.36 kg)  12/16/11 99 lb (44.906 kg)   Gen'l- an elderly spry woman in no distress Cor- RRR Pulm - normal respirations Neuro - A&O x 3, cognitively intact, ambulates w/o use of assist - a broad based gait.       Assessment & Plan:  Mild dysphagia - likely to have mild swallowing trouble. Plan  Double swallow  Mild chin tuck.

## 2012-04-07 NOTE — Patient Instructions (Addendum)
Bone Density - using the T-Score: the more negative the number the more bone less there is and the increased risk of fracture.      Radius T-score Neck of the Hip T-score Total Hip T-score '09            -3.0   -2.1        -1.9 '13         -3.0   -1.3        -1.4  This shows improved bone density 2009 to 2013. Fracture risk is the same or less. We have no evidence that any treatment will make a meaningful difference in bone density or risk fracture in a person in their 90's. In addition, all treatments have potential side effect. Therefore, I do not recommend any treatment at this time other than adequate calcium (1200mg  daily: diet plus supplement) and vitamin D 800-1,000 iu daily and weight bearing exercise, i.e. Walking. The main issue is to avoid falls.   Reviewed knee x-rays: no definite fracture of the knee. Mild chondrocalcinosis - calcification of the cartilage  Mild dysphagia - likely to have mild swallowing trouble. Plan  Double swallow  Mild chin tuck.   For reflux and hoarseness - it is likely that acid reflux is affecting your voice. Plan - use Zantac, Pepcid, tagamet as needed - very safe.   Completed treatment of bladder infection.

## 2012-04-08 NOTE — Assessment & Plan Note (Signed)
Bone Density - using the T-Score: the more negative the number the more bone less there is and the increased risk of fracture.      Radius T-score Neck of the Hip T-score Total Hip T-score '09            -3.0   -2.1        -1.9 '13         -3.0   -1.3        -1.4  This shows improved bone density 2009 to 2013. Fracture risk is the same or less. We have no evidence that any treatment will make a meaningful difference in bone density or risk fracture in a person in their 90's. In addition, all treatments have potential side effect. Therefore, I do not recommend any treatment at this time other than adequate calcium (1200mg  daily: diet plus supplement) and vitamin D 800-1,000 iu daily and weight bearing exercise, i.e. Walking. The main issue is to avoid falls.

## 2012-04-08 NOTE — Assessment & Plan Note (Signed)
For reflux and hoarseness - it is likely that acid reflux is affecting your voice. Plan - use Zantac, Pepcid, tagamet as needed - very safe.

## 2012-04-08 NOTE — Assessment & Plan Note (Signed)
Still with occasional pain both knees and mild fluid build up more on the left.

## 2012-04-08 NOTE — Assessment & Plan Note (Signed)
BP Readings from Last 3 Encounters:  04/07/12 118/62  02/24/12 148/75  12/16/11 118/56   Generally very normal blood pressure. No need for any medical intervention

## 2012-04-11 ENCOUNTER — Other Ambulatory Visit: Payer: Self-pay | Admitting: Internal Medicine

## 2012-04-15 ENCOUNTER — Telehealth: Payer: Self-pay | Admitting: Internal Medicine

## 2012-04-15 NOTE — Telephone Encounter (Signed)
Patient has had a return of her rectal bleeding from hemorrhoids.  She is advised that she needs to resume using the suppositories nightly for at least 1 week.  Then PRN.  She is also asked to use the proctofoam cream PRN.  She will call back for any continued problems

## 2012-04-16 DIAGNOSIS — N8110 Cystocele, unspecified: Secondary | ICD-10-CM | POA: Diagnosis not present

## 2012-04-16 DIAGNOSIS — M6281 Muscle weakness (generalized): Secondary | ICD-10-CM | POA: Diagnosis not present

## 2012-04-16 DIAGNOSIS — N3941 Urge incontinence: Secondary | ICD-10-CM | POA: Diagnosis not present

## 2012-04-16 DIAGNOSIS — R279 Unspecified lack of coordination: Secondary | ICD-10-CM | POA: Diagnosis not present

## 2012-04-17 ENCOUNTER — Ambulatory Visit: Payer: Medicare Other | Admitting: Gynecology

## 2012-04-20 ENCOUNTER — Ambulatory Visit: Payer: Medicare Other | Admitting: Gynecology

## 2012-04-21 ENCOUNTER — Ambulatory Visit (INDEPENDENT_AMBULATORY_CARE_PROVIDER_SITE_OTHER): Payer: Medicare Other | Admitting: Gynecology

## 2012-04-21 ENCOUNTER — Encounter: Payer: Self-pay | Admitting: Gynecology

## 2012-04-21 VITALS — BP 110/70

## 2012-04-21 DIAGNOSIS — N898 Other specified noninflammatory disorders of vagina: Secondary | ICD-10-CM | POA: Diagnosis not present

## 2012-04-21 DIAGNOSIS — L259 Unspecified contact dermatitis, unspecified cause: Secondary | ICD-10-CM

## 2012-04-21 DIAGNOSIS — R823 Hemoglobinuria: Secondary | ICD-10-CM | POA: Diagnosis not present

## 2012-04-21 LAB — URINALYSIS W MICROSCOPIC + REFLEX CULTURE
Casts: NONE SEEN
Glucose, UA: NEGATIVE mg/dL
pH: 6 (ref 5.0–8.0)

## 2012-04-21 LAB — WET PREP FOR TRICH, YEAST, CLUE: Clue Cells Wet Prep HPF POC: NONE SEEN

## 2012-04-21 MED ORDER — HYDROCORTISONE VALERATE 0.2 % EX OINT: TOPICAL_OINTMENT | Freq: Two times a day (BID) | CUTANEOUS | Status: DC

## 2012-04-21 NOTE — Addendum Note (Signed)
Addended by: Bertram Savin A on: 04/21/2012 11:55 AM   Modules accepted: Orders

## 2012-04-21 NOTE — Progress Notes (Signed)
78 year old patient who presented to the office today complaining of vaginal/volar dictation and questionable spotting. Patient with known history of a second-degree cystocele and small urethrocele. Patient on and off has applied different types of creams externally and was concerned and wanted to be check today.  Exam: Excoriated areas the external labia majora and perineum possibly from urine causing a contact dermatitis for/irritation. Vagina: Second-degree cystocele small urethrocele well-lubricated although some atrophy is still present.  Wet prep some bacteria and white blood cells but no evidence of yeast or BV  Urinalysis 0-2 WBC, 3-6 RBC rare bacteria  Assessment/plan: Contact dermatitis perhaps irritation from urine and soiling? She'll be placed on Westcort cream 0.2% to apply twice a day for 7-10 days. I've recommended that s after that she apply Vaseline to the external genitalia perineum and perirectal area at least twice a day to act as a barrier for future irritation.

## 2012-04-21 NOTE — Patient Instructions (Addendum)

## 2012-04-23 ENCOUNTER — Telehealth: Payer: Self-pay | Admitting: *Deleted

## 2012-04-23 LAB — URINE CULTURE: Colony Count: 9000

## 2012-04-23 NOTE — Telephone Encounter (Signed)
Message copied by Aura Camps on Thu Apr 23, 2012 10:58 AM ------      Message from: Leanor Kail      Created: Thu Apr 23, 2012 10:43 AM       Victorino Dike,            Patient called into stating that Dr. Lily Peer didn't call in her West-Court cream into [facility].  Could you call this in for her please?  She was seen on 04/21/12.            Thanks

## 2012-04-23 NOTE — Telephone Encounter (Signed)
rx called in to [facility]. 

## 2012-04-29 DIAGNOSIS — H6120 Impacted cerumen, unspecified ear: Secondary | ICD-10-CM | POA: Diagnosis not present

## 2012-04-29 DIAGNOSIS — H903 Sensorineural hearing loss, bilateral: Secondary | ICD-10-CM | POA: Diagnosis not present

## 2012-05-04 DIAGNOSIS — H02109 Unspecified ectropion of unspecified eye, unspecified eyelid: Secondary | ICD-10-CM | POA: Diagnosis not present

## 2012-05-04 DIAGNOSIS — H01009 Unspecified blepharitis unspecified eye, unspecified eyelid: Secondary | ICD-10-CM | POA: Diagnosis not present

## 2012-05-20 DIAGNOSIS — H02109 Unspecified ectropion of unspecified eye, unspecified eyelid: Secondary | ICD-10-CM | POA: Diagnosis not present

## 2012-05-20 DIAGNOSIS — H353190 Nonexudative age-related macular degeneration, unspecified eye, stage unspecified: Secondary | ICD-10-CM | POA: Diagnosis not present

## 2012-05-20 DIAGNOSIS — H3530 Unspecified macular degeneration: Secondary | ICD-10-CM | POA: Diagnosis not present

## 2012-05-25 DIAGNOSIS — R339 Retention of urine, unspecified: Secondary | ICD-10-CM | POA: Diagnosis not present

## 2012-05-25 DIAGNOSIS — R300 Dysuria: Secondary | ICD-10-CM | POA: Diagnosis not present

## 2012-06-15 DIAGNOSIS — H903 Sensorineural hearing loss, bilateral: Secondary | ICD-10-CM | POA: Diagnosis not present

## 2012-06-15 DIAGNOSIS — H6120 Impacted cerumen, unspecified ear: Secondary | ICD-10-CM | POA: Diagnosis not present

## 2012-06-19 ENCOUNTER — Other Ambulatory Visit: Payer: Self-pay | Admitting: Internal Medicine

## 2012-06-19 DIAGNOSIS — R339 Retention of urine, unspecified: Secondary | ICD-10-CM | POA: Diagnosis not present

## 2012-06-19 MED ORDER — ALPRAZOLAM 0.25 MG PO TABS: 0.2500 mg | ORAL_TABLET | Freq: Four times a day (QID) | ORAL | Status: DC | PRN

## 2012-06-24 DIAGNOSIS — M6281 Muscle weakness (generalized): Secondary | ICD-10-CM | POA: Diagnosis not present

## 2012-06-24 DIAGNOSIS — R339 Retention of urine, unspecified: Secondary | ICD-10-CM | POA: Diagnosis not present

## 2012-06-24 DIAGNOSIS — M629 Disorder of muscle, unspecified: Secondary | ICD-10-CM | POA: Diagnosis not present

## 2012-06-24 DIAGNOSIS — R3911 Hesitancy of micturition: Secondary | ICD-10-CM | POA: Diagnosis not present

## 2012-06-26 ENCOUNTER — Encounter: Payer: Self-pay | Admitting: Gynecology

## 2012-06-26 ENCOUNTER — Ambulatory Visit (INDEPENDENT_AMBULATORY_CARE_PROVIDER_SITE_OTHER): Payer: Medicare Other | Admitting: Gynecology

## 2012-06-26 VITALS — BP 126/82

## 2012-06-26 DIAGNOSIS — N898 Other specified noninflammatory disorders of vagina: Secondary | ICD-10-CM

## 2012-06-26 DIAGNOSIS — L259 Unspecified contact dermatitis, unspecified cause: Secondary | ICD-10-CM

## 2012-06-26 DIAGNOSIS — L309 Dermatitis, unspecified: Secondary | ICD-10-CM

## 2012-06-26 LAB — WET PREP FOR TRICH, YEAST, CLUE
Clue Cells Wet Prep HPF POC: NONE SEEN
Trich, Wet Prep: NONE SEEN
Yeast Wet Prep HPF POC: NONE SEEN

## 2012-06-26 MED ORDER — CLINDAMYCIN PHOSPHATE 2 % VA CREA: 1.0000 | TOPICAL_CREAM | Freq: Every day | VAGINAL | Status: DC

## 2012-06-26 MED ORDER — HYDROCORTISONE VALERATE 0.2 % EX OINT: TOPICAL_OINTMENT | Freq: Two times a day (BID) | CUTANEOUS | Status: DC

## 2012-06-26 NOTE — Addendum Note (Signed)
Addended by: Bertram Savin A on: 06/26/2012 04:11 PM   Modules accepted: Orders

## 2012-06-26 NOTE — Progress Notes (Signed)
Patient is a 79 year old who presented to the office today complaining of slight vulvar irritation. She was seen in the office 04/21/2012. Patient with known history of second-degree cystocele small urethrocele. Patient was diagnosed what appeared to be a vulvar contact dermatitis perhaps from soiling her from urine. She responded well to was scored cream 0.2% which she applied twice a day for 7-10 days. I had recommended she use Vaseline to the external genital area perineum and perirectal area least twice a day to help as a barrier.  Exam: Excoriated area of the external labia majora and perineum but to a minor degree than when she was seen back in November. Vagina: Second-degree cystocele small urethrocele well-lubricated although some atrophy was noted.  Wet prep was with no evidence of yeast or bacterial vaginosis. There was too numerous to count wbc's too numerous to count bacteria.  We're going to call her in a prescription for Cleocin vaginal cream to apply each bedtime for 7 days. She can use the Westcort cream the following week twice a day for one week if symptoms persist and then once a week thereafter as needed.

## 2012-06-29 DIAGNOSIS — N3941 Urge incontinence: Secondary | ICD-10-CM | POA: Diagnosis not present

## 2012-06-29 DIAGNOSIS — N390 Urinary tract infection, site not specified: Secondary | ICD-10-CM | POA: Diagnosis not present

## 2012-06-29 DIAGNOSIS — R339 Retention of urine, unspecified: Secondary | ICD-10-CM | POA: Diagnosis not present

## 2012-06-29 DIAGNOSIS — R300 Dysuria: Secondary | ICD-10-CM | POA: Diagnosis not present

## 2012-07-01 ENCOUNTER — Telehealth: Payer: Self-pay | Admitting: *Deleted

## 2012-07-01 NOTE — Telephone Encounter (Signed)
Pt called and said she can not use the Cleocin vaginal cream, because she has very much trouble using applicators vaginally. Pt said she will continue using the Westcort cream giving.

## 2012-07-03 DIAGNOSIS — R339 Retention of urine, unspecified: Secondary | ICD-10-CM | POA: Diagnosis not present

## 2012-07-03 DIAGNOSIS — N390 Urinary tract infection, site not specified: Secondary | ICD-10-CM | POA: Diagnosis not present

## 2012-07-09 ENCOUNTER — Telehealth: Payer: Self-pay | Admitting: Internal Medicine

## 2012-07-09 NOTE — Telephone Encounter (Signed)
Patient had some rectal bleeding tonight.  One episode.  She is advised to continue her rectal sup[positories and creams.  She will call me back if her bleeding increases tomorrow for an appt.

## 2012-07-09 NOTE — Telephone Encounter (Signed)
agree

## 2012-07-14 ENCOUNTER — Ambulatory Visit (INDEPENDENT_AMBULATORY_CARE_PROVIDER_SITE_OTHER): Payer: Medicare Other | Admitting: Internal Medicine

## 2012-07-14 ENCOUNTER — Encounter: Payer: Self-pay | Admitting: Internal Medicine

## 2012-07-14 ENCOUNTER — Other Ambulatory Visit (INDEPENDENT_AMBULATORY_CARE_PROVIDER_SITE_OTHER): Payer: Medicare Other

## 2012-07-14 VITALS — BP 112/72 | HR 88 | Temp 97.0°F | Resp 10 | Wt 102.4 lb

## 2012-07-14 DIAGNOSIS — Z7189 Other specified counseling: Secondary | ICD-10-CM

## 2012-07-14 DIAGNOSIS — I10 Essential (primary) hypertension: Secondary | ICD-10-CM

## 2012-07-14 DIAGNOSIS — Z85038 Personal history of other malignant neoplasm of large intestine: Secondary | ICD-10-CM

## 2012-07-14 DIAGNOSIS — E039 Hypothyroidism, unspecified: Secondary | ICD-10-CM

## 2012-07-14 DIAGNOSIS — N8111 Cystocele, midline: Secondary | ICD-10-CM

## 2012-07-14 DIAGNOSIS — K648 Other hemorrhoids: Secondary | ICD-10-CM

## 2012-07-14 DIAGNOSIS — N8110 Cystocele, unspecified: Secondary | ICD-10-CM

## 2012-07-14 DIAGNOSIS — D649 Anemia, unspecified: Secondary | ICD-10-CM

## 2012-07-14 DIAGNOSIS — Z8719 Personal history of other diseases of the digestive system: Secondary | ICD-10-CM

## 2012-07-14 DIAGNOSIS — N952 Postmenopausal atrophic vaginitis: Secondary | ICD-10-CM

## 2012-07-14 LAB — COMPREHENSIVE METABOLIC PANEL
ALT: 18 U/L (ref 0–35)
AST: 35 U/L (ref 0–37)
Albumin: 3.9 g/dL (ref 3.5–5.2)
Alkaline Phosphatase: 74 U/L (ref 39–117)
Calcium: 9.5 mg/dL (ref 8.4–10.5)
Chloride: 100 mEq/L (ref 96–112)
Potassium: 4.2 mEq/L (ref 3.5–5.1)
Sodium: 138 mEq/L (ref 135–145)

## 2012-07-14 LAB — CBC WITH DIFFERENTIAL/PLATELET
Basophils Absolute: 0 10*3/uL (ref 0.0–0.1)
Basophils Relative: 0.5 % (ref 0.0–3.0)
Eosinophils Absolute: 0.1 10*3/uL (ref 0.0–0.7)
Lymphocytes Relative: 24.8 % (ref 12.0–46.0)
MCHC: 33.9 g/dL (ref 30.0–36.0)
MCV: 92.5 fl (ref 78.0–100.0)
Monocytes Absolute: 0.3 10*3/uL (ref 0.1–1.0)
Neutrophils Relative %: 65.3 % (ref 43.0–77.0)
Platelets: 180 10*3/uL (ref 150.0–400.0)
RBC: 4.11 Mil/uL (ref 3.87–5.11)
RDW: 13.7 % (ref 11.5–14.6)

## 2012-07-14 NOTE — Patient Instructions (Addendum)
Urinary retention - stay with the tamsulosin. It may really help  Urinary track infection - ok to not finish the Septra. At your next Urology visit have them do a urinalysis as a test of cure.  Fecal incontinence - a common, unpleasant problem for which I have no remedy  Hematochezia - the episode of rectal bleeding of a large amount of blood is most likely a diverticular bleed. No treatment is needed. In regard to preventing diverticulitis -no stringent dietary restrictions but masticate well.  Gyn - continue with probiotics and follow Dr. Lily Peer regimen. If the S-string is not a viable option there is the option of oral estrogen replacement.   Will check a blood count, thyroid level, CEA today with results to be mailed to you.  Reviewed ACP (Advanced Care Planning) and we are on the same page: reasonable intervention but nothing heroic or intensive care.

## 2012-07-15 DIAGNOSIS — Z7189 Other specified counseling: Secondary | ICD-10-CM | POA: Insufficient documentation

## 2012-07-15 LAB — CEA: CEA: <0.5 ng/mL (ref 0.0–5.0)

## 2012-07-15 NOTE — Assessment & Plan Note (Signed)
Reviewed possible mechanism of retention related to prolapse of the bladder. Also considered the relationship to spinal stenosis. She reports a sense of improved flow using tamsulosin.  Plan Continue tamsulosin  Follow up with Dr. Patsi Sears

## 2012-07-15 NOTE — Progress Notes (Signed)
Subjective:    Patient ID: [name] Small, female    DOB: [birth year], 79 y.o.   MRN: [id number]  HPI [name] Small presents today for medical follow -up. She wants to bring me up to date in regard to several problems; 1. Gyn problems with atrophic vaginitis and discomfort - recent visit to Dr. Lily Peer. She was unable to use a vaginal applicator as prescribed. Her problems remain unchanged 2. GU - she has been struggling with urinary retention. Chart notes reviewed - she had a 600 cc residual that was drained. She was started on tamsulosin. Next OV she had a 300 cc residual. She feels she is doing somewhat better and has GU appt 1/29 3. GI - she does have minor fecal soilage and  Some fecal loss with micturition. She reports one episode of hematochezia w/ large volume of blood followed by minor loss x 1. Reveiwed chart - last colonoscopy in '06 w/ diverticulosis.  She is in good spirits and does have good insight into her problems and keeps this in perspective with her age. Her main concern is quality of life and an unpredictable bowel and bladder condition is very inconvenient, especially at bridge parties, etc. She does remain very active. In order to self regulate how fast she walks and to reduce fall risk she has started using a rolling walker. She still drives and remains independent.  PMH, FamHx and SocHx reviewed for any changes and relevance. Current Outpatient Prescriptions on File Prior to Visit  Medication Sig Dispense Refill  . Alpha-D-Galactosidase (BEANO PO) Take 2 tablets by mouth every morning before fiber intake and every evening      . ALPRAZolam (XANAX) 0.25 MG tablet Take 1 tablet (0.25 mg total) by mouth every 6 (six) hours as needed for anxiety.  100 tablet  3  . calcium carbonate (OS-CAL) 600 MG TABS Take 600 mg by mouth daily.        . calcium carbonate (TUMS) 500 MG chewable tablet Chew 1 tablet by mouth as needed.       . fluticasone (FLONASE) 50 MCG/ACT nasal  spray TAKE AS NEEDED  16 g  2  . hydrocortisone (ANUSOL-HC) 25 MG suppository INSERT 1 SUPPOSITORY RECTALLY 2 TIMES A DAY AS DIRECTED.  12 suppository  0  . hydrocortisone valerate ointment (WEST-CORT) 0.2 % Apply topically 2 (two) times daily. Apply 2 times a day for one week. When the week is finished apply Vaseline to the area 2 times a day indefinitely.  You can use the Westcort cream once a week after the week treatment  45 g  3  . Lactobacillus (REPHRESH PRO-B) CAPS Take 1 by mouth every day       . levothyroxine (SYNTHROID, LEVOTHROID) 75 MCG tablet TAKE 1 TABLET (75 MCG TOTAL)  BY MOUTH DAILY.  30 tablet  10  . Multiple Vitamin (MULTIVITAMIN) tablet Take 1 tablet by mouth daily.        Marland Kitchen OVER THE COUNTER MEDICATION Take by mouth 2 (two) times daily. Supreme Doliphus.      . polyethylene glycol powder (MIRALAX) powder Take 17 g by mouth daily.        . Probiotic Product (ALIGN) 4 MG CAPS Take 1 tablet by mouth daily.        Marland Kitchen PROCTOCREAM-HC 2.5 % rectal cream APPLY TO RECTUM TWICE A DAY AS NEEDED.  30 g  3  . ranitidine (ZANTAC) 150 MG tablet Take 150 mg by mouth 2 (two) times daily.      Marland Kitchen  terconazole (TERAZOL 3) 0.8 % vaginal cream Insert 1 applicator in vagina for 3 nights and apply outside twice a day as needed for external itching.  20 g  0  . zolpidem (AMBIEN) 5 MG tablet Take 1 tablet (5 mg total) by mouth at bedtime as needed.  30 tablet  0      Review of Systems  Constitutional: Positive for appetite change. Negative for fever, chills and activity change.       Smell is down so taste is down so food is less appetizing  HENT: Negative.   Eyes: Negative.   Respiratory: Negative for cough, chest tightness and shortness of breath.   Cardiovascular: Negative for chest pain and palpitations.  Gastrointestinal: Positive for blood in stool and anal bleeding. Negative for abdominal pain, diarrhea and rectal pain.  Genitourinary: Positive for dysuria, vaginal bleeding and difficulty  urinating. Negative for frequency, hematuria and vaginal pain.  Musculoskeletal: Negative for arthralgias and gait problem.  Skin: Negative.   Neurological: Negative for dizziness, tremors, weakness and light-headedness.  Hematological: Negative.   Psychiatric/Behavioral: Negative for confusion and decreased concentration.       Objective:   Physical Exam Filed Vitals:   07/14/12 0927  BP: 112/72  Pulse: 88  Temp: 97 F (36.1 C)  Resp: 10   Wt Readings from Last 3 Encounters:  07/14/12 102 lb 6.4 oz (46.448 kg)  04/07/12 101 lb (45.813 kg)  02/24/12 100 lb (45.36 kg)   Gen'l- an elderly, thin white woman in no distress HEENT- C&S clear, very dry mouth Cor 2+ radial pulse Pulm - normal respirations Neuro- A&O x 3, speech clear, thinking is clear, memory is normal       Assessment & Plan:

## 2012-07-15 NOTE — Assessment & Plan Note (Signed)
For her peace of mind - CEA ordered, results pending

## 2012-07-15 NOTE — Assessment & Plan Note (Signed)
With recent hematochezia and h/o iron deficiency anemia CBC ordered - results pending. Recommendations to follow

## 2012-07-15 NOTE — Assessment & Plan Note (Signed)
DNR/DNI, no heroic measures, e.g. Prolonged ICU care, no tube feeding, no prolonged IVF. 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

## 2012-07-15 NOTE — Assessment & Plan Note (Signed)
Lab Results  Component Value Date   TSH 0.50 07/14/2012   Good control. No change in medication

## 2012-07-15 NOTE — Assessment & Plan Note (Signed)
Continued issue for her. She was unable to use vaginal applicator. Suggested that if she was really uncomfortable oral HRT is an option to consider with very little risk re: breast cancer, CVA or heart disease at her age.   Plan Defer to Dr. Lily Peer

## 2012-07-15 NOTE — Assessment & Plan Note (Signed)
Suspect her episode of hematochezia, which she states was different from hemorrhoidal bleeding, was mild, short-lived diverticular bleed. Explained this to her in detail.

## 2012-07-17 ENCOUNTER — Encounter: Payer: Self-pay | Admitting: Internal Medicine

## 2012-07-20 DIAGNOSIS — R339 Retention of urine, unspecified: Secondary | ICD-10-CM | POA: Diagnosis not present

## 2012-07-20 DIAGNOSIS — N390 Urinary tract infection, site not specified: Secondary | ICD-10-CM | POA: Diagnosis not present

## 2012-07-22 DIAGNOSIS — M6281 Muscle weakness (generalized): Secondary | ICD-10-CM | POA: Diagnosis not present

## 2012-07-22 DIAGNOSIS — N8110 Cystocele, unspecified: Secondary | ICD-10-CM | POA: Diagnosis not present

## 2012-07-22 DIAGNOSIS — R279 Unspecified lack of coordination: Secondary | ICD-10-CM | POA: Diagnosis not present

## 2012-07-22 DIAGNOSIS — R339 Retention of urine, unspecified: Secondary | ICD-10-CM | POA: Diagnosis not present

## 2012-08-03 ENCOUNTER — Other Ambulatory Visit: Payer: Self-pay | Admitting: Internal Medicine

## 2012-08-03 DIAGNOSIS — H903 Sensorineural hearing loss, bilateral: Secondary | ICD-10-CM | POA: Diagnosis not present

## 2012-08-03 DIAGNOSIS — H6120 Impacted cerumen, unspecified ear: Secondary | ICD-10-CM | POA: Diagnosis not present

## 2012-08-03 DIAGNOSIS — S01309A Unspecified open wound of unspecified ear, initial encounter: Secondary | ICD-10-CM | POA: Diagnosis not present

## 2012-08-10 DIAGNOSIS — R279 Unspecified lack of coordination: Secondary | ICD-10-CM | POA: Diagnosis not present

## 2012-08-10 DIAGNOSIS — M6281 Muscle weakness (generalized): Secondary | ICD-10-CM | POA: Diagnosis not present

## 2012-08-10 DIAGNOSIS — R3911 Hesitancy of micturition: Secondary | ICD-10-CM | POA: Diagnosis not present

## 2012-08-10 DIAGNOSIS — R339 Retention of urine, unspecified: Secondary | ICD-10-CM | POA: Diagnosis not present

## 2012-08-18 ENCOUNTER — Ambulatory Visit: Payer: Medicare Other | Admitting: Oncology

## 2012-08-18 ENCOUNTER — Other Ambulatory Visit: Payer: Medicare Other | Admitting: Lab

## 2012-08-18 DIAGNOSIS — R339 Retention of urine, unspecified: Secondary | ICD-10-CM | POA: Diagnosis not present

## 2012-09-08 DIAGNOSIS — R339 Retention of urine, unspecified: Secondary | ICD-10-CM | POA: Diagnosis not present

## 2012-09-08 DIAGNOSIS — N8110 Cystocele, unspecified: Secondary | ICD-10-CM | POA: Diagnosis not present

## 2012-09-08 DIAGNOSIS — M6281 Muscle weakness (generalized): Secondary | ICD-10-CM | POA: Diagnosis not present

## 2012-09-08 DIAGNOSIS — R279 Unspecified lack of coordination: Secondary | ICD-10-CM | POA: Diagnosis not present

## 2012-09-15 DIAGNOSIS — S01309A Unspecified open wound of unspecified ear, initial encounter: Secondary | ICD-10-CM | POA: Diagnosis not present

## 2012-09-15 DIAGNOSIS — H6120 Impacted cerumen, unspecified ear: Secondary | ICD-10-CM | POA: Diagnosis not present

## 2012-09-15 DIAGNOSIS — H903 Sensorineural hearing loss, bilateral: Secondary | ICD-10-CM | POA: Diagnosis not present

## 2012-10-07 DIAGNOSIS — R3911 Hesitancy of micturition: Secondary | ICD-10-CM | POA: Diagnosis not present

## 2012-10-07 DIAGNOSIS — H3530 Unspecified macular degeneration: Secondary | ICD-10-CM | POA: Diagnosis not present

## 2012-10-07 DIAGNOSIS — H353190 Nonexudative age-related macular degeneration, unspecified eye, stage unspecified: Secondary | ICD-10-CM | POA: Diagnosis not present

## 2012-10-07 DIAGNOSIS — M6281 Muscle weakness (generalized): Secondary | ICD-10-CM | POA: Diagnosis not present

## 2012-10-07 DIAGNOSIS — R339 Retention of urine, unspecified: Secondary | ICD-10-CM | POA: Diagnosis not present

## 2012-10-07 DIAGNOSIS — R279 Unspecified lack of coordination: Secondary | ICD-10-CM | POA: Diagnosis not present

## 2012-10-14 ENCOUNTER — Telehealth: Payer: Self-pay

## 2012-10-14 DIAGNOSIS — R479 Unspecified speech disturbances: Secondary | ICD-10-CM

## 2012-10-14 NOTE — Telephone Encounter (Signed)
Phone call from pt stating her eyes are very dry. Also she states her teeth are in bad shape, hitting against her gums and it is making her talk funny. She would like a referral for speech therapy. Please advise.

## 2012-10-14 NOTE — Telephone Encounter (Signed)
Order for ST placed as requested

## 2012-10-15 NOTE — Telephone Encounter (Signed)
LVM for pt that order has been placed and she will be getting a phone call.

## 2012-10-22 ENCOUNTER — Telehealth: Payer: Self-pay | Admitting: *Deleted

## 2012-10-22 NOTE — Telephone Encounter (Signed)
Patient informed. 

## 2012-10-22 NOTE — Telephone Encounter (Signed)
Called left messge to call back 

## 2012-10-22 NOTE — Telephone Encounter (Signed)
Last TSH normal jan 2014  Low thyroid does not usually affect eye tearing or dryness, but other conditions such as sjogrens can  Please consider OTC murine eye drops prn

## 2012-10-22 NOTE — Telephone Encounter (Signed)
Pt wants to know if there is an alternative to Synthroid that she can take. Synthroid causes her eyes to be very dry per pt.

## 2012-10-23 ENCOUNTER — Encounter: Payer: Medicare Other | Admitting: Speech Pathology

## 2012-10-27 ENCOUNTER — Ambulatory Visit (INDEPENDENT_AMBULATORY_CARE_PROVIDER_SITE_OTHER): Payer: Medicare Other | Admitting: Internal Medicine

## 2012-10-27 ENCOUNTER — Encounter: Payer: Self-pay | Admitting: Internal Medicine

## 2012-10-27 VITALS — BP 130/82 | HR 113 | Temp 98.2°F | Ht 59.0 in | Wt 100.0 lb

## 2012-10-27 DIAGNOSIS — H04129 Dry eye syndrome of unspecified lacrimal gland: Secondary | ICD-10-CM | POA: Diagnosis not present

## 2012-10-27 DIAGNOSIS — Z789 Other specified health status: Secondary | ICD-10-CM

## 2012-10-27 DIAGNOSIS — K117 Disturbances of salivary secretion: Secondary | ICD-10-CM | POA: Diagnosis not present

## 2012-10-27 DIAGNOSIS — H04123 Dry eye syndrome of bilateral lacrimal glands: Secondary | ICD-10-CM | POA: Insufficient documentation

## 2012-10-27 DIAGNOSIS — R682 Dry mouth, unspecified: Secondary | ICD-10-CM | POA: Insufficient documentation

## 2012-10-27 DIAGNOSIS — R479 Unspecified speech disturbances: Secondary | ICD-10-CM

## 2012-10-27 MED ORDER — DIPHENHYD-HYDROCORT-NYSTATIN MT SUSP: OROMUCOSAL | Status: DC

## 2012-10-27 NOTE — Assessment & Plan Note (Signed)
?   sjogrens related, cont eye drops, f/u optho as needed, refer rheum as well

## 2012-10-27 NOTE — Assessment & Plan Note (Signed)
For magic mouthwash trial, refer rheum - ? sjogrens

## 2012-10-27 NOTE — Assessment & Plan Note (Signed)
Ok for speech therapy eval but ? Related to dry mouth

## 2012-10-27 NOTE — Patient Instructions (Signed)
Please take all new medication as prescribed - the magic mouthwash (sent to [facility]) Please continue all other medications as before You will be contacted regarding the referral for: Speech Therapy, as well as Rheumatology for the ? Of Sjogren's Syndrome Please keep your appointments with your specialists as you have planned - opthamology Please have the pharmacy call with any other refills you may need.

## 2012-10-27 NOTE — Progress Notes (Signed)
Subjective:    Patient ID: [name] Small, female    DOB: 04-15-1917, 79 y.o.   MRN: [id number]  HPI  Here to f/u, has significant dry eyes and mouth, apparently with worsening tongue pain/"ridging"/tenderness and difficulty speaking, asks for speech therapy eval and tx.  No hx of sjogrens or related illness. Using otc murine eye drop like drops and working well.  Has seen optho with o/w neg exam per pt.  Pt denies chest pain, increased sob or doe, wheezing, orthopnea, PND, increased LE swelling, palpitations, dizziness or syncope.  No synovitis or rash Past Medical History  Diagnosis Date  . Prolapsed urethral mucosa   . Osteoarthrosis, unspecified whether generalized or localized, pelvic region and thigh   . Full incontinence of feces   . Asymptomatic varicose veins   . Anemia, unspecified   . Idiopathic osteoporosis   . Melanoma of skin, site unspecified   . Gastroenteritis   . Iron deficiency anemia, unspecified   . Irritable bowel syndrome   . Internal hemorrhoids without mention of complication   . Carotid bruit     hx of  . Neuropathy     peripheral  . HTN (hypertension)   . Esophageal reflux   . Diverticulosis   . Dyslipidemia   . Spinal stenosis, unspecified region other than cervical   . Hypothyroidism   . Colon adenocarcinoma     hx of  . Fecal incontinence   . Melanoma   . MVP (mitral valve prolapse)   . Female bladder prolapse, acquired 07/17/2010  . Vaginal bleeding, abnormal   . Varicose veins   . Idiopathic osteoporosis   . Vaginitis, atrophic   . Fibroid   . Carotid bruit     doppler normal in the past  . Hemorrhoid   . OA (osteoarthritis)     left hip   Past Surgical History  Procedure Laterality Date  . Hemicolectomy      right  . Colonoscopy w/ polypectomy  2006    3 mm polyp destroyed, diverticulosis  . Esophagogastroduodenoscopy  2009    mild gastritis  . Hemorrhoid surgery    . Hysteroscopy    . Dilation and curettage of uterus    .  Melanoma excision      reports that she has quit smoking. She has never used smokeless tobacco. She reports that she drinks about 1.0 ounces of alcohol per week. She reports that she does not use illicit drugs. family history includes Cancer in her maternal grandfather; Diabetes in her father; Heart disease in her father; and Stomach cancer in her maternal grandfather.  There is no history of Colon cancer. Allergies  Allergen Reactions  . Amoxil (Amoxicillin) Diarrhea  . Crab (Shellfish Allergy)     sensitivity  . Demerol   . Meperidine Hcl Other (See Comments)    Drop in blood pressure  . Neosporin (Neomycin-Bacitracin Zn-Polymyx)   . Infed (Iron Dextran) Rash    Became very red over face, scalp, and arms   Current Outpatient Prescriptions on File Prior to Visit  Medication Sig Dispense Refill  . Alpha-D-Galactosidase (BEANO PO) Take 2 tablets by mouth every morning before fiber intake and every evening      . ALPRAZolam (XANAX) 0.25 MG tablet Take 1 tablet (0.25 mg total) by mouth every 6 (six) hours as needed for anxiety.  100 tablet  3  . calcium carbonate (OS-CAL) 600 MG TABS Take 600 mg by mouth daily.        Marland Kitchen  calcium carbonate (TUMS) 500 MG chewable tablet Chew 1 tablet by mouth as needed.       . fluticasone (FLONASE) 50 MCG/ACT nasal spray TAKE AS NEEDED  16 g  2  . hydrocortisone (ANUSOL-HC) 25 MG suppository INSERT 1 SUPPOSITORY RECTALLY 2 TIMES A DAY AS DIRECTED.  12 suppository  0  . hydrocortisone valerate ointment (WEST-CORT) 0.2 % Apply topically 2 (two) times daily. Apply 2 times a day for one week. When the week is finished apply Vaseline to the area 2 times a day indefinitely.  You can use the Westcort cream once a week after the week treatment  45 g  3  . Lactobacillus (REPHRESH PRO-B) CAPS Take 1 by mouth every day       . levothyroxine (SYNTHROID, LEVOTHROID) 75 MCG tablet TAKE 1 TABLET (75 MCG TOTAL)  BY MOUTH DAILY.  30 tablet  10  . Multiple Vitamin (MULTIVITAMIN)  tablet Take 1 tablet by mouth daily.        . Multiple Vitamins-Minerals (ICAPS) CAPS Take 2 capsules by mouth.      Marland Kitchen OVER THE COUNTER MEDICATION Take by mouth 2 (two) times daily. Supreme Doliphus.      . polyethylene glycol powder (MIRALAX) powder Take 17 g by mouth daily.        . Probiotic Product (ALIGN) 4 MG CAPS Take 1 tablet by mouth daily.        Marland Kitchen PROCTOCREAM-HC 2.5 % rectal cream APPLY TO RECTUM TWICE A DAY AS NEEDED.  30 g  3  . ranitidine (ZANTAC) 150 MG tablet Take 150 mg by mouth 2 (two) times daily.      Marland Kitchen sulfamethoxazole-trimethoprim (BACTRIM DS) 800-160 MG per tablet       . Tamsulosin HCl (FLOMAX) 0.4 MG CAPS       . terconazole (TERAZOL 3) 0.8 % vaginal cream Insert 1 applicator in vagina for 3 nights and apply outside twice a day as needed for external itching.  20 g  0  . zolpidem (AMBIEN) 5 MG tablet Take 1 tablet (5 mg total) by mouth at bedtime as needed.  30 tablet  0   No current facility-administered medications on file prior to visit.   Review of Systems  Constitutional: Negative for unexpected weight change, or unusual diaphoresis  HENT: Negative for tinnitus.   Eyes: Negative for photophobia and visual disturbance.  Respiratory: Negative for choking and stridor.   Gastrointestinal: Negative for vomiting and blood in stool.  Genitourinary: Negative for hematuria and decreased urine volume.  Musculoskeletal: Negative for acute joint swelling Skin: Negative for color change and wound.  Neurological: Negative for tremors and numbness other than noted  Psychiatric/Behavioral: Negative for decreased concentration or  hyperactivity.       Objective:   Physical Exam BP 130/82  Pulse 113  Temp(Src) 98.2 F (36.8 C) (Oral)  Ht 4\' 11"  (1.499 m)  Wt 100 lb (45.36 kg)  BMI 20.19 kg/m2  SpO2 95% VS noted, not ill appearing Constitutional: Pt appears well-developed and well-nourished.  HENT: Head: NCAT.  Right Ear: External ear normal.  Left Ear: External  ear normal.  Eyes: Conjunctivae and EOM are normal and not overly dry having used the eye drops,  Pupils are equal, round, and reactive to light. ; tongue with tender fissures, ridges, dryness Neck: Normal range of motion. Neck supple.  Cardiovascular: Normal rate and regular rhythm.   Pulmonary/Chest: Effort normal and breath sounds normal.  Neurological: Pt is alert. Not confused  Skin: Skin is warm. No erythema.  Psychiatric: Pt behavior is normal. Thought content normal.     Assessment & Plan:

## 2012-11-02 DIAGNOSIS — H259 Unspecified age-related cataract: Secondary | ICD-10-CM | POA: Diagnosis not present

## 2012-11-02 DIAGNOSIS — H01009 Unspecified blepharitis unspecified eye, unspecified eyelid: Secondary | ICD-10-CM | POA: Diagnosis not present

## 2012-11-02 DIAGNOSIS — H02109 Unspecified ectropion of unspecified eye, unspecified eyelid: Secondary | ICD-10-CM | POA: Diagnosis not present

## 2012-11-03 ENCOUNTER — Ambulatory Visit: Payer: Medicare Other | Attending: Internal Medicine | Admitting: Speech Pathology

## 2012-11-03 DIAGNOSIS — IMO0001 Reserved for inherently not codable concepts without codable children: Secondary | ICD-10-CM | POA: Insufficient documentation

## 2012-11-03 DIAGNOSIS — R471 Dysarthria and anarthria: Secondary | ICD-10-CM | POA: Insufficient documentation

## 2012-11-04 ENCOUNTER — Telehealth: Payer: Self-pay | Admitting: *Deleted

## 2012-11-04 ENCOUNTER — Encounter: Payer: Self-pay | Admitting: Gynecology

## 2012-11-04 ENCOUNTER — Ambulatory Visit (INDEPENDENT_AMBULATORY_CARE_PROVIDER_SITE_OTHER): Payer: Medicare Other | Admitting: Gynecology

## 2012-11-04 ENCOUNTER — Encounter: Payer: Medicare Other | Admitting: Speech Pathology

## 2012-11-04 VITALS — BP 126/72 | Ht 59.25 in | Wt 102.0 lb

## 2012-11-04 DIAGNOSIS — N898 Other specified noninflammatory disorders of vagina: Secondary | ICD-10-CM

## 2012-11-04 DIAGNOSIS — N8111 Cystocele, midline: Secondary | ICD-10-CM

## 2012-11-04 DIAGNOSIS — M810 Age-related osteoporosis without current pathological fracture: Secondary | ICD-10-CM | POA: Diagnosis not present

## 2012-11-04 DIAGNOSIS — Z780 Asymptomatic menopausal state: Secondary | ICD-10-CM

## 2012-11-04 DIAGNOSIS — IMO0002 Reserved for concepts with insufficient information to code with codable children: Secondary | ICD-10-CM

## 2012-11-04 DIAGNOSIS — N950 Postmenopausal bleeding: Secondary | ICD-10-CM

## 2012-11-04 LAB — WET PREP FOR TRICH, YEAST, CLUE: Trich, Wet Prep: NONE SEEN

## 2012-11-04 MED ORDER — METRONIDAZOLE 500 MG PO TABS: 500.0000 mg | ORAL_TABLET | Freq: Two times a day (BID) | ORAL | Status: DC

## 2012-11-04 MED ORDER — NONFORMULARY OR COMPOUNDED ITEM: Status: DC

## 2012-11-04 NOTE — Progress Notes (Signed)
[name] Small January 15, 1917 [id number]   History:    79 y.o.  Complaining of vaginal irritation and slight discharge patient states that sometimes she has had brownish-like discharge. She has been treated for bacterial vaginosis in the past as well as for yeast infections. Several times in the past my colleague Dr. Eda Paschal has attempted to insert the Estring but due to her cystocele which has gotten worse causes it to fall out. She also has a history of osteoporosis for which he has attempted to put her on Actonel and only took it for 2-3 years and discontinued because of side effects. She is taking calcium and vitamin D. She reports no prior fractures. Her last bone density study was in 2013 her lowest T score was in the distal one third forearm with a value -3. The patient refuses any further bone density study. Her last mammogram was in 2012 which she refuses to have any further mammograms. Patient is a colon cancer survivor and sees Dr. Leone Payor. Patient refuses any further colonoscopy. Dr. Debby Bud is her PCP and does her lab work.  Past medical history,surgical history, family history and social history were all reviewed and documented in the EPIC chart.  Gynecologic History No LMP recorded. Patient is postmenopausal. Contraception: post menopausal status Last Pap: 2011. Results were: normal Last mammogram: 2012. Results were: normal  Obstetric History OB History   Grav Para Term Preterm Abortions TAB SAB Ect Mult Living   2 2 2       2      # Outc Date GA Lbr Len/2nd Wgt Sex Del Anes PTL Lv   1 TRM            2 TRM                ROS: A ROS was performed and pertinent positives and negatives are included in the history.  GENERAL: No fevers or chills. HEENT: No change in vision, no earache, sore throat or sinus congestion. NECK: No pain or stiffness. CARDIOVASCULAR: No chest pain or pressure. No palpitations. PULMONARY: No shortness of breath, cough or wheeze. GASTROINTESTINAL: No  abdominal pain, nausea, vomiting or diarrhea, melena or bright red blood per rectum. GENITOURINARY: No urinary frequency, urgency, hesitancy or dysuria. MUSCULOSKELETAL: No joint or muscle pain, no back pain, no recent trauma. DERMATOLOGIC: No rash, no itching, no lesions. ENDOCRINE: No polyuria, polydipsia, no heat or cold intolerance. No recent change in weight. HEMATOLOGICAL: No anemia or easy bruising or bleeding. NEUROLOGIC: No headache, seizures, numbness, tingling or weakness. PSYCHIATRIC: No depression, no loss of interest in normal activity or change in sleep pattern.     Exam: chaperone present  BP 126/72  Ht 4' 11.25" (1.505 m)  Wt 102 lb (46.267 kg)  BMI 20.43 kg/m2  Body mass index is 20.43 kg/(m^2).  General appearance : Well developed well nourished female. No acute distress HEENT: Neck supple, trachea midline, no carotid bruits, no thyroidmegaly Lungs: Clear to auscultation, no rhonchi or wheezes, or rib retractions  Heart: Regular rate and rhythm, no murmurs or gallops Breast:Examined in sitting and supine position were symmetrical in appearance, no palpable masses or tenderness,  no skin retraction, no nipple inversion, no nipple discharge, no skin discoloration, no axillary or supraclavicular lymphadenopathy Abdomen: no palpable masses or tenderness, no rebound or guarding Extremities: no edema or skin discoloration or tenderness  Pelvic:  Bartholin, Urethra, Skene Glands: Within normal limits             Vagina:  Second degree cystocele, Clear-like discharge, vaginal atrophy some superficial tear in the area of the fourchette  Cervix: No gross lesions or discharge  Uterus  axial, normal size, shape and consistency, non-tender and mobile  Adnexa  Without masses or tenderness  Anus and perineum  normal   Rectovaginal  normal sphincter tone without palpated masses or tenderness             Hemoccult PCP   Wet prep to numerous to count white blood cell and too numerous  to count bacteria  Assessment/Plan:  79 y.o. female who has reported brownish-like discharge on and off for quite some time. Review of her record indicated that in 2013 her ultrasound demonstrated her endometrial stripe was 1.2 mm and she had a small calcified uterine myoma and a small right ovarian solid calcification measured 11 x 9 x 10 mm and left ovary was normal. For her apparent bacterial vaginosis she will be placed on Flagyl 500 mg twice a day for 5 days. We discussed for her vaginal irritation dryness to use the estradiol 0.02% twice a week. She refuses any additional mammogram, bone density studies, or colonoscopy. She states that she's 30 activities her calcium and vitamin D. She will return back to the office in 1-2 weeks for an ultrasound to look at her endometrial stripe and if it is greater than 5 mm we will need to consider then do an endometrial biopsy. If they did the brownish discharge may be coming from the vaginal tear at the fornix because of her vaginal atrophy.   Ok Edwards MD, 1:42 PM 11/04/2012

## 2012-11-04 NOTE — Telephone Encounter (Signed)
rx for estradiol 0.2 % cream called into pharmacy. Pt was given today at office visit.

## 2012-11-04 NOTE — Patient Instructions (Addendum)

## 2012-11-10 ENCOUNTER — Ambulatory Visit: Payer: Medicare Other | Admitting: Speech Pathology

## 2012-11-10 DIAGNOSIS — R471 Dysarthria and anarthria: Secondary | ICD-10-CM | POA: Diagnosis not present

## 2012-11-10 DIAGNOSIS — H903 Sensorineural hearing loss, bilateral: Secondary | ICD-10-CM | POA: Diagnosis not present

## 2012-11-10 DIAGNOSIS — H6120 Impacted cerumen, unspecified ear: Secondary | ICD-10-CM | POA: Diagnosis not present

## 2012-11-10 DIAGNOSIS — IMO0001 Reserved for inherently not codable concepts without codable children: Secondary | ICD-10-CM | POA: Diagnosis not present

## 2012-11-11 DIAGNOSIS — R3911 Hesitancy of micturition: Secondary | ICD-10-CM | POA: Diagnosis not present

## 2012-11-11 DIAGNOSIS — M6281 Muscle weakness (generalized): Secondary | ICD-10-CM | POA: Diagnosis not present

## 2012-11-11 DIAGNOSIS — R279 Unspecified lack of coordination: Secondary | ICD-10-CM | POA: Diagnosis not present

## 2012-11-11 DIAGNOSIS — R339 Retention of urine, unspecified: Secondary | ICD-10-CM | POA: Diagnosis not present

## 2012-11-20 ENCOUNTER — Ambulatory Visit: Payer: Medicare Other | Attending: Internal Medicine

## 2012-11-20 DIAGNOSIS — R471 Dysarthria and anarthria: Secondary | ICD-10-CM | POA: Insufficient documentation

## 2012-11-20 DIAGNOSIS — IMO0001 Reserved for inherently not codable concepts without codable children: Secondary | ICD-10-CM | POA: Diagnosis not present

## 2012-11-25 ENCOUNTER — Ambulatory Visit (INDEPENDENT_AMBULATORY_CARE_PROVIDER_SITE_OTHER): Payer: Medicare Other | Admitting: Gynecology

## 2012-11-25 ENCOUNTER — Encounter: Payer: Self-pay | Admitting: Gynecology

## 2012-11-25 ENCOUNTER — Ambulatory Visit (INDEPENDENT_AMBULATORY_CARE_PROVIDER_SITE_OTHER): Payer: Medicare Other

## 2012-11-25 DIAGNOSIS — N841 Polyp of cervix uteri: Secondary | ICD-10-CM

## 2012-11-25 DIAGNOSIS — N83339 Acquired atrophy of ovary and fallopian tube, unspecified side: Secondary | ICD-10-CM

## 2012-11-25 DIAGNOSIS — N950 Postmenopausal bleeding: Secondary | ICD-10-CM

## 2012-11-25 DIAGNOSIS — N390 Urinary tract infection, site not specified: Secondary | ICD-10-CM | POA: Diagnosis not present

## 2012-11-25 DIAGNOSIS — D259 Leiomyoma of uterus, unspecified: Secondary | ICD-10-CM

## 2012-11-25 DIAGNOSIS — D251 Intramural leiomyoma of uterus: Secondary | ICD-10-CM

## 2012-11-25 DIAGNOSIS — IMO0002 Reserved for concepts with insufficient information to code with codable children: Secondary | ICD-10-CM

## 2012-11-25 DIAGNOSIS — N838 Other noninflammatory disorders of ovary, fallopian tube and broad ligament: Secondary | ICD-10-CM

## 2012-11-25 DIAGNOSIS — N898 Other specified noninflammatory disorders of vagina: Secondary | ICD-10-CM

## 2012-11-25 DIAGNOSIS — N8111 Cystocele, midline: Secondary | ICD-10-CM

## 2012-11-25 DIAGNOSIS — N952 Postmenopausal atrophic vaginitis: Secondary | ICD-10-CM

## 2012-11-25 DIAGNOSIS — N8110 Cystocele, unspecified: Secondary | ICD-10-CM | POA: Diagnosis not present

## 2012-11-25 DIAGNOSIS — N839 Noninflammatory disorder of ovary, fallopian tube and broad ligament, unspecified: Secondary | ICD-10-CM

## 2012-11-25 DIAGNOSIS — N939 Abnormal uterine and vaginal bleeding, unspecified: Secondary | ICD-10-CM

## 2012-11-25 DIAGNOSIS — N368 Other specified disorders of urethra: Secondary | ICD-10-CM | POA: Diagnosis not present

## 2012-11-25 NOTE — Progress Notes (Signed)
Patient is a 79 year old who was seen on May 21 complaining of vaginal irritation slight brownish discharge. She has been treated for bacterial vaginosis in the past as well as for yeast infections. Several times in the past my colleague Dr. Eda Paschal has attempted to insert the Estring but due to her cystocele which has gotten worse causes it to fall out. On exam she had been noted to have a second-degree cystocele with vaginal atrophy a superficial vaginal tears in the fourchette had been noted. A wet prep during that visit demonstrated bacterial vaginosis and she was placed on Flagyl 500 mg twice a day for 5 days which she took. We discussed for her vaginal irritation dryness to use the estradiol 0.02% twice a week.   She stated that she began using the vaginal estradiol but cause her some irritation with the applicator she noticed a brownish slimy type material coming out of her vagina. She had seen her urologist Dr. Patsi Sears today and he is running a urine and culture.  She was asked to return to the office for an ultrasound to look at her endometrial stripe and that if it would've been greater than 5 mm we would proceed with an endometrial biopsy. It appears that her Manson Passey is discharged is coming in for the vaginal tear at the fourchette as a result of her vaginal atrophy.  Ultrasound: Uterus measures 6.1 x 4.8 x 3.4 cm with endometrial stripe of 4.2 mm. Uterus anteverted with fluid-filled endometrial cavity right calcified fibroid measured 4 mm. The fluid-filled cervix with right anterior wall defect vascular flow 4.5 mm. Right ovary atrophic continued presence of solid calcification measured 7 x 9 mm avascular. Left ovary was atrophic.  Ultrasound with no change from previous scan a previous years. Patient was reassured she was instructed that for this next month to apply the estradiol cream only on the external vulva twice a week then after the month beginning to use the applicator inside her  vagina gradually to help with her atrophy. She has any further problems or questions she will return back to the office. We will hold off on an endometrial biopsy for now.

## 2012-12-01 ENCOUNTER — Ambulatory Visit: Payer: Medicare Other

## 2012-12-01 DIAGNOSIS — R471 Dysarthria and anarthria: Secondary | ICD-10-CM | POA: Diagnosis not present

## 2012-12-01 DIAGNOSIS — IMO0001 Reserved for inherently not codable concepts without codable children: Secondary | ICD-10-CM | POA: Diagnosis not present

## 2012-12-07 DIAGNOSIS — R279 Unspecified lack of coordination: Secondary | ICD-10-CM | POA: Diagnosis not present

## 2012-12-07 DIAGNOSIS — R339 Retention of urine, unspecified: Secondary | ICD-10-CM | POA: Diagnosis not present

## 2012-12-07 DIAGNOSIS — M6281 Muscle weakness (generalized): Secondary | ICD-10-CM | POA: Diagnosis not present

## 2012-12-07 DIAGNOSIS — N8110 Cystocele, unspecified: Secondary | ICD-10-CM | POA: Diagnosis not present

## 2012-12-14 ENCOUNTER — Ambulatory Visit: Payer: Medicare Other

## 2012-12-14 DIAGNOSIS — IMO0001 Reserved for inherently not codable concepts without codable children: Secondary | ICD-10-CM | POA: Diagnosis not present

## 2012-12-14 DIAGNOSIS — R471 Dysarthria and anarthria: Secondary | ICD-10-CM | POA: Diagnosis not present

## 2012-12-17 ENCOUNTER — Other Ambulatory Visit: Payer: Self-pay | Admitting: Internal Medicine

## 2012-12-21 ENCOUNTER — Other Ambulatory Visit: Payer: Self-pay

## 2012-12-21 MED ORDER — ALPRAZOLAM 0.25 MG PO TABS: 0.2500 mg | ORAL_TABLET | Freq: Four times a day (QID) | ORAL | Status: DC | PRN

## 2012-12-22 DIAGNOSIS — H6120 Impacted cerumen, unspecified ear: Secondary | ICD-10-CM | POA: Diagnosis not present

## 2012-12-22 DIAGNOSIS — H903 Sensorineural hearing loss, bilateral: Secondary | ICD-10-CM | POA: Diagnosis not present

## 2012-12-22 NOTE — Telephone Encounter (Signed)
Alprazolam called to pharmacy  

## 2012-12-25 ENCOUNTER — Telehealth: Payer: Self-pay | Admitting: *Deleted

## 2012-12-25 NOTE — Telephone Encounter (Signed)
(  PT AWARE YOU ARE OUT OF THE OFFICE) PT CALLING TO FOLLOW UP OV ON 11/25/12 SHE USED ESTRADIOL CREAM AS DIRECTED. THE 1 ST TIME USING VAGINALLY NO BLEEDING, BUT WITH THE 2 ND VAGINALLY SHE HAD HEAVY BLEEDING LIKE A PERIOD PER PT. ONLY THAT 1 DAY, SO PT STOPPED TAKING MEDICATION. NO BLEEDING NOW, SHE ASKED ME TO RELAY INFORMATION TO YOU. PLEASE ADVISE

## 2012-12-25 NOTE — Telephone Encounter (Signed)
She can continue to apply the estrogen cream twice a week but I would like to see he in the office in the next couple of weeks.

## 2012-12-28 NOTE — Telephone Encounter (Signed)
Pt informed with the below note, pt will call back to schedule OV 

## 2013-01-05 ENCOUNTER — Telehealth: Payer: Self-pay | Admitting: *Deleted

## 2013-01-05 ENCOUNTER — Encounter: Payer: Self-pay | Admitting: Gynecology

## 2013-01-05 ENCOUNTER — Ambulatory Visit (INDEPENDENT_AMBULATORY_CARE_PROVIDER_SITE_OTHER): Payer: Medicare Other | Admitting: Gynecology

## 2013-01-05 DIAGNOSIS — N8111 Cystocele, midline: Secondary | ICD-10-CM

## 2013-01-05 DIAGNOSIS — N952 Postmenopausal atrophic vaginitis: Secondary | ICD-10-CM | POA: Diagnosis not present

## 2013-01-05 DIAGNOSIS — R300 Dysuria: Secondary | ICD-10-CM

## 2013-01-05 DIAGNOSIS — N950 Postmenopausal bleeding: Secondary | ICD-10-CM

## 2013-01-05 DIAGNOSIS — N390 Urinary tract infection, site not specified: Secondary | ICD-10-CM

## 2013-01-05 DIAGNOSIS — N898 Other specified noninflammatory disorders of vagina: Secondary | ICD-10-CM | POA: Diagnosis not present

## 2013-01-05 DIAGNOSIS — D261 Other benign neoplasm of corpus uteri: Secondary | ICD-10-CM | POA: Diagnosis not present

## 2013-01-05 DIAGNOSIS — IMO0002 Reserved for concepts with insufficient information to code with codable children: Secondary | ICD-10-CM

## 2013-01-05 LAB — URINALYSIS W MICROSCOPIC + REFLEX CULTURE
Casts: NONE SEEN
Glucose, UA: NEGATIVE mg/dL
Nitrite: NEGATIVE
Squamous Epithelial / LPF: NONE SEEN
pH: 6 (ref 5.0–8.0)

## 2013-01-05 LAB — WET PREP FOR TRICH, YEAST, CLUE: Yeast Wet Prep HPF POC: NONE SEEN

## 2013-01-05 MED ORDER — CIPROFLOXACIN HCL 250 MG PO TABS: 250.0000 mg | ORAL_TABLET | Freq: Two times a day (BID) | ORAL | Status: DC

## 2013-01-05 MED ORDER — MEDROXYPROGESTERONE ACETATE 10 MG PO TABS: ORAL_TABLET | ORAL | Status: DC

## 2013-01-05 MED ORDER — MEDROXYPROGESTERONE ACETATE 2.5 MG PO TABS: ORAL_TABLET | ORAL | Status: DC

## 2013-01-05 NOTE — Telephone Encounter (Signed)
Pharmacy called regarding 2 Rx for provera sent to pharmacy provera 2.5 mg and provera 10 mg both with same directions 1 po daily for 10 days of the month. Pharmacist would like to know does pt need both Rx? If not please let me know which one and I can relay to pharmacy. Please advise

## 2013-01-05 NOTE — Patient Instructions (Addendum)
Urinary Tract Infection  Urinary tract infections (UTIs) can develop anywhere along your urinary tract. Your urinary tract is your body's drainage system for removing wastes and extra water. Your urinary tract includes two kidneys, two ureters, a bladder, and a urethra. Your kidneys are a pair of bean-shaped organs. Each kidney is about the size of your fist. They are located below your ribs, one on each side of your spine.  CAUSES  Infections are caused by microbes, which are microscopic organisms, including fungi, viruses, and bacteria. These organisms are so small that they can only be seen through a microscope. Bacteria are the microbes that most commonly cause UTIs.  SYMPTOMS   Symptoms of UTIs may vary by age and gender of the patient and by the location of the infection. Symptoms in young women typically include a frequent and intense urge to urinate and a painful, burning feeling in the bladder or urethra during urination. Older women and men are more likely to be tired, shaky, and weak and have muscle aches and abdominal pain. A fever may mean the infection is in your kidneys. Other symptoms of a kidney infection include pain in your back or sides below the ribs, nausea, and vomiting.  DIAGNOSIS  To diagnose a UTI, your caregiver will ask you about your symptoms. Your caregiver also will ask to provide a urine sample. The urine sample will be tested for bacteria and white blood cells. White blood cells are made by your body to help fight infection.  TREATMENT   Typically, UTIs can be treated with medication. Because most UTIs are caused by a bacterial infection, they usually can be treated with the use of antibiotics. The choice of antibiotic and length of treatment depend on your symptoms and the type of bacteria causing your infection.  HOME CARE INSTRUCTIONS   If you were prescribed antibiotics, take them exactly as your caregiver instructs you. Finish the medication even if you feel better after you  have only taken some of the medication.   Drink enough water and fluids to keep your urine clear or pale yellow.   Avoid caffeine, tea, and carbonated beverages. They tend to irritate your bladder.   Empty your bladder often. Avoid holding urine for long periods of time.   Empty your bladder before and after sexual intercourse.   After a bowel movement, women should cleanse from front to back. Use each tissue only once.  SEEK MEDICAL CARE IF:    You have back pain.   You develop a fever.   Your symptoms do not begin to resolve within 3 days.  SEEK IMMEDIATE MEDICAL CARE IF:    You have severe back pain or lower abdominal pain.   You develop chills.   You have nausea or vomiting.   You have continued burning or discomfort with urination.  MAKE SURE YOU:    Understand these instructions.   Will watch your condition.   Will get help right away if you are not doing well or get worse.  Document Released: 03/13/2005 Document Revised: 12/03/2011 Document Reviewed: 07/12/2011  Exit[employer] Patient Information 2014 [employer].

## 2013-01-05 NOTE — Progress Notes (Signed)
Patient is a 79 year old who was seen on May 21 complaining of vaginal irritation slight brownish discharge. She has been treated for bacterial vaginosis in the past as well as for yeast infections. Several times in the past my colleague Dr. Eda Paschal has attempted to insert the Estring but due to her cystocele which has gotten worse causes it to fall out. On exam she had been noted to have a second-degree cystocele with vaginal atrophy a superficial vaginal tears in the fourchette had been noted. A wet prep during that visit demonstrated bacterial vaginosis and she was placed on Flagyl 500 mg twice a day for 5 days which she took. We discussed for her vaginal irritation dryness to use the estradiol 0.02% twice a week.   The patient has informed me that when she uses the estrogen cream she's had vaginal bleeding and this is the reason she is in the office today along with dysuria and frequency.  She was seen in the office on June 11 whereby she had an ultrasound which had demonstrated the following:  Uterus measures 6.1 x 4.8 x 3.4 cm with endometrial stripe of 4.2 mm. Uterus anteverted with fluid-filled endometrial cavity right calcified fibroid measured 4 mm. The fluid-filled cervix with right anterior wall defect vascular flow 4.5 mm. Right ovary atrophic continued presence of solid calcification measured 7 x 9 mm avascular. Left ovary was atrophic.   Once again we discussed different treatment options she wanted to try the Estring again and we placed it into her vagina and on the way out from the exam room it fell out.  On examination there is some vaginal atrophy but no evidence of any bleeding. We decided to proceed with an endometrial biopsy after cervix was cleansed with Betadine solution. It did require cervical dilatation to allow the Pipelle to be introduced. Very little tissue was obtained and was submitted for histological evaluation.  Her wet prep was negative some bacteria some white blood  cells but her urinalysis demonstrated 2 numerous to count WBC, few bacteria, 3-6 RBC.  Assessment/plan: Patient with cystocele not surgical candidate with recent postmenopausal bleeding endometrial biopsy done result pending at time of this dictation. Patient does have vaginal atrophy could not tolerate the Estring or topical estrogen. I do believe that putting her on a combination estrogen and progesterone patch at her age carries too many risks. I recommended she use Vaseline gel when necessary. For her urinary tract infection she will be placed on Cipro 250 mg 1 by mouth twice a day for 3 days. I have also given her sample of Uribel  Anti-spasmodic agent to take 1 by mouth 3 times a day for the next 2 days.

## 2013-01-05 NOTE — Telephone Encounter (Signed)
Please inform the pharmacy that this was an error . She does not need either one of the prescriptions further Provera. Her only prescription should be for Cipro 250 mg one by mouth twice a day for 3 days

## 2013-01-05 NOTE — Telephone Encounter (Signed)
Pharmacy informed with the below note 

## 2013-01-08 ENCOUNTER — Other Ambulatory Visit: Payer: Self-pay | Admitting: Internal Medicine

## 2013-01-09 LAB — URINE CULTURE: Colony Count: 65000

## 2013-01-11 ENCOUNTER — Other Ambulatory Visit: Payer: Self-pay | Admitting: Gynecology

## 2013-01-11 MED ORDER — NITROFURANTOIN MONOHYD MACRO 100 MG PO CAPS: 100.0000 mg | ORAL_CAPSULE | Freq: Two times a day (BID) | ORAL | Status: DC

## 2013-01-18 DIAGNOSIS — N3941 Urge incontinence: Secondary | ICD-10-CM | POA: Diagnosis not present

## 2013-01-18 DIAGNOSIS — M6281 Muscle weakness (generalized): Secondary | ICD-10-CM | POA: Diagnosis not present

## 2013-01-18 DIAGNOSIS — R339 Retention of urine, unspecified: Secondary | ICD-10-CM | POA: Diagnosis not present

## 2013-01-18 DIAGNOSIS — R279 Unspecified lack of coordination: Secondary | ICD-10-CM | POA: Diagnosis not present

## 2013-01-26 ENCOUNTER — Telehealth: Payer: Self-pay | Admitting: *Deleted

## 2013-01-26 NOTE — Telephone Encounter (Signed)
Pt called c/o vaginal bleeding that was noticed yesterday not heavy bright red, some bleeding today as well. Pt asked me to relay information to you. Please advise

## 2013-01-26 NOTE — Telephone Encounter (Signed)
Pt called back and said she was taking antibiotic for UTI, and this bleeding maybe related, pt is unsure and would like any recommendations.

## 2013-01-26 NOTE — Telephone Encounter (Signed)
She can apply the estradiol vaginal cream even a small amount like I showed her twice a week and I would like to see her in the office next week

## 2013-01-26 NOTE — Telephone Encounter (Signed)
Pt informed with the below note. 

## 2013-02-02 DIAGNOSIS — H6120 Impacted cerumen, unspecified ear: Secondary | ICD-10-CM | POA: Diagnosis not present

## 2013-02-02 DIAGNOSIS — H903 Sensorineural hearing loss, bilateral: Secondary | ICD-10-CM | POA: Diagnosis not present

## 2013-02-11 DIAGNOSIS — H5700 Unspecified anomaly of pupillary function: Secondary | ICD-10-CM | POA: Diagnosis not present

## 2013-02-11 DIAGNOSIS — H02109 Unspecified ectropion of unspecified eye, unspecified eyelid: Secondary | ICD-10-CM | POA: Diagnosis not present

## 2013-02-11 DIAGNOSIS — H259 Unspecified age-related cataract: Secondary | ICD-10-CM | POA: Diagnosis not present

## 2013-02-11 DIAGNOSIS — H5200 Hypermetropia, unspecified eye: Secondary | ICD-10-CM | POA: Diagnosis not present

## 2013-02-12 ENCOUNTER — Telehealth: Payer: Self-pay | Admitting: Internal Medicine

## 2013-02-12 NOTE — Telephone Encounter (Signed)
Rectal bleeding she thinks its her hemorrhoids again. She would like to be seen.  She will come in next week at 1:30 02/16/13

## 2013-02-16 ENCOUNTER — Telehealth: Payer: Self-pay | Admitting: *Deleted

## 2013-02-16 ENCOUNTER — Ambulatory Visit (INDEPENDENT_AMBULATORY_CARE_PROVIDER_SITE_OTHER): Payer: Medicare Other | Admitting: Physician Assistant

## 2013-02-16 ENCOUNTER — Encounter: Payer: Self-pay | Admitting: Physician Assistant

## 2013-02-16 VITALS — BP 118/66 | HR 70 | Ht 59.0 in | Wt 102.0 lb

## 2013-02-16 DIAGNOSIS — K648 Other hemorrhoids: Secondary | ICD-10-CM

## 2013-02-16 DIAGNOSIS — K625 Hemorrhage of anus and rectum: Secondary | ICD-10-CM

## 2013-02-16 MED ORDER — HYDROCORTISONE ACETATE 25 MG RE SUPP: RECTAL | Status: DC

## 2013-02-16 NOTE — Telephone Encounter (Signed)
Spoke to the pharmacist, Nehemiah Settle, at [facility].  She will have the Anusol HC Suppositories and the Balmex cream delivered to the patient at her residence at [place].

## 2013-02-16 NOTE — Patient Instructions (Signed)
We sent a prescription to [facility] for the [facility] Suppositories. We will have them deliver to your residence. ( [place] ).  Get some Balmex cream also, over the counter, and use it on the perianal area.

## 2013-02-16 NOTE — Progress Notes (Signed)
Subjective:    Patient ID: [name] Small, female    DOB: 1916-11-30, 79 y.o.   MRN: [id number]  HPI  [name] Small is a pleasant 79 year old white female known to Dr. Leone Payor. She has history of remote right colon cancer for which she had a segmental colectomy in 2002. She has sigmoid diverticulosis, history of colon polyps, IBS, hypertension, and hypothyroidism. She says she was just diagnosed with Sjogren's syndrome. She has had a lot of problems over the past few years with pelvic floor dysfunction with bladder prolapse, vaginal atrophy and prolapse and has had intermittent fecal incontinence as well. She was last seen here in April of 2013 and had some rectal bleeding from prolapsed internal hemorrhoids. Her last procedure was done in 2006/colonoscopy by Dr. Terrial Rhodes. She had EGD in 2009 per Dr. Leone Payor showing an antral gastritis She comes in today with concerns about rectal bleeding. She had 2 episodes last week with bright red blood on the tissue which she noted after straining for urination. She says the blood was not coming from her urinary tract or the vagina but from her rectum. She's had no rectal pain. She has not had any recent changes in her bowel habits denies any significant problems with constipation etc. She said she had one further episode of bright red blood later in the week and has not seen any since. She used Anusol suppositories for about 4 days. He has not seen any blood mixed in with her bowel movements nor any melena. She denies any abdominal pain. She says she know she is very old but it's frustrating because everything is prolapsing.     Review of Systems  Constitutional: Negative.   HENT: Negative.   Eyes: Negative.   Respiratory: Negative.   Cardiovascular: Negative.   Gastrointestinal: Positive for anal bleeding.  Endocrine: Negative.   Genitourinary: Positive for difficulty urinating.  Musculoskeletal: Negative.   Neurological: Negative.   Hematological:  Negative.   Psychiatric/Behavioral: Negative.    Outpatient Prescriptions Prior to Visit  Medication Sig Dispense Refill  . Alpha-D-Galactosidase (BEANO PO) Take 2 tablets by mouth every morning before fiber intake and every evening      . ALPRAZolam (XANAX) 0.25 MG tablet Take 1 tablet (0.25 mg total) by mouth every 6 (six) hours as needed for anxiety.  100 tablet  3  . calcium carbonate (TUMS) 500 MG chewable tablet Chew 1 tablet by mouth as needed.       . Diphenhyd-Hydrocort-Nystatin SUSP 5 ml by mouth swish and spit four times per day as needed  240 mL  2  . fluticasone (FLONASE) 50 MCG/ACT nasal spray TAKE AS NEEDED  16 g  1  . hydrocortisone valerate ointment (WEST-CORT) 0.2 % Apply topically 2 (two) times daily. Apply 2 times a day for one week. When the week is finished apply Vaseline to the area 2 times a day indefinitely.  You can use the [place] cream once a week after the week treatment  45 g  3  . Lactobacillus (REPHRESH PRO-B) CAPS Take 1 by mouth every day       . levothyroxine (SYNTHROID, LEVOTHROID) 75 MCG tablet TAKE 1 TABLET (75 MCG TOTAL)   BY MOUTH DAILY.  30 tablet  9  . Multiple Vitamin (MULTIVITAMIN) tablet Take 1 tablet by mouth daily.        . Multiple Vitamins-Minerals (ICAPS) CAPS Take 2 capsules by mouth.      . nitrofurantoin, macrocrystal-monohydrate, (MACROBID) 100 MG capsule Take 1  capsule (100 mg total) by mouth 2 (two) times daily.  14 capsule  0  . polyethylene glycol powder (MIRALAX) powder Take 17 g by mouth daily.        . Probiotic Product (ALIGN) 4 MG CAPS Take 1 tablet by mouth daily.        Marland Kitchen PROCTOCREAM-HC 2.5 % rectal cream APPLY TO RECTUM TWICE A DAY AS NEEDED.  30 g  3  . ranitidine (ZANTAC) 150 MG tablet Take 150 mg by mouth 2 (two) times daily.      Marland Kitchen zolpidem (AMBIEN) 5 MG tablet Take 1 tablet (5 mg total) by mouth at bedtime as needed.  30 tablet  0  . hydrocortisone (ANUSOL-HC) 25 MG suppository INSERT 1 SUPPOSITORY RECTALLY 2 TIMES A DAY AS  DIRECTED.  12 suppository  0  . calcium carbonate (OS-CAL) 600 MG TABS Take 600 mg by mouth daily.        . ciprofloxacin (CIPRO) 250 MG tablet Take 1 tablet (250 mg total) by mouth 2 (two) times daily.  6 tablet  0  . fluticasone (FLONASE) 50 MCG/ACT nasal spray TAKE AS NEEDED  16 g  2  . metroNIDAZOLE (FLAGYL) 500 MG tablet Take 1 tablet (500 mg total) by mouth 2 (two) times daily.  10 tablet  0  . OVER THE COUNTER MEDICATION Take by mouth 2 (two) times daily. Supreme Doliphus.      . sulfamethoxazole-trimethoprim (BACTRIM DS) 800-160 MG per tablet        No facility-administered medications prior to visit.   Allergies  Allergen Reactions  . Amoxil [Amoxicillin] Diarrhea  . Crab [Shellfish Allergy]     sensitivity  . Demerol   . Meperidine Hcl Other (See Comments)    Drop in blood pressure  . Neosporin [Neomycin-Bacitracin Zn-Polymyx]   . Infed [Iron Dextran] Rash    Became very red over face, scalp, and arms   Patient Active Problem List   Diagnosis Date Noted  . Bilateral dry eyes 10/27/2012  . Dry mouth 10/27/2012  . Speech difficult to understand 10/27/2012  . Advanced care planning/counseling discussion 07/15/2012  . Abrasions of multiple sites 10/09/2011  . Knee pain, bilateral 10/09/2011  . Neuropathy   . HTN (hypertension)   . Carotid bruit   . PERSONAL HX COLONIC POLYPS 08/08/2010  . Female bladder prolapse, acquired 07/17/2010  . VAGINAL BLEEDING, ABNORMAL 06/28/2010  . OSTEOARTHRITIS, HIP, LEFT 06/28/2010  . Full incontinence of feces 03/23/2010  . VARICOSE VEINS, LOWER EXTREMITIES 01/06/2009  . ANEMIA, NORMOCYTIC, CHRONIC 12/03/2007  . VAGINITIS, ATROPHIC 12/03/2007  . Idiopathic osteoporosis 12/03/2007  . Melanoma of skin, site unspecified 07/13/2007  . Hemorrhoids, internal, with bleeding and prolapse 06/12/2007  . Irritable bowel syndrome 06/12/2007  . HYPOTHYROIDISM 02/25/2007  . HYPERLIPIDEMIA 02/25/2007  . NEUROPATHY, IDIOPATHIC PERIPHERAL 02/25/2007   . GASTROESOPHAGEAL REFLUX DISEASE 02/25/2007  . SPINAL STENOSIS 02/25/2007  . ADENOCARCINOMA, COLON, HX OF 02/25/2007  . DIVERTICULOSIS, COLON, HX OF 02/25/2007   History  Substance Use Topics  . Smoking status: Former Games developer  . Smokeless tobacco: Never Used  . Alcohol Use: 1.0 oz/week    2 drink(s) per week     Comment: occas wine 2 glasses a week   family history includes Cancer in her maternal grandfather; Diabetes in her father; Heart disease in her father; Stomach cancer in her maternal grandfather. There is no history of Colon cancer.     Objective:   Physical Exam well-developed elderly white female in no  acute distress, blood pressure 118/66 pulse 70 height 4 foot 11 weight 102. HEENT; nontraumatic normocephalic EOMI PERRLA sclera anicteric, Supple; no JVD, Cardiovascular; regular rate and rhythm with S1-S2 no murmur or gallop, Pulmonary; clear bilaterally, Abdomen ;soft nontender nondistended bowel sounds are active there is no palpable mass or hepatosplenomegaly, Rectal ; no external lesion noted or fissure, she is nontender on exam. On anoscopy she has a friable internal hemorrhoid with some oozing of blood. She has confluent erythema of the perianal and perineal skin. Extremities; no clubbing cyanosis or edema skin warm and dry, Psych ;mood and affect normal and appropriate         Assessment & Plan:  #35 79 year old white female with intermittent low-grade hematochezia secondary to internal hemorrhoids #2 perianal and perineal erythema #3 remote history of colon cancer 2002-last colonoscopy done 2006, one 3 mm polyp noted #4 diverticulosis #5 history of bladder prolapse #6 Sjgren syndrome #7 hypertension #8 hypothyroidism  Plan-CBC today Start Anusol-HC suppositories at bedtime x10 days then as needed she is advised to take a course x7 days when she has recurrent symptoms and then stop Trial of Balmex cream for external perineal and perianal erythema She will  followup with Dr. Leone Payor as needed

## 2013-02-17 NOTE — Progress Notes (Signed)
Agree with Ms. Oswald Hillock assessment and plan. Iva Boop, MD, [facility]  The patient has had recurrent hemorrhoidal bleeding but the family and I have previously decided not to intervene with procedures.

## 2013-02-18 DIAGNOSIS — R339 Retention of urine, unspecified: Secondary | ICD-10-CM | POA: Diagnosis not present

## 2013-02-24 ENCOUNTER — Encounter: Payer: Self-pay | Admitting: Cardiology

## 2013-02-24 ENCOUNTER — Ambulatory Visit (INDEPENDENT_AMBULATORY_CARE_PROVIDER_SITE_OTHER): Payer: Medicare Other | Admitting: Cardiology

## 2013-02-24 VITALS — BP 122/60 | HR 69 | Ht 59.0 in | Wt 101.0 lb

## 2013-02-24 DIAGNOSIS — I10 Essential (primary) hypertension: Secondary | ICD-10-CM | POA: Diagnosis not present

## 2013-02-24 DIAGNOSIS — I8390 Asymptomatic varicose veins of unspecified lower extremity: Secondary | ICD-10-CM

## 2013-02-24 DIAGNOSIS — I8393 Asymptomatic varicose veins of bilateral lower extremities: Secondary | ICD-10-CM

## 2013-02-24 NOTE — Progress Notes (Signed)
HPI  The patient is seen for overall cardiology followup. She is 41 and still active. Mentally she is still fully alert and appropriate. She came in with her walker today. She's not having any chest pain or shortness of breath. There has been no syncope or presyncope.      She is concerned because of some varicose veins. She is wearing support hose on her thin legs and this is all that she needs.  Allergies  Allergen Reactions  . Amoxil [Amoxicillin] Diarrhea  . Crab [Shellfish Allergy]     sensitivity  . Demerol   . Meperidine Hcl Other (See Comments)    Drop in blood pressure  . Neosporin [Neomycin-Bacitracin Zn-Polymyx]   . Infed [Iron Dextran] Rash    Became very red over face, scalp, and arms    Current Outpatient Prescriptions  Medication Sig Dispense Refill  . Alpha-D-Galactosidase (BEANO PO) Take 2 tablets by mouth every morning before fiber intake and every evening      . ALPRAZolam (XANAX) 0.25 MG tablet Take 1 tablet (0.25 mg total) by mouth every 6 (six) hours as needed for anxiety.  100 tablet  3  . calcium carbonate (TUMS) 500 MG chewable tablet Chew 1 tablet by mouth as needed.       . Diphenhyd-Hydrocort-Nystatin SUSP 5 ml by mouth swish and spit four times per day as needed  240 mL  2  . fluticasone (FLONASE) 50 MCG/ACT nasal spray TAKE AS NEEDED  16 g  1  . hydrocortisone (ANUSOL-HC) 25 MG suppository Use 1 suppository at bedtime for 10 days.  10 suppository  2  . hydrocortisone valerate ointment (WEST-CORT) 0.2 % Apply topically 2 (two) times daily. Apply 2 times a day for one week. When the week is finished apply Vaseline to the area 2 times a day indefinitely.  You can use the Westcort cream once a week after the week treatment  45 g  3  . Lactobacillus (REPHRESH PRO-B) CAPS Take 1 by mouth every day       . levothyroxine (SYNTHROID, LEVOTHROID) 75 MCG tablet TAKE 1 TABLET (75 MCG TOTAL)   BY MOUTH DAILY.  30 tablet  9  . Multiple Vitamins-Minerals (ICAPS AREDS  FORMULA PO) Take by mouth.      . polyethylene glycol powder (MIRALAX) powder Take 17 g by mouth daily.        Marland Kitchen PRESCRIPTION MEDICATION Takes compound for 10 days. Per Dr Lily Peer      . PRESCRIPTION MEDICATION suppository for 10 days. Per Dr Lily Peer      . Probiotic Product (ALIGN) 4 MG CAPS Take 1 tablet by mouth daily.        Marland Kitchen PROCTOCREAM-HC 2.5 % rectal cream APPLY TO RECTUM TWICE A DAY AS NEEDED.  30 g  3  . ranitidine (ZANTAC) 150 MG tablet Take 150 mg by mouth 2 (two) times daily.      Marland Kitchen zolpidem (AMBIEN) 5 MG tablet Take 1 tablet (5 mg total) by mouth at bedtime as needed.  30 tablet  0   No current facility-administered medications for this visit.    History   Social History  . Marital Status: Widowed    Spouse Name: N/A    Number of Children: 2  . Years of Education: N/A   Occupational History  . retired    Social History Main Topics  . Smoking status: Former Games developer  . Smokeless tobacco: Never Used  . Alcohol Use: 1.0 oz/week  2 drink(s) per week     Comment: occas wine 2 glasses a week  . Drug Use: No  . Sexual Activity: No   Other Topics Concern  . Not on file   Social History Narrative   widowed after a very long and happy marriage almost 60 years. 2 sons - one a Nurse, learning disability, several grandchildren. Close and attentive family. Lives alone at [facility]: paricipates in yoga and exercise. I-ADLs including driving. Supportive family who checks on her and visits her regularly.      End of Life Care: She clearly states she does not want CPR (out of facility order signed November 26, 2010). Reviewed with her MOST - she would not want prolonged intensive care, mechanical ventilation or prolonged artificial fluids or nutrition. Provided unsigned MOST form for her to review.     Family History  Problem Relation Age of Onset  . Stomach cancer Maternal Grandfather   . Cancer Maternal Grandfather     stomach  . Colon cancer Neg Hx   . Heart disease Father    . Diabetes Father     Past Medical History  Diagnosis Date  . Prolapsed urethral mucosa   . Osteoarthrosis, unspecified whether generalized or localized, pelvic region and thigh   . Full incontinence of feces   . Asymptomatic varicose veins   . Anemia, unspecified   . Idiopathic osteoporosis   . Melanoma of skin, site unspecified   . Gastroenteritis   . Iron deficiency anemia, unspecified   . Irritable bowel syndrome   . Internal hemorrhoids without mention of complication   . Carotid bruit     hx of  . Neuropathy     peripheral  . HTN (hypertension)   . Esophageal reflux   . Diverticulosis   . Dyslipidemia   . Spinal stenosis, unspecified region other than cervical   . Hypothyroidism   . Colon adenocarcinoma     hx of  . Fecal incontinence   . Melanoma   . MVP (mitral valve prolapse)   . Female bladder prolapse, acquired 07/17/2010  . Vaginal bleeding, abnormal   . Varicose veins   . Idiopathic osteoporosis   . Vaginitis, atrophic   . Fibroid   . Carotid bruit     doppler normal in the past  . Hemorrhoid   . OA (osteoarthritis)     left hip    Past Surgical History  Procedure Laterality Date  . Hemicolectomy      right  . Colonoscopy w/ polypectomy  2006    3 mm polyp destroyed, diverticulosis  . Esophagogastroduodenoscopy  2009    mild gastritis  . Hemorrhoid surgery    . Hysteroscopy    . Dilation and curettage of uterus    . Melanoma excision      Patient Active Problem List   Diagnosis Date Noted  . Neuropathy     Priority: High  . HTN (hypertension)     Priority: High  . Carotid bruit     Priority: High  . Bilateral dry eyes 10/27/2012  . Dry mouth 10/27/2012  . Speech difficult to understand 10/27/2012  . Advanced care planning/counseling discussion 07/15/2012  . Abrasions of multiple sites 10/09/2011  . Knee pain, bilateral 10/09/2011  . PERSONAL HX COLONIC POLYPS 08/08/2010  . Female bladder prolapse, acquired 07/17/2010  . VAGINAL  BLEEDING, ABNORMAL 06/28/2010  . OSTEOARTHRITIS, HIP, LEFT 06/28/2010  . Full incontinence of feces 03/23/2010  . VARICOSE VEINS, LOWER EXTREMITIES 01/06/2009  .  ANEMIA, NORMOCYTIC, CHRONIC 12/03/2007  . VAGINITIS, ATROPHIC 12/03/2007  . Idiopathic osteoporosis 12/03/2007  . Melanoma of skin, site unspecified 07/13/2007  . Hemorrhoids, internal, with bleeding and prolapse 06/12/2007  . Irritable bowel syndrome 06/12/2007  . HYPOTHYROIDISM 02/25/2007  . HYPERLIPIDEMIA 02/25/2007  . NEUROPATHY, IDIOPATHIC PERIPHERAL 02/25/2007  . GASTROESOPHAGEAL REFLUX DISEASE 02/25/2007  . SPINAL STENOSIS 02/25/2007  . ADENOCARCINOMA, COLON, HX OF 02/25/2007  . DIVERTICULOSIS, COLON, HX OF 02/25/2007    ROS   Patient denies fever, chills, headache, sweats, rash, change in vision, change in hearing, chest pain, cough, nausea vomiting, urinary symptoms. All other systems are reviewed and are negative.  PHYSICAL EXAM   Patient is oriented to person time and place. Affect is normal. There is no jugulovenous distention. Lungs are clear. Respiratory effort is nonlabored. Cardiac exam reveals S1 and S2. There no clicks or significant murmurs. The abdomen is soft. There is no peripheral edema.  Filed Vitals:   02/24/13 1347  BP: 122/60  Pulse: 69  Height: 4\' 11"  (1.499 m)  Weight: 101 lb (45.813 kg)   EKG is done today and reviewed by me. There is normal sinus rhythm. There is left axis deviation. There is no significant change.  ASSESSMENT & PLAN

## 2013-02-24 NOTE — Assessment & Plan Note (Signed)
She has some varicose veins. She is wearing some support hose. I certainly encouraged this. I also encouraged her ongoing exercise.  Overall she looks great. No change in therapy.

## 2013-02-24 NOTE — Assessment & Plan Note (Signed)
Blood pressure is controlled. No change in therapy. 

## 2013-02-24 NOTE — Patient Instructions (Addendum)
Your physician recommends that you continue on your current medications as directed. Please refer to the Current Medication list given to you today.  Your physician wants you to follow-up in: 1 year. You will receive a reminder letter in the mail two months in advance. If you don't receive a letter, please call our office to schedule the follow-up appointment.  

## 2013-03-02 DIAGNOSIS — R339 Retention of urine, unspecified: Secondary | ICD-10-CM | POA: Diagnosis not present

## 2013-03-02 DIAGNOSIS — R3911 Hesitancy of micturition: Secondary | ICD-10-CM | POA: Diagnosis not present

## 2013-03-02 DIAGNOSIS — R300 Dysuria: Secondary | ICD-10-CM | POA: Diagnosis not present

## 2013-03-03 ENCOUNTER — Telehealth: Payer: Self-pay | Admitting: Physician Assistant

## 2013-03-03 DIAGNOSIS — K625 Hemorrhage of anus and rectum: Secondary | ICD-10-CM

## 2013-03-03 NOTE — Telephone Encounter (Signed)
Patient was not sure if she was to have labs done at [facility] or not. Please, advise. She is also wanting to let Mike Gip, PA know that the suppositories were $25 last year for #10 and now they are $198.

## 2013-03-04 ENCOUNTER — Other Ambulatory Visit (INDEPENDENT_AMBULATORY_CARE_PROVIDER_SITE_OTHER): Payer: Medicare Other

## 2013-03-04 DIAGNOSIS — K625 Hemorrhage of anus and rectum: Secondary | ICD-10-CM | POA: Diagnosis not present

## 2013-03-04 LAB — CBC WITH DIFFERENTIAL/PLATELET
Basophils Relative: 0.3 % (ref 0.0–3.0)
Eosinophils Absolute: 0.1 10*3/uL (ref 0.0–0.7)
Eosinophils Relative: 2 % (ref 0.0–5.0)
Lymphocytes Relative: 19.6 % (ref 12.0–46.0)
MCHC: 34 g/dL (ref 30.0–36.0)
MCV: 92.3 fl (ref 78.0–100.0)
Monocytes Absolute: 0.4 10*3/uL (ref 0.1–1.0)
Neutrophils Relative %: 71 % (ref 43.0–77.0)
Platelets: 184 10*3/uL (ref 150.0–400.0)
RBC: 4.01 Mil/uL (ref 3.87–5.11)
WBC: 6.1 10*3/uL (ref 4.5–10.5)

## 2013-03-04 NOTE — Telephone Encounter (Signed)
Spoke with patient and she will come for labs. 

## 2013-03-04 NOTE — Telephone Encounter (Signed)
I had wantde her to get a cbc done because of the rectal bleeding

## 2013-03-16 DIAGNOSIS — H6120 Impacted cerumen, unspecified ear: Secondary | ICD-10-CM | POA: Diagnosis not present

## 2013-03-16 DIAGNOSIS — H903 Sensorineural hearing loss, bilateral: Secondary | ICD-10-CM | POA: Diagnosis not present

## 2013-03-30 DIAGNOSIS — R3911 Hesitancy of micturition: Secondary | ICD-10-CM | POA: Diagnosis not present

## 2013-03-30 DIAGNOSIS — R339 Retention of urine, unspecified: Secondary | ICD-10-CM | POA: Diagnosis not present

## 2013-04-05 ENCOUNTER — Encounter: Payer: Self-pay | Admitting: Internal Medicine

## 2013-04-05 ENCOUNTER — Ambulatory Visit (INDEPENDENT_AMBULATORY_CARE_PROVIDER_SITE_OTHER): Payer: Medicare Other | Admitting: Internal Medicine

## 2013-04-05 VITALS — BP 132/66 | HR 68 | Temp 97.2°F | Wt 101.0 lb

## 2013-04-05 DIAGNOSIS — K648 Other hemorrhoids: Secondary | ICD-10-CM | POA: Diagnosis not present

## 2013-04-05 MED ORDER — HYDROCORTISONE 2.5 % RE CREA: TOPICAL_CREAM | RECTAL | Status: DC

## 2013-04-05 MED ORDER — FLUTICASONE PROPIONATE 50 MCG/ACT NA SUSP: NASAL | Status: DC

## 2013-04-05 NOTE — Assessment & Plan Note (Signed)
Recent hemorrhoidal bleed 2/2 increased intra-abdominal pressure with straining to void.  Plan Recommend standing to void, do not strain  Renewed Rx for annusol HC cream

## 2013-04-05 NOTE — Progress Notes (Signed)
Subjective:    Patient ID: [name] Small, female    DOB: [birth year], 79 y.o.   MRN: [id number]  HPI [name] Small presents for increased bladder retention - she had a bad week and went to Urology office. Her symptoms did improve. She was seen by extender: had bladder scan that revealed less retention than usual. She reports that she does have rectal bleeding when she pushed, manually and with intrabdominal pressure, to micturate.This is not recurring.  Past Medical History  Diagnosis Date  . Prolapsed urethral mucosa   . Osteoarthrosis, unspecified whether generalized or localized, pelvic region and thigh   . Full incontinence of feces   . Asymptomatic varicose veins   . Anemia, unspecified   . Idiopathic osteoporosis   . Melanoma of skin, site unspecified   . Gastroenteritis   . Iron deficiency anemia, unspecified   . Irritable bowel syndrome   . Internal hemorrhoids without mention of complication   . Carotid bruit     hx of  . Neuropathy     peripheral  . HTN (hypertension)   . Esophageal reflux   . Diverticulosis   . Dyslipidemia   . Spinal stenosis, unspecified region other than cervical   . Hypothyroidism   . Colon adenocarcinoma     hx of  . Fecal incontinence   . Melanoma   . MVP (mitral valve prolapse)   . Female bladder prolapse, acquired 07/17/2010  . Vaginal bleeding, abnormal   . Varicose veins   . Idiopathic osteoporosis   . Vaginitis, atrophic   . Fibroid   . Carotid bruit     doppler normal in the past  . Hemorrhoid   . OA (osteoarthritis)     left hip   Past Surgical History  Procedure Laterality Date  . Hemicolectomy      right  . Colonoscopy w/ polypectomy  2006    3 mm polyp destroyed, diverticulosis  . Esophagogastroduodenoscopy  2009    mild gastritis  . Hemorrhoid surgery    . Hysteroscopy    . Dilation and curettage of uterus    . Melanoma excision     Family History  Problem Relation Age of Onset  . Stomach cancer Maternal  Grandfather   . Cancer Maternal Grandfather     stomach  . Colon cancer Neg Hx   . Heart disease Father   . Diabetes Father    History   Social History  . Marital Status: Widowed    Spouse Name: N/A    Number of Children: 2  . Years of Education: N/A   Occupational History  . retired    Social History Main Topics  . Smoking status: Former Games developer  . Smokeless tobacco: Never Used  . Alcohol Use: 1.0 oz/week    2 drink(s) per week     Comment: occas wine 2 glasses a week  . Drug Use: No  . Sexual Activity: No   Other Topics Concern  . Not on file   Social History Narrative   widowed after a very long and happy marriage almost 60 years. 2 sons - one a Nurse, learning disability, several grandchildren. Close and attentive family. Lives alone at [facility]: paricipates in yoga and exercise. I-ADLs including driving. Supportive family who checks on her and visits her regularly.      End of Life Care: She clearly states she does not want CPR (out of facility order signed November 26, 2010). Reviewed with her MOST - she would  not want prolonged intensive care, mechanical ventilation or prolonged artificial fluids or nutrition. Provided unsigned MOST form for her to review.     Current Outpatient Prescriptions on File Prior to Visit  Medication Sig Dispense Refill  . Alpha-D-Galactosidase (BEANO PO) Take 2 tablets by mouth every morning before fiber intake and every evening      . ALPRAZolam (XANAX) 0.25 MG tablet Take 1 tablet (0.25 mg total) by mouth every 6 (six) hours as needed for anxiety.  100 tablet  3  . calcium carbonate (TUMS) 500 MG chewable tablet Chew 1 tablet by mouth as needed.       . Diphenhyd-Hydrocort-Nystatin SUSP 5 ml by mouth swish and spit four times per day as needed  240 mL  2  . hydrocortisone (ANUSOL-HC) 25 MG suppository Use 1 suppository at bedtime for 10 days.  10 suppository  2  . hydrocortisone valerate ointment (WEST-CORT) 0.2 % Apply topically 2 (two) times  daily. Apply 2 times a day for one week. When the week is finished apply Vaseline to the area 2 times a day indefinitely.  You can use the Westcort cream once a week after the week treatment  45 g  3  . Lactobacillus (REPHRESH PRO-B) CAPS Take 1 by mouth every day       . levothyroxine (SYNTHROID, LEVOTHROID) 75 MCG tablet TAKE 1 TABLET (75 MCG TOTAL)   BY MOUTH DAILY.  30 tablet  9  . Multiple Vitamins-Minerals (ICAPS AREDS FORMULA PO) Take by mouth.      . polyethylene glycol powder (MIRALAX) powder Take 17 g by mouth daily.        Marland Kitchen PRESCRIPTION MEDICATION Takes compound for 10 days. Per Dr Lily Peer      . PRESCRIPTION MEDICATION suppository for 10 days. Per Dr Lily Peer      . Probiotic Product (ALIGN) 4 MG CAPS Take 1 tablet by mouth daily.        . ranitidine (ZANTAC) 150 MG tablet Take 150 mg by mouth 2 (two) times daily.      Marland Kitchen zolpidem (AMBIEN) 5 MG tablet Take 1 tablet (5 mg total) by mouth at bedtime as needed.  30 tablet  0   No current facility-administered medications on file prior to visit.      Review of Systems System review is negative for any constitutional, cardiac, pulmonary, GI or neuro symptoms or complaints other than as described in the HPI.     Objective:   Physical Exam Filed Vitals:   04/05/13 1406  BP: 132/66  Pulse: 68  Temp: 97.2 F (36.2 C)   Wt Readings from Last 3 Encounters:  04/05/13 101 lb (45.813 kg)  02/24/13 101 lb (45.813 kg)  02/16/13 102 lb (46.267 kg)   Gen'l- very elderly woman, bright and chipper but very dry mm Cor- RRR PUlm - normal respirations       Assessment & Plan:

## 2013-04-05 NOTE — Patient Instructions (Signed)
Rectal bleeding - the association between pushing to void and rectal bleeding is the increase in intrabdominal pressure with straining to void leading to increased intravenous pressure including the rectal veins that then leads to a sudden short lived internal hemorrhoidal  Bleed. With two self-limited episodes there is not a need for any other studies.   In terms of what to do for regulation of bladder function I agree with drinking a lot of fluid. The risk of bladder incontinence is better than the risk of bladder retention.

## 2013-04-06 ENCOUNTER — Encounter: Payer: Self-pay | Admitting: Gynecology

## 2013-04-06 ENCOUNTER — Ambulatory Visit (INDEPENDENT_AMBULATORY_CARE_PROVIDER_SITE_OTHER): Payer: Medicare Other | Admitting: Gynecology

## 2013-04-06 VITALS — BP 124/72

## 2013-04-06 DIAGNOSIS — Z23 Encounter for immunization: Secondary | ICD-10-CM | POA: Diagnosis not present

## 2013-04-06 DIAGNOSIS — N8111 Cystocele, midline: Secondary | ICD-10-CM

## 2013-04-06 DIAGNOSIS — N952 Postmenopausal atrophic vaginitis: Secondary | ICD-10-CM | POA: Diagnosis not present

## 2013-04-06 DIAGNOSIS — N762 Acute vulvitis: Secondary | ICD-10-CM

## 2013-04-06 DIAGNOSIS — N760 Acute vaginitis: Secondary | ICD-10-CM

## 2013-04-06 MED ORDER — MUPIROCIN 2 % EX OINT: TOPICAL_OINTMENT | Freq: Three times a day (TID) | CUTANEOUS | Status: DC

## 2013-04-06 NOTE — Progress Notes (Signed)
Patient presented to the office today for followup as a result of her vaginal atrophy and vulvar irritation. Her history is as follows:  79 year old who was seen on May 21 complaining of vaginal irritation slight brownish discharge. She has been treated for bacterial vaginosis in the past as well as for yeast infections. Several times in the past my colleague Dr. Eda Paschal has attempted to insert the Estring but due to her cystocele which has gotten worse causes it to fall out. On exam she had been noted to have a second-degree cystocele with vaginal atrophy a superficial vaginal tears in the fourchette had been noted. A wet prep during that visit demonstrated bacterial vaginosis and she was placed on Flagyl 500 mg twice a day for 5 days which she took. We discussed for her vaginal irritation dryness to use the estradiol 0.02% twice a week. She states that she is not having irritation feels moisture now inside her vagina.  The patient several months ago has stated that she had noted some light vaginal bleeding and she was seen in the office on July 22 and had an ultrasound the following findings: Uterus measures 6.1 x 4.8 x 3.4 cm with endometrial stripe of 4.2 mm. Uterus anteverted with fluid-filled endometrial cavity right calcified fibroid measured 4 mm. The fluid-filled cervix with right anterior wall defect vascular flow 4.5 mm. Right ovary atrophic continued presence of solid calcification measured 7 x 9 mm avascular. Left ovary was atrophic.  Examination did not demonstrate any source of bleeding patient had undergone an endometrial biopsy which was benign.  The patient with cystocele not surgical candidate due to a an multiple comorbidities. She is doing well on the vaginal estrogen cream twice a week. There appears to be vulvar irritation and redness she denies due to and stress incontinence. In the event of a mild strep infection of the vulva I'm going to place her on Bactroban cream to apply  externally 2-3 times a day for the next 10-14 days. She will continue her vaginal estrogen cream twice a week. She reports no further bleeding.

## 2013-04-27 DIAGNOSIS — H903 Sensorineural hearing loss, bilateral: Secondary | ICD-10-CM | POA: Diagnosis not present

## 2013-04-27 DIAGNOSIS — H6120 Impacted cerumen, unspecified ear: Secondary | ICD-10-CM | POA: Diagnosis not present

## 2013-06-02 DIAGNOSIS — N368 Other specified disorders of urethra: Secondary | ICD-10-CM | POA: Diagnosis not present

## 2013-06-02 DIAGNOSIS — M629 Disorder of muscle, unspecified: Secondary | ICD-10-CM | POA: Diagnosis not present

## 2013-06-02 DIAGNOSIS — R339 Retention of urine, unspecified: Secondary | ICD-10-CM | POA: Diagnosis not present

## 2013-06-02 DIAGNOSIS — N952 Postmenopausal atrophic vaginitis: Secondary | ICD-10-CM | POA: Diagnosis not present

## 2013-06-03 ENCOUNTER — Ambulatory Visit: Payer: Medicare Other | Admitting: Internal Medicine

## 2013-06-04 ENCOUNTER — Encounter: Payer: Self-pay | Admitting: Internal Medicine

## 2013-06-04 ENCOUNTER — Ambulatory Visit (INDEPENDENT_AMBULATORY_CARE_PROVIDER_SITE_OTHER): Payer: Medicare Other | Admitting: Internal Medicine

## 2013-06-04 ENCOUNTER — Ambulatory Visit (INDEPENDENT_AMBULATORY_CARE_PROVIDER_SITE_OTHER): Payer: Medicare Other

## 2013-06-04 VITALS — BP 144/76 | HR 80 | Temp 99.3°F | Wt 100.4 lb

## 2013-06-04 DIAGNOSIS — N8111 Cystocele, midline: Secondary | ICD-10-CM | POA: Diagnosis not present

## 2013-06-04 DIAGNOSIS — D649 Anemia, unspecified: Secondary | ICD-10-CM | POA: Diagnosis not present

## 2013-06-04 DIAGNOSIS — K117 Disturbances of salivary secretion: Secondary | ICD-10-CM | POA: Diagnosis not present

## 2013-06-04 DIAGNOSIS — R682 Dry mouth, unspecified: Secondary | ICD-10-CM

## 2013-06-04 DIAGNOSIS — N8110 Cystocele, unspecified: Secondary | ICD-10-CM

## 2013-06-04 DIAGNOSIS — K219 Gastro-esophageal reflux disease without esophagitis: Secondary | ICD-10-CM

## 2013-06-04 LAB — IBC PANEL
Iron: 65 ug/dL (ref 42–145)
Saturation Ratios: 14.5 % — ABNORMAL LOW (ref 20.0–50.0)
Transferrin: 320.2 mg/dL (ref 212.0–360.0)

## 2013-06-04 MED ORDER — BIOTENE DRY MOUTH MT LIQD: 15.0000 mL | OROMUCOSAL | Status: DC | PRN

## 2013-06-04 NOTE — Patient Instructions (Signed)
1. Dry mouth - a continuing problem. It is interesting that Dr. Jonny Ruiz thought of Sjogren's but we have no laboratory confirmation. Plan Lab work today.  Use the saliva substitute from Dr. Patsi Sears.   Sugar free hard candies  Excellent dental hygiene.  If th saliva substitute doesn't work we can try pilocarpine  2. Reflux and GERd - this can certainly be a cause of hoarseness even if there is no burning pain. Plan Resume Prilosec 20 mg every AM - before breakfast.  3. Urinary retention - sounds like this is a anatomic problem. This is a difficult problem with the prolapse, etc. Plan  Per Dr. Norton Blizzard may be helpful.

## 2013-06-04 NOTE — Progress Notes (Signed)
Pre visit review using our clinic review tool, if applicable. No additional management support is needed unless otherwise documented below in the visit note. 

## 2013-06-04 NOTE — Progress Notes (Signed)
Subjective:    Patient ID: [name] Small, female    DOB: 1916/10/11, 79 y.o.   MRN: [id number]  HPI Mrs. [name] Small presents to discuss xerostomia and new product from Dr. Lora Havens; She is having speech therapy due to phonation difficulty 2/2 dry mouth. She c/o chronic laryngitis which she attributes to GERD. She does have mild dysphagia 2/2 xerostomia. She does continue to see Dr. Karie Schwalbe for urinary retention.  Past Medical History  Diagnosis Date  . Prolapsed urethral mucosa   . Osteoarthrosis, unspecified whether generalized or localized, pelvic region and thigh   . Full incontinence of feces   . Asymptomatic varicose veins   . Anemia, unspecified   . Idiopathic osteoporosis   . Melanoma of skin, site unspecified   . Gastroenteritis   . Iron deficiency anemia, unspecified   . Irritable bowel syndrome   . Internal hemorrhoids without mention of complication   . Carotid bruit     hx of  . Neuropathy     peripheral  . HTN (hypertension)   . Esophageal reflux   . Diverticulosis   . Dyslipidemia   . Spinal stenosis, unspecified region other than cervical   . Hypothyroidism   . Colon adenocarcinoma     hx of  . Fecal incontinence   . Melanoma   . MVP (mitral valve prolapse)   . Female bladder prolapse, acquired 07/17/2010  . Vaginal bleeding, abnormal   . Varicose veins   . Idiopathic osteoporosis   . Vaginitis, atrophic   . Fibroid   . Carotid bruit     doppler normal in the past  . Hemorrhoid   . OA (osteoarthritis)     left hip   Past Surgical History  Procedure Laterality Date  . Hemicolectomy      right  . Colonoscopy w/ polypectomy  2006    3 mm polyp destroyed, diverticulosis  . Esophagogastroduodenoscopy  2009    mild gastritis  . Hemorrhoid surgery    . Hysteroscopy    . Dilation and curettage of uterus    . Melanoma excision     Family History  Problem Relation Age of Onset  . Stomach cancer Maternal Grandfather   . Cancer Maternal Grandfather      stomach  . Colon cancer Neg Hx   . Heart disease Father   . Diabetes Father    History   Social History  . Marital Status: Widowed    Spouse Name: N/A    Number of Children: 2  . Years of Education: N/A   Occupational History  . retired    Social History Main Topics  . Smoking status: Former Games developer  . Smokeless tobacco: Never Used  . Alcohol Use: 1.0 oz/week    2 drink(s) per week     Comment: occas wine 2 glasses a week  . Drug Use: No  . Sexual Activity: No   Other Topics Concern  . Not on file   Social History Narrative   widowed after a very long and happy marriage almost 60 years. 2 sons - one a Nurse, learning disability, several grandchildren. Close and attentive family. Lives alone at [facility]: paricipates in yoga and exercise. I-ADLs including driving. Supportive family who checks on her and visits her regularly.      End of Life Care: She clearly states she does not want CPR (out of facility order signed November 26, 2010). Reviewed with her MOST - she would not want prolonged intensive care, mechanical  ventilation or prolonged artificial fluids or nutrition. Provided unsigned MOST form for her to review.     Current Outpatient Prescriptions on File Prior to Visit  Medication Sig Dispense Refill  . Alpha-D-Galactosidase (BEANO PO) Take 2 tablets by mouth every morning before fiber intake and every evening      . ALPRAZolam (XANAX) 0.25 MG tablet Take 1 tablet (0.25 mg total) by mouth every 6 (six) hours as needed for anxiety.  100 tablet  3  . calcium carbonate (TUMS) 500 MG chewable tablet Chew 1 tablet by mouth as needed.       . Diphenhyd-Hydrocort-Nystatin SUSP 5 ml by mouth swish and spit four times per day as needed  240 mL  2  . fluticasone (FLONASE) 50 MCG/ACT nasal spray 1 spray to each nostril daily for allergy symptoms  16 g  5  . hydrocortisone (ANUSOL-HC) 25 MG suppository Use 1 suppository at bedtime for 10 days.  10 suppository  2  . hydrocortisone  (PROCTOCREAM-HC) 2.5 % rectal cream APPLY TO RECTUM TWICE A DAY AS NEEDED.  30 g  3  . hydrocortisone valerate ointment (WEST-CORT) 0.2 % Apply topically 2 (two) times daily. Apply 2 times a day for one week. When the week is finished apply Vaseline to the area 2 times a day indefinitely.  You can use the Westcort cream once a week after the week treatment  45 g  3  . Lactobacillus (REPHRESH PRO-B) CAPS Take 1 by mouth every day       . levothyroxine (SYNTHROID, LEVOTHROID) 75 MCG tablet TAKE 1 TABLET (75 MCG TOTAL)   BY MOUTH DAILY.  30 tablet  9  . Multiple Vitamins-Minerals (ICAPS AREDS FORMULA PO) Take by mouth.      . mupirocin ointment (BACTROBAN) 2 % Apply topically 3 (three) times daily.  22 g  2  . polyethylene glycol powder (MIRALAX) powder Take 17 g by mouth daily.        . Probiotic Product (ALIGN) 4 MG CAPS Take 1 tablet by mouth daily.        . ranitidine (ZANTAC) 150 MG tablet Take 150 mg by mouth 2 (two) times daily.      Marland Kitchen zolpidem (AMBIEN) 5 MG tablet Take 1 tablet (5 mg total) by mouth at bedtime as needed.  30 tablet  0   No current facility-administered medications on file prior to visit.      Review of Systems System review is negative for any constitutional, cardiac, pulmonary, GI or neuro symptoms or complaints other than as described in the HPI.     Objective:   Physical Exam Filed Vitals:   06/04/13 0959  BP: 144/76  Pulse: 80  Temp: 99.3 F (37.4 C)   Wt Readings from Last 3 Encounters:  06/04/13 100 lb 6.4 oz (45.541 kg)  04/05/13 101 lb (45.813 kg)  02/24/13 101 lb (45.813 kg)   Gen'l - Very elderly, thin woman in no distress HEENT - C&S clear, very dry mucus membranes w/o oral lesions. Neck- supple Cor 2+ radial, RRR Pulm - normal respirations. Neuro - Awake and alert, cognition normal, MS - able to rise from sitting w/o assistance, normal get up and go, normal gait.       Assessment & Plan:

## 2013-06-07 LAB — SJOGRENS SYNDROME-A EXTRACTABLE NUCLEAR ANTIBODY: SSA (Ro) (ENA) Antibody, IgG: 7 AU/mL (ref <30)

## 2013-06-07 LAB — ANTI-NUCLEAR AB-TITER (ANA TITER): ANA Titer 1: 1:40 {titer} — ABNORMAL HIGH

## 2013-06-07 LAB — ANA: Anti Nuclear Antibody(ANA): POSITIVE — AB

## 2013-06-07 NOTE — Assessment & Plan Note (Signed)
Reflux and GERd - this can certainly be a cause of hoarseness even if there is no burning pain. Plan Resume Prilosec 20 mg every AM - before breakfast.

## 2013-06-07 NOTE — Assessment & Plan Note (Signed)
Urinary retention - sounds like this is a anatomic problem. This is a difficult problem with the prolapse, etc. Plan  Per Dr. Norton Blizzard may be helpful.

## 2013-06-07 NOTE — Assessment & Plan Note (Signed)
Dry mouth - a continuing problem. It is interesting that Dr. Jonny Ruiz thought of Sjogren's but we have no laboratory confirmation. Plan Lab work today.  Use the saliva substitute from Dr. Patsi Sears.   Sugar free hard candies  Excellent dental hygiene.  If th saliva substitute doesn't work we can try pilocarpine

## 2013-06-14 ENCOUNTER — Encounter: Payer: Self-pay | Admitting: Internal Medicine

## 2013-06-21 DIAGNOSIS — H6120 Impacted cerumen, unspecified ear: Secondary | ICD-10-CM | POA: Diagnosis not present

## 2013-06-21 DIAGNOSIS — H608X9 Other otitis externa, unspecified ear: Secondary | ICD-10-CM | POA: Diagnosis not present

## 2013-06-21 DIAGNOSIS — H903 Sensorineural hearing loss, bilateral: Secondary | ICD-10-CM | POA: Diagnosis not present

## 2013-06-22 ENCOUNTER — Telehealth: Payer: Self-pay

## 2013-06-22 NOTE — Telephone Encounter (Signed)
Patient advised of MD advisement. She would still like to see if she can switch to liquid xanax.

## 2013-06-22 NOTE — Telephone Encounter (Signed)
Phone call back to patient. Due to her severe mouth dryness she would like to know if there is a substitute for xanax and levothyroxine she can take that will help with this. Please advise.

## 2013-06-22 NOTE — Telephone Encounter (Signed)
The liquid comes as 1 mg per ml. Current dose is 0.25 mg. It is not practical to try to take 1/4 ml at a time - would need an eye dropper to do this.   Xanax 0.25 is a small pill and should be a problem re: swallowing.

## 2013-06-22 NOTE — Telephone Encounter (Signed)
Neither product (xanax or levothyroxine) contribute to dry mouth. Levothyroxine does not come as a liquid. Xanax does come as a liquid - 1 mg/ml. Does this help?

## 2013-06-22 NOTE — Telephone Encounter (Signed)
The patient called and was hoping to get advice on how to handle "dryness" that she states is a side affect of her xanax rx.    Callback [phone]

## 2013-06-23 NOTE — Telephone Encounter (Signed)
Patient has been advised

## 2013-06-23 NOTE — Telephone Encounter (Signed)
Left message to return call 

## 2013-06-24 ENCOUNTER — Other Ambulatory Visit: Payer: Self-pay

## 2013-06-24 MED ORDER — ALPRAZOLAM 0.25 MG PO TABS: 0.2500 mg | ORAL_TABLET | Freq: Four times a day (QID) | ORAL | Status: DC | PRN

## 2013-07-05 ENCOUNTER — Other Ambulatory Visit (INDEPENDENT_AMBULATORY_CARE_PROVIDER_SITE_OTHER): Payer: Medicare Other

## 2013-07-05 ENCOUNTER — Encounter: Payer: Self-pay | Admitting: Internal Medicine

## 2013-07-05 ENCOUNTER — Ambulatory Visit (INDEPENDENT_AMBULATORY_CARE_PROVIDER_SITE_OTHER): Payer: Medicare Other | Admitting: Internal Medicine

## 2013-07-05 VITALS — BP 140/82 | HR 78 | Temp 97.8°F | Wt 102.8 lb

## 2013-07-05 DIAGNOSIS — I10 Essential (primary) hypertension: Secondary | ICD-10-CM | POA: Diagnosis not present

## 2013-07-05 DIAGNOSIS — E039 Hypothyroidism, unspecified: Secondary | ICD-10-CM | POA: Diagnosis not present

## 2013-07-05 DIAGNOSIS — D649 Anemia, unspecified: Secondary | ICD-10-CM

## 2013-07-05 DIAGNOSIS — G609 Hereditary and idiopathic neuropathy, unspecified: Secondary | ICD-10-CM

## 2013-07-05 DIAGNOSIS — R030 Elevated blood-pressure reading, without diagnosis of hypertension: Secondary | ICD-10-CM

## 2013-07-05 DIAGNOSIS — IMO0001 Reserved for inherently not codable concepts without codable children: Secondary | ICD-10-CM

## 2013-07-05 LAB — COMPREHENSIVE METABOLIC PANEL
ALBUMIN: 4 g/dL (ref 3.5–5.2)
ALT: 22 U/L (ref 0–35)
AST: 41 U/L — ABNORMAL HIGH (ref 0–37)
Alkaline Phosphatase: 98 U/L (ref 39–117)
BUN: 17 mg/dL (ref 6–23)
CO2: 30 mEq/L (ref 19–32)
Calcium: 9.3 mg/dL (ref 8.4–10.5)
Chloride: 101 mEq/L (ref 96–112)
Creatinine, Ser: 0.6 mg/dL (ref 0.4–1.2)
GFR: 95.2 mL/min (ref >60.00)
GLUCOSE: 80 mg/dL (ref 70–99)
POTASSIUM: 3.6 meq/L (ref 3.5–5.1)
Sodium: 140 mEq/L (ref 135–145)
Total Bilirubin: 0.5 mg/dL (ref 0.3–1.2)
Total Protein: 7.7 g/dL (ref 6.0–8.3)

## 2013-07-05 LAB — TSH: TSH: 1.12 u[IU]/mL (ref 0.35–5.50)

## 2013-07-05 LAB — T4, FREE: FREE T4: 1.02 ng/dL (ref 0.60–1.60)

## 2013-07-05 LAB — VITAMIN B12: Vitamin B-12: >1500 pg/mL — ABNORMAL HIGH (ref 211–911)

## 2013-07-05 NOTE — Progress Notes (Signed)
Subjective:    Patient ID: [name] Small, female    DOB: 12-20-1916, 80 y.o.   MRN: [id number]  HPI Has been feeling exhausted - more than usual. After exercise 9-10:15. Her BP was checked - twice it was mildly elevated 130/-, 140/ -.  She has been doing otherwise oK. Still with xerostomia but no real change in her condition.  PMH, FamHx and SocHx reviewed for any changes and relevance.  Current Outpatient Prescriptions on File Prior to Visit  Medication Sig Dispense Refill  . Alpha-D-Galactosidase (BEANO PO) Take 2 tablets by mouth every morning before fiber intake and every evening      . ALPRAZolam (XANAX) 0.25 MG tablet Take 1 tablet (0.25 mg total) by mouth every 6 (six) hours as needed for anxiety.  100 tablet  3  . antiseptic oral rinse (BIOTENE) LIQD 15 mLs by Mouth Rinse route as needed for dry mouth.      . Artificial Saliva (AQUORAL MT) Use as directed 1 spray in the mouth or throat.      . calcium carbonate (TUMS) 500 MG chewable tablet Chew 1 tablet by mouth as needed.       . Diphenhyd-Hydrocort-Nystatin SUSP 5 ml by mouth swish and spit four times per day as needed  240 mL  2  . estradiol (ESTRACE) 0.1 MG/GM vaginal cream Place 1 Applicatorful vaginally at bedtime.      . fluticasone (FLONASE) 50 MCG/ACT nasal spray 1 spray to each nostril daily for allergy symptoms  16 g  5  . hydrocortisone (ANUSOL-HC) 25 MG suppository Use 1 suppository at bedtime for 10 days.  10 suppository  2  . hydrocortisone (PROCTOCREAM-HC) 2.5 % rectal cream APPLY TO RECTUM TWICE A DAY AS NEEDED.  30 g  3  . hydrocortisone valerate ointment (WEST-CORT) 0.2 % Apply topically 2 (two) times daily. Apply 2 times a day for one week. When the week is finished apply Vaseline to the area 2 times a day indefinitely.  You can use the Westcort cream once a week after the week treatment  45 g  3  . Lactobacillus ([place] PRO-B) CAPS Take 1 by mouth every day       . levothyroxine (SYNTHROID, LEVOTHROID)  75 MCG tablet TAKE 1 TABLET (75 MCG TOTAL)   BY MOUTH DAILY.  30 tablet  9  . Multiple Vitamins-Minerals (ICAPS AREDS FORMULA PO) Take by mouth.      . mupirocin ointment (BACTROBAN) 2 % Apply topically 3 (three) times daily.  22 g  2  . polyethylene glycol powder (MIRALAX) powder Take 17 g by mouth daily.        . Probiotic Product (ALIGN) 4 MG CAPS Take 1 tablet by mouth daily.        . ranitidine (ZANTAC) 150 MG tablet Take 150 mg by mouth 2 (two) times daily.      Marland Kitchen zolpidem (AMBIEN) 5 MG tablet Take 1 tablet (5 mg total) by mouth at bedtime as needed.  30 tablet  0   No current facility-administered medications on file prior to visit.      Review of Systems System review is negative for any constitutional, cardiac, pulmonary, GI or neuro symptoms or complaints other than as described in the HPI.     Objective:   Physical Exam Filed Vitals:   07/05/13 0943  BP: 140/82  Pulse: 78  Temp: 97.8 F (36.6 C)   Wt Readings from Last 3 Encounters:  07/05/13 102 lb  12.8 oz (46.63 kg)  06/04/13 100 lb 6.4 oz (45.541 kg)  04/05/13 101 lb (45.813 kg)   BP Readings from Last 3 Encounters:  07/05/13 140/82  06/04/13 144/76  04/06/13 124/72   gen'l - Slender, elderly woman in no distress HEENT- normal except for very dry mm Cor - RRR Pulm - normal Neuro - A&O x 3, normal gait and station.       Assessment & Plan:  Elevated BP - very minimal elevation  Plan No new medication.

## 2013-07-05 NOTE — Progress Notes (Signed)
Pre visit review using our clinic review tool, if applicable. No additional management support is needed unless otherwise documented below in the visit note. 

## 2013-07-05 NOTE — Patient Instructions (Signed)
Good to see you.  Your blood pressure is not a problem.  Energy level - will check routine lab today including kidney function, liver function, B12, and electrolytes to insure nothing is metabolically amiss.

## 2013-07-11 ENCOUNTER — Encounter: Payer: Self-pay | Admitting: Internal Medicine

## 2013-07-14 DIAGNOSIS — H02109 Unspecified ectropion of unspecified eye, unspecified eyelid: Secondary | ICD-10-CM | POA: Diagnosis not present

## 2013-07-14 DIAGNOSIS — H01009 Unspecified blepharitis unspecified eye, unspecified eyelid: Secondary | ICD-10-CM | POA: Diagnosis not present

## 2013-07-14 DIAGNOSIS — H259 Unspecified age-related cataract: Secondary | ICD-10-CM | POA: Diagnosis not present

## 2013-07-14 DIAGNOSIS — H532 Diplopia: Secondary | ICD-10-CM | POA: Diagnosis not present

## 2013-07-16 ENCOUNTER — Telehealth: Payer: Self-pay | Admitting: *Deleted

## 2013-07-16 NOTE — Telephone Encounter (Signed)
Pt called c/o noticing some blood on this am, been taking estradiol. I called pt back but her phone continued to ring with no answer. I will try back later.

## 2013-08-05 ENCOUNTER — Ambulatory Visit (INDEPENDENT_AMBULATORY_CARE_PROVIDER_SITE_OTHER): Payer: Medicare Other | Admitting: Gynecology

## 2013-08-05 ENCOUNTER — Encounter: Payer: Self-pay | Admitting: Gynecology

## 2013-08-05 VITALS — BP 124/76

## 2013-08-05 DIAGNOSIS — N950 Postmenopausal bleeding: Secondary | ICD-10-CM | POA: Diagnosis not present

## 2013-08-05 DIAGNOSIS — N952 Postmenopausal atrophic vaginitis: Secondary | ICD-10-CM | POA: Diagnosis not present

## 2013-08-05 DIAGNOSIS — K6289 Other specified diseases of anus and rectum: Secondary | ICD-10-CM | POA: Diagnosis not present

## 2013-08-05 DIAGNOSIS — N898 Other specified noninflammatory disorders of vagina: Secondary | ICD-10-CM | POA: Diagnosis not present

## 2013-08-05 DIAGNOSIS — H903 Sensorineural hearing loss, bilateral: Secondary | ICD-10-CM | POA: Diagnosis not present

## 2013-08-05 DIAGNOSIS — N390 Urinary tract infection, site not specified: Secondary | ICD-10-CM

## 2013-08-05 DIAGNOSIS — B3731 Acute candidiasis of vulva and vagina: Secondary | ICD-10-CM

## 2013-08-05 DIAGNOSIS — H6120 Impacted cerumen, unspecified ear: Secondary | ICD-10-CM | POA: Diagnosis not present

## 2013-08-05 DIAGNOSIS — K629 Disease of anus and rectum, unspecified: Secondary | ICD-10-CM

## 2013-08-05 DIAGNOSIS — B373 Candidiasis of vulva and vagina: Secondary | ICD-10-CM

## 2013-08-05 DIAGNOSIS — H608X9 Other otitis externa, unspecified ear: Secondary | ICD-10-CM | POA: Diagnosis not present

## 2013-08-05 LAB — URINALYSIS W MICROSCOPIC + REFLEX CULTURE
Bilirubin Urine: NEGATIVE
Casts: NONE SEEN
Crystals: NONE SEEN
GLUCOSE, UA: NEGATIVE mg/dL
Ketones, ur: NEGATIVE mg/dL
Nitrite: NEGATIVE
PROTEIN: NEGATIVE mg/dL
Specific Gravity, Urine: 1.015 (ref 1.005–1.030)
Urobilinogen, UA: 0.2 mg/dL (ref 0.0–1.0)
pH: 6.5 (ref 5.0–8.0)

## 2013-08-05 LAB — WET PREP FOR TRICH, YEAST, CLUE
CLUE CELLS WET PREP: NONE SEEN
TRICH WET PREP: NONE SEEN

## 2013-08-05 MED ORDER — FLUCONAZOLE 150 MG PO TABS: 150.0000 mg | ORAL_TABLET | Freq: Once | ORAL | Status: DC

## 2013-08-05 MED ORDER — NITROFURANTOIN MONOHYD MACRO 100 MG PO CAPS: 100.0000 mg | ORAL_CAPSULE | Freq: Two times a day (BID) | ORAL | Status: DC

## 2013-08-05 MED ORDER — CIPROFLOXACIN HCL 250 MG PO TABS: 250.0000 mg | ORAL_TABLET | Freq: Two times a day (BID) | ORAL | Status: DC

## 2013-08-05 NOTE — Patient Instructions (Addendum)
Monilial Vaginitis Vaginitis in a soreness, swelling and redness (inflammation) of the vagina and vulva. Monilial vaginitis is not a sexually transmitted infection. CAUSES  Yeast vaginitis is caused by yeast (candida) that is normally found in your vagina. With a yeast infection, the candida has overgrown in number to a point that upsets the chemical balance. SYMPTOMS   White, thick vaginal discharge.  Swelling, itching, redness and irritation of the vagina and possibly the lips of the vagina (vulva).  Burning or painful urination.  Painful intercourse. DIAGNOSIS  Things that may contribute to monilial vaginitis are:  Postmenopausal and virginal states.  Pregnancy.  Infections.  Being tired, sick or stressed, especially if you had monilial vaginitis in the past.  Diabetes. Good control will help lower the chance.  Birth control pills.  Tight fitting garments.  Using bubble bath, feminine sprays, douches or deodorant tampons.  Taking certain medications that kill germs (antibiotics).  Sporadic recurrence can occur if you become ill. TREATMENT  Your caregiver will give you medication.  There are several kinds of anti monilial vaginal creams and suppositories specific for monilial vaginitis. For recurrent yeast infections, use a suppository or cream in the vagina 2 times a week, or as directed.  Anti-monilial or steroid cream for the itching or irritation of the vulva may also be used. Get your caregiver's permission.  Painting the vagina with methylene blue solution may help if the monilial cream does not work.  Eating yogurt may help prevent monilial vaginitis. HOME CARE INSTRUCTIONS   Finish all medication as prescribed.  Do not have sex until treatment is completed or after your caregiver tells you it is okay.  Take warm sitz baths.  Do not douche.  Do not use tampons, especially scented ones.  Wear cotton underwear.  Avoid tight pants and panty  hose.  Tell your sexual partner that you have a yeast infection. They should go to their caregiver if they have symptoms such as mild rash or itching.  Your sexual partner should be treated as well if your infection is difficult to eliminate.  Practice safer sex. Use condoms.  Some vaginal medications cause latex condoms to fail. Vaginal medications that harm condoms are:  Cleocin cream.  Butoconazole (Femstat).  Terconazole (Terazol) vaginal suppository.  Miconazole (Monistat) (may be purchased over the counter). SEEK MEDICAL CARE IF:   You have a temperature by mouth above 102 F (38.9 C).  The infection is getting worse after 2 days of treatment.  The infection is not getting better after 3 days of treatment.  You develop blisters in or around your vagina.  You develop vaginal bleeding, and it is not your menstrual period.  You have pain when you urinate.  You develop intestinal problems.  You have pain with sexual intercourse. Document Released: 03/13/2005 Document Revised: 08/26/2011 Document Reviewed: 11/25/2008 [facility] Patient Information 2014 [place], [place].  Continue to use the Estrace Vaginal cream but only once a week  Use vaseline externally daily  Make appointment with gastroenterologist Dr. Leroy Kennedy  Take Macrobid one tablet twice a day for 7 days  Take one Diflucan tablet for your yeast infection

## 2013-08-05 NOTE — Progress Notes (Signed)
   Patient is a 80 year old who presented to the office today believing that she had noticed some blood in her panties and was having sorry vaginal discharge. She also was having some urinary frequency but no true dysuria. Patient denied any fever, chills, nausea, vomiting or any back pain.  Patient last year had similar complaints and an ultrasound had been done which demonstrated the following: Uterus measures 6.1 x 4.8 x 3.4 cm with endometrial stripe of 4.2 mm. Uterus anteverted with fluid-filled endometrial cavity right calcified fibroid measured 4 mm. The fluid-filled cervix with right anterior wall defect vascular flow 4.5 mm. Right ovary atrophic continued presence of solid calcification measured 7 x 9 mm avascular. Left ovary was atrophic. Patient also undergone endometrial biopsy which was benign.   Patient multiple medical comorbidities long-standing history of uterine prolapse and cystocele with multiple comorbidities and not a surgical candidate. When I saw her back in October 2014 I have started her on Estrace vaginal cream twice a week. For her vulvar irritation as a result her her urinary incontinence I recommended that for 12 days she did use Bactroban cream twice a day. She was then supposed to be applying Vaseline twice a day to prevent any chafing and irritation from her urinary incontinence. She stated that she stop the estrogen because she thought that that might have caused her to bleed.  Exam: Bartholin urethra Skene glands well lubricated now that she is on estrogen.a creamy-like vaginal discharge was noted Uterine descensus was evident large cystocele was evident bimanual exam not done no lesions were seen.  Submucosal rectal lesion was noted and was friable on  Contact.Lenard Forth prep: Many yeast  Urinalysis: Too numerous to count WBC, a 11-20 RBC, many bacteria  Assessment/plan: #1 yeast vaginitis prescribe Diflucan 150 mg one by mouth #2 urinary tract infection patient  will be prescribed Macrobid 1 by mouth twice a day for 7 days #3 submucosal rectal lesion friable and contact in patient with prior history of adenocarcinoma of the colon will be referred back to her gastroenterologist for further assessment. I believe this is a source for her she did notice some bleeding. #4 I would recommend that she use the Estrace vaginal cream once a week and Vaseline application to the external genitalia twice a day.

## 2013-08-06 ENCOUNTER — Telehealth: Payer: Self-pay | Admitting: Internal Medicine

## 2013-08-06 NOTE — Telephone Encounter (Signed)
Left message for patient to call back  

## 2013-08-06 NOTE — Telephone Encounter (Signed)
No answer.  I will continue to try and reach the patient 

## 2013-08-07 LAB — URINE CULTURE
Colony Count: NO GROWTH
ORGANISM ID, BACTERIA: NO GROWTH

## 2013-08-09 NOTE — Telephone Encounter (Signed)
Patient will come in and see Nicoletta Ba PA tomorrow at 2:00

## 2013-08-10 ENCOUNTER — Encounter: Payer: Self-pay | Admitting: Nurse Practitioner

## 2013-08-10 ENCOUNTER — Ambulatory Visit (INDEPENDENT_AMBULATORY_CARE_PROVIDER_SITE_OTHER): Payer: Medicare Other | Admitting: Nurse Practitioner

## 2013-08-10 VITALS — BP 120/70 | HR 70 | Ht 59.0 in | Wt 100.8 lb

## 2013-08-10 DIAGNOSIS — K648 Other hemorrhoids: Secondary | ICD-10-CM | POA: Diagnosis not present

## 2013-08-10 NOTE — Patient Instructions (Signed)
Please follow up as needed  ___________________________________  Hemorrhoids Hemorrhoids are swollen veins around the rectum or anus. There are two types of hemorrhoids:   Internal hemorrhoids. These occur in the veins just inside the rectum. They may poke through to the outside and become irritated and painful.  External hemorrhoids. These occur in the veins outside the anus and can be felt as a painful swelling or hard lump near the anus. CAUSES  Pregnancy.   Obesity.   Constipation or diarrhea.   Straining to have a bowel movement.   Sitting for long periods on the toilet.  Heavy lifting or other activity that caused you to strain.  Anal intercourse. SYMPTOMS   Pain.   Anal itching or irritation.   Rectal bleeding.   Fecal leakage.   Anal swelling.   One or more lumps around the anus.  DIAGNOSIS  Your caregiver may be able to diagnose hemorrhoids by visual examination. Other examinations or tests that may be performed include:   Examination of the rectal area with a gloved hand (digital rectal exam).   Examination of anal canal using a small tube (scope).   A blood test if you have lost a significant amount of blood.  A test to look inside the colon (sigmoidoscopy or colonoscopy). TREATMENT Most hemorrhoids can be treated at home. However, if symptoms do not seem to be getting better or if you have a lot of rectal bleeding, your caregiver may perform a procedure to help make the hemorrhoids get smaller or remove them completely. Possible treatments include:   Placing a rubber band at the base of the hemorrhoid to cut off the circulation (rubber band ligation).   Injecting a chemical to shrink the hemorrhoid (sclerotherapy).   Using a tool to burn the hemorrhoid (infrared light therapy).   Surgically removing the hemorrhoid (hemorrhoidectomy).   Stapling the hemorrhoid to block blood flow to the tissue (hemorrhoid stapling).  HOME CARE  INSTRUCTIONS   Eat foods with fiber, such as whole grains, beans, nuts, fruits, and vegetables. Ask your doctor about taking products with added fiber in them (fibersupplements).  Increase fluid intake. Drink enough water and fluids to keep your urine clear or pale yellow.   Exercise regularly.   Go to the bathroom when you have the urge to have a bowel movement. Do not wait.   Avoid straining to have bowel movements.   Keep the anal area dry and clean. Use wet toilet paper or moist towelettes after a bowel movement.   Medicated creams and suppositories may be used or applied as directed.   Only take over-the-counter or prescription medicines as directed by your caregiver.   Take warm sitz baths for 15 20 minutes, 3 4 times a day to ease pain and discomfort.   Place ice packs on the hemorrhoids if they are tender and swollen. Using ice packs between sitz baths may be helpful.   Put ice in a plastic bag.   Place a towel between your skin and the bag.   Leave the ice on for 15 20 minutes, 3 4 times a day.   Do not use a donut-shaped pillow or sit on the toilet for long periods. This increases blood pooling and pain.  SEEK MEDICAL CARE IF:  You have increasing pain and swelling that is not controlled by treatment or medicine.  You have uncontrolled bleeding.  You have difficulty or you are unable to have a bowel movement.  You have pain or inflammation outside  the area of the hemorrhoids. MAKE SURE YOU:  Understand these instructions.  Will watch your condition.  Will get help right away if you are not doing well or get worse. Document Released: 05/31/2000 Document Revised: 05/20/2012 Document Reviewed: 04/07/2012 [facility] Patient Information 2014 [place].

## 2013-08-10 NOTE — Progress Notes (Signed)
HPI :  Patient is a 80 year old female known to Dr. Carlean Purl. She has a remote history of right colon cancer, s/p segmental colectomy in 2002. Other GI history includes diverticulosis, IBS and colon polyps. Patient is followed by GYN ( Dr. Toney Rakes ) for bladder prolapse who has referred her for a friable rectal mass.   Patient has been seen several times over the last few year for rectal bleeding / internal hemorrhoids. She is periodically treated with steroid suppositories and bleeding has remained manageable. Hemorrhoidal banding was previously discussed with patient her and her nephew who is a gastroenterologist but it was decided to avoid anything invasive.    Past Medical History  Diagnosis Date  . Prolapsed urethral mucosa   . Osteoarthrosis, unspecified whether generalized or localized, pelvic region and thigh   . Full incontinence of feces   . Asymptomatic varicose veins   . Anemia, unspecified   . Idiopathic osteoporosis   . Melanoma of skin, site unspecified   . Gastroenteritis   . Iron deficiency anemia, unspecified   . Irritable bowel syndrome   . Internal hemorrhoids without mention of complication   . Carotid bruit     hx of  . Neuropathy     peripheral  . HTN (hypertension)   . Esophageal reflux   . Diverticulosis   . Dyslipidemia   . Spinal stenosis, unspecified region other than cervical   . Hypothyroidism   . Colon adenocarcinoma     hx of  . Fecal incontinence   . Melanoma   . MVP (mitral valve prolapse)   . Female bladder prolapse, acquired 07/17/2010  . Vaginal bleeding, abnormal   . Varicose veins   . Idiopathic osteoporosis   . Vaginitis, atrophic   . Fibroid   . Carotid bruit     doppler normal in the past  . Hemorrhoid   . OA (osteoarthritis)     left hip    Family History  Problem Relation Age of Onset  . Stomach cancer Maternal Grandfather   . Colon cancer Neg Hx   . Heart disease Father   . Diabetes Father    History  Substance  Use Topics  . Smoking status: Former Research scientist (life sciences)  . Smokeless tobacco: Never Used  . Alcohol Use: 1.0 oz/week    2 drink(s) per week     Comment: occas wine 2 glasses a week   Current Outpatient Prescriptions  Medication Sig Dispense Refill  . Alpha-D-Galactosidase (BEANO PO) Take 2 tablets by mouth every morning before fiber intake and every evening      . ALPRAZolam (XANAX) 0.25 MG tablet Take 1 tablet (0.25 mg total) by mouth every 6 (six) hours as needed for anxiety.  100 tablet  3  . antiseptic oral rinse (BIOTENE) LIQD 15 mLs by Mouth Rinse route as needed for dry mouth.      . Artificial Saliva (AQUORAL MT) Use as directed 1 spray in the mouth or throat.      . calcium carbonate (TUMS) 500 MG chewable tablet Chew 1 tablet by mouth as needed.       . Diphenhyd-Hydrocort-Nystatin SUSP 5 ml by mouth swish and spit four times per day as needed  240 mL  2  . estradiol (ESTRACE) 0.1 MG/GM vaginal cream Place 1 Applicatorful vaginally at bedtime.      . fluconazole (DIFLUCAN) 150 MG tablet Take 1 tablet (150 mg total) by mouth once.  1 tablet  0  .  fluticasone (FLONASE) 50 MCG/ACT nasal spray 1 spray to each nostril daily for allergy symptoms  16 g  5  . hydrocortisone (ANUSOL-HC) 25 MG suppository Use 1 suppository at bedtime for 10 days.  10 suppository  2  . hydrocortisone (PROCTOCREAM-HC) 2.5 % rectal cream APPLY TO RECTUM TWICE A DAY AS NEEDED.  30 g  3  . hydrocortisone valerate ointment (WEST-CORT) 0.2 % Apply topically 2 (two) times daily. Apply 2 times a day for one week. When the week is finished apply Vaseline to the area 2 times a day indefinitely.  You can use the Westcort cream once a week after the week treatment  45 g  3  . Lactobacillus ([place] PRO-B) CAPS Take 1 by mouth every day       . levothyroxine (SYNTHROID, LEVOTHROID) 75 MCG tablet TAKE 1 TABLET (75 MCG TOTAL)   BY MOUTH DAILY.  30 tablet  9  . Multiple Vitamins-Minerals (ICAPS AREDS FORMULA PO) Take by mouth.      .  mupirocin ointment (BACTROBAN) 2 % Apply topically 3 (three) times daily.  22 g  2  . nitrofurantoin, macrocrystal-monohydrate, (MACROBID) 100 MG capsule Take 1 capsule (100 mg total) by mouth 2 (two) times daily.  14 capsule  0  . polyethylene glycol powder (MIRALAX) powder Take 17 g by mouth daily.        . Probiotic Product (ALIGN) 4 MG CAPS Take 1 tablet by mouth daily.        . ranitidine (ZANTAC) 150 MG tablet Take 150 mg by mouth 2 (two) times daily.      Marland Kitchen zolpidem (AMBIEN) 5 MG tablet Take 1 tablet (5 mg total) by mouth at bedtime as needed.  30 tablet  0   No current facility-administered medications for this visit.   Allergies  Allergen Reactions  . Amoxil [Amoxicillin] Diarrhea  . Crab [Shellfish Allergy]     sensitivity  . Demerol   . Meperidine Hcl Other (See Comments)    Drop in blood pressure  . Neosporin [Neomycin-Bacitracin Zn-Polymyx]   . Infed [Iron Dextran] Rash    Became very red over face, scalp, and arms    Physical Exam: BP 120/70  Pulse 70  Ht 4\' 11"  (1.499 m)  Wt 100 lb 12.8 oz (45.723 kg)  BMI 20.35 kg/m2 Constitutional: Pleasant,well-developed, white female in no acute distress. HEENT: Normocephalic and atraumatic. Conjunctivae are normal. No scleral icterus. Abdominal: Soft, nondistended, nontender. Bowel sounds active throughout.  Rectal: Very lax anal sphincter. Large, slightly friable, internal hemorrhoid. Hemorrhoid prolapses when bearing down but immediately reduces afterwards (grade 2 probably). Neurological: Alert and oriented to person place and time. Skin: Skin is warm and dry. No rashes noted. Psychiatric: Normal mood and affect. Behavior is normal.   ASSESSMENT AND PLAN:  1. Very large, internal hemorrhoid (probably grade 2) with chronic, intermittent bleeding. Banding discussed in past but decision was made to avoid anything invasive given advanced age. No new recommendations today. Avoid constipation. Would not recommend too frequent  use of steroids suppositories due to potential for tissue atropy.  2. Remote history of cecal cancer, s/p right hemicolectomy 2002. Last surveillance colonoscopy was in 2006 with findings of diverticulosis and small hepatic flexure polyp removed but not retrieved. She did have a virtual colonoscopy in Aug 2009. No lesions seen.

## 2013-08-11 ENCOUNTER — Encounter: Payer: Self-pay | Admitting: Nurse Practitioner

## 2013-08-13 NOTE — Progress Notes (Signed)
I also saw and evaluated the patient - she has a chronic, large and inflamed internal hemorrhoid that prolapses.  Gatha Mayer, MD, [facility]

## 2013-08-14 ENCOUNTER — Telehealth: Payer: Self-pay | Admitting: *Deleted

## 2013-08-14 ENCOUNTER — Encounter: Payer: Self-pay | Admitting: *Deleted

## 2013-08-14 NOTE — Telephone Encounter (Signed)
Former pt of DR. G...td  

## 2013-08-27 DIAGNOSIS — N390 Urinary tract infection, site not specified: Secondary | ICD-10-CM | POA: Diagnosis not present

## 2013-08-27 DIAGNOSIS — N3941 Urge incontinence: Secondary | ICD-10-CM | POA: Diagnosis not present

## 2013-09-08 ENCOUNTER — Encounter: Payer: Self-pay | Admitting: Geriatric Medicine

## 2013-09-08 ENCOUNTER — Non-Acute Institutional Stay: Payer: Medicare Other | Admitting: Geriatric Medicine

## 2013-09-08 VITALS — BP 100/68 | HR 74 | Temp 98.0°F | Ht 59.5 in | Wt 100.0 lb

## 2013-09-08 DIAGNOSIS — E039 Hypothyroidism, unspecified: Secondary | ICD-10-CM | POA: Diagnosis not present

## 2013-09-08 DIAGNOSIS — D649 Anemia, unspecified: Secondary | ICD-10-CM | POA: Diagnosis not present

## 2013-09-08 DIAGNOSIS — G609 Hereditary and idiopathic neuropathy, unspecified: Secondary | ICD-10-CM

## 2013-09-08 DIAGNOSIS — K117 Disturbances of salivary secretion: Secondary | ICD-10-CM

## 2013-09-08 DIAGNOSIS — C439 Malignant melanoma of skin, unspecified: Secondary | ICD-10-CM | POA: Diagnosis not present

## 2013-09-08 DIAGNOSIS — L821 Other seborrheic keratosis: Secondary | ICD-10-CM | POA: Diagnosis not present

## 2013-09-08 DIAGNOSIS — Z7189 Other specified counseling: Secondary | ICD-10-CM

## 2013-09-08 DIAGNOSIS — R682 Dry mouth, unspecified: Secondary | ICD-10-CM

## 2013-09-08 DIAGNOSIS — Z85038 Personal history of other malignant neoplasm of large intestine: Secondary | ICD-10-CM

## 2013-09-08 DIAGNOSIS — Z85820 Personal history of malignant melanoma of skin: Secondary | ICD-10-CM | POA: Diagnosis not present

## 2013-09-08 DIAGNOSIS — L259 Unspecified contact dermatitis, unspecified cause: Secondary | ICD-10-CM | POA: Diagnosis not present

## 2013-09-08 NOTE — Progress Notes (Signed)
Patient ID: [name] Small, female   DOB: 1917/01/24, 80 y.o.   MRN: [id number]   [facility] [phone])  Code Status: DNR. MOST form (updated 08/2013)  Contact Information   Name Relation Home Work Mobile   Small,[name] ([name]) Alanson Puls [phone]    [name], Small   [phone]   [name], Small [phone]         Chief Complaint  Patient presents with  . Medical Managment of Chronic Issues    New Patient    HPI: This is a 80 y.o. female resident of [facility], Independent Living section evaluated today to establish as patient with [facility]. [name] Small has been patient's Small for some time, he is retiring. Patient feels very well today does report the following chronic health issues in detail: Many skin cancers removed by [name].Albertini, including 2 melanoma Small. Patient continues to be followed closely.  Bladder / vaginal prolapse followed at both urology and Gyn providers. Patient reports some difficulty emptying bladder completely, has participated in 'bladder therapy' at [place] office. Recently treated by [name] Small (Gyn) for vaginal yeast infection/ UTI.  Long standing I issues, prominently constipation. Currently treats with Miralax, fiber in diet with good result.  Internal/ external hemorrhoids, declined banding, bleed easily. Peripheral neuropathy hands/ feet. Hands feel numb, feet feel as if in boots. Treats with exercise. Xerostomia, dry eyes; recent lab by [name] Small negative for Sjogren's syndrome. Very poor hearing, wears bilateral aides.    Allergies  Allergen Reactions  . Amoxil [Amoxicillin] Diarrhea  . Crab [Shellfish Allergy]     sensitivity  . Demerol   . Meperidine Hcl Other (See Comments)    Drop in blood pressure  . Neosporin [Neomycin-Bacitracin Zn-Polymyx]   . Infed [Iron Dextran] Rash    Became very red over face, scalp, and arms    MEDICATIONS -     Medication List      This list is accurate as of: 09/08/13  4:09 PM.  Always use your most recent med list.               ALIGN 4 MG Caps  Take 1 tablet by mouth daily.     ALPRAZolam 0.25 MG tablet  Commonly known as:  XANAX  Take 1 tablet (0.25 mg total) by mouth every 6 (six) hours as needed for anxiety.     antiseptic oral rinse Liqd  15 mLs by Mouth Rinse route as needed for dry mouth.     AQUORAL MT  Use as directed 1 spray in the mouth or throat.     BEANO PO  Take 2 tablets by mouth every morning before fiber intake and every evening     BIOTIN 5000 5 MG Caps  Generic drug:  Biotin  Take by mouth. Take one daily for hair/nails     cholecalciferol 400 UNITS Tabs tablet  Commonly known as:  VITAMIN D  Take 400 Units by mouth daily.     estradiol 0.1 MG/GM vaginal cream  Commonly known as:  ESTRACE  Place 1 Applicatorful vaginally at bedtime.     fluticasone 50 MCG/ACT nasal spray  Commonly known as:  FLONASE  1 spray to each nostril daily for allergy symptoms     hydrocortisone 2.5 % rectal cream  Commonly known as:  PROCTOCREAM-HC  APPLY TO RECTUM TWICE A DAY AS NEEDED.     hydrocortisone 25 MG suppository  Commonly known as:  ANUSOL-HC  Use 1 suppository at bedtime for 10 days.  hydrocortisone valerate ointment 0.2 %  Commonly known as:  WEST-CORT  Apply topically 2 (two) times daily. Apply 2 times a day for one week. When the week is finished apply Vaseline to the area 2 times a day indefinitely.  You can use the Westcort cream once a week after the week treatment     ICAPS AREDS FORMULA PO  Take by mouth.     levothyroxine 75 MCG tablet  Commonly known as:  SYNTHROID, LEVOTHROID  TAKE 1 TABLET (75 MCG TOTAL)   BY MOUTH DAILY.     MIRALAX powder  Generic drug:  polyethylene glycol powder  Take 17 g by mouth daily.     ranitidine 150 MG tablet  Commonly known as:  ZANTAC  Take 150 mg by mouth 2 (two) times daily.     [place] PRO-B Caps  Take 1 by mouth every  day     TUMS 500 MG chewable tablet  Generic drug:  calcium carbonate  Chew 1 tablet by mouth as needed.     YEAST BALANCE ICS Caps  Take by mouth. Take 2 twice a day one hour before meal         DATA REVIEWED  Radiologic Exams:   Cardiovascular Exams:   Laboratory Studies: Lab Results  Component Value Date   WBC 6.1 03/04/2013   HGB 12.6 03/04/2013   HCT 37.1 03/04/2013   MCV 92.3 03/04/2013   PLT 184.0 03/04/2013   Lab Results  Component Value Date   NA 140 07/05/2013   K 3.6 07/05/2013   BUN 17 07/05/2013   CREATININE 0.6 07/05/2013   Lab Results  Component Value Date   NA 140 07/05/2013   K 3.6 07/05/2013   CL 101 07/05/2013   CO2 30 07/05/2013   GLUCOSE 80 07/05/2013   BUN 17 07/05/2013   CREATININE 0.6 07/05/2013   CALCIUM 9.3 07/05/2013   ALBUMIN 4.0 07/05/2013   AST 41* 07/05/2013   ALT 22 07/05/2013   ALKPHOS 98 07/05/2013   BILITOT 0.5 07/05/2013   GFRNONAA 62.54 01/06/2009   GFRAA 122 12/02/2006   Lab Results  Component Value Date   VITAMINB12 >1500* 07/05/2013   Lab Results  Component Value Date   TSH 1.12 07/05/2013        Past Medical History  Diagnosis Date  . Prolapsed urethral mucosa   . Osteoarthrosis, unspecified whether generalized or localized, pelvic region and thigh   . Full incontinence of feces   . Asymptomatic varicose veins   . Anemia, unspecified   . Idiopathic osteoporosis   . Melanoma of skin, site unspecified   . Irritable bowel syndrome   . Internal hemorrhoids without mention of complication     bleed easily  . Carotid bruit     hx of  . Peripheral neuropathy     hands/ feet  . HTN (hypertension)   . Esophageal reflux   . Diverticulosis   . Dyslipidemia   . Spinal stenosis, unspecified region other than cervical   . Hypothyroidism   . History of colon cancer     Adenocarcinoma of right colon, stage T2, N1.s/p resection/chemotherapy 2002  . MVP (mitral valve prolapse)   . Female bladder prolapse, acquired 07/17/2010  .  Vaginal bleeding, abnormal   . Varicose veins   . Idiopathic osteoporosis   . Vaginitis, atrophic   . Fibroid   . Carotid bruit     doppler normal in the past  . Hemorrhoid  1963 surgical excision  . OA (osteoarthritis)     left hip  . Hx of colonic polyps     1965 surgical excision   Past Surgical History  Procedure Laterality Date  . Hemicolectomy  2002    right  . Colonoscopy w/ polypectomy  2006    3 mm polyp destroyed, diverticulosis  . Esophagogastroduodenoscopy  2009    mild gastritis  . Hemorrhoid surgery  1965  . Hysteroscopy    . Dilation and curettage of uterus    . Melanoma excision     Family Status  Relation Status Death Age  . Father Deceased   . Mother Deceased   . Sister Deceased   . Son Alive   . Son Alive    History   Social History Narrative   widowed after a very long and happy marriage almost 19 years. 2 sons - one a Advice worker, several grandchildren. Close and attentive family. Lives alone at [facility], since 7/ 2004 : paricipates in yoga and exercise. I-ADLs including driving. Supportive family who checks on her and visits her regularly.      End of Life Care: She clearly states she does not want CPR (out of facility order signed November 26, 2010). Reviewed with her MOST - she would not want prolonged intensive care, mechanical ventilation or prolonged artificial fluids or nutrition. Provided unsigned MOST form for her to review.      REVIEW OF SYSTEMS  DATA OBTAINED: from patient GENERAL: Feels well   No recent fever, fatigue, change in appetite or weight SKIN: No itch, rash or open wounds EYES: No eye pain, itching.  Has dry eyes No change in vision EARS: No earache, tinnitus,Very poor hearing NOSE: No congestion, drainage or bleeding MOUTH/THROAT: No mouth or tooth pain  No sore throat  Dry mouth  No difficulty chewing or swallowing RESPIRATORY: No cough, wheezing, SOB CARDIAC: No chest pain, palpitations  No  edema. CHEST/BREASTS: No discomfort, discharge or lumps in breasts GI: No abdominal pain  No nausea, vomiting,diarrhea or constipation  No heartburn or reflux  GU: No dysuria, frequency or urgency. Bladder prolapse, difficulty emptying bladder sometimes  No change in urine volume or character   MUSCULOSKELETAL: No joint pain, swelling or stiffness  No back pain  No muscle ache, pain, weakness  Gait is steady, exercises regularly  No recent falls.  NEUROLOGIC: No dizziness, fainting, headache, numbness  No change in mental status.  PSYCHIATRIC: No feelings of anxiety, depression  Sleeps well.      PHYSICAL EXAM Filed Vitals:   09/08/13 1436  BP: 100/68  Pulse: 74  Temp: 98 F (36.7 C)  TempSrc: Oral  Height: 4' 11.5" (1.511 m)  Weight: 100 lb (45.36 kg)   Body mass index is 19.87 kg/(m^2).  GENERAL APPEARANCE: No acute distress, appropriately groomed, thin body habitus, appears younger than 65yrs. Alert, pleasant, conversant. Speech mildly dysarthric SKIN: No diaphoresis, rash, unusual Small, wounds HEAD: Normocephalic, atraumatic EYES: Conjunctiva clear, lid ectropian bilateral. Pupils round, reactive. EOMs intact.  EARS: External exam WNL,  Hearing poor despite hearing aids. NOSE: No deformity or discharge. MOUTH/THROAT: Lips w/o Small. Oral mucosa, tongue moist, w/o lesion. Oropharynx w/o redness or Small.  NECK: Supple, full ROM. No thyroid tenderness, enlargement or nodule LYMPHATICS: No head, neck or supraclavicular adenopathy RESPIRATORY: Breathing is even, unlabored. Lung sounds are clear and full.  CHEST/BREASTS: No chest deformity. Breasts without tenderness, mass, discharge CARDIOVASCULAR: Heart RRR. No murmur or extra heart sounds  ARTERIAL: No carotid bruit. Carotid 2+ Femoral 1+, Popliteal absent, Left DP 1+, Rt. DP absent, PT 1+   VENOUS: Mild varicosities. No venous stasis skin changes  EDEMA: No peripheral edema.  GASTROINTESTINAL: Abdomen is soft,  non-tender, not distended w/ normal bowel sounds. No hepatic or splenic enlargement. No mass, ventral or inguinal hernia. MUSCULOSKELETAL: Moves all extremities with full ROM, strength and tone. Back is without kyphosis, scoliosis or spinal process tenderness. Gait is steady NEUROLOGIC: Oriented to time, place, person. Cranial nerves 2-12 grossly intact, no tremor. Patella, brachial DTR 1+. PSYCHIATRIC: Mood and affect appropriate to situation   ASSESSMENT/PLAN  Melanoma of skin, site unspecified Follows with dermatology, recent evaluation, no Small.  NEUROPATHY, IDIOPATHIC PERIPHERAL Mild discomfort, does not interfere with function  ANEMIA, NORMOCYTIC, CHRONIC Most recent CBC satisfactory, follow at intervals  HYPOTHYROIDISM Most recent TSG+H satisfactory, continue supplement, follow lab at intervals  Dry mouth No lab evidence of Sjogren's. Patient has worked with [facility], is using artificial saliva product. Condition places her at increased risk for oral candidiasis.   Advanced care planning/counseling discussion MOST form reviewed, no changes: DNR, Limited interventions, avoid ICU if possible. Consider antibitic use/IVF. No tube feeding. Goal is maintenance of function and quality of life.  History of colon cancer Oncology follow up scheduled 09/2013    Family/ staff Communication:     Goals of care:   See above   Labs/tests ordered:    Follow up: No Follow-up on file.  Mardene Celeste, NP-C [facility] [phone]  09/08/2013

## 2013-09-09 ENCOUNTER — Other Ambulatory Visit: Payer: Self-pay

## 2013-09-09 MED ORDER — LEVOTHYROXINE SODIUM 75 MCG PO TABS: ORAL_TABLET | ORAL | Status: DC

## 2013-09-11 ENCOUNTER — Encounter: Payer: Self-pay | Admitting: Geriatric Medicine

## 2013-09-11 DIAGNOSIS — Z85038 Personal history of other malignant neoplasm of large intestine: Secondary | ICD-10-CM | POA: Insufficient documentation

## 2013-09-11 NOTE — Assessment & Plan Note (Signed)
Mild discomfort, does not interfere with function

## 2013-09-11 NOTE — Assessment & Plan Note (Signed)
No lab evidence of Sjogren's. Patient has worked with [facility], is using artificial saliva product. Condition places her at increased risk for oral candidiasis.

## 2013-09-11 NOTE — Assessment & Plan Note (Signed)
Most recent CBC satisfactory, follow at intervals

## 2013-09-11 NOTE — Assessment & Plan Note (Signed)
Oncology follow up scheduled 09/2013

## 2013-09-11 NOTE — Assessment & Plan Note (Signed)
MOST form reviewed, no changes: DNR, Limited interventions, avoid ICU if possible. Consider antibitic use/IVF. No tube feeding. Goal is maintenance of function and quality of life.

## 2013-09-11 NOTE — Assessment & Plan Note (Signed)
Follows with dermatology, recent evaluation, no lesions.

## 2013-09-11 NOTE — Assessment & Plan Note (Signed)
Most recent TSG+H satisfactory, continue supplement, follow lab at intervals

## 2013-09-20 DIAGNOSIS — M6281 Muscle weakness (generalized): Secondary | ICD-10-CM | POA: Diagnosis not present

## 2013-09-20 DIAGNOSIS — R339 Retention of urine, unspecified: Secondary | ICD-10-CM | POA: Diagnosis not present

## 2013-09-20 DIAGNOSIS — R279 Unspecified lack of coordination: Secondary | ICD-10-CM | POA: Diagnosis not present

## 2013-09-20 DIAGNOSIS — N3941 Urge incontinence: Secondary | ICD-10-CM | POA: Diagnosis not present

## 2013-09-27 DIAGNOSIS — H903 Sensorineural hearing loss, bilateral: Secondary | ICD-10-CM | POA: Diagnosis not present

## 2013-09-27 DIAGNOSIS — H6120 Impacted cerumen, unspecified ear: Secondary | ICD-10-CM | POA: Diagnosis not present

## 2013-09-27 DIAGNOSIS — H608X9 Other otitis externa, unspecified ear: Secondary | ICD-10-CM | POA: Diagnosis not present

## 2013-10-11 ENCOUNTER — Ambulatory Visit: Payer: Medicare Other | Admitting: Gynecology

## 2013-10-12 ENCOUNTER — Encounter: Payer: Self-pay | Admitting: Oncology

## 2013-10-12 ENCOUNTER — Ambulatory Visit (HOSPITAL_BASED_OUTPATIENT_CLINIC_OR_DEPARTMENT_OTHER): Payer: Medicare Other | Admitting: Oncology

## 2013-10-12 VITALS — BP 137/64 | HR 76 | Temp 97.7°F | Resp 17 | Ht 59.0 in | Wt 100.7 lb

## 2013-10-12 DIAGNOSIS — K921 Melena: Secondary | ICD-10-CM

## 2013-10-12 DIAGNOSIS — Z85038 Personal history of other malignant neoplasm of large intestine: Secondary | ICD-10-CM

## 2013-10-12 DIAGNOSIS — C189 Malignant neoplasm of colon, unspecified: Secondary | ICD-10-CM | POA: Insufficient documentation

## 2013-10-12 NOTE — Progress Notes (Signed)
Hematology and Oncology Follow Up Visit  [name] Small [id number] 25-Mar-1917 80 y.o. 10/12/2013 8:32 AM   Principle Diagnosis: 80 year old woman with history of colon cancer dating back to 2002. She was diagnosed with stage III at that time.   Prior Therapy: She is status post surgical resection followed by adjuvant 5-FU chemotherapy.  Current therapy: Observation and surveillance.  Interim History:  This is a pleasant woman presents today for a followup visit. She is to be followed by Dr. Beryle Beams and presents today another evaluation. She has been doing fairly well without any recent problems. She still reports occasional hematochezia associated with her hemorrhoids. She also had some urological issues predominantly incontinence and she follows up with urology regarding that. She has not reported any weight loss or appetite changes. Has not reported any pain or discomfort. Her quality of life remains about the same. She still rather functional and lives at senior living facility [facility].  Medications: I have reviewed the patient's current medications.  Current Outpatient Prescriptions  Medication Sig Dispense Refill  . Alpha-D-Galactosidase (BEANO PO) Take 2 tablets by mouth every morning before fiber intake and every evening      . ALPRAZolam (XANAX) 0.25 MG tablet Take 1 tablet (0.25 mg total) by mouth every 6 (six) hours as needed for anxiety.  100 tablet  3  . antiseptic oral rinse (BIOTENE) LIQD 15 mLs by Mouth Rinse route as needed for dry mouth.      . Artificial Saliva (AQUORAL MT) Use as directed 1 spray in the mouth or throat.      . Biotin (BIOTIN 5000) 5 MG CAPS Take by mouth. Take one daily for hair/nails      . calcium carbonate (TUMS) 500 MG chewable tablet Chew 1 tablet by mouth as needed.       . cholecalciferol (VITAMIN D) 400 UNITS TABS tablet Take 400 Units by mouth daily.      Marland Kitchen estradiol (ESTRACE) 0.1 MG/GM vaginal cream Place 1 Applicatorful vaginally  at bedtime.      . fluticasone (FLONASE) 50 MCG/ACT nasal spray 1 spray to each nostril daily for allergy symptoms  16 g  5  . hydrocortisone (ANUSOL-HC) 25 MG suppository Use 1 suppository at bedtime for 10 days.  10 suppository  2  . hydrocortisone (PROCTOCREAM-HC) 2.5 % rectal cream APPLY TO RECTUM TWICE A DAY AS NEEDED.  30 g  3  . hydrocortisone valerate ointment (WEST-CORT) 0.2 % Apply topically 2 (two) times daily. Apply 2 times a day for one week. When the week is finished apply Vaseline to the area 2 times a day indefinitely.  You can use the Westcort cream once a week after the week treatment  45 g  3  . Lactobacillus ([place] PRO-B) CAPS Take 1 by mouth every day       . levothyroxine (SYNTHROID, LEVOTHROID) 75 MCG tablet Take one tablet daily before breakfast for thyroid  30 tablet  5  . Misc Natural Products (YEAST BALANCE ICS) CAPS Take by mouth. Take 2 twice a day one hour before meal      . Multiple Vitamins-Minerals (ICAPS AREDS FORMULA PO) Take by mouth.      . polyethylene glycol powder (MIRALAX) powder Take 17 g by mouth daily.        . Probiotic Product (ALIGN) 4 MG CAPS Take 1 tablet by mouth daily.        . ranitidine (ZANTAC) 150 MG tablet Take 150 mg by mouth  2 (two) times daily.       No current facility-administered medications for this visit.     Allergies:  Allergies  Allergen Reactions  . Amoxil [Amoxicillin] Diarrhea  . Crab [Shellfish Allergy]     sensitivity  . Demerol   . Meperidine Hcl Other (See Comments)    Drop in blood pressure  . Neosporin [Neomycin-Bacitracin Zn-Polymyx]   . Infed [Iron Dextran] Rash    Became very red over face, scalp, and arms    Past Medical History, Surgical history, Social history, and Family History were reviewed and updated.  Review of Systems: Constitutional:  Negative for fever, chills, night sweats, anorexia, weight loss, pain. Cardiovascular: no chest pain or dyspnea on exertion Respiratory: no cough, shortness  of breath, or wheezing Neurological: no TIA or stroke symptoms Dermatological: negative ENT: negative Skin: Negative. Gastrointestinal: no abdominal pain, change in bowel habits, does report hematochezia Genito-Urinary: no dysuria, trouble voiding, or hematuria Hematological and Lymphatic: negative Breast: negative Musculoskeletal: negative Remaining ROS negative. Physical Exam: Blood pressure 137/64, pulse 76, temperature 97.7 F (36.5 C), temperature source Oral, resp. rate 17, height 4\' 11"  (1.499 m), weight 100 lb 11.2 oz (45.677 kg), SpO2 100.00%. ECOG: 1 General appearance: alert and cooperative Head: Normocephalic, without obvious abnormality, atraumatic Neck: no adenopathy, no carotid bruit, no JVD, supple, symmetrical, trachea midline and thyroid not enlarged, symmetric, no tenderness/mass/nodules Lymph nodes: Cervical, supraclavicular, and axillary nodes normal. Heart:regular rate and rhythm, S1, S2 normal, no murmur, click, rub or gallop Lung:chest clear, no wheezing, rales, normal symmetric air entry Abdomin: soft, non-tender, without masses or organomegaly EXT:no erythema, induration, or nodules   Lab Results: Lab Results  Component Value Date   WBC 6.1 03/04/2013   HGB 12.6 03/04/2013   HCT 37.1 03/04/2013   MCV 92.3 03/04/2013   PLT 184.0 03/04/2013     Chemistry      Component Value Date/Time   NA 140 07/05/2013 1022   K 3.6 07/05/2013 1022   CL 101 07/05/2013 1022   CO2 30 07/05/2013 1022   BUN 17 07/05/2013 1022   CREATININE 0.6 07/05/2013 1022      Component Value Date/Time   CALCIUM 9.3 07/05/2013 1022   CALCIUM 9.4 03/17/2012 1012   ALKPHOS 98 07/05/2013 1022   AST 41* 07/05/2013 1022   ALT 22 07/05/2013 1022   BILITOT 0.5 07/05/2013 1022        Impression and Plan:  80 year old woman with the following issues:  1. Stage III colon cancer dating back to 2002 status post surgical resection and adjuvant chemotherapy. Her cancer is so remote that this point  and requires no further oncology followup. There is no signs of symptoms to suggest cancer relapse at this time. She still goes strong despite her age but really does not want any aggressive interventions. I see no real need for any further imaging studies at this time.  2. Hematochezia: She does have history of internal hemorrhoids and followed by gastroenterology. She refused banding in the past.  3. Followup: Will be as needed in the future.    Wyatt Portela, MD 4/28/20158:32 AM

## 2013-10-15 ENCOUNTER — Ambulatory Visit (INDEPENDENT_AMBULATORY_CARE_PROVIDER_SITE_OTHER): Payer: Medicare Other | Admitting: Women's Health

## 2013-10-15 ENCOUNTER — Encounter: Payer: Self-pay | Admitting: Women's Health

## 2013-10-15 DIAGNOSIS — N898 Other specified noninflammatory disorders of vagina: Secondary | ICD-10-CM

## 2013-10-15 DIAGNOSIS — IMO0001 Reserved for inherently not codable concepts without codable children: Secondary | ICD-10-CM

## 2013-10-15 DIAGNOSIS — R350 Frequency of micturition: Secondary | ICD-10-CM

## 2013-10-15 LAB — WET PREP FOR TRICH, YEAST, CLUE
Clue Cells Wet Prep HPF POC: NONE SEEN
TRICH WET PREP: NONE SEEN
YEAST WET PREP: NONE SEEN

## 2013-10-15 NOTE — Progress Notes (Signed)
Patient ID: [name] Small, female   DOB: [birth year], 80 y.o.   MRN: [id number] Presents with complaint of vaginal discharge, questioning pink discharge, mild itching. Using vaginal estrogen without much relief, having problems with incontinence, history of cystocele, rectocele and incontinence. Urinary frequency with urgency, wears a pad daily and changes frequently for varying degrees of wetness. Denies abdominal pain, pain with urination or fever.  Exam: Elderly, no apparent distress, uses a walker but can walk independently. External genitalia erythematous, cystocele, rectocele, no visible lesions or skin breakdown. Wet prep with a Q-tip,   no visible blood, wet prep negative.  Urinary frequency with incontinence Vaginal irritation/cystocele/rectocele  Plan: Urine culture pending. Apply A&D to skin for barrier, continue to try to keep dry,. Stop estrogen cream for several weeks to see if any relief of pink discharge.Marland Kitchen

## 2013-10-18 ENCOUNTER — Other Ambulatory Visit: Payer: Self-pay | Admitting: Women's Health

## 2013-10-18 LAB — URINE CULTURE: Colony Count: >=100000

## 2013-10-18 MED ORDER — CIPROFLOXACIN HCL 250 MG PO TABS
250.0000 mg | ORAL_TABLET | Freq: Two times a day (BID) | ORAL | Status: DC
Start: 2013-10-18 — End: 2013-12-14

## 2013-10-26 DIAGNOSIS — R339 Retention of urine, unspecified: Secondary | ICD-10-CM | POA: Diagnosis not present

## 2013-10-26 DIAGNOSIS — R279 Unspecified lack of coordination: Secondary | ICD-10-CM | POA: Diagnosis not present

## 2013-10-26 DIAGNOSIS — N952 Postmenopausal atrophic vaginitis: Secondary | ICD-10-CM | POA: Diagnosis not present

## 2013-10-26 DIAGNOSIS — M6281 Muscle weakness (generalized): Secondary | ICD-10-CM | POA: Diagnosis not present

## 2013-11-04 ENCOUNTER — Encounter: Payer: Self-pay | Admitting: Gynecology

## 2013-11-09 DIAGNOSIS — H6120 Impacted cerumen, unspecified ear: Secondary | ICD-10-CM | POA: Diagnosis not present

## 2013-11-09 DIAGNOSIS — H903 Sensorineural hearing loss, bilateral: Secondary | ICD-10-CM | POA: Diagnosis not present

## 2013-11-09 DIAGNOSIS — H608X9 Other otitis externa, unspecified ear: Secondary | ICD-10-CM | POA: Diagnosis not present

## 2013-11-23 DIAGNOSIS — R339 Retention of urine, unspecified: Secondary | ICD-10-CM | POA: Diagnosis not present

## 2013-11-23 DIAGNOSIS — N3941 Urge incontinence: Secondary | ICD-10-CM | POA: Diagnosis not present

## 2013-11-23 DIAGNOSIS — R279 Unspecified lack of coordination: Secondary | ICD-10-CM | POA: Diagnosis not present

## 2013-11-23 DIAGNOSIS — M6281 Muscle weakness (generalized): Secondary | ICD-10-CM | POA: Diagnosis not present

## 2013-12-06 ENCOUNTER — Other Ambulatory Visit: Payer: Self-pay | Admitting: Gynecology

## 2013-12-07 ENCOUNTER — Telehealth: Payer: Self-pay

## 2013-12-07 NOTE — Telephone Encounter (Signed)
Patient had appt for 12/08/13 at [facility] clinic for vaginal bleeding. Called patient and told her she needed to return to the GYN office that she saw 10/15/13. They had in notes if she got worse or hadn't gotten better to return. Talked with patient that I would cancel her appt with [place] and that she should see the GYN, she agreed with this and was going to call them.

## 2013-12-08 ENCOUNTER — Encounter: Payer: Medicare Other | Admitting: Nurse Practitioner

## 2013-12-14 ENCOUNTER — Ambulatory Visit (INDEPENDENT_AMBULATORY_CARE_PROVIDER_SITE_OTHER): Payer: Medicare Other | Admitting: Nurse Practitioner

## 2013-12-14 ENCOUNTER — Encounter: Payer: Self-pay | Admitting: Nurse Practitioner

## 2013-12-14 VITALS — BP 118/70 | HR 62 | Ht <= 58 in | Wt 99.6 lb

## 2013-12-14 DIAGNOSIS — K648 Other hemorrhoids: Secondary | ICD-10-CM | POA: Diagnosis not present

## 2013-12-14 MED ORDER — HYDROCORTISONE ACETATE 25 MG RE SUPP: 25.0000 mg | Freq: Two times a day (BID) | RECTAL | Status: DC

## 2013-12-14 MED ORDER — PRAMOXINE HCL 1 % RE FOAM: 1.0000 "application " | Freq: Every day | RECTAL | Status: DC | PRN

## 2013-12-14 NOTE — Patient Instructions (Signed)
We have sent the following medications to your pharmacy for you to pick up at your convenience: Proctofoam, can place internally as needed

## 2013-12-14 NOTE — Progress Notes (Signed)
     History of Present Illness:  Patient is a 80 year old female known to Dr. Carlean Purl. She has a history of colon cancer, s/p right hemilectomy. Patient has been seen here several times for rectal bleeding secondary to hemorrhoids. Band ligation offered, she declined. Patient back today after another episode of bleeding. Since her last visit here late February the bleeding has been scant but last week she had increased bleeding with a BM. No rectal pain. Bowels are actually moving quite well with bowel regimen. She used Anusol suppository x 3 days after the episode of bleeding and hasn't had any sgnificant bleeding since. She is taking Citrucel, Fiber One, rarely needs Miralax now.   Current Medications, Allergies, Past Medical History, Past Surgical History, Family History and Social History were reviewed in [employer] record.   Physical Exam: General: Pleasant, well developed , white fmale in no acute distress Head: Normocephalic and atraumatic Eyes:  sclerae anicteric, conjunctiva pink  Ears: Normal auditory acuity Lungs: Clear throughout to auscultation Heart: Regular rate and rhythm Abdomen: Soft, non distended, non-tender. No masses, no hepatomegaly. Normal bowel sounds Rectal: lax anal sphincter. Mildly inflamed protruding hemorhoids Musculoskeletal: Symmetrical with no gross deformities  Extremities: No edema  Neurological: Alert oriented x 4, grossly nonfocal Psychological:  Alert and cooperative. Normal mood and affect  Assessment and Recommendations:    80 year old female with chronic rectal bleeding and known recent history of hemorrhoids. She doesn't want band ligation. Continue bowel regimen to avoid constipation. We discussed use of steroid cream /suppositories. Advised again regular use of these to avoid tissue thinning. She should use a few days at the time as needed but not on a regular basis.

## 2013-12-15 ENCOUNTER — Encounter: Payer: Self-pay | Admitting: Nurse Practitioner

## 2013-12-20 ENCOUNTER — Encounter: Payer: Self-pay | Admitting: Gynecology

## 2013-12-20 ENCOUNTER — Ambulatory Visit (INDEPENDENT_AMBULATORY_CARE_PROVIDER_SITE_OTHER): Payer: Medicare Other | Admitting: Gynecology

## 2013-12-20 DIAGNOSIS — N8111 Cystocele, midline: Secondary | ICD-10-CM

## 2013-12-20 DIAGNOSIS — N393 Stress incontinence (female) (male): Secondary | ICD-10-CM | POA: Diagnosis not present

## 2013-12-20 DIAGNOSIS — N9089 Other specified noninflammatory disorders of vulva and perineum: Secondary | ICD-10-CM | POA: Diagnosis not present

## 2013-12-20 DIAGNOSIS — N819 Female genital prolapse, unspecified: Secondary | ICD-10-CM

## 2013-12-20 DIAGNOSIS — R319 Hematuria, unspecified: Secondary | ICD-10-CM | POA: Diagnosis not present

## 2013-12-20 DIAGNOSIS — N952 Postmenopausal atrophic vaginitis: Secondary | ICD-10-CM

## 2013-12-20 DIAGNOSIS — IMO0002 Reserved for concepts with insufficient information to code with codable children: Secondary | ICD-10-CM

## 2013-12-20 LAB — URINALYSIS W MICROSCOPIC + REFLEX CULTURE
Bilirubin Urine: NEGATIVE
Casts: NONE SEEN
Crystals: NONE SEEN
GLUCOSE, UA: NEGATIVE mg/dL
Ketones, ur: NEGATIVE mg/dL
Nitrite: NEGATIVE
PROTEIN: NEGATIVE mg/dL
Specific Gravity, Urine: 1.01 (ref 1.005–1.030)
Urobilinogen, UA: 0.2 mg/dL (ref 0.0–1.0)
pH: 6 (ref 5.0–8.0)

## 2013-12-20 LAB — WET PREP FOR TRICH, YEAST, CLUE
Clue Cells Wet Prep HPF POC: NONE SEEN
TRICH WET PREP: NONE SEEN
YEAST WET PREP: NONE SEEN

## 2013-12-20 MED ORDER — MOMETASONE FUROATE 0.1 % EX SOLN: Freq: Every day | CUTANEOUS | Status: DC

## 2013-12-20 NOTE — Progress Notes (Signed)
   Patient presented to the office today complaining of some vaginal irritation and some slight discomfort urination he wasn't sure she saw blood in her urine.  Patient in 2013 had an ultrasound which demonstrated the uterus to measure 6.1 x 4.8 x 3.4 cm with endometrial stripe of 4.2 mm. Uterus anteverted with fluid-filled endometrial cavity right calcified fibroid measured 4 mm. The fluid-filled cervix with right anterior wall defect vascular flow 4.5 mm. Right ovary atrophic continued presence of solid calcification measured 7 x 9 mm avascular. Left ovary was atrophic. Patient also undergone endometrial biopsy which was benign.  Patient multiple medical comorbidities long-standing history of uterine prolapse and cystocele with multiple comorbidities and not a surgical candidate. She is currently using vaginal estrogen 1-2 times per week. She suffers incised for urinary incontinence as cause a lot of vulvar irritation.  Exam: Bartholin's urethra Skene glands with atrophic changes external genitalia wrong and read from and urinary incontinence. Speculum exam no visible lesions or unusual discharge. Cervix: No lesions or discharge Rectal exam: Not done  Wet prep negative for yeast, trach, or clue cell. Many white blood cells and bacteria were noted  Urinalysis 3-6 WBC, 0-2 RBC, moderate bacteria  Assessment/plan: 80 year old patient with irritation of the external genitalia cervix all the recurrent contents I'm going to prescribe Elocon 0.1% cream to apply every other night for 4-6 weeks. She can continue to use her estrogen vaginal cream once a week. We will await the results of the urine culture. If she has any vaginal bleeding she will report to the office so that we can repeat a sonohysterogram and an endometrial biopsy.

## 2013-12-21 DIAGNOSIS — H608X9 Other otitis externa, unspecified ear: Secondary | ICD-10-CM | POA: Diagnosis not present

## 2013-12-21 DIAGNOSIS — H6120 Impacted cerumen, unspecified ear: Secondary | ICD-10-CM | POA: Diagnosis not present

## 2013-12-21 DIAGNOSIS — H903 Sensorineural hearing loss, bilateral: Secondary | ICD-10-CM | POA: Diagnosis not present

## 2013-12-22 LAB — URINE CULTURE

## 2013-12-22 NOTE — Progress Notes (Signed)
Agree with Ms. Guenther's assessment and plan. Carl E. Gessner, MD, FACG   

## 2013-12-23 ENCOUNTER — Telehealth: Payer: Self-pay

## 2013-12-23 ENCOUNTER — Other Ambulatory Visit: Payer: Self-pay | Admitting: Gynecology

## 2013-12-23 NOTE — Telephone Encounter (Signed)
Patient said that she has some hydrocortisone cream 2/10 % that you prescribed for her back in June. She is going to use that instead of the [employer] that you prescribed for her. She said that she read about the Elocon and it advised not using it in the vaginal area. She is going to return it to the pharmacy and she wants [place] to remove it from her med list.  She said she does not think she used the [facility] cream long enough or enough as she was a bit hesitant to apply it but she is going to use it now.  She wanted me to let you know.

## 2013-12-23 NOTE — Telephone Encounter (Signed)
ok 

## 2013-12-24 DIAGNOSIS — R3129 Other microscopic hematuria: Secondary | ICD-10-CM | POA: Diagnosis not present

## 2013-12-29 DIAGNOSIS — R339 Retention of urine, unspecified: Secondary | ICD-10-CM | POA: Diagnosis not present

## 2013-12-29 DIAGNOSIS — R300 Dysuria: Secondary | ICD-10-CM | POA: Diagnosis not present

## 2013-12-31 ENCOUNTER — Other Ambulatory Visit: Payer: Self-pay | Admitting: *Deleted

## 2014-01-03 ENCOUNTER — Other Ambulatory Visit: Payer: Self-pay

## 2014-01-03 ENCOUNTER — Other Ambulatory Visit: Payer: Self-pay | Admitting: *Deleted

## 2014-01-03 MED ORDER — ALPRAZOLAM 0.25 MG PO TABS: 0.2500 mg | ORAL_TABLET | Freq: Four times a day (QID) | ORAL | Status: DC | PRN

## 2014-01-03 NOTE — Telephone Encounter (Signed)
Patient wants it called into [facility] and to deliver. Spoke with Jerene Pitch at pharmacy

## 2014-01-05 ENCOUNTER — Telehealth: Payer: Self-pay

## 2014-01-05 ENCOUNTER — Encounter: Payer: Self-pay | Admitting: Nurse Practitioner

## 2014-01-05 ENCOUNTER — Encounter: Payer: Self-pay | Admitting: Geriatric Medicine

## 2014-01-05 ENCOUNTER — Non-Acute Institutional Stay: Payer: Medicare Other | Admitting: Nurse Practitioner

## 2014-01-05 VITALS — BP 124/64 | HR 80 | Wt 98.0 lb

## 2014-01-05 DIAGNOSIS — K219 Gastro-esophageal reflux disease without esophagitis: Secondary | ICD-10-CM | POA: Diagnosis not present

## 2014-01-05 DIAGNOSIS — N390 Urinary tract infection, site not specified: Secondary | ICD-10-CM

## 2014-01-05 DIAGNOSIS — K5900 Constipation, unspecified: Secondary | ICD-10-CM

## 2014-01-05 DIAGNOSIS — D649 Anemia, unspecified: Secondary | ICD-10-CM | POA: Diagnosis not present

## 2014-01-05 DIAGNOSIS — E039 Hypothyroidism, unspecified: Secondary | ICD-10-CM | POA: Diagnosis not present

## 2014-01-05 DIAGNOSIS — Z85038 Personal history of other malignant neoplasm of large intestine: Secondary | ICD-10-CM

## 2014-01-05 DIAGNOSIS — R682 Dry mouth, unspecified: Secondary | ICD-10-CM

## 2014-01-05 DIAGNOSIS — K117 Disturbances of salivary secretion: Secondary | ICD-10-CM

## 2014-01-05 NOTE — Progress Notes (Signed)
Patient ID: [name] Small, female   DOB: 08-Jan-1917, 80 y.o.   MRN: [id number]   [facility] [phone])  Code Status: DNR. MOST form (updated 08/2013)      Contact Information   Name Relation Home Work Mobile   Small,[name] (Dr) Alanson Puls [phone]    Small, [name]   [phone]   Small, [name] [phone]         Chief Complaint  Patient presents with  . Medical Management of Chronic Issues    thyroid, constipation, blood pressure    HPI: This is a 80 y.o. female resident of [place], [facility] Living section evaluated today for routine follow up. Has a UTI which was found lat week- currently taking Macrobid which she started 3 days ago which has helped with symptoms of dysuria. Long standing I issues, prominently constipation. Currently treats with Miralax, fiber in diet with good result. However has had some diarrhea as well. Chronic hemorrhoids.  Very poor hearing, wears bilateral aides.   REVIEW OF SYSTEMS  DATA OBTAINED: from patient GENERAL: Feels well   No recent fever, fatigue, change in appetite or weight SKIN: No itch, rash or open wounds EYES: No eye pain, itching.  Has dry eyes No change in vision EARS: No earache, tinnitus,Very poor hearing NOSE: No congestion, drainage or bleeding MOUTH/THROAT: No mouth or tooth pain  No sore throat  Dry mouth  No difficulty chewing or swallowing RESPIRATORY: No cough, wheezing, SOB CARDIAC: No chest pain, palpitations  No edema. GI: No abdominal pain  No nausea, vomiting,diarrhea or constipation  No heartburn or reflux  GU: No dysuria, frequency or urgency now that she is on antibiotics. Bladder prolapse, difficulty emptying bladder sometimes   MUSCULOSKELETAL: No joint pain, swelling or stiffness  No back pain  No muscle ache, pain, weakness  Gait is steady, exercises regularly  No recent falls.  NEUROLOGIC: No dizziness, fainting, headache, numbness   No change in mental status.  PSYCHIATRIC: No feelings of anxiety, depression  Sleeps well.       Allergies  Allergen Reactions  . Amoxil [Amoxicillin] Diarrhea  . Crab [Shellfish Allergy]     sensitivity  . Demerol   . Meperidine Hcl Other (See Comments)    Drop in blood pressure  . Neosporin [Neomycin-Bacitracin Zn-Polymyx]   . Infed [Iron Dextran] Rash    Became very red over face, scalp, and arms    MEDICATIONS -     Medication List       This list is accurate as of: 01/05/14 12:10 PM.  Always use your most recent med list.               ALIGN 4 MG Caps  Take 1 tablet by mouth daily.     ALPRAZolam 0.25 MG tablet  Commonly known as:  XANAX  Take 1 tablet (0.25 mg total) by mouth every 6 (six) hours as needed for anxiety.     antiseptic oral rinse Liqd  15 mLs by Mouth Rinse route as needed for dry mouth.     BEANO PO  Take 2 tablets by mouth every morning before fiber intake and every evening     BIOTIN 5000 5 MG Caps  Generic drug:  Biotin  Take by mouth. Take one daily for hair/nails     estradiol 0.1 MG/GM vaginal cream  Commonly known as:  ESTRACE  Place 1 Applicatorful vaginally. 1-2 times a week     fluticasone 50 MCG/ACT nasal spray  Commonly known as:  FLONASE  1 spray to each nostril daily for allergy symptoms     hydrocortisone 25 MG suppository  Commonly known as:  ANUSOL-HC  Place 1 suppository (25 mg total) rectally 2 (two) times daily.     hydrocortisone valerate ointment 0.2 %  Commonly known as:  WEST-CORT  APPLY TWICE DAILY X1 WK. THEN, APPLY VASELINE TWICE DAILY TO AREA. MAY USE THIS CREAM ONCE/WEEK     levothyroxine 75 MCG tablet  Commonly known as:  SYNTHROID, LEVOTHROID  Take one tablet daily before breakfast for thyroid     MIRALAX powder  Generic drug:  polyethylene glycol powder  Take 17 g by mouth daily.     pramoxine 1 % foam  Commonly known as:  PROCTOFOAM  Place 1 application rectally daily as needed for itching.  Can insert internally     ranitidine 150 MG tablet  Commonly known as:  ZANTAC  Take 150 mg by mouth 2 (two) times daily.     [place] PRO-B Caps  Take 1 by mouth every day     TUMS 500 MG chewable tablet  Generic drug:  calcium carbonate  Chew 1 tablet by mouth as needed.          Laboratory Studies: Lab Results  Component Value Date   WBC 6.1 03/04/2013   HGB 12.6 03/04/2013   HCT 37.1 03/04/2013   MCV 92.3 03/04/2013   PLT 184.0 03/04/2013   Lab Results  Component Value Date   NA 140 07/05/2013   K 3.6 07/05/2013   BUN 17 07/05/2013   CREATININE 0.6 07/05/2013   Lab Results  Component Value Date   NA 140 07/05/2013   K 3.6 07/05/2013   CL 101 07/05/2013   CO2 30 07/05/2013   GLUCOSE 80 07/05/2013   BUN 17 07/05/2013   CREATININE 0.6 07/05/2013   CALCIUM 9.3 07/05/2013   ALBUMIN 4.0 07/05/2013   AST 41* 07/05/2013   ALT 22 07/05/2013   ALKPHOS 98 07/05/2013   BILITOT 0.5 07/05/2013   GFRNONAA 62.54 01/06/2009   GFRAA 122 12/02/2006   Lab Results  Component Value Date   VITAMINB12 >1500* 07/05/2013   Lab Results  Component Value Date   TSH 1.12 07/05/2013        Past Medical History  Diagnosis Date  . 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 Osteoarthrosis, unspecified whether generalized or localized, pelvic region and thigh   . Full incontinence of feces   . Asymptomatic varicose veins   . Anemia, unspecified   . Idiopathic osteoporosis   . Melanoma of skin, site unspecified   . Irritable bowel syndrome   . Internal hemorrhoids without mention of complication     bleed easily  . Carotid bruit     hx of  . Peripheral neuropathy     hands/ feet  . HTN (hypertension)   . Esophageal reflux   . Diverticulosis   . Dyslipidemia   . Spinal stenosis, unspecified region other than cervical   . Hypothyroidism   . History of colon cancer     Adenocarcinoma of right colon, stage T2, N1.s/p resection/chemotherapy 2002  . MVP (mitral valve prolapse)   . Female bladder  prolapse, acquired 07/17/2010  . Vaginal bleeding, abnormal   . Varicose veins   . Idiopathic osteoporosis   . Vaginitis, atrophic   . Fibroid   . Carotid bruit     doppler normal in the past  . Hemorrhoid  1963 surgical excision  . OA (osteoarthritis)     left hip  . Hx of colonic polyps     1965 surgical excision   Past Surgical History  Procedure Laterality Date  . Hemicolectomy  2002    right  . Colonoscopy w/ polypectomy  2006    3 mm polyp destroyed, diverticulosis  . Esophagogastroduodenoscopy  2009    mild gastritis  . Hemorrhoid surgery  1965  . Hysteroscopy    . Dilation and curettage of uterus    . Melanoma excision     Family Status  Relation Status Death Age  . Father Deceased   . Mother Deceased   . Sister Deceased   . Son Alive   . Son Alive    History   Social History Narrative   widowed after a very long and happy marriage almost 27 years. 2 sons - one a Advice worker, several grandchildren. Close and attentive family. Lives alone at [place], since 7/ 2004 : paricipates in yoga and exercise. I-ADLs including driving. Supportive family who checks on her and visits her regularly.   Advanced Care planning documents: DNR, MOST form, Living Will   Walks with walker   Exercise: M-W-F class          PHYSICAL EXAM Filed Vitals:   01/05/14 1140  BP: 124/64  Pulse: 80  Weight: 98 lb (44.453 kg)   Body mass index is 20.49 kg/(m^2).  GENERAL APPEARANCE: No acute distress, appropriately groomed, thin body habitus,Alert, pleasant, conversant. Speech mildly dysarthric SKIN: No diaphoresis, rash, unusual lesions, wounds HEAD: Normocephalic, atraumatic EYES: Conjunctiva clear, lid ectropian bilateral. Pupils round, reactive. EOMs intact.  EARS: External exam WNL,  Hearing poor despite hearing aids. NOSE: No deformity or discharge. MOUTH/THROAT: Lips w/o lesions. Oral mucosa, tongue moist, w/o lesion. NECK: Supple, full ROM. No thyroid  tenderness, enlargement or nodule RESPIRATORY: Breathing is even, unlabored. Lung sounds are clear CARDIOVASCULAR: Heart RRR. No murmur or extra heart sounds  EDEMA: No peripheral edema.  GASTROINTESTINAL: Abdomen is soft, non-tender, not distended w/ normal bowel sounds MUSCULOSKELETAL: Moves all extremities with full ROM, strength and tone.  Gait is steady NEUROLOGIC: Oriented to time, place, person. Cranial nerves 2-12 grossly intact, no tremor. Marland Kitchen PSYCHIATRIC: Mood and affect appropriate to situation   ASSESSMENT/PLAN   ANEMIA, NORMOCYTIC, CHRONIC Most recent CBC satisfactory, will follow up at TSH tim   HYPOTHYROIDISM Most recent TSH satisfactory, will follow up lab   Dry mouth No lab evidence of Sjogren's. Patient has worked with [place], is using artificial saliva product. Condition places her at increased risk for oral candidiasis.   Constipation Being managed by miralax and fiber  History of colon cancer Stage III colon cancer dating back to 2002 status post surgical resection and adjuvant chemotherapy- on previous follow up oncology reports no need for further studies and to follow up as needed only   GERD conts on zantac with good results  UTI -improved, cont to macrobid  To follow up in 3 months with Dr Nyoka Cowden or sooner if needed

## 2014-01-05 NOTE — Telephone Encounter (Signed)
Xanax refill rx sent to [facility]  LVM informing pt that request has been sent.

## 2014-01-11 DIAGNOSIS — E039 Hypothyroidism, unspecified: Secondary | ICD-10-CM | POA: Diagnosis not present

## 2014-01-11 DIAGNOSIS — D649 Anemia, unspecified: Secondary | ICD-10-CM | POA: Diagnosis not present

## 2014-01-11 DIAGNOSIS — I10 Essential (primary) hypertension: Secondary | ICD-10-CM | POA: Diagnosis not present

## 2014-01-11 LAB — CBC AND DIFFERENTIAL
HEMATOCRIT: 37 % (ref 36–46)
HEMOGLOBIN: 12.6 g/dL (ref 12.0–16.0)
Platelets: 245 10*3/uL (ref 150–399)
WBC: 6.1 10^3/mL

## 2014-01-11 LAB — HEPATIC FUNCTION PANEL
ALK PHOS: 174 U/L — AB (ref 25–125)
ALT: 50 U/L — AB (ref 7–35)
AST: 44 U/L — AB (ref 13–35)
Bilirubin, Total: 0.5 mg/dL

## 2014-01-11 LAB — BASIC METABOLIC PANEL
BUN: 17 mg/dL (ref 4–21)
Creatinine: 0.6 mg/dL (ref <=1.1)
GLUCOSE: 93 mg/dL
Potassium: 3.9 mmol/L (ref 3.4–5.3)
SODIUM: 142 mmol/L (ref 137–147)

## 2014-01-11 LAB — TSH: TSH: 1.07 u[IU]/mL (ref <=5.90)

## 2014-01-14 ENCOUNTER — Other Ambulatory Visit: Payer: Self-pay | Admitting: *Deleted

## 2014-01-14 ENCOUNTER — Telehealth: Payer: Self-pay | Admitting: *Deleted

## 2014-01-14 DIAGNOSIS — Z85038 Personal history of other malignant neoplasm of large intestine: Secondary | ICD-10-CM

## 2014-01-14 NOTE — Telephone Encounter (Signed)
Spoke with patient regarding her CT Abd/Pelvis, scheduled for 01/26/2014 @ 10:30 am. Informed patient that she must have someone pick up her contrast from [facility], and they will give her instructions. I also called Bernadette at [facility] to let her know of the date of the CT.

## 2014-01-20 ENCOUNTER — Other Ambulatory Visit: Payer: Medicare Other

## 2014-01-26 ENCOUNTER — Ambulatory Visit
Admission: RE | Admit: 2014-01-26 | Discharge: 2014-01-26 | Disposition: A | Discharge status: Home / Self Care | Payer: Medicare Other | Source: Ambulatory Visit | Attending: Internal Medicine | Admitting: Internal Medicine

## 2014-01-26 DIAGNOSIS — K8020 Calculus of gallbladder without cholecystitis without obstruction: Secondary | ICD-10-CM | POA: Diagnosis not present

## 2014-01-26 DIAGNOSIS — Z85038 Personal history of other malignant neoplasm of large intestine: Secondary | ICD-10-CM

## 2014-01-26 DIAGNOSIS — C189 Malignant neoplasm of colon, unspecified: Secondary | ICD-10-CM | POA: Diagnosis not present

## 2014-01-26 MED ORDER — IOHEXOL 300 MG/ML  SOLN
100.0000 mL | Freq: Once | INTRAMUSCULAR | Status: AC | PRN
  Administered 2014-01-26: 100 mL via INTRAVENOUS

## 2014-01-26 MED ORDER — IOHEXOL 300 MG/ML  SOLN
30.0000 mL | Freq: Once | INTRAMUSCULAR | Status: AC | PRN
  Administered 2014-01-26: 30 mL via ORAL

## 2014-01-30 ENCOUNTER — Other Ambulatory Visit: Payer: Self-pay | Admitting: Gynecology

## 2014-01-31 ENCOUNTER — Encounter: Payer: Self-pay | Admitting: Internal Medicine

## 2014-01-31 ENCOUNTER — Non-Acute Institutional Stay: Payer: Medicare Other | Admitting: Internal Medicine

## 2014-01-31 VITALS — BP 120/68 | HR 72 | Wt 99.0 lb

## 2014-01-31 DIAGNOSIS — I10 Essential (primary) hypertension: Secondary | ICD-10-CM | POA: Diagnosis not present

## 2014-01-31 DIAGNOSIS — E039 Hypothyroidism, unspecified: Secondary | ICD-10-CM

## 2014-01-31 DIAGNOSIS — G609 Hereditary and idiopathic neuropathy, unspecified: Secondary | ICD-10-CM | POA: Diagnosis not present

## 2014-01-31 DIAGNOSIS — N8110 Cystocele, unspecified: Secondary | ICD-10-CM

## 2014-01-31 DIAGNOSIS — C649 Malignant neoplasm of unspecified kidney, except renal pelvis: Secondary | ICD-10-CM

## 2014-01-31 DIAGNOSIS — N8111 Cystocele, midline: Secondary | ICD-10-CM

## 2014-01-31 DIAGNOSIS — C189 Malignant neoplasm of colon, unspecified: Secondary | ICD-10-CM

## 2014-01-31 DIAGNOSIS — E785 Hyperlipidemia, unspecified: Secondary | ICD-10-CM | POA: Diagnosis not present

## 2014-01-31 DIAGNOSIS — M48061 Spinal stenosis, lumbar region without neurogenic claudication: Secondary | ICD-10-CM | POA: Diagnosis not present

## 2014-01-31 DIAGNOSIS — R748 Abnormal levels of other serum enzymes: Secondary | ICD-10-CM

## 2014-01-31 NOTE — Progress Notes (Signed)
Patient ID: [name] Small, female   DOB: 05-11-1917, 80 y.o.   MRN: [id number]    Location:  [facility] (12)    Allergies  Allergen Reactions  . Amoxil [Amoxicillin] Diarrhea  . Crab [Shellfish Allergy]     sensitivity  . Demerol   . Meperidine Hcl Other (See Comments)    Drop in blood pressure  . Neosporin [Neomycin-Bacitracin Zn-Polymyx]   . Infed [Iron Dextran] Rash    Became very red over face, scalp, and arms    Chief Complaint  Patient presents with  . Results    CT abdomen/pelvis done 01/26/14    HPI:  This is a previous patient of Dr. Fredric Dine. Dr. Salem Senate is her nephew.  She has a previous history of colon cancer in 2002. She has undergone resection and chemotherapy. There is no evidence of relapse. Colonoscopy was done in 2006 she had diverticulosis and a 3 mm polyp which was destroyed.  Patient was seen by Seward Carol, NP 01/05/14. Following that visit she had a CT scan of the abdomen. It showed a new 1.8 cm inhomogenous enhancing mass in the lateral aspect of the lower pole left kidney. There was a single tiny stone in the gallbladder. There was one diverticulum in the colon, but no masses. Moderate spinal stenosis at L2-3 and L3-4 and severe spinal stenosis at L4-5 with almost complete obliteration of the thecal sac was demonstrated. There is multilevel severe degenerative disc and joint disease.  Patient has trouble emptying her bladder and with urinary retention. She has appointment with Dr. Gaynelle Arabian.  There is a neuropathy of the feet.  Lab on 01/11/14 showed elevation of liver enzymes with alkaline phosphatase 174, SGOT 44, and SGPT 50.  She has lost about 3 pounds of weight recently.  Gait is unsteady and she is using a walker.  Medications: Patient's Medications  New Prescriptions   No medications on file  Previous Medications   ALPHA-D-GALACTOSIDASE (BEANO PO)    Take 2  tablets by mouth every morning before fiber intake and every evening   ALPRAZOLAM (XANAX) 0.25 MG TABLET    Take 1 tablet (0.25 mg total) by mouth every 6 (six) hours as needed for anxiety.   ANTISEPTIC ORAL RINSE (BIOTENE) LIQD    15 mLs by Mouth Rinse route as needed for dry mouth.   BIOTIN (BIOTIN 5000) 5 MG CAPS    Take by mouth. Take one daily for hair/nails   CALCIUM CARBONATE (TUMS) 500 MG CHEWABLE TABLET    Chew 1 tablet by mouth as needed.    ESTRADIOL (ESTRACE) 0.1 MG/GM VAGINAL CREAM    Place 1 Applicatorful vaginally. 1-2 times a week   ESTRADIOL POWD    Sig: Estradiol .02%  1 ML Prefilled Applicator  Sig: apply vaginally twice a week  #90 Day Supply with 0 refills   FLUTICASONE (FLONASE) 50 MCG/ACT NASAL SPRAY    1 spray to each nostril daily for allergy symptoms   HYDROCORTISONE (ANUSOL-HC) 25 MG SUPPOSITORY    Place 1 suppository (25 mg total) rectally 2 (two) times daily.   HYDROCORTISONE VALERATE OINTMENT (WEST-CORT) 0.2 %    APPLY TWICE DAILY X1 WK. THEN, APPLY VASELINE TWICE DAILY TO AREA. MAY USE THIS CREAM ONCE/WEEK   LACTOBACILLUS (REPHRESH PRO-B) CAPS    Take 1 by mouth every day    LEVOTHYROXINE (SYNTHROID, LEVOTHROID) 75 MCG TABLET    Take one tablet daily before breakfast  for thyroid   POLYETHYLENE GLYCOL POWDER (MIRALAX) POWDER    Take 17 g by mouth daily.     PRAMOXINE (PROCTOFOAM) 1 % FOAM    Place 1 application rectally daily as needed for itching. Can insert internally   PROBIOTIC PRODUCT (ALIGN) 4 MG CAPS    Take 1 tablet by mouth daily.     RANITIDINE (ZANTAC) 150 MG TABLET    Take 150 mg by mouth 2 (two) times daily.  Modified Medications   No medications on file  Discontinued Medications   No medications on file     Review of Systems  Constitutional: Positive for appetite change and unexpected weight change. Negative for fever, chills, diaphoresis, activity change and fatigue.  HENT: Positive for hearing loss. Negative for congestion, ear discharge, ear  pain, postnasal drip, rhinorrhea, sore throat, tinnitus, trouble swallowing and voice change.        Dry mouth.  Eyes: Negative for pain, redness, itching and visual disturbance.       Dry eyes  Respiratory: Negative for cough, choking, shortness of breath and wheezing.   Cardiovascular: Negative for chest pain, palpitations and leg swelling.  Gastrointestinal: Positive for constipation. Negative for nausea, abdominal pain, diarrhea and abdominal distention.       Elevated liver enzymes noted on recent complete metabolic panel.  Endocrine: Negative for cold intolerance, heat intolerance, polydipsia, polyphagia and polyuria.  Genitourinary: Negative for dysuria, urgency, frequency, hematuria, flank pain, vaginal discharge, difficulty urinating and pelvic pain.       Some difficulty in emptying bladder.  Musculoskeletal: Positive for gait problem (using walker). Negative for myalgias, back pain, arthralgias, neck pain and neck stiffness.       Significant lumbar stenoses.  Skin: Negative for color change, pallor and rash.       History melanoma.  Allergic/Immunologic: Negative.   Neurological: Negative for dizziness, tremors, seizures, syncope, weakness, numbness and headaches.  Hematological: Negative for adenopathy. Does not bruise/bleed easily.  Psychiatric/Behavioral: Negative for suicidal ideas, hallucinations, behavioral problems, confusion, sleep disturbance, dysphoric mood and agitation. The patient is not nervous/anxious and is not hyperactive.     Filed Vitals:   01/31/14 1606  BP: 120/68  Pulse: 72  Weight: 99 lb (44.906 kg)   Body mass index is 20.7 kg/(m^2).  Physical Exam  Constitutional: She is oriented to person, place, and time. She appears well-developed and well-nourished. No distress.  HENT:  Right Ear: External ear normal.  Left Ear: External ear normal.  Nose: Nose normal.  Mouth/Throat: Oropharynx is clear and moist. No oropharyngeal exudate.  Bilateral  ectropion.  Eyes: Conjunctivae and EOM are normal. Pupils are equal, round, and reactive to light. No scleral icterus.  Neck: No JVD present. No tracheal deviation present. No thyromegaly present.  Cardiovascular: Normal rate, regular rhythm, normal heart sounds and intact distal pulses.  Exam reveals no gallop and no friction rub.   No murmur heard. Pulmonary/Chest: Effort normal. No respiratory distress. She has no wheezes. She has no rales. She exhibits no tenderness.  Abdominal: She exhibits no distension and no mass. There is no tenderness.  Musculoskeletal: Normal range of motion. She exhibits no edema or tenderness.  Lymphadenopathy:    She has no cervical adenopathy.  Neurological: She is alert and oriented to person, place, and time. No cranial nerve deficit. Coordination normal.  Skin: No rash noted. She is not diaphoretic. No erythema. No pallor.  Psychiatric: She has a normal mood and affect. Her behavior is normal. Judgment and  thought content normal.     Labs reviewed: Orders Only on 01/14/2014  Component Date Value Ref Range Status  . Hemoglobin 01/11/2014 12.6  12.0 - 16.0 g/dL Final  . HCT 01/11/2014 37  36 - 46 % Final  . Platelets 01/11/2014 245  150 - 399 K/L Final  . WBC 01/11/2014 6.1   Final  . Glucose 01/11/2014 93   Final  . Creatinine 01/11/2014 0.6  .5 - 1.1 mg/dL Final  . Potassium 01/11/2014 3.9  3.4 - 5.3 mmol/L Final  . Sodium 01/11/2014 142  137 - 147 mmol/L Final  . Alkaline Phosphatase 01/11/2014 174* 25 - 125 U/L Final  . ALT 01/11/2014 50* 7 - 35 U/L Final  . AST 01/11/2014 44* 13 - 35 U/L Final  . TSH 01/11/2014 1.07  .41 - 5.90 uIU/mL Final  . BUN 01/11/2014 17  4 - 21 mg/dL Final  . Bilirubin, Total 01/11/2014 0.5   Final  Office Visit on 12/20/2013  Component Date Value Ref Range Status  . Color, Urine 12/20/2013 YELLOW  YELLOW Final  . APPearance 12/20/2013 CLEAR  CLEAR Final  . Specific Gravity, Urine 12/20/2013 1.010  1.005 - 1.030  Final  . pH 12/20/2013 6.0  5.0 - 8.0 Final  . Glucose, UA 12/20/2013 NEG  NEG mg/dL Final  . Bilirubin Urine 12/20/2013 NEG  NEG Final  . Ketones, ur 12/20/2013 NEG  NEG mg/dL Final  . Hgb urine dipstick 12/20/2013 SMALL* NEG Final  . Protein, ur 12/20/2013 NEG  NEG mg/dL Final  . Urobilinogen, UA 12/20/2013 0.2  0.0 - 1.0 mg/dL Final  . Nitrite 12/20/2013 NEG  NEG Final  . Leukocytes, UA 12/20/2013 MOD* NEG Final  . Squamous Epithelial / LPF 12/20/2013 RARE  RARE Final  . Crystals 12/20/2013 NONE SEEN  NONE SEEN Final  . Casts 12/20/2013 NONE SEEN  NONE SEEN Final  . WBC, UA 12/20/2013 3-6* <3 WBC/hpf Final  . RBC / HPF 12/20/2013 0-2  <3 RBC/hpf Final  . Bacteria, UA 12/20/2013 MODERATE  RARE Final  . Yeast Wet Prep HPF POC 12/20/2013 NONE SEEN  NONE SEEN Final  . Trich, Wet Prep 12/20/2013 NONE SEEN  NONE SEEN Final  . Clue Cells Wet Prep HPF POC 12/20/2013 NONE SEEN  NONE SEEN Final  . WBC, Wet Prep HPF POC 12/20/2013 TNTC* NONE SEEN Final   Comment: AMINE TEST NEG                          TOO NUMEROUS TO COUNT BACTERIA                          MANY EPITHELIAL CELLS PRESENT  . Colony Count 12/20/2013 30,000 COLONIES/ML   Final  . Organism ID, Bacteria 12/20/2013 Multiple bacterial morphotypes present, none   Final  . Organism ID, Bacteria 12/20/2013 predominant. Suggest appropriate recollection if    Final  . Organism ID, Bacteria 12/20/2013 clinically indicated.   Final    Ct Abdomen Pelvis W Contrast  01/26/2014   CLINICAL DATA:  Colon cancer follow-up.  EXAM: CT ABDOMEN AND PELVIS WITH CONTRAST  TECHNIQUE: Multidetector CT imaging of the abdomen and pelvis was performed using the standard protocol following bolus administration of intravenous contrast.  CONTRAST:  7mL OMNIPAQUE IOHEXOL 300 MG/ML SOLN, 121mL OMNIPAQUE IOHEXOL 300 MG/ML SOLN  COMPARISON:  CT scan dated 06/26/2006  FINDINGS: There is a new 1.8 cm inhomogeneous enhancing  mass on the lateral aspect of the lower pole  of the left kidney. The patient has multiple new small low-density low-density lesions in the right kidney which are too small to characterize.  There is a single tiny stone in the otherwise normal appearing gallbladder. The liver and biliary tree are normal. Spleen, pancreas, and adrenal glands are normal. There is no adenopathy.  There is least 1 diverticulum in the distal descending colon. Uterus and ovaries are normal. The bladder is prominent with some redundancy of the wall but there are no masses.  The patient has moderate spinal stenosis at L2-3 and L3-4 and severe spinal stenosis at L4-5 with almost complete obliteration of the thecal sac. There is multilevel severe degenerative disc and joint disease with a grade 1 spondylolisthesis at L4-5.  IMPRESSION: 1. New enhancing 1.8 cm mass on the lower pole of the left kidney worrisome for renal cell carcinoma. 2. Single tiny gallstone. 3. Multiple tiny low-density areas in the right kidney, most likely cysts but there to small to characterize. 4. No evidence of recurrent colon cancer. 5. Severe spinal stenosis at L4-5.   Electronically Signed   By: Rozetta Nunnery M.D.   On: 01/26/2014 15:20     Assessment/Plan  1. Colon cancer There is no direct evidence of relapse. I am concerned about the elevated liver enzymes.  2. Female bladder prolapse, acquired To see urologist  3. Essential hypertension controlled  4. Hyperlipidemia controlled  5. Hypothyroidism, unspecified hypothyroidism type compensated  6. Hereditary and idiopathic peripheral neuropathy It is possible that the neuropathy is related at least in part to the Lumbar spinal stenosis.  7. Renal cancer, unspecified laterality Worrisome lesion in the kidney. She will be seeing urologist soon.  8. Spinal stenosis of lumbar region observe  9. Elevated liver enzymes -CMP in 2 months

## 2014-02-01 DIAGNOSIS — C649 Malignant neoplasm of unspecified kidney, except renal pelvis: Secondary | ICD-10-CM | POA: Diagnosis not present

## 2014-02-01 DIAGNOSIS — R339 Retention of urine, unspecified: Secondary | ICD-10-CM | POA: Diagnosis not present

## 2014-02-01 DIAGNOSIS — N368 Other specified disorders of urethra: Secondary | ICD-10-CM | POA: Diagnosis not present

## 2014-02-01 DIAGNOSIS — N390 Urinary tract infection, site not specified: Secondary | ICD-10-CM | POA: Diagnosis not present

## 2014-02-01 DIAGNOSIS — N952 Postmenopausal atrophic vaginitis: Secondary | ICD-10-CM | POA: Diagnosis not present

## 2014-02-08 DIAGNOSIS — H6120 Impacted cerumen, unspecified ear: Secondary | ICD-10-CM | POA: Diagnosis not present

## 2014-02-08 DIAGNOSIS — H903 Sensorineural hearing loss, bilateral: Secondary | ICD-10-CM | POA: Diagnosis not present

## 2014-02-08 DIAGNOSIS — H608X9 Other otitis externa, unspecified ear: Secondary | ICD-10-CM | POA: Diagnosis not present

## 2014-03-11 ENCOUNTER — Encounter: Payer: Self-pay | Admitting: Cardiology

## 2014-03-11 ENCOUNTER — Ambulatory Visit (INDEPENDENT_AMBULATORY_CARE_PROVIDER_SITE_OTHER): Payer: Medicare Other | Admitting: Cardiology

## 2014-03-11 VITALS — BP 122/58 | HR 88 | Ht <= 58 in | Wt 99.0 lb

## 2014-03-11 DIAGNOSIS — I10 Essential (primary) hypertension: Secondary | ICD-10-CM

## 2014-03-11 NOTE — Progress Notes (Signed)
Patient ID: [name] Small, female   DOB: 1916-10-17, 80 y.o.   MRN: [id number]    HPI  The patient is seen today to followup her cardiac status. From the cardiac viewpoint she is doing remarkably well. She is 80 years of age and is feeling her age. She's not having any chest pain or difficulties.  Allergies  Allergen Reactions  . Amoxil [Amoxicillin] Diarrhea  . Crab [Shellfish Allergy]     sensitivity  . Demerol   . Meperidine Hcl Other (See Comments)    Drop in blood pressure  . Neosporin [Neomycin-Bacitracin Zn-Polymyx]   . Infed [Iron Dextran] Rash    Became very red over face, scalp, and arms    Current Outpatient Prescriptions  Medication Sig Dispense Refill  . Alpha-D-Galactosidase (BEANO PO) Take 2 tablets by mouth every morning before fiber intake and every evening      . ALPRAZolam (XANAX) 0.25 MG tablet Take 0.25 mg by mouth at bedtime.      Marland Kitchen antiseptic oral rinse (BIOTENE) LIQD 15 mLs by Mouth Rinse route as needed for dry mouth.      . calcium carbonate (TUMS) 500 MG chewable tablet Chew 1 tablet by mouth as needed.       Marland Kitchen estradiol (ESTRACE) 0.1 MG/GM vaginal cream Place 1 Applicatorful vaginally. 1-2 times a week      . fluticasone (FLONASE) 50 MCG/ACT nasal spray 1 spray to each nostril daily for allergy symptoms  16 g  5  . hydrocortisone (ANUSOL-HC) 25 MG suppository Place 25 mg rectally as needed.      . hydrocortisone valerate ointment (WEST-CORT) 0.2 % APPLY TWICE DAILY X1 WK. THEN, APPLY VASELINE TWICE DAILY TO AREA. MAY USE THIS CREAM ONCE/WEEK  45 g  1  . Lactobacillus ([place] PRO-B) CAPS Take 1 by mouth every day       . levothyroxine (SYNTHROID, LEVOTHROID) 75 MCG tablet Take one tablet daily before breakfast for thyroid  30 tablet  5  . Multiple Vitamins-Minerals (ICAPS AREDS FORMULA PO) Take by mouth 2 (two) times daily.      . polyethylene glycol powder (MIRALAX) powder Take 17 g by mouth as needed.       . Probiotic Product (ALIGN) 4 MG CAPS  Take 1 tablet by mouth daily.        . ranitidine (ZANTAC) 150 MG tablet Take 150 mg by mouth 2 (two) times daily.       No current facility-administered medications for this visit.    History   Social History  . Marital Status: Widowed    Spouse Name: N/A    Number of Children: 2  . Years of Education: N/A   Occupational History  . retired    Social History Main Topics  . Smoking status: Former Smoker -- 2 years    Types: Cigarettes    Quit date: 09/09/1943  . Smokeless tobacco: Never Used  . Alcohol Use: 0.0 oz/week     Comment: occas wine 2 glasses a week  . Drug Use: No  . Sexual Activity: No   Other Topics Concern  . Not on file   Social History Narrative   widowed after a very long and happy marriage almost 11 years. 2 sons - one a Advice worker, several grandchildren. Close and attentive family. Lives alone at [facility], since 7/ 2004 : paricipates in yoga and exercise. I-ADLs including driving. Supportive family who checks on her and visits her regularly.   Advanced Care  planning documents: DNR, MOST form, Living Will   Walks with walker   Exercise: M-W-F class         Family History  Problem Relation Age of Onset  . Stomach cancer Maternal Grandfather   . Colon cancer Neg Hx   . Heart disease Father   . Diabetes Father   . Heart disease Sister     Past Medical History  Diagnosis Date  . 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 Osteoarthrosis, unspecified whether generalized or localized, pelvic region and thigh   . Full incontinence of feces   . Asymptomatic varicose veins   . Anemia, unspecified   . Idiopathic osteoporosis   . Melanoma of skin, site unspecified   . Irritable bowel syndrome   . Internal hemorrhoids without mention of complication     bleed easily  . Carotid bruit     hx of  . Peripheral neuropathy     hands/ feet  . HTN (hypertension)   . Esophageal reflux   . Diverticulosis   . Dyslipidemia   . Spinal stenosis,  unspecified region other than cervical   . Hypothyroidism   . History of colon cancer     Adenocarcinoma of right colon, stage T2, N1.s/p resection/chemotherapy 2002  . MVP (mitral valve prolapse)   . Female bladder prolapse, acquired 07/17/2010  . Vaginal bleeding, abnormal   . Varicose veins   . Idiopathic osteoporosis   . Vaginitis, atrophic   . Fibroid   . Carotid bruit     doppler normal in the past  . Hemorrhoid     1963 surgical excision  . OA (osteoarthritis)     left hip  . Hx of colonic polyps     1965 surgical excision    Past Surgical History  Procedure Laterality Date  . Hemicolectomy  2002    right  . Colonoscopy w/ polypectomy  2006    3 mm polyp destroyed, diverticulosis  . Esophagogastroduodenoscopy  2009    mild gastritis  . Hemorrhoid surgery  1965  . Hysteroscopy    . Dilation and curettage of uterus    . Melanoma excision      Patient Active Problem List   Diagnosis Date Noted  . HTN (hypertension)     Priority: High  . Carotid bruit     Priority: High  . Colon cancer 10/12/2013  . History of colon cancer   . Bilateral dry eyes 10/27/2012  . Dry mouth 10/27/2012  . Advanced care planning/counseling discussion 07/15/2012  . Knee pain, bilateral 10/09/2011  . PERSONAL HX COLONIC POLYPS 08/08/2010  . Female bladder prolapse, acquired 07/17/2010  . VAGINAL BLEEDING, ABNORMAL 06/28/2010  . OSTEOARTHRITIS, HIP, LEFT 06/28/2010  . Full incontinence of feces 03/23/2010  . VARICOSE VEINS, LOWER EXTREMITIES 01/06/2009  . ANEMIA, NORMOCYTIC, CHRONIC 12/03/2007  . VAGINITIS, ATROPHIC 12/03/2007  . Idiopathic osteoporosis 12/03/2007  . Melanoma of skin, site unspecified 07/13/2007  . Hemorrhoids, internal, with bleeding and prolapse 06/12/2007  . Irritable bowel syndrome 06/12/2007  . HYPOTHYROIDISM 02/25/2007  . HYPERLIPIDEMIA 02/25/2007  . NEUROPATHY, IDIOPATHIC PERIPHERAL 02/25/2007  . GASTROESOPHAGEAL REFLUX DISEASE 02/25/2007  . SPINAL  STENOSIS 02/25/2007  . ADENOCARCINOMA, COLON, HX OF 02/25/2007  . DIVERTICULOSIS, COLON, HX OF 02/25/2007    ROS   Patient denies fever, chills, headache, sweats, rash, change in vision, change in hearing, chest pain, cough, nausea or vomiting, urinary symptoms. All other systems are reviewed and are negative.  PHYSICAL EXAM  Patient is stable today.  Her mental status is completely appropriate. She is oriented to person time and place. Affect is normal. Head is atraumatic. Lungs are clear. Respiratory effort is nonlabored. Cardiac exam reveals S1 and S2. The abdomen is soft. There is no peripheral edema.  Filed Vitals:   03/11/14 1110  BP: 122/58  Pulse: 88  Height: 4\' 10"  (1.473 m)  Weight: 99 lb (44.906 kg)  SpO2: 99%     ASSESSMENT & PLAN

## 2014-03-11 NOTE — Patient Instructions (Signed)
**Note De-identified  Obfuscation** Your physician recommends that you continue on your current medications as directed. Please refer to the Current Medication list given to you today.  Your physician recommends that you schedule a follow-up appointment in: as needed  

## 2014-03-11 NOTE — Assessment & Plan Note (Addendum)
Blood pressure is controlled. No change in therapy.  She is not having any significant problems. She is aware that I will be retiring in October, 2016. She feels that she does not need any further regular cardiology followup. Therefore a cardiology followup appointment has not been made. I made it clear to her that we are available to help in any way.

## 2014-03-22 DIAGNOSIS — D649 Anemia, unspecified: Secondary | ICD-10-CM | POA: Diagnosis not present

## 2014-03-22 DIAGNOSIS — I10 Essential (primary) hypertension: Secondary | ICD-10-CM | POA: Diagnosis not present

## 2014-03-22 DIAGNOSIS — E039 Hypothyroidism, unspecified: Secondary | ICD-10-CM | POA: Diagnosis not present

## 2014-03-22 LAB — HEPATIC FUNCTION PANEL
ALK PHOS: 86 U/L (ref 25–125)
ALT: 13 U/L (ref 7–35)
AST: 27 U/L (ref 13–35)
Bilirubin, Total: 0.4 mg/dL

## 2014-03-22 LAB — BASIC METABOLIC PANEL
BUN: 19 mg/dL (ref 4–21)
CREATININE: 0.7 mg/dL (ref 0.5–1.1)
GLUCOSE: 88 mg/dL
Potassium: 4 mmol/L (ref 3.4–5.3)
Sodium: 143 mmol/L (ref 137–147)

## 2014-03-22 LAB — CBC AND DIFFERENTIAL
HCT: 36 % (ref 36–46)
Hemoglobin: 12.3 g/dL (ref 12.0–16.0)
PLATELETS: 180 10*3/uL (ref 150–399)
WBC: 4.4 10^3/mL

## 2014-03-22 LAB — TSH: TSH: 1.9 u[IU]/mL (ref 0.41–5.90)

## 2014-03-28 ENCOUNTER — Non-Acute Institutional Stay: Payer: Medicare Other | Admitting: Internal Medicine

## 2014-03-28 VITALS — BP 118/60 | HR 80 | Temp 98.3°F | Resp 14 | Wt 100.8 lb

## 2014-03-28 DIAGNOSIS — C649 Malignant neoplasm of unspecified kidney, except renal pelvis: Secondary | ICD-10-CM

## 2014-03-28 DIAGNOSIS — I10 Essential (primary) hypertension: Secondary | ICD-10-CM

## 2014-03-28 DIAGNOSIS — E039 Hypothyroidism, unspecified: Secondary | ICD-10-CM

## 2014-03-28 DIAGNOSIS — R748 Abnormal levels of other serum enzymes: Secondary | ICD-10-CM

## 2014-03-28 DIAGNOSIS — C189 Malignant neoplasm of colon, unspecified: Secondary | ICD-10-CM | POA: Diagnosis not present

## 2014-03-28 DIAGNOSIS — D649 Anemia, unspecified: Secondary | ICD-10-CM | POA: Diagnosis not present

## 2014-03-28 DIAGNOSIS — N8110 Cystocele, unspecified: Secondary | ICD-10-CM

## 2014-03-28 DIAGNOSIS — E785 Hyperlipidemia, unspecified: Secondary | ICD-10-CM

## 2014-03-28 DIAGNOSIS — Z85038 Personal history of other malignant neoplasm of large intestine: Secondary | ICD-10-CM | POA: Diagnosis not present

## 2014-03-28 NOTE — Progress Notes (Signed)
Patient ID: [name] Small, female   DOB: 1917/03/27, 80 y.o.   MRN: [id number]    [place]     Place of Service: [facility] ([facility]    Allergies  Allergen Reactions  . Amoxil [Amoxicillin] Diarrhea  . Crab [Shellfish Allergy]     sensitivity  . Demerol   . Meperidine Hcl Other (See Comments)    Drop in blood pressure  . Neosporin [Neomycin-Bacitracin Zn-Polymyx]   . Infed [Iron Dextran] Rash    Became very red over face, scalp, and arms    Chief Complaint  Patient presents with  . Medical Management of Chronic Issues    BP, anemia, elevated liver enzymes    HPI:  Worse problems with her bladder. Some frequency and occasional incontinence. Bladder feels full after voiding. Has seen DrMarland Kitchen Gaynelle Arabian in the past.  Possible renal cancer: There is a 1.8 cm mass in the lower pole left kidney. Dr. Gaynelle Arabian has advised her that this can be followed safely for the time being.  Colon cancer: No evidence of relapse  Anemia, unspecified anemia type: Resolved  History of malignant neoplasm of large intestine: No evidence of relapse  Hypothyroidism, unspecified hypothyroidism type: Compensated  Hyperlipidemia: Needs followup at future visit  Essential hypertension: Controlled  Female bladder prolapse, acquired:  contributes to incontinence    Medications: Patient's Medications  New Prescriptions   No medications on file  Previous Medications   ALPHA-D-GALACTOSIDASE (BEANO PO)    Take 2 tablets by mouth every morning before fiber intake and every evening   ALPRAZOLAM (XANAX) 0.25 MG TABLET    Take 0.25 mg by mouth at bedtime.   ANTISEPTIC ORAL RINSE (BIOTENE) LIQD    15 mLs by Mouth Rinse route as needed for dry mouth.   CALCIUM CARBONATE (TUMS) 500 MG CHEWABLE TABLET    Chew 1 tablet by mouth as needed.    ESTRADIOL (ESTRACE) 0.1 MG/GM VAGINAL CREAM    Place 1 Applicatorful vaginally. 1-2 times a week   FLUTICASONE (FLONASE) 50 MCG/ACT NASAL  SPRAY    1 spray to each nostril daily for allergy symptoms   HYDROCORTISONE (ANUSOL-HC) 25 MG SUPPOSITORY    Place 25 mg rectally as needed.   HYDROCORTISONE VALERATE OINTMENT (WEST-CORT) 0.2 %    APPLY TWICE DAILY X1 WK. THEN, APPLY VASELINE TWICE DAILY TO AREA. MAY USE THIS CREAM ONCE/WEEK   LACTOBACILLUS ([place]H PRO-B) CAPS    Take 1 by mouth every day    LEVOTHYROXINE (SYNTHROID, LEVOTHROID) 75 MCG TABLET    Take one tablet daily before breakfast for thyroid   MULTIPLE VITAMINS-MINERALS (ICAPS AREDS FORMULA PO)    Take by mouth 2 (two) times daily.   POLYETHYLENE GLYCOL POWDER (MIRALAX) POWDER    Take 17 g by mouth as needed.    PROBIOTIC PRODUCT (ALIGN) 4 MG CAPS    Take 1 tablet by mouth daily.     RANITIDINE (ZANTAC) 150 MG TABLET    Take 150 mg by mouth 2 (two) times daily.  Modified Medications   No medications on file  Discontinued Medications   No medications on file     Review of Systems  Constitutional: Negative for fever, chills, diaphoresis, activity change, appetite change, fatigue and unexpected weight change.  HENT: Positive for hearing loss and voice change. Negative for congestion, ear discharge, ear pain, postnasal drip, rhinorrhea, sore throat, tinnitus and trouble swallowing.   Eyes: Negative for pain, redness, itching and visual disturbance.  Respiratory: Negative for cough, choking,  shortness of breath and wheezing.   Cardiovascular: Negative for chest pain, palpitations and leg swelling.  Gastrointestinal: Negative for nausea, abdominal pain, diarrhea, constipation and abdominal distention.       Rare episodes of incontinence of stool  Endocrine: Negative for cold intolerance, heat intolerance, polydipsia, polyphagia and polyuria.  Genitourinary: Positive for urgency. Negative for dysuria, frequency, hematuria, flank pain, vaginal discharge, difficulty urinating and pelvic pain.       Some urinary incontinence  Musculoskeletal: Negative for arthralgias, back  pain, gait problem, myalgias, neck pain and neck stiffness.  Skin: Negative for color change, pallor and rash.  Allergic/Immunologic: Negative.   Neurological: Negative for dizziness, tremors, seizures, syncope, weakness, numbness and headaches.  Hematological: Negative for adenopathy. Does not bruise/bleed easily.       Hx anemia  Psychiatric/Behavioral: Negative for suicidal ideas, hallucinations, behavioral problems, confusion, sleep disturbance, dysphoric mood and agitation. The patient is not nervous/anxious and is not hyperactive.     Filed Vitals:   04/04/14 2108  BP: 118/60  Pulse: 80  Temp: 98.3 F (36.8 C)  Resp: 14  Weight: 100 lb 12.8 oz (45.723 kg)  SpO2: 95%   Body mass index is 21.07 kg/(m^2).  Physical Exam  Constitutional: She is oriented to person, place, and time. She appears well-developed and well-nourished. No distress.  HENT:  Right Ear: External ear normal.  Left Ear: External ear normal.  Nose: Nose normal.  Mouth/Throat: Oropharynx is clear and moist. No oropharyngeal exudate.  Dry mouth. Loss of hearing.  Eyes: Conjunctivae and EOM are normal. Pupils are equal, round, and reactive to light. No scleral icterus.  Corrective lenses  Neck: No JVD present. No tracheal deviation present. No thyromegaly present.  Cardiovascular: Normal rate, regular rhythm, normal heart sounds and intact distal pulses.  Exam reveals no gallop and no friction rub.   No murmur heard. Varicose veins of legs  Pulmonary/Chest: Effort normal. No respiratory distress. She has no wheezes. She has no rales. She exhibits no tenderness.  Abdominal: She exhibits no distension and no mass. There is no tenderness.  Musculoskeletal: Normal range of motion. She exhibits no edema and no tenderness.  Lymphadenopathy:    She has no cervical adenopathy.  Neurological: She is alert and oriented to person, place, and time. No cranial nerve deficit. Coordination normal.  Skin: No rash noted.  She is not diaphoretic. No erythema. No pallor.  Psychiatric: She has a normal mood and affect. Her behavior is normal. Judgment and thought content normal.     Labs reviewed: Orders Only on 01/14/2014  Component Date Value Ref Range Status  . Hemoglobin 01/11/2014 12.6  12.0 - 16.0 g/dL Final  . HCT 01/11/2014 37  36 - 46 % Final  . Platelets 01/11/2014 245  150 - 399 K/L Final  . WBC 01/11/2014 6.1   Final  . Glucose 01/11/2014 93   Final  . Creatinine 01/11/2014 0.6  .5 - 1.1 mg/dL Final  . Potassium 01/11/2014 3.9  3.4 - 5.3 mmol/L Final  . Sodium 01/11/2014 142  137 - 147 mmol/L Final  . Alkaline Phosphatase 01/11/2014 174* 25 - 125 U/L Final  . ALT 01/11/2014 50* 7 - 35 U/L Final  . AST 01/11/2014 44* 13 - 35 U/L Final  . TSH 01/11/2014 1.07  .41 - 5.90 uIU/mL Final  . BUN 01/11/2014 17  4 - 21 mg/dL Final  . Bilirubin, Total 01/11/2014 0.5   Final   01/26/14 CT abdomen and pelvis with contrast:  1. New enhancing 1.8 cm mass on the lower pole of the left kidney  worrisome for renal cell carcinoma.  2. Single tiny gallstone.  3. Multiple tiny low-density areas in the right kidney, most likely  cysts but there to small to characterize.  4. No evidence of recurrent colon cancer.  5. Severe spinal stenosis at L4-5.   Assessment/Plan  1. Colon cancer  no relapse  2. Anemia, unspecified anemia type Resolved   3. History of malignant neoplasm of large intestine  no relapse   4. Hypothyroidism, unspecified hypothyroidism type  compensated  5. Hyperlipidemia  stable   6. Essential hypertension  controlled   7. Female bladder prolapse, acquired Contributes incontinence  8. Elevated liver enzymes  follow up next visit  9. Renal cancer, unspecified laterality Continued followup with Dr. Gaynelle Arabian

## 2014-03-29 DIAGNOSIS — H6123 Impacted cerumen, bilateral: Secondary | ICD-10-CM | POA: Diagnosis not present

## 2014-03-29 DIAGNOSIS — H6093 Unspecified otitis externa, bilateral: Secondary | ICD-10-CM | POA: Diagnosis not present

## 2014-03-29 DIAGNOSIS — H903 Sensorineural hearing loss, bilateral: Secondary | ICD-10-CM | POA: Diagnosis not present

## 2014-03-30 DIAGNOSIS — Z23 Encounter for immunization: Secondary | ICD-10-CM | POA: Diagnosis not present

## 2014-03-31 DIAGNOSIS — R300 Dysuria: Secondary | ICD-10-CM | POA: Diagnosis not present

## 2014-03-31 DIAGNOSIS — N3941 Urge incontinence: Secondary | ICD-10-CM | POA: Diagnosis not present

## 2014-03-31 DIAGNOSIS — N390 Urinary tract infection, site not specified: Secondary | ICD-10-CM | POA: Diagnosis not present

## 2014-03-31 DIAGNOSIS — R339 Retention of urine, unspecified: Secondary | ICD-10-CM | POA: Diagnosis not present

## 2014-04-02 ENCOUNTER — Other Ambulatory Visit: Payer: Self-pay | Admitting: Geriatric Medicine

## 2014-04-04 ENCOUNTER — Telehealth: Payer: Self-pay | Admitting: *Deleted

## 2014-04-04 DIAGNOSIS — R748 Abnormal levels of other serum enzymes: Secondary | ICD-10-CM | POA: Insufficient documentation

## 2014-04-04 DIAGNOSIS — C649 Malignant neoplasm of unspecified kidney, except renal pelvis: Secondary | ICD-10-CM | POA: Insufficient documentation

## 2014-04-04 NOTE — Telephone Encounter (Signed)
Patient called and stated that her Rehab therapist, Leonor Liv is going to fax over an order for patient to get PT for balance for Dr. Nyoka Cowden to review and sign.

## 2014-04-05 ENCOUNTER — Other Ambulatory Visit: Payer: Self-pay

## 2014-04-15 ENCOUNTER — Encounter: Payer: Self-pay | Admitting: Internal Medicine

## 2014-04-15 DIAGNOSIS — R278 Other lack of coordination: Secondary | ICD-10-CM | POA: Diagnosis not present

## 2014-04-15 DIAGNOSIS — R2681 Unsteadiness on feet: Secondary | ICD-10-CM | POA: Diagnosis not present

## 2014-04-15 DIAGNOSIS — G603 Idiopathic progressive neuropathy: Secondary | ICD-10-CM | POA: Diagnosis not present

## 2014-04-18 ENCOUNTER — Encounter: Payer: Self-pay | Admitting: Cardiology

## 2014-04-18 DIAGNOSIS — R2681 Unsteadiness on feet: Secondary | ICD-10-CM | POA: Diagnosis not present

## 2014-04-18 DIAGNOSIS — R278 Other lack of coordination: Secondary | ICD-10-CM | POA: Diagnosis not present

## 2014-04-18 DIAGNOSIS — G603 Idiopathic progressive neuropathy: Secondary | ICD-10-CM | POA: Diagnosis not present

## 2014-04-22 DIAGNOSIS — R2681 Unsteadiness on feet: Secondary | ICD-10-CM | POA: Diagnosis not present

## 2014-04-22 DIAGNOSIS — R278 Other lack of coordination: Secondary | ICD-10-CM | POA: Diagnosis not present

## 2014-04-22 DIAGNOSIS — G603 Idiopathic progressive neuropathy: Secondary | ICD-10-CM | POA: Diagnosis not present

## 2014-04-26 DIAGNOSIS — G603 Idiopathic progressive neuropathy: Secondary | ICD-10-CM | POA: Diagnosis not present

## 2014-04-26 DIAGNOSIS — R278 Other lack of coordination: Secondary | ICD-10-CM | POA: Diagnosis not present

## 2014-04-26 DIAGNOSIS — R2681 Unsteadiness on feet: Secondary | ICD-10-CM | POA: Diagnosis not present

## 2014-05-02 DIAGNOSIS — G603 Idiopathic progressive neuropathy: Secondary | ICD-10-CM | POA: Diagnosis not present

## 2014-05-02 DIAGNOSIS — R2681 Unsteadiness on feet: Secondary | ICD-10-CM | POA: Diagnosis not present

## 2014-05-02 DIAGNOSIS — R278 Other lack of coordination: Secondary | ICD-10-CM | POA: Diagnosis not present

## 2014-05-05 DIAGNOSIS — R2681 Unsteadiness on feet: Secondary | ICD-10-CM | POA: Diagnosis not present

## 2014-05-05 DIAGNOSIS — R278 Other lack of coordination: Secondary | ICD-10-CM | POA: Diagnosis not present

## 2014-05-05 DIAGNOSIS — G603 Idiopathic progressive neuropathy: Secondary | ICD-10-CM | POA: Diagnosis not present

## 2014-05-09 DIAGNOSIS — H903 Sensorineural hearing loss, bilateral: Secondary | ICD-10-CM | POA: Diagnosis not present

## 2014-05-09 DIAGNOSIS — H6123 Impacted cerumen, bilateral: Secondary | ICD-10-CM | POA: Diagnosis not present

## 2014-05-09 DIAGNOSIS — H6093 Unspecified otitis externa, bilateral: Secondary | ICD-10-CM | POA: Diagnosis not present

## 2014-05-10 DIAGNOSIS — R2681 Unsteadiness on feet: Secondary | ICD-10-CM | POA: Diagnosis not present

## 2014-05-10 DIAGNOSIS — R278 Other lack of coordination: Secondary | ICD-10-CM | POA: Diagnosis not present

## 2014-05-10 DIAGNOSIS — G603 Idiopathic progressive neuropathy: Secondary | ICD-10-CM | POA: Diagnosis not present

## 2014-05-15 ENCOUNTER — Encounter: Payer: Self-pay | Admitting: Internal Medicine

## 2014-05-16 ENCOUNTER — Telehealth: Payer: Self-pay | Admitting: Internal Medicine

## 2014-05-16 MED ORDER — HYDROCORTISONE ACETATE 25 MG RE SUPP: 25.0000 mg | RECTAL | Status: DC | PRN

## 2014-05-16 NOTE — Telephone Encounter (Signed)
Phone is busy.  I will continue to try and reach her

## 2014-05-16 NOTE — Telephone Encounter (Signed)
Patient has had several episodes of heavy rectal bleeding over the last several days.  She reports a very small amount of bleeding this am only on the tissue only.  "looks like it has stooped".  She denies constipation.  Patient is advised to continue BID anusol until Wed then return to PRN.  She will call back if there is a return of significant bleeding.  She verbalized understanding

## 2014-05-16 NOTE — Telephone Encounter (Signed)
agree

## 2014-05-23 ENCOUNTER — Other Ambulatory Visit: Payer: Self-pay | Admitting: Internal Medicine

## 2014-05-23 DIAGNOSIS — R339 Retention of urine, unspecified: Secondary | ICD-10-CM | POA: Diagnosis not present

## 2014-05-23 DIAGNOSIS — D414 Neoplasm of uncertain behavior of bladder: Secondary | ICD-10-CM | POA: Diagnosis not present

## 2014-05-23 NOTE — Telephone Encounter (Signed)
[facility]  

## 2014-05-24 ENCOUNTER — Telehealth: Payer: Self-pay | Admitting: Internal Medicine

## 2014-05-24 NOTE — Telephone Encounter (Signed)
Patient needs an exam prior to a catheter insertion.  She is scheduled for 05/30/14 with Nicoletta Ba PA

## 2014-05-26 DIAGNOSIS — R1030 Lower abdominal pain, unspecified: Secondary | ICD-10-CM | POA: Diagnosis not present

## 2014-05-26 DIAGNOSIS — R350 Frequency of micturition: Secondary | ICD-10-CM | POA: Diagnosis not present

## 2014-05-30 ENCOUNTER — Encounter: Payer: Self-pay | Admitting: Physician Assistant

## 2014-05-30 ENCOUNTER — Other Ambulatory Visit (INDEPENDENT_AMBULATORY_CARE_PROVIDER_SITE_OTHER): Payer: Medicare Other

## 2014-05-30 ENCOUNTER — Ambulatory Visit (INDEPENDENT_AMBULATORY_CARE_PROVIDER_SITE_OTHER): Payer: Medicare Other | Admitting: Physician Assistant

## 2014-05-30 VITALS — BP 124/70 | HR 68 | Ht <= 58 in | Wt 101.0 lb

## 2014-05-30 DIAGNOSIS — N8184 Pelvic muscle wasting: Secondary | ICD-10-CM | POA: Diagnosis not present

## 2014-05-30 DIAGNOSIS — K625 Hemorrhage of anus and rectum: Secondary | ICD-10-CM

## 2014-05-30 DIAGNOSIS — M6289 Other specified disorders of muscle: Secondary | ICD-10-CM

## 2014-05-30 LAB — CBC WITH DIFFERENTIAL/PLATELET
BASOS ABS: 0 10*3/uL (ref 0.0–0.1)
Basophils Relative: 0.2 % (ref 0.0–3.0)
EOS PCT: 1.3 % (ref 0.0–5.0)
Eosinophils Absolute: 0.1 10*3/uL (ref 0.0–0.7)
HEMATOCRIT: 38.2 % (ref 36.0–46.0)
HEMOGLOBIN: 12.5 g/dL (ref 12.0–15.0)
LYMPHS ABS: 1.2 10*3/uL (ref 0.7–4.0)
LYMPHS PCT: 19.5 % (ref 12.0–46.0)
MCHC: 32.7 g/dL (ref 30.0–36.0)
MCV: 93.6 fl (ref 78.0–100.0)
MONOS PCT: 6 % (ref 3.0–12.0)
Monocytes Absolute: 0.4 10*3/uL (ref 0.1–1.0)
Neutro Abs: 4.3 10*3/uL (ref 1.4–7.7)
Neutrophils Relative %: 73 % (ref 43.0–77.0)
Platelets: 187 10*3/uL (ref 150.0–400.0)
RBC: 4.08 Mil/uL (ref 3.87–5.11)
RDW: 13.6 % (ref 11.5–15.5)
WBC: 5.9 10*3/uL (ref 4.0–10.5)

## 2014-05-30 MED ORDER — HYDROCORTISONE ACETATE 25 MG RE SUPP: 25.0000 mg | RECTAL | Status: DC | PRN

## 2014-05-30 NOTE — Patient Instructions (Addendum)
Please go to the basement level to have your labs drawn.   Get Balmex cream at the pharmacy or [facility] for rectal irritation. Use it 2-3 times daily. Take Gas-X before meals. See Dr. Carlean Purl for an appointment as needed.

## 2014-05-30 NOTE — Progress Notes (Signed)
Patient ID: [name] Small, female   DOB: 1917-03-15, 80 y.o.   MRN: [id number]   Subjective:    Patient ID: [name] Small, female    DOB: October 09, 1916, 80 y.o.   MRN: [id number]  HPI [name] Small is a pleasant 80 year old white female known to Dr. Carlean Purl. She has a remote history of colon cancer for which she is status post right hemicolectomy. She has known internal hemorrhoids and diverticulosis. Last colonoscopy was done in 2006 per Dr. Rachelle Hora showing a segmental colectomy decreased anal tone and 1 tiny polyp was removed also noted left colon diverticulosis. She has been seen intermittently for hematochezia felt secondary to internal hemorrhoids. She comes in today for advice regarding management of her bowel and bladder problems. She has been seeing Dr. Gaynelle Arabian and is having significant amount of difficulty with urinary retention. She now has a nurse coming out twice a day for in and out catheterization which helps her feel more comfortable. She says when her bladder is not emptying and is very full she feels that is more difficult for her to have a bowel movement. Likewise if she does not have a bowel movement every day and evacuate her bowels she feels that she has more problems with her bladder. Thanksgiving with small-volume rectal bleeding which lasted for about 4 days. She has not had any bleeding since. She has no complaints of rectal pain. She also has chronic problems with atrophic vaginitis and perineal irritation. She is eating a high-fiber diet and taking Citrucel on a daily basis and says she does have a formed bowel movement almost every day.  Review of Systems Pertinent positive and negative review of systems were noted in the above HPI section.  All other review of systems was otherwise negative.  Outpatient Encounter Prescriptions as of 05/30/2014  Medication Sig  . Alpha-D-Galactosidase (BEANO PO) Take 2 tablets by mouth every morning before fiber intake and every  evening  . ALPRAZolam (XANAX) 0.25 MG tablet Take 0.25 mg by mouth at bedtime.  Marland Kitchen antiseptic oral rinse (BIOTENE) LIQD 15 mLs by Mouth Rinse route as needed for dry mouth.  . calcium carbonate (TUMS) 500 MG chewable tablet Chew 1 tablet by mouth as needed.   Marland Kitchen estradiol (ESTRACE) 0.1 MG/GM vaginal cream Place 1 Applicatorful vaginally. 1-2 times a week  . fluticasone (FLONASE) 50 MCG/ACT nasal spray ONE SPRAY IN EACH NOSTRIL ONCE DAILY DURING ALLERGY SEASON.  . hydrocortisone (ANUSOL-HC) 25 MG suppository Place 1 suppository (25 mg total) rectally as needed.  . hydrocortisone valerate ointment (WEST-CORT) 0.2 % APPLY TWICE DAILY X1 WK. THEN, APPLY VASELINE TWICE DAILY TO AREA. MAY USE THIS CREAM ONCE/WEEK  . Lactobacillus ([place] PRO-B) CAPS Take 1 by mouth every day   . levothyroxine (SYNTHROID, LEVOTHROID) 75 MCG tablet TAKE 1 TABLET DAILY BEFORE BREAKFAST FOR THYROID.  Marland Kitchen Meth-Hyo-M Bl-Na Phos-Ph Sal (URIBEL PO) Take by mouth.  . Multiple Vitamins-Minerals (ICAPS AREDS FORMULA PO) Take by mouth 2 (two) times daily.  . Multiple Vitamins-Minerals ([place]ERV[place] AREDS 2) CAPS Take by mouth.  . polyethylene glycol powder (MIRALAX) powder Take 17 g by mouth as needed.   . Probiotic Product (ALIGN) 4 MG CAPS Take 1 tablet by mouth daily.    . ranitidine (ZANTAC) 150 MG tablet Take 150 mg by mouth 2 (two) times daily.  . [DISCONTINUED] hydrocortisone (ANUSOL-HC) 25 MG suppository Place 1 suppository (25 mg total) rectally as needed.   Allergies  Allergen Reactions  . Amoxil [Amoxicillin] Diarrhea  .  Crab [Shellfish Allergy]     sensitivity  . Demerol   . Meperidine Hcl Other (See Comments)    Drop in blood pressure  . Neosporin [Neomycin-Bacitracin Zn-Polymyx]   . Infed [Iron Dextran] Rash    Became very red over face, scalp, and arms   Patient Active Problem List   Diagnosis Date Noted  . Elevated liver enzymes 04/04/2014  . Renal cancer 04/04/2014  . Colon cancer 10/12/2013  .  History of colon cancer   . Bilateral dry eyes 10/27/2012  . Dry mouth 10/27/2012  . Advanced care planning/counseling discussion 07/15/2012  . Knee pain, bilateral 10/09/2011  . HTN (hypertension)   . Carotid bruit   . PERSONAL HX COLONIC POLYPS 08/08/2010  . Female bladder prolapse, acquired 07/17/2010  . VAGINAL BLEEDING, ABNORMAL 06/28/2010  . OSTEOARTHRITIS, HIP, LEFT 06/28/2010  . Full incontinence of feces 03/23/2010  . VARICOSE VEINS, LOWER EXTREMITIES 01/06/2009  . Anemia 12/03/2007  . VAGINITIS, ATROPHIC 12/03/2007  . Idiopathic osteoporosis 12/03/2007  . Melanoma of skin, site unspecified 07/13/2007  . Hemorrhoids, internal, with bleeding and prolapse 06/12/2007  . Irritable bowel syndrome 06/12/2007  . Hypothyroidism 02/25/2007  . Hyperlipidemia 02/25/2007  . Hereditary and idiopathic peripheral neuropathy 02/25/2007  . GASTROESOPHAGEAL REFLUX DISEASE 02/25/2007  . Spinal stenosis of lumbar region 02/25/2007  . History of malignant neoplasm of large intestine 02/25/2007  . DIVERTICULOSIS, COLON, HX OF 02/25/2007   History   Social History  . Marital Status: Widowed    Spouse Name: N/A    Number of Children: 2  . Years of Education: N/A   Occupational History  . retired    Social History Main Topics  . Smoking status: Former Smoker -- 2 years    Types: Cigarettes    Quit date: 09/09/1943  . Smokeless tobacco: Never Used  . Alcohol Use: 0.0 oz/week    0 Not specified per week     Comment: occas wine 2 glasses a week  . Drug Use: No  . Sexual Activity: No   Other Topics Concern  . Not on file   Social History Narrative   widowed after a very long and happy marriage almost 22 years. 2 sons - one a Advice worker, several grandchildren. Close and attentive family. Lives alone at [facility], since 7/ 2004 : paricipates in yoga and exercise. I-ADLs including driving. Supportive family who checks on her and visits her regularly.   Advanced Care  planning documents: DNR, MOST form, Living Will   Walks with walker   Exercise: M-W-F class         [name] Small's family history includes Diabetes in her father; Heart disease in her father and sister; Stomach cancer in her maternal grandfather. There is no history of Colon cancer.      Objective:    Filed Vitals:   05/30/14 1339  BP: 124/70  Pulse: 68    Physical Exam  well-developed petite elderly white female in no acute distress, quite pleasant height 4 foot 10 weight 101. HEENT; nontraumatic normocephalic EOMI PERRLA sclera anicteric, Supple; no JVD, Cardiovascular; regular rate and rhythm with S1-S2 no murmur or gallop, Pulmonary; clear bilaterally, Abdomen ;soft nontender nondistended bowel sounds are active no palpable mass or hepatosplenomegaly bladder not palpably distended at this time, Rectal; exam she has rather diffuse perineal erythema, anal sphincter is lax no lesion palpable stool is heme-negative 70s no clubbing cyanosis or edema skin warm and dry, Psych; mood and affect appropriate  Assessment & Plan:   #91  80 year old female with pelvic floor dysfunction/laxity and now chronic problems with urinary retention. Patient is not having overt incontinence episodes and actually is doing fairly well with a high-fiber diet and regular fiber supplements. She has a very lax anal sphincter. #2 internal hemorrhoids with intermittent hematochezia #3 very remote history of colon cancer status post segmental colectomy:  #4 diverticulosis  Plan; Continue daily high fiber diet and Citrucel. Anusol HC suppositories when necessary for rectal bleeding. She is advised to use suppositories for 5-7 days sequentially 4 episodes of bleeding Check CBC today  trial of Gas-X before meals Trial of Balmex cream for perianal skin irritation She'll follow up with Dr. Carlean Purl as needed. We briefly discussed potential follow-up colonoscopy which she is not interested in and also is not  interested in pursuing banding for internal hemorrhoids.  Amy S Esterwood PA-C 05/30/2014

## 2014-05-31 ENCOUNTER — Other Ambulatory Visit: Payer: Self-pay

## 2014-06-02 NOTE — Progress Notes (Signed)
Agree with [name]'s assessment and plan. [name] Trainer E. Chapman Matteucci, MD, FACG   

## 2014-06-05 ENCOUNTER — Emergency Department (HOSPITAL_COMMUNITY)
Admission: EM | Admit: 2014-06-05 | Discharge: 2014-06-05 | Disposition: A | Discharge status: Home / Self Care | Payer: Medicare Other | Attending: Emergency Medicine | Admitting: Emergency Medicine

## 2014-06-05 DIAGNOSIS — Z79899 Other long term (current) drug therapy: Secondary | ICD-10-CM | POA: Insufficient documentation

## 2014-06-05 DIAGNOSIS — Z85038 Personal history of other malignant neoplasm of large intestine: Secondary | ICD-10-CM | POA: Diagnosis not present

## 2014-06-05 DIAGNOSIS — Z85820 Personal history of malignant melanoma of skin: Secondary | ICD-10-CM | POA: Diagnosis not present

## 2014-06-05 DIAGNOSIS — M818 Other osteoporosis without current pathological fracture: Secondary | ICD-10-CM | POA: Diagnosis not present

## 2014-06-05 DIAGNOSIS — Z7952 Long term (current) use of systemic steroids: Secondary | ICD-10-CM | POA: Diagnosis not present

## 2014-06-05 DIAGNOSIS — Z8669 Personal history of other diseases of the nervous system and sense organs: Secondary | ICD-10-CM | POA: Diagnosis not present

## 2014-06-05 DIAGNOSIS — Z7951 Long term (current) use of inhaled steroids: Secondary | ICD-10-CM | POA: Diagnosis not present

## 2014-06-05 DIAGNOSIS — E039 Hypothyroidism, unspecified: Secondary | ICD-10-CM | POA: Insufficient documentation

## 2014-06-05 DIAGNOSIS — D649 Anemia, unspecified: Secondary | ICD-10-CM | POA: Insufficient documentation

## 2014-06-05 DIAGNOSIS — R300 Dysuria: Secondary | ICD-10-CM | POA: Insufficient documentation

## 2014-06-05 DIAGNOSIS — K219 Gastro-esophageal reflux disease without esophagitis: Secondary | ICD-10-CM | POA: Diagnosis not present

## 2014-06-05 DIAGNOSIS — R319 Hematuria, unspecified: Secondary | ICD-10-CM | POA: Insufficient documentation

## 2014-06-05 DIAGNOSIS — Z86010 Personal history of colonic polyps: Secondary | ICD-10-CM | POA: Diagnosis not present

## 2014-06-05 DIAGNOSIS — I10 Essential (primary) hypertension: Secondary | ICD-10-CM | POA: Diagnosis not present

## 2014-06-05 DIAGNOSIS — Z87891 Personal history of nicotine dependence: Secondary | ICD-10-CM | POA: Insufficient documentation

## 2014-06-05 DIAGNOSIS — N368 Other specified disorders of urethra: Secondary | ICD-10-CM | POA: Insufficient documentation

## 2014-06-05 LAB — URINALYSIS, ROUTINE W REFLEX MICROSCOPIC
BILIRUBIN URINE: NEGATIVE
Glucose, UA: NEGATIVE mg/dL
Ketones, ur: NEGATIVE mg/dL
Leukocytes, UA: NEGATIVE
Nitrite: NEGATIVE
PH: 6 (ref 5.0–8.0)
Protein, ur: NEGATIVE mg/dL
Specific Gravity, Urine: 1.006 (ref 1.005–1.030)
Urobilinogen, UA: 0.2 mg/dL (ref 0.0–1.0)

## 2014-06-05 LAB — URINE MICROSCOPIC-ADD ON

## 2014-06-05 LAB — I-STAT CHEM 8, ED
BUN: 15 mg/dL (ref 6–23)
CHLORIDE: 99 meq/L (ref 96–112)
Calcium, Ion: 1.11 mmol/L — ABNORMAL LOW (ref 1.13–1.30)
Creatinine, Ser: 0.7 mg/dL (ref 0.50–1.10)
GLUCOSE: 110 mg/dL — AB (ref 70–99)
HEMATOCRIT: 37 % (ref 36.0–46.0)
Hemoglobin: 12.6 g/dL (ref 12.0–15.0)
POTASSIUM: 3.5 meq/L — AB (ref 3.7–5.3)
Sodium: 138 mEq/L (ref 137–147)
TCO2: 23 mmol/L (ref 0–100)

## 2014-06-05 MED ORDER — POTASSIUM CHLORIDE CRYS ER 20 MEQ PO TBCR
40.0000 meq | EXTENDED_RELEASE_TABLET | Freq: Once | ORAL | Status: AC
  Administered 2014-06-05: 40 meq via ORAL
  Filled 2014-06-05: qty 2

## 2014-06-05 NOTE — ED Notes (Signed)
She is happy to have her catheter and is more comfortable with her bladder emptied.  She has called [facility] for transportation with her cellphone.  She is in no distress.

## 2014-06-05 NOTE — ED Provider Notes (Signed)
CSN: [id number]     Arrival date & time 06/05/14  1516 History   First MD Initiated Contact with Patient 06/05/14 1527     No chief complaint on file.  Chief complaint difficulty urinating  (Consider location/radiation/quality/duration/timing/severity/associated sxs/prior Treatment) HPI Patient routinely gets urethra catheterized by home health nurse twice per day. She reports that today home health nurse was unable to catheterize her. She subsequently urinated, with blood in urine. She was sent here to get catheterized for urine. She is uncertain if she feels urine her bladder presently. She feels well otherwise. No treatment prior to coming here. No other associated symptoms. Past Medical History  Diagnosis Date  . 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 Osteoarthrosis, unspecified whether generalized or localized, pelvic region and thigh   . Full incontinence of feces   . Asymptomatic varicose veins   . Anemia, unspecified   . Idiopathic osteoporosis   . Melanoma of skin, site unspecified   . Irritable bowel syndrome   . Internal hemorrhoids without mention of complication     bleed easily  . Carotid bruit     hx of  . Peripheral neuropathy     hands/ feet  . HTN (hypertension)   . Esophageal reflux   . Diverticulosis   . Dyslipidemia   . Spinal stenosis, unspecified region other than cervical   . Hypothyroidism   . History of colon cancer     Adenocarcinoma of right colon, stage T2, N1.s/p resection/chemotherapy 2002  . MVP (mitral valve prolapse)   . Female bladder prolapse, acquired 07/17/2010  . Vaginal bleeding, abnormal   . Varicose veins   . Idiopathic osteoporosis   . Vaginitis, atrophic   . Fibroid   . Carotid bruit     doppler normal in the past  . Hemorrhoid     1963 surgical excision  . OA (osteoarthritis)     left hip  . Hx of colonic polyps     1965 surgical excision   Past Surgical History  Procedure Laterality Date  . Hemicolectomy  2002    right   . Colonoscopy w/ polypectomy  2006    3 mm polyp destroyed, diverticulosis  . Esophagogastroduodenoscopy  2009    mild gastritis  . Hemorrhoid surgery  1965  . Hysteroscopy    . Dilation and curettage of uterus    . Melanoma excision     Family History  Problem Relation Age of Onset  . Stomach cancer Maternal Grandfather   . Colon cancer Neg Hx   . Heart disease Father   . Diabetes Father   . Heart disease Sister    History  Substance Use Topics  . Smoking status: Former Smoker -- 2 years    Types: Cigarettes    Quit date: 09/09/1943  . Smokeless tobacco: Never Used  . Alcohol Use: 0.0 oz/week    0 Not specified per week     Comment: occas wine 2 glasses a week   OB History    Gravida Para Term Preterm AB TAB SAB Ectopic Multiple Living   2 2 2       2      Review of Systems  Genitourinary: Positive for dysuria and hematuria.      Allergies  Amoxil; Crab; Demerol; Meperidine hcl; Neosporin; and Infed  Home Medications   Prior to Admission medications   Medication Sig Start Date End Date Taking? Authorizing Provider  Alpha-D-Galactosidase (BEANO PO) Take 2 tablets by mouth every morning before fiber  intake and every evening    Historical Provider, MD  ALPRAZolam (XANAX) 0.25 MG tablet Take 0.25 mg by mouth at bedtime. 01/03/14 01/03/15  Rowe Clack, MD  antiseptic oral rinse (BIOTENE) LIQD 15 mLs by Mouth Rinse route as needed for dry mouth. 06/04/13   Neena Rhymes, MD  calcium carbonate (TUMS) 500 MG chewable tablet Chew 1 tablet by mouth as needed.     Historical Provider, MD  estradiol (ESTRACE) 0.1 MG/GM vaginal cream Place 1 Applicatorful vaginally. 1-2 times a week    Historical Provider, MD  fluticasone (FLONASE) 50 MCG/ACT nasal spray ONE SPRAY IN EACH NOSTRIL ONCE DAILY DURING ALLERGY SEASON. 05/23/14   Tiffany L Reed, DO  hydrocortisone (ANUSOL-HC) 25 MG suppository Place 1 suppository (25 mg total) rectally as needed. 05/30/14   Amy S Esterwood,  PA-C  hydrocortisone valerate ointment (WEST-CORT) 0.2 % APPLY TWICE DAILY X1 WK. THEN, APPLY VASELINE TWICE DAILY TO AREA. MAY USE THIS CREAM ONCE/WEEK 12/06/13   Terrance Mass, MD  Lactobacillus ([place] PRO-B) CAPS Take 1 by mouth every day     Historical Provider, MD  levothyroxine (SYNTHROID, LEVOTHROID) 75 MCG tablet TAKE 1 TABLET DAILY BEFORE BREAKFAST FOR THYROID. 04/04/14   Estill Dooms, MD  Meth-Hyo-M Barnett Hatter Phos-Ph Sal (URIBEL PO) Take by mouth.    Historical Provider, MD  Multiple Vitamins-Minerals (ICAPS AREDS FORMULA PO) Take by mouth 2 (two) times daily.    Historical Provider, MD  Multiple Vitamins-Minerals (PRESERVISION AREDS 2) CAPS Take by mouth.    Historical Provider, MD  polyethylene glycol powder (MIRALAX) powder Take 17 g by mouth as needed.     Historical Provider, MD  Probiotic Product (ALIGN) 4 MG CAPS Take 1 tablet by mouth daily.      Historical Provider, MD  ranitidine (ZANTAC) 150 MG tablet Take 150 mg by mouth 2 (two) times daily.    Historical Provider, MD   BP 162/74 mmHg  Pulse 82  Temp(Src) 97.9 F (36.6 C) (Oral)  Resp 20  SpO2 99% Physical Exam  Constitutional: She appears well-developed and well-nourished.  HENT:  Head: Normocephalic and atraumatic.  Eyes: Conjunctivae are normal. Pupils are equal, round, and reactive to light.  Neck: Neck supple. No tracheal deviation present. No thyromegaly present.  Cardiovascular: Normal rate and regular rhythm.   No murmur heard. Pulmonary/Chest: Effort normal and breath sounds normal.  Abdominal: Soft. Bowel sounds are normal. She exhibits no distension. There is no tenderness.  Genitourinary:  External atrophy. Urethra appears red and swollen  Musculoskeletal: Normal range of motion. She exhibits no edema or tenderness.  Neurological: She is alert. Coordination normal.  Skin: Skin is warm and dry. No rash noted.  Psychiatric: She has a normal mood and affect.  Nursing note and vitals reviewed.   ED  Course  Procedures (including critical care time) Labs Review Labs Reviewed - No data to display  Imaging Review No results found.   EKG Interpretation None     Foley catheter inserted by nurse without difficulty. She was administered potassium 40 mEq by mouth prior to discharge. Results for orders placed or performed during the hospital encounter of 06/05/14  Urinalysis, Routine w reflex microscopic  Result Value Ref Range   Color, Urine GREEN (A) YELLOW   APPearance CLEAR CLEAR   Specific Gravity, Urine 1.006 1.005 - 1.030   pH 6.0 5.0 - 8.0   Glucose, UA NEGATIVE NEGATIVE mg/dL   Hgb urine dipstick TRACE (A) NEGATIVE   Bilirubin  Urine NEGATIVE NEGATIVE   Ketones, ur NEGATIVE NEGATIVE mg/dL   Protein, ur NEGATIVE NEGATIVE mg/dL   Urobilinogen, UA 0.2 0.0 - 1.0 mg/dL   Nitrite NEGATIVE NEGATIVE   Leukocytes, UA NEGATIVE NEGATIVE  Urine microscopic-add on  Result Value Ref Range   RBC / HPF 0-2 <3 RBC/hpf  I-stat chem 8, ed  Result Value Ref Range   Sodium 138 137 - 147 mEq/L   Potassium 3.5 (L) 3.7 - 5.3 mEq/L   Chloride 99 96 - 112 mEq/L   BUN 15 6 - 23 mg/dL   Creatinine, Ser 0.70 0.50 - 1.10 mg/dL   Glucose, Bld 110 (H) 70 - 99 mg/dL   Calcium, Ion 1.11 (L) 1.13 - 1.30 mmol/L   TCO2 23 0 - 100 mmol/L   Hemoglobin 12.6 12.0 - 15.0 g/dL   HCT 37.0 36.0 - 46.0 %   No results found.  MDM  In light of home health staff having difficulty catheterizing patient she will go home with Foley catheter with leg bag I spoke with Dr.Macdiarrmid to notify him. No further treatment needed. Patient is instructed to keep her scheduled plan with Dr.Tanbnenbzaum in 2 days Diagnosis #1dysuria #2 hypokalemia Final diagnoses:  None        Orlie Dakin, MD 06/05/14 1712

## 2014-06-05 NOTE — Discharge Instructions (Signed)
Foley Catheter Care Keep your scheduled appointment with Dr. Gaynelle Arabian in 2 days A Foley catheter is a soft, flexible tube. This tube is placed into your bladder to drain pee (urine). If you go home with this catheter in place, follow the instructions below. TAKING CARE OF THE CATHETER 1. Wash your hands with soap and water. 2. Put soap and water on a clean washcloth.  Clean the skin where the tube goes into your body.  Clean away from the tube site.  Never wipe toward the tube.  Clean the area using a circular motion.  Remove all the soap. Pat the area dry with a clean towel. For males, reposition the skin that covers the end of the penis (foreskin). 3. Attach the tube to your leg with tape or a leg strap. Do not stretch the tube tight. If you are using tape, remove any stickiness left behind by past tape you used. 4. Keep the drainage bag below your hips. Keep it off the floor. 5. Check your tube during the day. Make sure it is working and draining. Make sure the tube does not curl, twist, or bend. 6. Do not pull on the tube or try to take it out. TAKING CARE OF THE DRAINAGE BAGS You will have a large overnight drainage bag and a small leg bag. You may wear the overnight bag any time. Never wear the small bag at night. Follow the directions below. Emptying the Drainage Bag Empty your drainage bag when it is  - full or at least 2-3 times a day. 1. Wash your hands with soap and water. 2. Keep the drainage bag below your hips. 3. Hold the dirty bag over the toilet or clean container. 4. Open the pour spout at the bottom of the bag. Empty the pee into the toilet or container. Do not let the pour spout touch anything. 5. Clean the pour spout with a gauze pad or cotton ball that has rubbing alcohol on it. 6. Close the pour spout. 7. Attach the bag to your leg with tape or a leg strap. 8. Wash your hands well. Changing the Drainage Bag Change your bag once a month or sooner if it starts  to smell or look dirty.  1. Wash your hands with soap and water. 2. Pinch the rubber tube so that pee does not spill out. 3. Disconnect the catheter tube from the drainage tube at the connection valve. Do not let the tubes touch anything. 4. Clean the end of the catheter tube with an alcohol wipe. Clean the end of a the drainage tube with a different alcohol wipe. 5. Connect the catheter tube to the drainage tube of the clean drainage bag. 6. Attach the new bag to the leg with tape or a leg strap. Avoid attaching the new bag too tightly. 7. Wash your hands well. Cleaning the Drainage Bag 1. Wash your hands with soap and water. 2. Wash the bag in warm, soapy water. 3. Rinse the bag with warm water. 4. Fill the bag with a mixture of white vinegar and water (1 cup vinegar to 1 quart warm water [.2 liter vinegar to 1 liter warm water]). Close the bag and soak it for 30 minutes in the solution. 5. Rinse the bag with warm water. 6. Hang the bag to dry with the pour spout open and hanging downward. 7. Store the clean bag (once it is dry) in a clean plastic bag. 8. Wash your hands well. PREVENT INFECTION  Wash your hands before and after touching your tube.  Take showers every day. Wash the skin where the tube enters your body. Do not take baths. Replace wet leg straps with dry ones, if this applies.  Do not use powders, sprays, or lotions on the genital area. Only use creams, lotions, or ointments as told by your doctor.  For females, wipe from front to back after going to the bathroom.  Drink enough fluids to keep your pee clear or pale yellow unless you are told not to have too much fluid (fluid restriction).  Do not let the drainage bag or tubing touch or lie on the floor.  Wear cotton underwear to keep the area dry. GET HELP IF:  Your pee is cloudy or smells unusually bad.  Your tube becomes clogged.  You are not draining pee into the bag or your bladder feels full.  Your tube  starts to leak. GET HELP RIGHT AWAY IF:  You have pain, puffiness (swelling), redness, or yellowish-white fluid (pus) where the tube enters the body.  You have pain in the belly (abdomen), legs, lower back, or bladder.  You have a fever.  You see blood fill the tube, or your pee is pink or red.  You feel sick to your stomach (nauseous), throw up (vomit), or have chills.  Your tube gets pulled out. MAKE SURE YOU:   Understand these instructions.  Will watch your condition.  Will get help right away if you are not doing well or get worse. Document Released: 09/28/2012 Document Revised: 10/18/2013 Document Reviewed: 09/28/2012 [facility] Patient Information 2015 [place], [place]. This information is not intended to replace advice given to you by your health care provider. Make sure you discuss any questions you have with your health care provider.

## 2014-06-05 NOTE — ED Notes (Signed)
MD at bedside. 

## 2014-06-05 NOTE — ED Notes (Signed)
She has tolerated her catheterization well; and consistently articulates understanding of the need to follow up with Dr. Gaynelle Arabian; and further states she has an appointment with him already extant.  She ambulates with her walker to our waiting area at this time to go with her transporter from [facility].

## 2014-06-05 NOTE — ED Notes (Signed)
Pt has home health nurse who in-and-out catheterizes her twice per day due to chronic urinary retention, but home nurse was unable to place catheter today so patient needs in-and-out catheterization. Patient's urinary meatus is reddened and swollen.

## 2014-06-07 DIAGNOSIS — C649 Malignant neoplasm of unspecified kidney, except renal pelvis: Secondary | ICD-10-CM | POA: Diagnosis not present

## 2014-06-07 DIAGNOSIS — N952 Postmenopausal atrophic vaginitis: Secondary | ICD-10-CM | POA: Diagnosis not present

## 2014-06-07 DIAGNOSIS — R339 Retention of urine, unspecified: Secondary | ICD-10-CM | POA: Diagnosis not present

## 2014-06-07 DIAGNOSIS — N368 Other specified disorders of urethra: Secondary | ICD-10-CM | POA: Diagnosis not present

## 2014-06-08 ENCOUNTER — Telehealth: Payer: Self-pay | Admitting: *Deleted

## 2014-06-08 NOTE — Telephone Encounter (Signed)
Pt called and left message in triage that she noticed some slight blood in vaginal area. I called pt back and unable to reach patient and unable to leave a voicemail no mailbox was set up.

## 2014-06-21 DIAGNOSIS — H6123 Impacted cerumen, bilateral: Secondary | ICD-10-CM | POA: Diagnosis not present

## 2014-06-21 DIAGNOSIS — H6093 Unspecified otitis externa, bilateral: Secondary | ICD-10-CM | POA: Diagnosis not present

## 2014-06-21 DIAGNOSIS — H903 Sensorineural hearing loss, bilateral: Secondary | ICD-10-CM | POA: Diagnosis not present

## 2014-06-27 ENCOUNTER — Other Ambulatory Visit: Payer: Self-pay | Admitting: Internal Medicine

## 2014-07-19 DIAGNOSIS — E785 Hyperlipidemia, unspecified: Secondary | ICD-10-CM | POA: Diagnosis not present

## 2014-07-19 DIAGNOSIS — E039 Hypothyroidism, unspecified: Secondary | ICD-10-CM | POA: Diagnosis not present

## 2014-07-19 DIAGNOSIS — I10 Essential (primary) hypertension: Secondary | ICD-10-CM | POA: Diagnosis not present

## 2014-07-19 LAB — BASIC METABOLIC PANEL
BUN: 21 mg/dL (ref 4–21)
Creatinine: 0.6 mg/dL (ref 0.5–1.1)
Glucose: 95 mg/dL
Potassium: 3.6 mmol/L (ref 3.4–5.3)
Sodium: 140 mmol/L (ref 137–147)

## 2014-07-19 LAB — LIPID PANEL
Cholesterol: 166 mg/dL (ref 0–200)
HDL: 56 mg/dL (ref 35–70)
LDL Cholesterol: 104 mg/dL
LDl/HDL Ratio: 1.9
Triglycerides: 132 mg/dL (ref 40–160)

## 2014-07-19 LAB — HEPATIC FUNCTION PANEL
ALT: 14 U/L (ref 7–35)
AST: 28 U/L (ref 13–35)
Alkaline Phosphatase: 79 U/L (ref 25–125)
Bilirubin, Total: 0.5 mg/dL

## 2014-07-19 LAB — TSH: TSH: 1.41 u[IU]/mL (ref 0.41–5.90)

## 2014-07-25 ENCOUNTER — Encounter: Payer: Medicare Other | Admitting: Internal Medicine

## 2014-07-26 ENCOUNTER — Encounter: Payer: Self-pay | Admitting: Internal Medicine

## 2014-07-26 ENCOUNTER — Non-Acute Institutional Stay: Payer: Medicare Other | Admitting: Internal Medicine

## 2014-07-26 VITALS — BP 132/70 | HR 84 | Wt 103.0 lb

## 2014-07-26 DIAGNOSIS — N952 Postmenopausal atrophic vaginitis: Secondary | ICD-10-CM | POA: Diagnosis not present

## 2014-07-26 DIAGNOSIS — C189 Malignant neoplasm of colon, unspecified: Secondary | ICD-10-CM | POA: Diagnosis not present

## 2014-07-26 DIAGNOSIS — E785 Hyperlipidemia, unspecified: Secondary | ICD-10-CM

## 2014-07-26 DIAGNOSIS — K649 Unspecified hemorrhoids: Secondary | ICD-10-CM

## 2014-07-26 DIAGNOSIS — R682 Dry mouth, unspecified: Secondary | ICD-10-CM | POA: Diagnosis not present

## 2014-07-26 DIAGNOSIS — I10 Essential (primary) hypertension: Secondary | ICD-10-CM | POA: Diagnosis not present

## 2014-07-26 DIAGNOSIS — N368 Other specified disorders of urethra: Secondary | ICD-10-CM | POA: Diagnosis not present

## 2014-07-26 DIAGNOSIS — H3530 Unspecified macular degeneration: Secondary | ICD-10-CM | POA: Diagnosis not present

## 2014-07-26 DIAGNOSIS — N2889 Other specified disorders of kidney and ureter: Secondary | ICD-10-CM | POA: Diagnosis not present

## 2014-07-26 NOTE — Progress Notes (Signed)
Patient ID: [name] Small, female   DOB: January 12, 1917, 81 y.o.   MRN: [id number]   Location:  [facility]  Code Status: DNR; has living will, hcpoa  Allergies  Allergen Reactions  . Amoxil [Amoxicillin] Diarrhea  . Crab [Shellfish Allergy]     sensitivity  . Demerol   . Meperidine Hcl Other (See Comments)    Drop in blood pressure  . Neosporin [Neomycin-Bacitracin Zn-Polymyx]   . Infed [Iron Dextran] Rash    Became very red over face, scalp, and arms    Chief Complaint  Patient presents with  . Medical Management of Chronic Issues    4 month visit for blood pressure, anemia, thyroid, cholesterol    HPI: Patient is a 81 y.o. white female seen in the office today for 4 month f/u.    Says she ate something with too much fiber.  Was feeling well and reading a book, relaxing.    Is waiting until next wee to find out if about kidney cancer f/u.  Had a small cancer.  Gets catheterized twice a day.  Is trying to take one day at a time.  F/u is with Dr. Patsi Sears.  Had colon cancer.  Had digestive problems all her life.    Has dry mouth and dry eyes, but not official Sjogren's.   Drinks water steadily.  No pain right now.  Sometimes has rectal pain from her hemorrhoids.  If really bad, will use suppository.  12 are $234.  Has to use at least 3 for relief.    Has no hormones--uses estradiol low dose with her finger.    Sleeps well at night.  Does take xanax 0.25mg  for a long time.  No longer takes [place].  Forgot her appt today.  Says her memory is not good, but seems to have pretty good recall during visit--provided a lot of history.  Eyes have gotten very bad.  Takes icaps for that.    Review of Systems:  Review of Systems  Constitutional: Negative for fever.  HENT: Positive for hearing loss.   Eyes: Positive for blurred vision.       Macular degeneration, wears glasses  Respiratory: Negative for shortness of breath.   Cardiovascular: Negative for chest pain  and leg swelling.  Gastrointestinal: Positive for diarrhea. Negative for abdominal pain, constipation and blood in stool.       Frequent bms especially after fatty foods  Genitourinary:       Urethral prolapse, get bid caths  Musculoskeletal: Negative for falls.       Uses walker  Neurological: Negative for dizziness.  Endo/Heme/Allergies: Bruises/bleeds easily.  Psychiatric/Behavioral: Positive for memory loss.     Past Medical History  Diagnosis Date  . 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 Osteoarthrosis, unspecified whether generalized or localized, pelvic region and thigh   . Full incontinence of feces   . Asymptomatic varicose veins   . Anemia, unspecified   . Idiopathic osteoporosis   . Melanoma of skin, site unspecified   . Irritable bowel syndrome   . Internal hemorrhoids without mention of complication     bleed easily  . Carotid bruit     hx of  . Peripheral neuropathy     hands/ feet  . HTN (hypertension)   . Esophageal reflux   . Diverticulosis   . Dyslipidemia   . Spinal stenosis, unspecified region other than cervical   . Hypothyroidism   . History of colon cancer     Adenocarcinoma  of right colon, stage T2, N1.s/p resection/chemotherapy 2002  . MVP (mitral valve prolapse)   . Female bladder prolapse, acquired 07/17/2010  . Vaginal bleeding, abnormal   . Varicose veins   . Idiopathic osteoporosis   . Vaginitis, atrophic   . Fibroid   . Carotid bruit     doppler normal in the past  . Hemorrhoid     1963 surgical excision  . OA (osteoarthritis)     left hip  . Hx of colonic polyps     1965 surgical excision    Past Surgical History  Procedure Laterality Date  . Hemicolectomy  2002    right  . Colonoscopy w/ polypectomy  2006    3 mm polyp destroyed, diverticulosis  . Esophagogastroduodenoscopy  2009    mild gastritis  . Hemorrhoid surgery  1965  . Hysteroscopy    . Dilation and curettage of uterus    . Melanoma excision      Social  History:   reports that she quit smoking about 70 years ago. Her smoking use included Cigarettes. She quit after 2 years of use. She has never used smokeless tobacco. She reports that she drinks alcohol. She reports that she does not use illicit drugs.  Family History  Problem Relation Age of Onset  . Stomach cancer Maternal Grandfather   . Colon cancer Neg Hx   . Heart disease Father   . Diabetes Father   . Heart disease Sister     Medications: Patient's Medications  New Prescriptions   No medications on file  Previous Medications   ALPHA-D-GALACTOSIDASE (BEANO PO)    Take 2 tablets by mouth every morning before fiber intake and every evening   ALPRAZOLAM (XANAX) 0.25 MG TABLET    Take 0.25 mg by mouth at bedtime.   ANTISEPTIC ORAL RINSE (BIOTENE) LIQD    15 mLs by Mouth Rinse route as needed for dry mouth.   CALCIUM CARBONATE (TUMS) 500 MG CHEWABLE TABLET    Chew 1 tablet by mouth daily as needed for indigestion (indigestion).    ESTRADIOL (ESTRACE) 0.1 MG/GM VAGINAL CREAM    Place 1 Applicatorful vaginally. 1-2 times a week   FLUTICASONE (FLONASE) 50 MCG/ACT NASAL SPRAY    ONE SPRAY IN EACH NOSTRIL ONCE DAILY DURING ALLERGY SEASON.   HYDROCORTISONE (ANUSOL-HC) 25 MG SUPPOSITORY    Place 1 suppository (25 mg total) rectally as needed.   HYDROCORTISONE VALERATE OINTMENT (WEST-CORT) 0.2 %    APPLY TWICE DAILY X1 WK. THEN, APPLY VASELINE TWICE DAILY TO AREA. MAY USE THIS CREAM ONCE/WEEK   LACTOBACILLUS (REPHRESH PRO-B) CAPS    Take 1 tablet by mouth daily.    LEVOTHYROXINE (SYNTHROID, LEVOTHROID) 75 MCG TABLET    TAKE 1 TABLET DAILY BEFORE BREAKFAST FOR THYROID.   METH-HYO-M BL-NA PHOS-PH SAL (URIBEL PO)    Take 1 capsule by mouth 2 (two) times daily.    METHYLCELLULOSE (CITRUCEL FIBERSHAKE) ORAL POWDER    Take 1 packet by mouth daily.   MULTIPLE VITAMINS-MINERALS (ICAPS AREDS FORMULA PO)    Take 1 tablet by mouth 2 (two) times daily.    POLYETHYLENE GLYCOL POWDER (MIRALAX) POWDER     Take 17 g by mouth daily as needed for mild constipation.    PROBIOTIC PRODUCT (ALIGN) 4 MG CAPS    Take 1 tablet by mouth daily.     PROCTOSOL HC 2.5 % RECTAL CREAM    APPLY RECTALLY TWICE DAILY AS NEEDED.   RANITIDINE (ZANTAC) 150 MG TABLET  Take 150 mg by mouth 2 (two) times daily.   ZOLPIDEM (AMBIEN) 5 MG TABLET    Take 5 mg by mouth at bedtime as needed for sleep.  Modified Medications   No medications on file  Discontinued Medications   No medications on file   Physical Exam: Filed Vitals:   07/26/14 1648  BP: 132/70  Pulse: 84  Weight: 103 lb (46.72 kg)  SpO2: 99%  Physical Exam  Constitutional: She is oriented to person, place, and time. She appears well-developed and well-nourished. No distress.  Thin white female  HENT:  Head: Normocephalic and atraumatic.  Cardiovascular: Normal rate, regular rhythm and intact distal pulses.   Murmur heard. Mitral murmur  Pulmonary/Chest: Effort normal and breath sounds normal. No respiratory distress.  Abdominal: Soft. Bowel sounds are normal. She exhibits distension. There is no tenderness.  Musculoskeletal: Normal range of motion.  Uses walker with skis  Neurological: She is alert and oriented to person, place, and time.  Skin: Skin is warm and dry. There is pallor.  Psychiatric:  Anxious, talks fast and a bit tangential     Labs reviewed: Basic Metabolic Panel:  Recent Labs  66/44/03 03/22/14 06/05/14 1635 07/19/14  NA 142 143 138 140  K 3.9 4.0 3.5* 3.6  CL  --   --  99  --   GLUCOSE  --   --  110*  --   BUN 17 19 15 21   CREATININE 0.6 0.7 0.70 0.6  TSH 1.07 1.90  --  1.41   Liver Function Tests:  Recent Labs  01/11/14 03/22/14 07/19/14  AST 44* 27 28  ALT 50* 13 14  ALKPHOS 174* 86 79   No results for input(s): LIPASE, AMYLASE in the last 8760 hours. No results for input(s): AMMONIA in the last 8760 hours. CBC:  Recent Labs  01/11/14 03/22/14 05/30/14 1425 06/05/14 1635  WBC 6.1 4.4 5.9  --     NEUTROABS  --   --  4.3  --   HGB 12.6 12.3 12.5 12.6  HCT 37 36 38.2 37.0  MCV  --   --  93.6  --   PLT 245 180 187.0  --    Lipid Panel:  Recent Labs  07/19/14  CHOL 166  HDL 56  LDLCALC 104  TRIG 474   Lab Results  Component Value Date   HGBA1C 5.9 12/02/2006    Past Procedures: 01/26/14:  CT abdomen/pelvis with contrast:  1. New enhancing 1.8 cm mass on the lower pole of the left kidney worrisome for renal cell carcinoma. 2. Single tiny gallstone. 3. Multiple tiny low-density areas in the right kidney, most likely cysts but there to small to characterize. 4. No evidence of recurrent colon cancer. 5. Severe spinal stenosis at L4-5.  Assessment/Plan 1. Renal mass, left Has f/u CT of kidneys coming up next week, then visit with Dr. Patsi Sears -tells me her son is actually a retired nephrologist  2. Colon cancer -historically, had "successful chemotherapy back then" -has had a lot of dietary intolerances her whole life and has difficulty if she eats too much fiber or too much fatty foods -has frequent loose bms  3. Hyperlipidemia -cont low fat diet  4. Essential hypertension -bp at goal w/o meds at this time  5. Prolapsed urethral mucosa -follows with urology -gets straight cath bid in her home  6. Hemorrhoids, unspecified hemorrhoid type -ongoing problems with this -uses proctosol cream and also suppositories if needed  7. Atrophic vaginitis -  uses estrogen cream small amt applied with her finger  8. Dry mouth -longstanding also, uses biotene, says she does not formally have sjogren's disease, does also have dry eyes  9. Macular degeneration -cont icaps, f/u with ophtho  Labs/tests ordered:  Cbc, bmp, tsh Next appt:  3 mos with labs before  Emmarae Cowdery L. Ree Alcalde, D.O. Geriatrics [facility] [address], [place] [address] Cell Phone (Mon-Fri 8am-5pm):  [phone] On Call:  [phone] & follow prompts after  5pm & weekends Office Phone:  [phone] Office Fax:  [phone]

## 2014-07-28 ENCOUNTER — Other Ambulatory Visit: Payer: Self-pay | Admitting: Internal Medicine

## 2014-08-03 DIAGNOSIS — C649 Malignant neoplasm of unspecified kidney, except renal pelvis: Secondary | ICD-10-CM | POA: Diagnosis not present

## 2014-08-08 ENCOUNTER — Other Ambulatory Visit: Payer: Self-pay | Admitting: Internal Medicine

## 2014-08-15 DIAGNOSIS — N281 Cyst of kidney, acquired: Secondary | ICD-10-CM | POA: Diagnosis not present

## 2014-08-15 DIAGNOSIS — C649 Malignant neoplasm of unspecified kidney, except renal pelvis: Secondary | ICD-10-CM | POA: Diagnosis not present

## 2014-08-15 DIAGNOSIS — C642 Malignant neoplasm of left kidney, except renal pelvis: Secondary | ICD-10-CM | POA: Diagnosis not present

## 2014-08-22 ENCOUNTER — Encounter: Payer: Medicare Other | Admitting: Internal Medicine

## 2014-08-23 DIAGNOSIS — R339 Retention of urine, unspecified: Secondary | ICD-10-CM | POA: Diagnosis not present

## 2014-08-23 DIAGNOSIS — C649 Malignant neoplasm of unspecified kidney, except renal pelvis: Secondary | ICD-10-CM | POA: Diagnosis not present

## 2014-08-31 DIAGNOSIS — H903 Sensorineural hearing loss, bilateral: Secondary | ICD-10-CM | POA: Diagnosis not present

## 2014-08-31 DIAGNOSIS — H6123 Impacted cerumen, bilateral: Secondary | ICD-10-CM | POA: Diagnosis not present

## 2014-08-31 DIAGNOSIS — H6093 Unspecified otitis externa, bilateral: Secondary | ICD-10-CM | POA: Diagnosis not present

## 2014-09-21 DIAGNOSIS — H5203 Hypermetropia, bilateral: Secondary | ICD-10-CM | POA: Diagnosis not present

## 2014-09-21 DIAGNOSIS — H25013 Cortical age-related cataract, bilateral: Secondary | ICD-10-CM | POA: Diagnosis not present

## 2014-09-21 DIAGNOSIS — H02105 Unspecified ectropion of left lower eyelid: Secondary | ICD-10-CM | POA: Diagnosis not present

## 2014-09-21 DIAGNOSIS — H02102 Unspecified ectropion of right lower eyelid: Secondary | ICD-10-CM | POA: Diagnosis not present

## 2014-09-26 ENCOUNTER — Telehealth: Payer: Self-pay | Admitting: *Deleted

## 2014-09-26 NOTE — Telephone Encounter (Signed)
Pt left message in triage c/o vaginal bleeding not heavy, I called pt back and informed her OV needed, front desk will call.

## 2014-09-27 ENCOUNTER — Encounter: Payer: Self-pay | Admitting: Gynecology

## 2014-09-27 ENCOUNTER — Ambulatory Visit (INDEPENDENT_AMBULATORY_CARE_PROVIDER_SITE_OTHER): Payer: Medicare Other | Admitting: Gynecology

## 2014-09-27 VITALS — BP 106/68

## 2014-09-27 DIAGNOSIS — L292 Pruritus vulvae: Secondary | ICD-10-CM | POA: Diagnosis not present

## 2014-09-27 DIAGNOSIS — N905 Atrophy of vulva: Secondary | ICD-10-CM | POA: Diagnosis not present

## 2014-09-27 NOTE — Patient Instructions (Signed)
Apply the Westocort cream twice a week on the vulvar area and the other days of the week use vaseline to protect area from chaffing and irritation in case of urine leakage

## 2014-09-27 NOTE — Progress Notes (Signed)
   Patient is a 81 year old who presented to the office complaining of some slight vaginal irritation and thought that she had felt a bump and wasn't sure that it irritated her and that she noted some slight blood. Patient with known vulvar atrophy/irritation as a result of incontinence at times. Review of her record as follows:  Patient in 2013 had an ultrasound which demonstrated the uterus to measure 6.1 x 4.8 x 3.4 cm with endometrial stripe of 4.2 mm. Uterus anteverted with fluid-filled endometrial cavity right calcified fibroid measured 4 mm. The fluid-filled cervix with right anterior wall defect vascular flow 4.5 mm. Right ovary atrophic continued presence of solid calcification measured 7 x 9 mm avascular. Left ovary was atrophic. Patient also undergone endometrial biopsy which was benign.  Patient multiple medical comorbidities long-standing history of uterine prolapse and cystocele with multiple comorbidities and not a surgical candidate.   Patient is no longer using the estrogen cream but stated that she uses the Elocon 0.1% cream that I had prescribed on a when necessary basis but not using the Vaseline the other days as instructed.  Exam: Bartholin urethra Skene glands with atrophic changes external genitalia not irritated as previously seen no induration no lesions noted. Uterine descensus, large cystocele Small intramural rectal hemorrhoid slightly protruding  Assessment/plan: Patient with atrophic vulvitis will recommend the Elocon 0.1% cream to apply twice a week and the other 5 days to use Vaseline to help as a barrier from leakage of urine to prevent irritation. Patient was reassured.

## 2014-10-04 ENCOUNTER — Encounter: Payer: Self-pay | Admitting: Internal Medicine

## 2014-10-04 ENCOUNTER — Non-Acute Institutional Stay: Payer: Medicare Other | Admitting: Internal Medicine

## 2014-10-04 VITALS — BP 140/68 | HR 68 | Temp 97.7°F | Resp 18 | Wt 102.0 lb

## 2014-10-04 DIAGNOSIS — L259 Unspecified contact dermatitis, unspecified cause: Secondary | ICD-10-CM | POA: Diagnosis not present

## 2014-10-04 DIAGNOSIS — L700 Acne vulgaris: Secondary | ICD-10-CM

## 2014-10-04 NOTE — Progress Notes (Signed)
Patient ID: [name] Small, female   DOB: December 10, 1916, 81 y.o.   MRN: [id number]   Location:  [facility]  Code Status: DNR   Allergies  Allergen Reactions  . Amoxil [Amoxicillin] Diarrhea  . Crab [Shellfish Allergy]     sensitivity  . Demerol   . Meperidine Hcl Other (See Comments)    Drop in blood pressure  . Neosporin [Neomycin-Bacitracin Zn-Polymyx]   . Infed [Iron Dextran] Rash    Became very red over face, scalp, and arms    Chief Complaint  Patient presents with  . pimple    above right eye  . Rash    on left arm    HPI: Patient is a 81 y.o. white female seen in the clinic today for pimple above her right eye and rash on her left arm.   Right eye infection in past treated by Dr. Charlotte Sanes with erythromycin.  She noted a small pimple first in her eyebrow and then a second one appeared beneath it a couple days later.  The larger one in the eyebrow is slightly tender and has grown in size which concerned her and that's why she requested to be seen.  She was concerned it was infected.  The left arm rash is now nearly gone.    Review of Systems:  Review of Systems  Constitutional: Negative for fever and chills.  Skin: Positive for itching and rash.       Of left arm; two small pimples over right eye     Past Medical History  Diagnosis Date  . 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 Osteoarthrosis, unspecified whether generalized or localized, pelvic region and thigh   . Full incontinence of feces   . Asymptomatic varicose veins   . Anemia, unspecified   . Idiopathic osteoporosis   . Melanoma of skin, site unspecified   . Irritable bowel syndrome   . Internal hemorrhoids without mention of complication     bleed easily  . Carotid bruit     hx of  . Peripheral neuropathy     hands/ feet  . HTN (hypertension)   . Esophageal reflux   . Diverticulosis   . Dyslipidemia   . Spinal stenosis, unspecified region other than cervical   . Hypothyroidism   .  History of colon cancer     Adenocarcinoma of right colon, stage T2, N1.s/p resection/chemotherapy 2002  . MVP (mitral valve prolapse)   . Female bladder prolapse, acquired 07/17/2010  . Vaginal bleeding, abnormal   . Varicose veins   . Idiopathic osteoporosis   . Vaginitis, atrophic   . Fibroid   . Carotid bruit     doppler normal in the past  . Hemorrhoid     1963 surgical excision  . OA (osteoarthritis)     left hip  . Hx of colonic polyps     1965 surgical excision    Past Surgical History  Procedure Laterality Date  . Hemicolectomy  2002    right  . Colonoscopy w/ polypectomy  2006    3 mm polyp destroyed, diverticulosis  . Esophagogastroduodenoscopy  2009    mild gastritis  . Hemorrhoid surgery  1965  . Hysteroscopy    . Dilation and curettage of uterus    . Melanoma excision      Social History:   reports that she quit smoking about 71 years ago. Her smoking use included Cigarettes. She quit after 2 years of use. She has never used smokeless  tobacco. She reports that she drinks alcohol. She reports that she does not use illicit drugs.  Family History  Problem Relation Age of Onset  . Stomach cancer Maternal Grandfather   . Colon cancer Neg Hx   . Heart disease Father   . Diabetes Father   . Heart disease Sister     Medications: Patient's Medications  New Prescriptions   No medications on file  Previous Medications   ALPHA-D-GALACTOSIDASE (BEANO PO)    Take 2 tablets by mouth every morning before fiber intake and every evening   ALPRAZOLAM (XANAX) 0.25 MG TABLET    TAKE 1 TABLET EVERY SIX HOURS AS NEEDED FOR ANXIETY.   ANTISEPTIC ORAL RINSE (BIOTENE) LIQD    15 mLs by Mouth Rinse route as needed for dry mouth.   CALCIUM CARBONATE (TUMS) 500 MG CHEWABLE TABLET    Chew 1 tablet by mouth daily as needed for indigestion (indigestion).    ESTRADIOL (ESTRACE) 0.1 MG/GM VAGINAL CREAM    Place 1 Applicatorful vaginally. 1-2 times a week   FLUTICASONE (FLONASE) 50  MCG/ACT NASAL SPRAY    ONE SPRAY IN EACH NOSTRIL ONCE DAILY DURING ALLERGY SEASON.   HYDROCORTISONE (ANUSOL-HC) 25 MG SUPPOSITORY    Place 1 suppository (25 mg total) rectally as needed.   HYDROCORTISONE VALERATE OINTMENT (WEST-CORT) 0.2 %    APPLY TWICE DAILY X1 WK. THEN, APPLY VASELINE TWICE DAILY TO AREA. MAY USE THIS CREAM ONCE/WEEK   LACTOBACILLUS (REPHRESH PRO-B) CAPS    Take 1 tablet by mouth daily.    LEVOTHYROXINE (SYNTHROID, LEVOTHROID) 75 MCG TABLET    TAKE 1 TABLET DAILY BEFORE BREAKFAST FOR THYROID.   METH-HYO-M BL-NA PHOS-PH SAL (URIBEL PO)    Take 1 capsule by mouth 2 (two) times daily.    METHYLCELLULOSE (CITRUCEL FIBERSHAKE) ORAL POWDER    Take 1 packet by mouth daily.   MULTIPLE VITAMINS-MINERALS (ICAPS AREDS FORMULA PO)    Take 1 tablet by mouth 2 (two) times daily.    POLYETHYLENE GLYCOL POWDER (MIRALAX) POWDER    Take 17 g by mouth daily as needed for mild constipation.    PROBIOTIC PRODUCT (ALIGN) 4 MG CAPS    Take 1 tablet by mouth daily.     PROCTOSOL HC 2.5 % RECTAL CREAM    APPLY RECTALLY TWICE DAILY AS NEEDED.   RANITIDINE (ZANTAC) 150 MG TABLET    Take 150 mg by mouth 2 (two) times daily.  Modified Medications   No medications on file  Discontinued Medications   No medications on file     Physical Exam: Filed Vitals:   10/04/14 0739  BP: 140/68  Pulse: 68  Temp: 97.7 F (36.5 C)  Resp: 18  Weight: 102 lb (46.267 kg)   Physical Exam  Constitutional: She is oriented to person, place, and time.  Thin female, ambulates without assistive device  Neurological: She is alert and oriented to person, place, and time.  Skin:  Slightly larger than pencil eraser pustule over eyebrow, inflamed, tender to touch with head present, but not ruptured; second smaller, papule beneath eyebrow, nontender, noninflamed, and appears to be resolving     Labs reviewed: Basic Metabolic Panel:  Recent Labs  21/30/86 03/22/14 06/05/14 1635 07/19/14  NA 142 143 138 140  K 3.9  4.0 3.5* 3.6  CL  --   --  99  --   GLUCOSE  --   --  110*  --   BUN 17 19 15 21   CREATININE 0.6 0.7  0.70 0.6  TSH 1.07 1.90  --  1.41   Liver Function Tests:  Recent Labs  01/11/14 03/22/14 07/19/14  AST 44* 27 28  ALT 50* 13 14  ALKPHOS 174* 86 79   No results for input(s): LIPASE, AMYLASE in the last 8760 hours. No results for input(s): AMMONIA in the last 8760 hours. CBC:  Recent Labs  01/11/14 03/22/14 05/30/14 1425 06/05/14 1635  WBC 6.1 4.4 5.9  --   NEUTROABS  --   --  4.3  --   HGB 12.6 12.3 12.5 12.6  HCT 37 36 38.2 37.0  MCV  --   --  93.6  --   PLT 245 180 187.0  --    Lipid Panel:  Recent Labs  07/19/14  CHOL 166  HDL 56  LDLCALC 104  TRIG 161   Lab Results  Component Value Date   HGBA1C 5.9 12/02/2006    Past Procedures:  Assessment/Plan 1. Acne vulgaris -advised warm compresses over eyebrow region and anticipate the pimple will rupture or simply go away on its own as the second one beneath appears to be doing  2. Contact dermatitis -left arm--resolving -etiology unclear  Labs/tests ordered:  already has before appt 5/17 Next appt:  5/17  [name] Small L. Amoria Mclees, D.O. Geriatrics [facility] [address], [place] [address] Cell Phone (Mon-Fri 8am-5pm):  [phone] On Call:  [phone] & follow prompts after 5pm & weekends Office Phone:  [phone] Office Fax:  [phone]

## 2014-10-17 ENCOUNTER — Encounter: Payer: Self-pay | Admitting: Internal Medicine

## 2014-10-17 IMAGING — CT CT ABD-PELV W/ CM
3 of 5 series · 13 of 36 positions shown, 19 images · IV contrast (OMNI 300/WATER & [ID] OMNI 300)
Comparison: CT scan dated 06/26/2006

CLINICAL DATA: Colon cancer follow-up.

EXAM:
CT ABDOMEN AND PELVIS WITH CONTRAST
TECHNIQUE: Multidetector CT imaging of the abdomen and pelvis was performed
using the standard protocol following bolus administration of
intravenous contrast.
CONTRAST:  30mL OMNIPAQUE IOHEXOL 300 MG/ML SOLN, 100mL OMNIPAQUE
IOHEXOL 300 MG/ML SOLN

[Series 3: abd/pelvis with · axial · 0.62mm/px · z∈[-299,+16]mm · 8 of 82 slices shown, 13 images]
[im 10/82  soft-tissue]
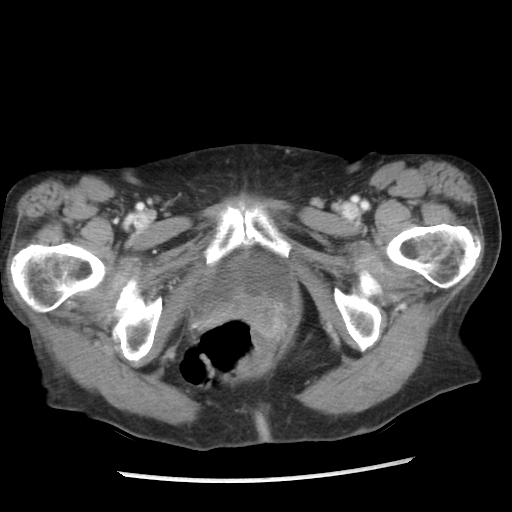
[im 10/82  bone]
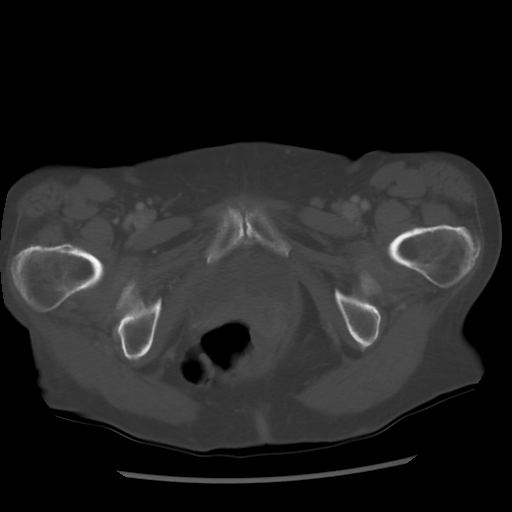
[im 19/82  soft-tissue]
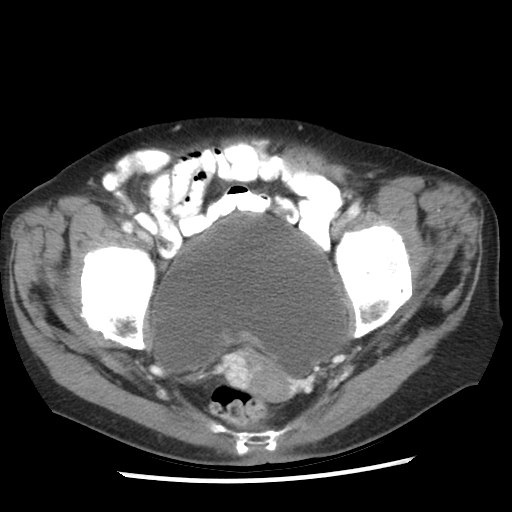
[im 28/82  soft-tissue]
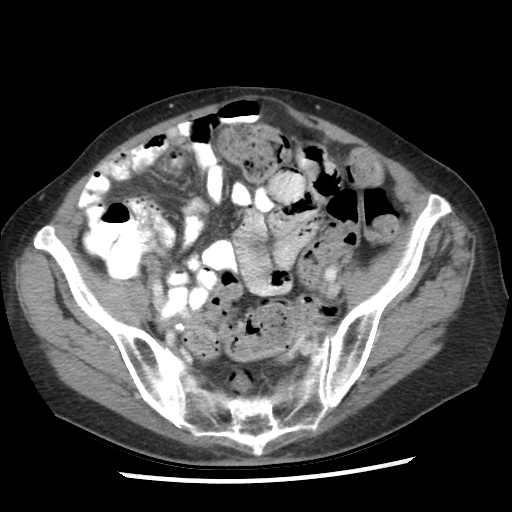
[im 37/82  soft-tissue]
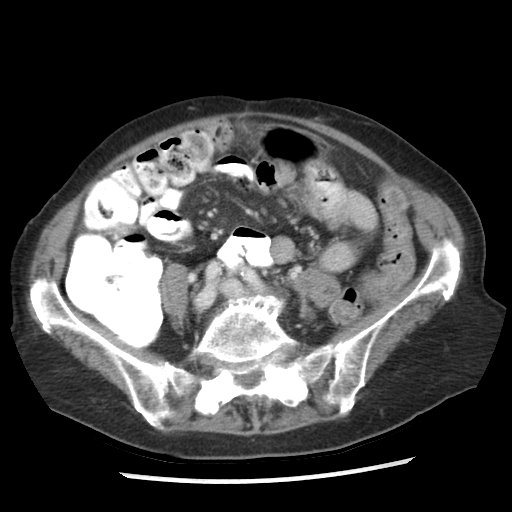
[im 46/82  soft-tissue]
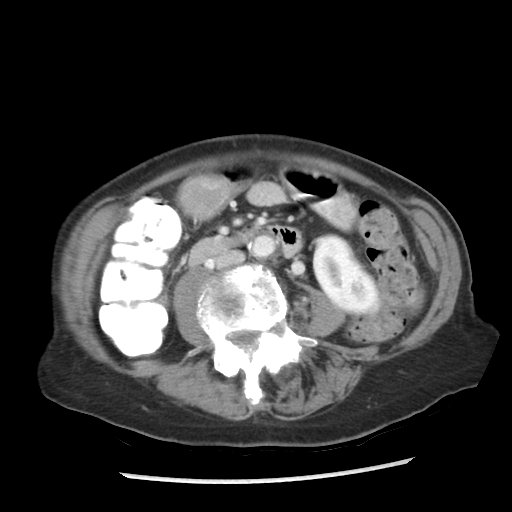
[im 46/82  lung]
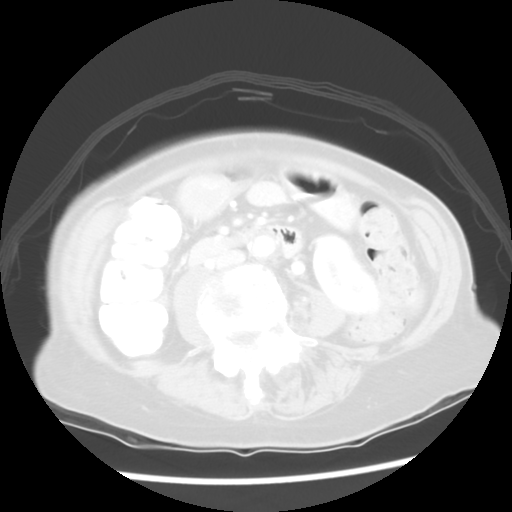
[im 55/82  soft-tissue]
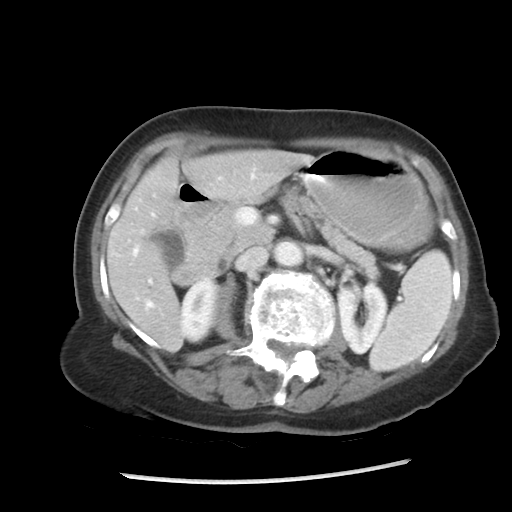
[im 55/82  lung]
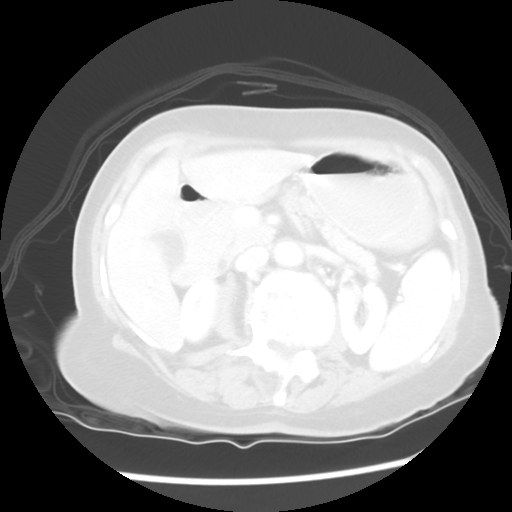
[im 64/82  soft-tissue]
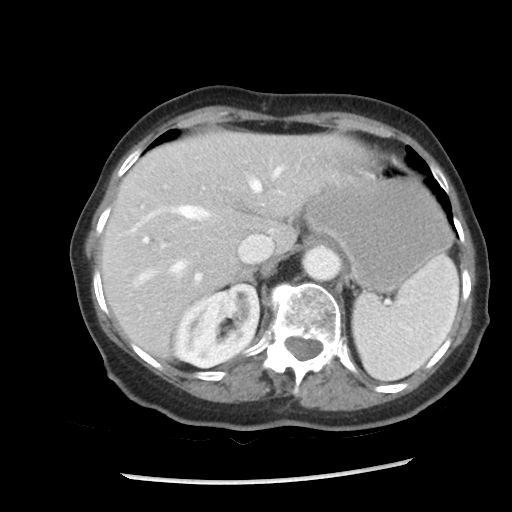
[im 64/82  lung]
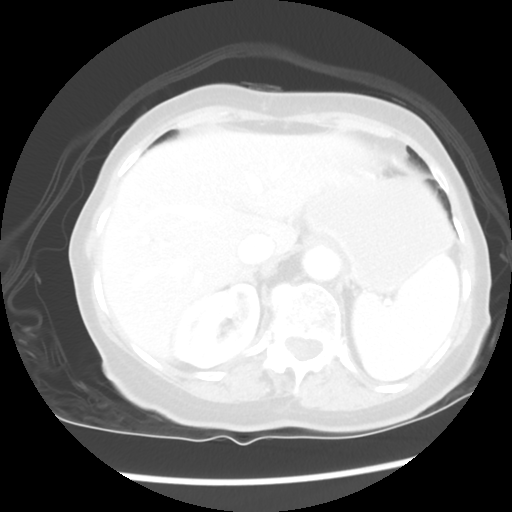
[im 73/82  soft-tissue]
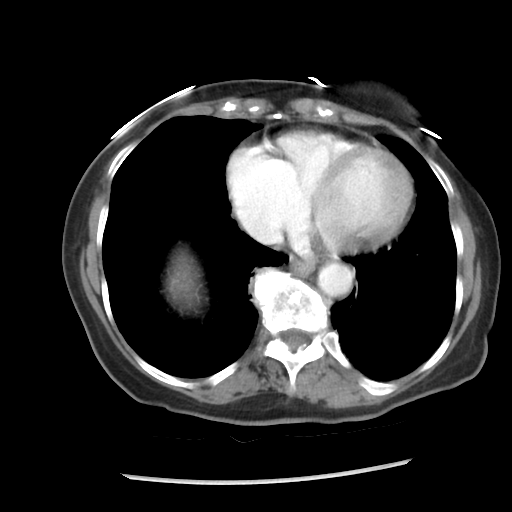
[im 73/82  lung]
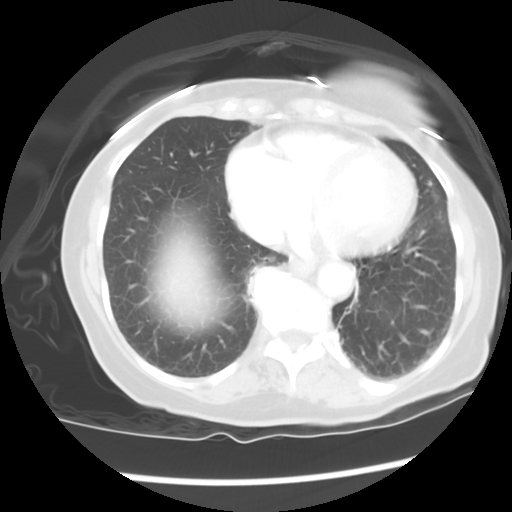

[Series 601: coronal body · coronal · 0.80mm/px · 1 of 107 slices shown, 2 images]
[im 36/107  soft-tissue]
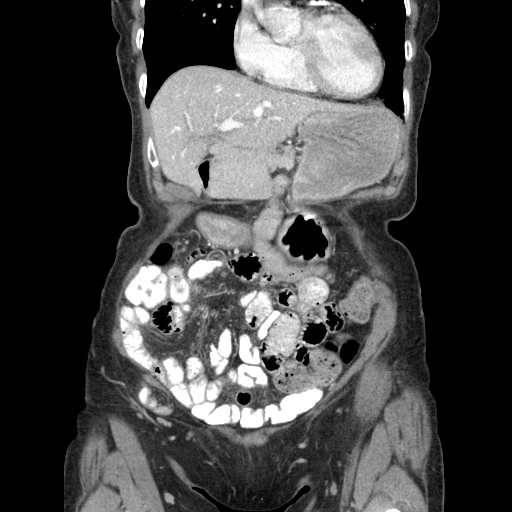
[im 36/107  bone]
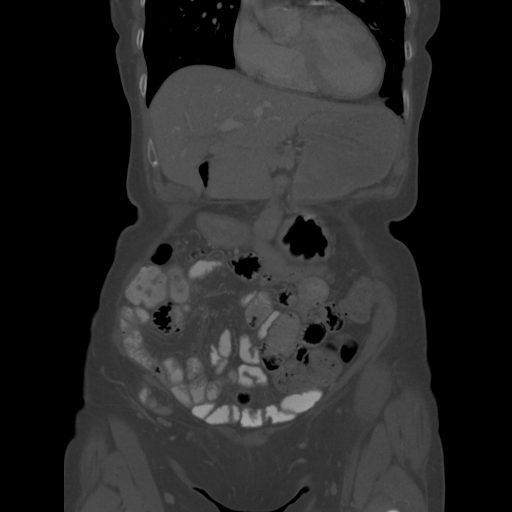

[Series 602: sagittal body · sagittal · 0.80mm/px · 4 of 129 slices shown]
[im 9/129  soft-tissue]
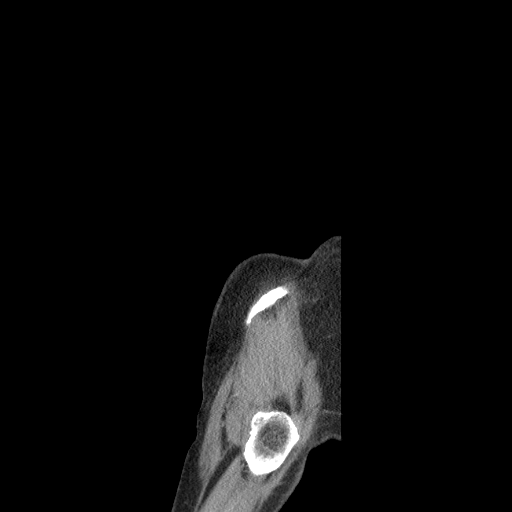
[im 26/129  soft-tissue]
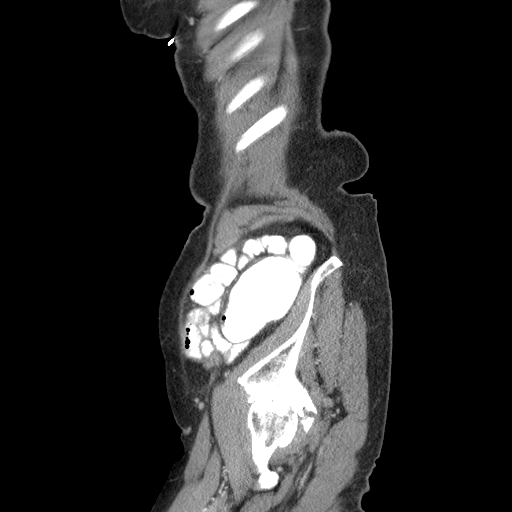
[im 43/129  soft-tissue]
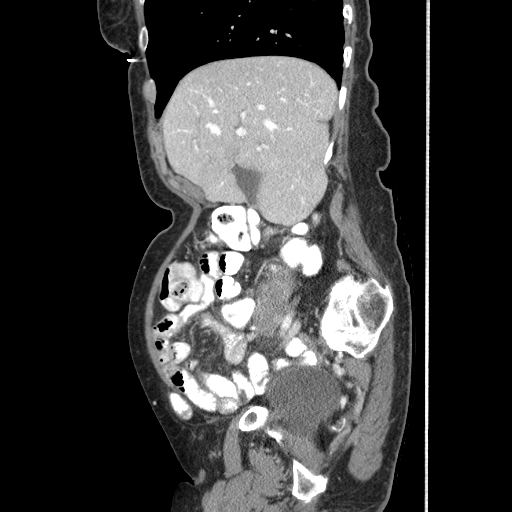
[im 60/129  soft-tissue]
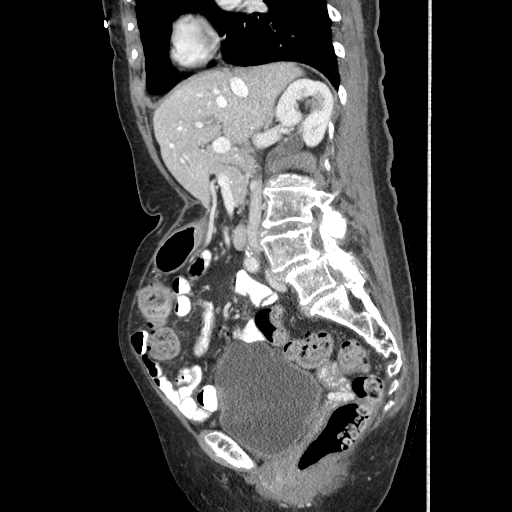

[13 of 36 positions shown; findings below may reference images not displayed]

FINDINGS: There is a new 1.8 cm inhomogeneous enhancing mass on the lateral
aspect of the lower pole of the left kidney. The patient has
multiple new small low-density low-density lesions in the right
kidney which are too small to characterize.

There is a single tiny stone in the otherwise normal appearing
gallbladder. The liver and biliary tree are normal. Spleen,
pancreas, and adrenal glands are normal. There is no adenopathy.

There is least 1 diverticulum in the distal descending colon. Uterus
and ovaries are normal. The bladder is prominent with some
redundancy of the wall but there are no masses.

The patient has moderate spinal stenosis at L2-3 and L3-4 and severe
spinal stenosis at L4-5 with almost complete obliteration of the
thecal sac. There is multilevel severe degenerative disc and joint
disease with a grade 1 spondylolisthesis at L4-5.
IMPRESSION: 1. New enhancing 1.8 cm mass on the lower pole of the left kidney
worrisome for renal cell carcinoma.
2. Single tiny gallstone.
3. Multiple tiny low-density areas in the right kidney, most likely
cysts but there to small to characterize.
4. No evidence of recurrent colon cancer.
5. Severe spinal stenosis at L4-5.

## 2014-10-19 DIAGNOSIS — N390 Urinary tract infection, site not specified: Secondary | ICD-10-CM | POA: Diagnosis not present

## 2014-10-25 DIAGNOSIS — I10 Essential (primary) hypertension: Secondary | ICD-10-CM | POA: Diagnosis not present

## 2014-10-25 DIAGNOSIS — D649 Anemia, unspecified: Secondary | ICD-10-CM | POA: Diagnosis not present

## 2014-10-25 LAB — BASIC METABOLIC PANEL
BUN: 13 mg/dL (ref 4–21)
Creatinine: 0.6 mg/dL (ref 0.5–1.1)
Glucose: 86 mg/dL
Potassium: 3.7 mmol/L (ref 3.4–5.3)
SODIUM: 140 mmol/L (ref 137–147)

## 2014-10-25 LAB — CBC AND DIFFERENTIAL
HCT: 36 % (ref 36–46)
HEMOGLOBIN: 12.1 g/dL (ref 12.0–16.0)
Platelets: 198 10*3/uL (ref 150–399)
WBC: 4.5 10*3/mL

## 2014-11-01 ENCOUNTER — Non-Acute Institutional Stay: Payer: Medicare Other | Admitting: Internal Medicine

## 2014-11-01 ENCOUNTER — Encounter: Payer: Self-pay | Admitting: Internal Medicine

## 2014-11-01 VITALS — BP 104/68 | HR 68 | Wt 102.0 lb

## 2014-11-01 DIAGNOSIS — C189 Malignant neoplasm of colon, unspecified: Secondary | ICD-10-CM | POA: Diagnosis not present

## 2014-11-01 DIAGNOSIS — L700 Acne vulgaris: Secondary | ICD-10-CM | POA: Diagnosis not present

## 2014-11-01 DIAGNOSIS — R339 Retention of urine, unspecified: Secondary | ICD-10-CM | POA: Diagnosis not present

## 2014-11-01 DIAGNOSIS — N2889 Other specified disorders of kidney and ureter: Secondary | ICD-10-CM

## 2014-11-01 DIAGNOSIS — B0229 Other postherpetic nervous system involvement: Secondary | ICD-10-CM

## 2014-11-01 DIAGNOSIS — L259 Unspecified contact dermatitis, unspecified cause: Secondary | ICD-10-CM | POA: Diagnosis not present

## 2014-11-01 NOTE — Progress Notes (Signed)
Patient ID: [name] Small, female   DOB: [birth year], 81 y.o.   MRN: [id number]   Location:  [facility]  Code Status: DNR  Goals of Care:Advanced Directive information    Chief Complaint  Patient presents with  . Medical Management of Chronic Issues    blood pressure, anemia, thyroid  . Arm Pain    upper left arm still hurts    HPI: Patient is a 81 y.o. white female seen in the [facility] today for med mgt of chronic diseases.  She is doing quite well.  Still has pain in her upper left arm where she had a rash a few weeks back.  We reviewed her labs which cbc and bmp are normal.    Says all of her problems are related to her bowels and bladder.  Feels good except with her bathroom problems.  Says she must pay attention to her walker--enjoys walking.  Has not fallen in past 6 mos.  Started attending a writing class.  She is writing about her life and past.  She loves art also.    Tells me her son has lung cancer with a met to his brain.  He is still lecturing and working in between treatments.   Review of Systems:  Review of Systems  Constitutional: Negative for fever, chills and malaise/fatigue.  HENT: Negative for congestion.   Eyes: Negative for blurred vision.  Respiratory: Negative for shortness of breath.   Cardiovascular: Negative for chest pain and leg swelling.  Gastrointestinal: Positive for blood in stool. Negative for nausea, vomiting, abdominal pain, diarrhea, constipation and melena.  Musculoskeletal: Negative for falls.       Walks with walker, slides along; still with pain superficially in left upper arm area where rash had been  Skin: Negative for itching and rash.  Neurological: Negative for dizziness and weakness.  Psychiatric/Behavioral:       Sleeps at night with xanax, says sad thing in life is her son being ill (hpi)    Past Medical History  Diagnosis Date  . 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 Osteoarthrosis, unspecified  whether generalized or localized, pelvic region and thigh   . Full incontinence of feces   . Asymptomatic varicose veins   . Anemia, unspecified   . Idiopathic osteoporosis   . Melanoma of skin, site unspecified   . Irritable bowel syndrome   . Internal hemorrhoids without mention of complication     bleed easily  . Carotid bruit     hx of  . Peripheral neuropathy     hands/ feet  . HTN (hypertension)   . Esophageal reflux   . Diverticulosis   . Dyslipidemia   . Spinal stenosis, unspecified region other than cervical   . Hypothyroidism   . History of colon cancer     Adenocarcinoma of right colon, stage T2, N1.s/p resection/chemotherapy 2002  . MVP (mitral valve prolapse)   . Female bladder prolapse, acquired 07/17/2010  . Vaginal bleeding, abnormal   . Varicose veins   . Idiopathic osteoporosis   . Vaginitis, atrophic   . Fibroid   . Carotid bruit     doppler normal in the past  . Hemorrhoid     1963 surgical excision  . OA (osteoarthritis)     left hip  . Hx of colonic polyps     1965 surgical excision    Past Surgical History  Procedure Laterality Date  . Hemicolectomy  2002    right  .  Patient ID: [name] Small, female   DOB: [birth year], 81 y.o.   MRN: [id number]   Location:  [facility]  Code Status: DNR  Goals of Care:Advanced Directive information    Chief Complaint  Patient presents with  . Medical Management of Chronic Issues    blood pressure, anemia, thyroid  . Arm Pain    upper left arm still hurts    HPI: Patient is a 81 y.o. white female seen in the [facility] today for med mgt of chronic diseases.  She is doing quite well.  Still has pain in her upper left arm where she had a rash a few weeks back.  We reviewed her labs which cbc and bmp are normal.    Says all of her problems are related to her bowels and bladder.  Feels good except with her bathroom problems.  Says she must pay attention to her walker--enjoys walking.  Has not fallen in past 6 mos.  Started attending a writing class.  She is writing about her life and past.  She loves art also.    Tells me her son has lung cancer with a met to his brain.  He is still lecturing and working in between treatments.   Review of Systems:  Review of Systems  Constitutional: Negative for fever, chills and malaise/fatigue.  HENT: Negative for congestion.   Eyes: Negative for blurred vision.  Respiratory: Negative for shortness of breath.   Cardiovascular: Negative for chest pain and leg swelling.  Gastrointestinal: Positive for blood in stool. Negative for nausea, vomiting, abdominal pain, diarrhea, constipation and melena.  Musculoskeletal: Negative for falls.       Walks with walker, slides along; still with pain superficially in left upper arm area where rash had been  Skin: Negative for itching and rash.  Neurological: Negative for dizziness and weakness.  Psychiatric/Behavioral:       Sleeps at night with xanax, says sad thing in life is her son being ill (hpi)    Past Medical History  Diagnosis Date  . 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 Osteoarthrosis, unspecified  whether generalized or localized, pelvic region and thigh   . Full incontinence of feces   . Asymptomatic varicose veins   . Anemia, unspecified   . Idiopathic osteoporosis   . Melanoma of skin, site unspecified   . Irritable bowel syndrome   . Internal hemorrhoids without mention of complication     bleed easily  . Carotid bruit     hx of  . Peripheral neuropathy     hands/ feet  . HTN (hypertension)   . Esophageal reflux   . Diverticulosis   . Dyslipidemia   . Spinal stenosis, unspecified region other than cervical   . Hypothyroidism   . History of colon cancer     Adenocarcinoma of right colon, stage T2, N1.s/p resection/chemotherapy 2002  . MVP (mitral valve prolapse)   . Female bladder prolapse, acquired 07/17/2010  . Vaginal bleeding, abnormal   . Varicose veins   . Idiopathic osteoporosis   . Vaginitis, atrophic   . Fibroid   . Carotid bruit     doppler normal in the past  . Hemorrhoid     1963 surgical excision  . OA (osteoarthritis)     left hip  . Hx of colonic polyps     1965 surgical excision    Past Surgical History  Procedure Laterality Date  . Hemicolectomy  2002    right  .  Patient ID: [name] Small, female   DOB: [birth year], 81 y.o.   MRN: [id number]   Location:  [facility]  Code Status: DNR  Goals of Care:Advanced Directive information    Chief Complaint  Patient presents with  . Medical Management of Chronic Issues    blood pressure, anemia, thyroid  . Arm Pain    upper left arm still hurts    HPI: Patient is a 81 y.o. white female seen in the [facility] today for med mgt of chronic diseases.  She is doing quite well.  Still has pain in her upper left arm where she had a rash a few weeks back.  We reviewed her labs which cbc and bmp are normal.    Says all of her problems are related to her bowels and bladder.  Feels good except with her bathroom problems.  Says she must pay attention to her walker--enjoys walking.  Has not fallen in past 6 mos.  Started attending a writing class.  She is writing about her life and past.  She loves art also.    Tells me her son has lung cancer with a met to his brain.  He is still lecturing and working in between treatments.   Review of Systems:  Review of Systems  Constitutional: Negative for fever, chills and malaise/fatigue.  HENT: Negative for congestion.   Eyes: Negative for blurred vision.  Respiratory: Negative for shortness of breath.   Cardiovascular: Negative for chest pain and leg swelling.  Gastrointestinal: Positive for blood in stool. Negative for nausea, vomiting, abdominal pain, diarrhea, constipation and melena.  Musculoskeletal: Negative for falls.       Walks with walker, slides along; still with pain superficially in left upper arm area where rash had been  Skin: Negative for itching and rash.  Neurological: Negative for dizziness and weakness.  Psychiatric/Behavioral:       Sleeps at night with xanax, says sad thing in life is her son being ill (hpi)    Past Medical History  Diagnosis Date  . 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 Osteoarthrosis, unspecified  whether generalized or localized, pelvic region and thigh   . Full incontinence of feces   . Asymptomatic varicose veins   . Anemia, unspecified   . Idiopathic osteoporosis   . Melanoma of skin, site unspecified   . Irritable bowel syndrome   . Internal hemorrhoids without mention of complication     bleed easily  . Carotid bruit     hx of  . Peripheral neuropathy     hands/ feet  . HTN (hypertension)   . Esophageal reflux   . Diverticulosis   . Dyslipidemia   . Spinal stenosis, unspecified region other than cervical   . Hypothyroidism   . History of colon cancer     Adenocarcinoma of right colon, stage T2, N1.s/p resection/chemotherapy 2002  . MVP (mitral valve prolapse)   . Female bladder prolapse, acquired 07/17/2010  . Vaginal bleeding, abnormal   . Varicose veins   . Idiopathic osteoporosis   . Vaginitis, atrophic   . Fibroid   . Carotid bruit     doppler normal in the past  . Hemorrhoid     1963 surgical excision  . OA (osteoarthritis)     left hip  . Hx of colonic polyps     1965 surgical excision    Past Surgical History  Procedure Laterality Date  . Hemicolectomy  2002    right  .

## 2014-11-22 DIAGNOSIS — H903 Sensorineural hearing loss, bilateral: Secondary | ICD-10-CM | POA: Diagnosis not present

## 2014-11-22 DIAGNOSIS — H6093 Unspecified otitis externa, bilateral: Secondary | ICD-10-CM | POA: Diagnosis not present

## 2014-11-22 DIAGNOSIS — H6123 Impacted cerumen, bilateral: Secondary | ICD-10-CM | POA: Diagnosis not present

## 2014-12-01 ENCOUNTER — Other Ambulatory Visit: Payer: Self-pay | Admitting: Internal Medicine

## 2014-12-02 ENCOUNTER — Other Ambulatory Visit: Payer: Self-pay | Admitting: Internal Medicine

## 2014-12-27 DIAGNOSIS — M2041 Other hammer toe(s) (acquired), right foot: Secondary | ICD-10-CM | POA: Diagnosis not present

## 2014-12-27 DIAGNOSIS — L602 Onychogryphosis: Secondary | ICD-10-CM | POA: Diagnosis not present

## 2014-12-27 DIAGNOSIS — L84 Corns and callosities: Secondary | ICD-10-CM | POA: Diagnosis not present

## 2015-01-11 ENCOUNTER — Non-Acute Institutional Stay: Payer: Medicare Other | Admitting: Internal Medicine

## 2015-01-11 ENCOUNTER — Encounter: Payer: Self-pay | Admitting: Internal Medicine

## 2015-01-11 VITALS — BP 100/62 | HR 72 | Temp 97.7°F | Wt 100.0 lb

## 2015-01-11 DIAGNOSIS — C189 Malignant neoplasm of colon, unspecified: Secondary | ICD-10-CM | POA: Diagnosis not present

## 2015-01-11 DIAGNOSIS — N760 Acute vaginitis: Secondary | ICD-10-CM

## 2015-01-11 DIAGNOSIS — N762 Acute vulvitis: Secondary | ICD-10-CM

## 2015-01-11 DIAGNOSIS — R339 Retention of urine, unspecified: Secondary | ICD-10-CM | POA: Diagnosis not present

## 2015-01-11 DIAGNOSIS — K644 Residual hemorrhoidal skin tags: Secondary | ICD-10-CM | POA: Diagnosis not present

## 2015-01-11 DIAGNOSIS — K648 Other hemorrhoids: Secondary | ICD-10-CM | POA: Diagnosis not present

## 2015-01-11 NOTE — Progress Notes (Signed)
Patient ID: [name] Small, female   DOB: March 28, 1917, 81 y.o.   MRN: [id number]   Location:  [facility]  Code Status: DNR  Goals of Care:Advanced Directive information Does patient have an advance directive?: Yes, Type of Advance Directive: Healthcare Power of [place];Living will;Out of facility DNR (pink MOST or yellow form), Pre-existing out of facility DNR order (yellow form or pink MOST form): Yellow form placed in chart (order not valid for inpatient use), Does patient want to make changes to advanced directive?: No - Patient declined  Chief Complaint  Patient presents with  . Acute Visit    bowel movement size of nickel came out same time she urinated    HPI: Patient is a 81 y.o. frail white female seen in the [facility] today for acute visit due to concern about having a bm (nickel-sized piece) come out the same time as urinating.  She thinks it came out of her front instead of her rectum.  Says her urethra is not placed properly.  Gets a little leakage of urine out of the back at times when she has a bm.  She has renal cell carcinoma, fecal incontinence and h/o hemicolectomy for multiple colon polyps (colon cancer).  She has urinary retention with incomplete bladder emptying and has I/O cath at bedtime each night.  She has internal and external hemorrhoids (with intermittent hematochezia) as well as diverticulosis.    She has also been bleeding vaginally and has an appt Friday about that.  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   She is clear she does not want more surgery.    Review of Systems:  Review of Systems  Constitutional: Negative for fever.  HENT: Positive for hearing loss. Negative for congestion.   Eyes:       Glasses  Respiratory: Negative for shortness of breath.   Cardiovascular: Negative for chest pain.  Gastrointestinal: Positive for constipation and blood in stool. Negative for abdominal pain, diarrhea and melena.  Genitourinary: Positive for  urgency and frequency. Negative for dysuria and hematuria.  Musculoskeletal: Negative for falls.  Skin: Negative for rash.  Neurological: Positive for weakness. Negative for dizziness and loss of consciousness.  Psychiatric/Behavioral: Positive for depression. Negative for memory loss.    Past Medical History  Diagnosis Date  . 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 Osteoarthrosis, unspecified whether generalized or localized, pelvic region and thigh   . Full incontinence of feces   . Asymptomatic varicose veins   . Anemia, unspecified   . Idiopathic osteoporosis   . Melanoma of skin, site unspecified   . Irritable bowel syndrome   . Internal hemorrhoids without mention of complication     bleed easily  . Carotid bruit     hx of  . Peripheral neuropathy     hands/ feet  . HTN (hypertension)   . Esophageal reflux   . Diverticulosis   . Dyslipidemia   . Spinal stenosis, unspecified region other than cervical   . Hypothyroidism   . History of colon cancer     Adenocarcinoma of right colon, stage T2, N1.s/p resection/chemotherapy 2002  . MVP (mitral valve prolapse)   . Female bladder prolapse, acquired 07/17/2010  . Vaginal bleeding, abnormal   . Varicose veins   . Idiopathic osteoporosis   . Vaginitis, atrophic   . Fibroid   . Carotid bruit     doppler normal in the past  . Hemorrhoid     1963 surgical excision  . OA (osteoarthritis)  left hip  . Hx of colonic polyps     1965 surgical excision    Past Surgical History  Procedure Laterality Date  . Hemicolectomy  2002    right  . Colonoscopy w/ polypectomy  2006    3 mm polyp destroyed, diverticulosis  . Esophagogastroduodenoscopy  2009    mild gastritis  . Hemorrhoid surgery  1965  . Hysteroscopy    . Dilation and curettage of uterus    . Melanoma excision      Social History:   reports that she quit smoking about 71 years ago. Her smoking use included Cigarettes. She quit after 2 years of use. She has  never used smokeless tobacco. She reports that she drinks alcohol. She reports that she does not use illicit drugs.  Allergies  Allergen Reactions  . Amoxil [Amoxicillin] Diarrhea  . Crab [Shellfish Allergy]     sensitivity  . Demerol   . Meperidine Hcl Other (See Comments)    Drop in blood pressure  . Neosporin [Neomycin-Bacitracin Zn-Polymyx]   . Infed [Iron Dextran] Rash    Became very red over face, scalp, and arms    Medications: Patient's Medications  New Prescriptions   No medications on file  Previous Medications   ALPHA-D-GALACTOSIDASE (BEANO PO)    Take 2 tablets by mouth every morning before fiber intake and every evening   ALPRAZOLAM (XANAX) 0.25 MG TABLET    TAKE 1 TABLET EVERY SIX HOURS AS NEEDED FOR ANXIETY.   ANTISEPTIC ORAL RINSE (BIOTENE) LIQD    15 mLs by Mouth Rinse route as needed for dry mouth.   CALCIUM CARBONATE (TUMS) 500 MG CHEWABLE TABLET    Chew 1 tablet by mouth daily as needed for indigestion (indigestion).    FLUTICASONE (FLONASE) 50 MCG/ACT NASAL SPRAY    ONE SPRAY IN EACH NOSTRIL ONCE DAILY DURING ALLERGY SEASON.   HYDROCORTISONE (ANUSOL-HC) 25 MG SUPPOSITORY    Place 1 suppository (25 mg total) rectally as needed.   HYDROCORTISONE VALERATE OINTMENT (WEST-CORT) 0.2 %    APPLY TWICE DAILY X1 WK. THEN, APPLY VASELINE TWICE DAILY TO AREA. MAY USE THIS CREAM ONCE/WEEK   LACTOBACILLUS (REPHRESH PRO-B) CAPS    Take 1 tablet by mouth daily.    LEVOTHYROXINE (SYNTHROID, LEVOTHROID) 75 MCG TABLET    TAKE 1 TABLET DAILY BEFORE BREAKFAST FOR THYROID.   METH-HYO-M BL-NA PHOS-PH SAL (URIBEL PO)    Take 1 capsule by mouth 2 (two) times daily.    METHYLCELLULOSE (CITRUCEL FIBERSHAKE) ORAL POWDER    Take 1 packet by mouth daily.   MULTIPLE VITAMINS-MINERALS (ICAPS AREDS FORMULA PO)    Take 1 tablet by mouth 2 (two) times daily.    PROBIOTIC PRODUCT (ALIGN) 4 MG CAPS    Take 1 tablet by mouth daily.     PROCTOSOL HC 2.5 % RECTAL CREAM    APPLY RECTALLY TWICE DAILY AS  NEEDED.   RANITIDINE (ZANTAC) 150 MG TABLET    Take 150 mg by mouth 2 (two) times daily.  Modified Medications   No medications on file  Discontinued Medications   POLYETHYLENE GLYCOL POWDER (MIRALAX) POWDER    Take 17 g by mouth daily as needed for mild constipation.      Physical Exam: Filed Vitals:   01/11/15 0840  BP: 100/62  Pulse: 72  Temp: 97.7 F (36.5 C)  TempSrc: Oral  Weight: 100 lb (45.36 kg)   Body mass index is 20.91 kg/(m^2). Physical Exam  Constitutional: She is oriented to  person, place, and time.  Thin white female, walks with rolling walker with skis  Cardiovascular: Normal rate, regular rhythm, normal heart sounds and intact distal pulses.   Pulmonary/Chest: Effort normal and breath sounds normal. No respiratory distress.  Abdominal: Soft. Bowel sounds are normal. She exhibits distension. There is no tenderness.  Genitourinary:  Erythema and friable tissue around cervix during speculum exam and visible around urethra where prolapsed and also external hemorrhoids are friable, tender with a small amt of thick blood on them  Neurological: She is alert and oriented to person, place, and time.  Skin: Skin is warm and dry.     Labs reviewed: Basic Metabolic Panel:  Recent Labs  16/10/96 06/05/14 1635 07/19/14 10/25/14  NA 143 138 140 140  K 4.0 3.5* 3.6 3.7  CL  --  99  --   --   GLUCOSE  --  110*  --   --   BUN 19 15 21 13   CREATININE 0.7 0.70 0.6 0.6  TSH 1.90  --  1.41  --    Liver Function Tests:  Recent Labs  03/22/14 07/19/14  AST 27 28  ALT 13 14  ALKPHOS 86 79   No results for input(s): LIPASE, AMYLASE in the last 8760 hours. No results for input(s): AMMONIA in the last 8760 hours. CBC:  Recent Labs  03/22/14 05/30/14 1425 06/05/14 1635 10/25/14  WBC 4.4 5.9  --  4.5  NEUTROABS  --  4.3  --   --   HGB 12.3 12.5 12.6 12.1  HCT 36 38.2 37.0 36  MCV  --  93.6  --   --   PLT 180 187.0  --  198   Lipid Panel:  Recent Labs   07/19/14  CHOL 166  HDL 56  LDLCALC 104  TRIG 045   Lab Results  Component Value Date   HGBA1C 5.9 12/02/2006   Patient Care Team: Kermit Balo, DO as PCP - General (Geriatric Medicine) Ok Edwards, MD as Consulting Physician (Gynecology) Mckinley Jewel, MD as Consulting Physician (Ophthalmology) Jethro Bolus, MD as Consulting Physician (Urology) Iva Boop, MD as Consulting Physician (Gastroenterology) Benjiman Core, MD as Consulting Physician (Oncology)  Assessment/Plan 1. Internal and external bleeding hemorrhoids -has excoriation, friability of hemorrhoids internally and externally with small amt of bleeding -cont anusol cream and suppositories for these -no fistula can be palpated between vagina and rectum, but advised to have more thorough examination with gyn about this  2. Atrophic vulvitis -has friability of vulva and cervix on exam -did not do well using estrogen creams historically -keep f/u with Dr. Lily Peer for further investigation to r/o fistula, but I could not detect one and suspect her stool just came out due to her fecal incontinence when she was urinating, but that it came out of the proper orifice  3. Urinary retention with incomplete bladder emptying -has straight cath performed nightly which yields at least 500cc per pt, cont this  4. Colon cancer -s/p hemicolectomy many years ago -refuses any colonoscopy or invasive investigations, does not want any surgery  Labs/tests ordered: no new Next appt:  Keep appt for med mgt in Nov; keep appt with Dr. Lily Peer for Friday  Jaquaveon Bilal L. Fredis Malkiewicz, D.O. Geriatrics [facility] [address] [place], [place] [address] Cell Phone (Mon-Fri 8am-5pm):  [phone] On Call:  [phone] & follow prompts after 5pm & weekends Office Phone:  [phone] Office Fax:  [phone]

## 2015-01-13 ENCOUNTER — Ambulatory Visit (INDEPENDENT_AMBULATORY_CARE_PROVIDER_SITE_OTHER): Payer: Medicare Other | Admitting: Gynecology

## 2015-01-13 ENCOUNTER — Encounter: Payer: Self-pay | Admitting: Gynecology

## 2015-01-13 VITALS — BP 118/70

## 2015-01-13 DIAGNOSIS — N952 Postmenopausal atrophic vaginitis: Secondary | ICD-10-CM

## 2015-01-13 DIAGNOSIS — N950 Postmenopausal bleeding: Secondary | ICD-10-CM

## 2015-01-13 DIAGNOSIS — Z79890 Hormone replacement therapy (postmenopausal): Secondary | ICD-10-CM | POA: Diagnosis not present

## 2015-01-13 MED ORDER — NONFORMULARY OR COMPOUNDED ITEM
Status: DC
Start: 2015-01-13 — End: 2015-01-13

## 2015-01-13 MED ORDER — NONFORMULARY OR COMPOUNDED ITEM
Status: DC
Start: 2015-01-13 — End: 2015-02-22

## 2015-01-13 NOTE — Patient Instructions (Signed)
Hormone Therapy At menopause, your body begins making less estrogen and progesterone hormones. This causes the body to stop having menstrual periods. This is because estrogen and progesterone hormones control your periods and menstrual cycle. A lack of estrogen may cause symptoms such as:  Hot flushes (or hot flashes).  Vaginal dryness.  Dry skin.  Loss of sex drive.  Risk of bone loss (osteoporosis). When this happens, you may choose to take hormone therapy to get back the estrogen lost during menopause. When the hormone estrogen is given alone, it is usually referred to as ET (Estrogen Therapy). When the hormone progestin is combined with estrogen, it is generally called HT (Hormone Therapy). This was formerly known as hormone replacement therapy (HRT). Your caregiver can help you make a decision on what will be best for you. The decision to use HT seems to change often as new studies are done. Many studies do not agree on the benefits of hormone replacement therapy. LIKELY BENEFITS OF HT INCLUDE PROTECTION FROM:  Hot Flushes (also called hot flashes) - A hot flush is a sudden feeling of heat that spreads over the face and body. The skin may redden like a blush. It is connected with sweats and sleep disturbance. Women going through menopause may have hot flushes a few times a month or several times per day depending on the woman.  Osteoporosis (bone loss)- Estrogen helps guard against bone loss. After menopause, a woman's bones slowly lose calcium and become weak and brittle. As a result, bones are more likely to break. The hip, wrist, and spine are affected most often. Hormone therapy can help slow bone loss after menopause. Weight bearing exercise and taking calcium with vitamin D also can help prevent bone loss. There are also medications that your caregiver can prescribe that can help prevent osteoporosis.  Vaginal Dryness - Loss of estrogen causes changes in the vagina. Its lining may  become thin and dry. These changes can cause pain and bleeding during sexual intercourse. Dryness can also lead to infections. This can cause burning and itching. (Vaginal estrogen treatment can help relieve pain, itching, and dryness.)  Urinary Tract Infections are more common after menopause because of lack of estrogen. Some women also develop urinary incontinence because of low estrogen levels in the vagina and bladder.  Possible other benefits of estrogen include a positive effect on mood and short-term memory in women. RISKS AND COMPLICATIONS  Using estrogen alone without progesterone causes the lining of the uterus to grow. This increases the risk of lining of the uterus (endometrial) cancer. Your caregiver should give another hormone called progestin if you have a uterus.  Women who take combined (estrogen and progestin) HT appear to have an increased risk of breast cancer. The risk appears to be small, but increases throughout the time that HT is taken.  Combined therapy also makes the breast tissue slightly denser which makes it harder to read mammograms (breast X-rays).  Combined, estrogen and progesterone therapy can be taken together every day, in which case there may be spotting of blood. HT therapy can be taken cyclically in which case you will have menstrual periods. Cyclically means HT is taken for a set amount of days, then not taken, then this process is repeated.  HT may increase the risk of stroke, heart attack, breast cancer and forming blood clots in your leg.  Transdermal estrogen (estrogen that is absorbed through the skin with a patch or a cream) may have more positive results with:    Cholesterol.  Blood pressure.  Blood clots. Having the following conditions may indicate you should not have HT:  Endometrial cancer.  Liver disease.  Breast cancer.  Heart disease.  History of blood clots.  Stroke. TREATMENT   If you choose to take HT and have a uterus,  usually estrogen and progestin are prescribed.  Your caregiver will help you decide the best way to take the medications.  Possible ways to take estrogen include:  Pills.  Patches.  Gels.  Sprays.  Vaginal estrogen cream, rings and tablets.  It is best to take the lowest dose possible that will help your symptoms and take them for the shortest period of time that you can.  Hormone therapy can help relieve some of the problems (symptoms) that affect women at menopause. Before making a decision about HT, talk to your caregiver about what is best for you. Be well informed and comfortable with your decisions. HOME CARE INSTRUCTIONS   Follow your caregivers advice when taking the medications.  A Pap test is done to screen for cervical cancer.  The first Pap test should be done at age 21.  Between ages 21 and 29, Pap tests are repeated every 2 years.  Beginning at age 30, you are advised to have a Pap test every 3 years as long as your past 3 Pap tests have been normal.  Some women have medical problems that increase the chance of getting cervical cancer. Talk to your caregiver about these problems. It is especially important to talk to your caregiver if a new problem develops soon after your last Pap test. In these cases, your caregiver may recommend more frequent screening and Pap tests.  The above recommendations are the same for women who have or have not gotten the vaccine for HPV (Human Papillomavirus).  If you had a hysterectomy for a problem that was not a cancer or a condition that could lead to cancer, then you no longer need Pap tests. However, even if you no longer need a Pap test, a regular exam is a good idea to make sure no other problems are starting.   If you are between ages 65 and 70, and you have had normal Pap tests going back 10 years, you no longer need Pap tests. However, even if you no longer need a Pap test, a regular exam is a good idea to make sure no  other problems are starting.   If you have had past treatment for cervical cancer or a condition that could lead to cancer, you need Pap tests and screening for cancer for at least 20 years after your treatment.  If Pap tests have been discontinued, risk factors (such as a new sexual partner) need to be re-assessed to determine if screening should be resumed.  Some women may need screenings more often if they are at high risk for cervical cancer.  Get mammograms done as per the advice of your caregiver. SEEK IMMEDIATE MEDICAL CARE IF:  You develop abnormal vaginal bleeding.  You have pain or swelling in your legs, shortness of breath, or chest pain.  You develop dizziness or headaches.  You have lumps or changes in your breasts or armpits.  You have slurred speech.  You develop weakness or numbness of your arms or legs.  You have pain, burning, or bleeding when urinating.  You develop abdominal pain. Document Released: 03/02/2003 Document Revised: 08/26/2011 Document Reviewed: 06/20/2010 [employer] Patient Information 2015 [employer]. This information is not intended to   replace advice given to you by your health care provider. Make sure you discuss any questions you have with your health care provider. Transvaginal Ultrasound Transvaginal ultrasound is a pelvic ultrasound, using a metal probe that is placed in the vagina, to look at a women's female organs. Transvaginal ultrasound is a method of seeing inside the pelvis of a woman. The ultrasound machine sends out sound waves from the transducer (probe). These sound waves bounce off body structures (like an echo) to create a picture. The picture shows up on a monitor. It is called transvaginal because the probe is inserted into the vagina. There should be very little discomfort from the vaginal probe. This test can also be used during pregnancy. Endovaginal ultrasound is another name for a transvaginal ultrasound. In a  transabdominal ultrasound, the probe is placed on the outside of the belly. This method gives pictures that are lower quality than pictures from the transvaginal technique. Transvaginal ultrasound is used to look for problems of the female genital tract. Some such problems include:  Infertility problems.  Congenital (birth defect) malformations of the uterus and ovaries.  Tumors in the uterus.  Abnormal bleeding.  Ovarian tumors and cysts.  Abscess (inflamed tissue around pus) in the pelvis.  Unexplained abdominal or pelvic pain.  Pelvic infection. DURING PREGNANCY, TRANSVAGINAL ULTRASOUND MAY BE USED TO LOOK AT:  Normal pregnancy.  Ectopic pregnancy (pregnancy outside the uterus).  Fetal heartbeat.  Abnormalities in the pelvis, that are not seen well with transabdominal ultrasound.  Suspected twins or multiples.  Impending miscarriage.  Problems with the cervix (incompetent cervix, not able to stay closed and hold the baby).  When doing an amniocentesis (removing fluid from the pregnancy sac, for testing).  Looking for abnormalities of the baby.  Checking the growth, development, and age of the fetus.  Measuring the amount of fluid in the amniotic sac.  When doing an external version of the baby (moving baby into correct position).  Evaluating the baby for problems in high risk pregnancies (biophysical profile).  Suspected fetal demise (death). Sometimes a special ultrasound method called Saline Infusion Sonography (SIS) is used for a more accurate look at the uterus. Sterile saline (salt water) is injected into the uterus of non-pregnant patients to see the inside of the uterus better. SIS is not used on pregnant women. The vaginal probe can also assist in obtaining biopsies of abnormal areas, in draining fluid from cysts on the ovary, and in finding IUDs (intrauterine device, birth control) that cannot be located. PREPARATION FOR TEST A transvaginal ultrasound is  done with the bladder empty. The transabdominal ultrasound is done with your bladder full. You may be asked to drink several glasses of water before that exam. Sometimes, a transabdominal ultrasound is done just after a transvaginal ultrasound, to look at organs in your abdomen. PROCEDURE  You will lie down on a table, with your knees bent and your feet in foot holders. The probe is covered with a condom. A sterile lubricant is put into the vagina and on the probe. The lubricant helps transmit the sound waves and avoid irritating the vagina. Your caregiver will move the probe inside the vaginal cavity to scan the pelvic structures. A normal test will show a normal pelvis and normal contents. An abnormal test will show abnormalities of the pelvis, placenta, or baby. ABNORMAL RESULTS MAY BE DUE TO:  Growths or tumors in the:  Uterus.  Ovaries.  Vagina.  Other pelvic structures.  Non-cancerous growths of the  uterus and ovaries.  Twisting of the ovary, cutting off blood supply to the ovary (ovarian torsion).  Areas of infection, including:  Pelvic inflammatory disease.  Abscess in the pelvis.  Locating an IUD. PROBLEMS FOUND IN PREGNANT WOMEN MAY INCLUDE:  Ectopic pregnancy (pregnancy outside the uterus).  Multiple pregnancies.  Early dilation (opening) of the cervix. This may indicate an incompetent cervix and early delivery.  Impending miscarriage.  Fetal death.  Problems with the placenta, including:  Placenta has grown over the opening of the womb (placenta previa).  Placenta has separated early in the womb (placental abruption).  Placenta grows into the muscle of the uterus (placenta accreta).  Tumors of pregnancy, including gestational trophoblastic disease. This is an abnormal pregnancy, with no fetus. The uterus is filled with many grape-like cysts that could sometimes be cancerous.  Incorrect position of the fetus (breech, vertex).  Intrauterine fetal growth  retardation (IUGR) (poor growth in the womb).  Fetal abnormalities or infection. RISKS AND COMPLICATIONS There are no known risks to the ultrasound procedure. There is no X-ray used when doing an ultrasound. Document Released: 05/15/2004 Document Revised: 08/26/2011 Document Reviewed: 05/03/2009 [facility] Patient Information 2015 [place], [place]. This information is not intended to replace advice given to you by your health care provider. Make sure you discuss any questions you have with your health care provider.

## 2015-01-13 NOTE — Progress Notes (Signed)
   Patient is a 81 year old who presented to the office today stating that she had noted some blood in her undergarment several times.Patient with known vulvar atrophy/irritation as a result of incontinence at times. Review of her record as follows:  Patient in 2013 had an ultrasound which demonstrated the uterus to measure 6.1 x 4.8 x 3.4 cm with endometrial stripe of 4.2 mm. Uterus anteverted with fluid-filled endometrial cavity right calcified fibroid measured 4 mm. The fluid-filled cervix with right anterior wall defect vascular flow 4.5 mm. Right ovary atrophic continued presence of solid calcification measured 7 x 9 mm avascular. Left ovary was atrophic. Patient also undergone endometrial biopsy which was benign.  Patient multiple medical comorbidities long-standing history of uterine prolapse and cystocele with multiple comorbidities and not a surgical candidate.   Patient had stopped using her vaginal estrogen cream. She saw her PCP recently and is documented that she does have hemorrhoids and this could potentially be the source of the bleeding that she noted. But she is here for exam as well as she does suffer from urinary incontinence.  Exam: Exam: Urethral caruncle Vagina atrophic changes cervix on was not seen Uterine descensus, large cystocele Small intramural rectal hemorrhoid slightly protruding, nonthrombosed  Assessment/plan: Patient's postmenopausal bleeding may be attributed to her urethral caruncle or her external hemorrhoid. She does have vaginal atrophy have encouraged her to take her vaginal estrogen cream once a week. Since we were unable to do an endometrial biopsy because of a very stenotic cervical os which was barely seen she is going to return back in a few weeks for an ultrasound to assess her endometrial stripe. Ever stripe and ultrasound are normal we may continue to watch or she may follow up with urologist for her caruncle and/or with the general surgeon for her  hemorrhoid

## 2015-02-08 ENCOUNTER — Ambulatory Visit (INDEPENDENT_AMBULATORY_CARE_PROVIDER_SITE_OTHER): Payer: Medicare Other | Admitting: Gynecology

## 2015-02-08 ENCOUNTER — Other Ambulatory Visit: Payer: Self-pay | Admitting: Gynecology

## 2015-02-08 ENCOUNTER — Encounter: Payer: Self-pay | Admitting: Gynecology

## 2015-02-08 ENCOUNTER — Ambulatory Visit (INDEPENDENT_AMBULATORY_CARE_PROVIDER_SITE_OTHER): Payer: Medicare Other

## 2015-02-08 DIAGNOSIS — N838 Other noninflammatory disorders of ovary, fallopian tube and broad ligament: Secondary | ICD-10-CM

## 2015-02-08 DIAGNOSIS — N952 Postmenopausal atrophic vaginitis: Secondary | ICD-10-CM

## 2015-02-08 DIAGNOSIS — N839 Noninflammatory disorder of ovary, fallopian tube and broad ligament, unspecified: Secondary | ICD-10-CM

## 2015-02-08 DIAGNOSIS — N858 Other specified noninflammatory disorders of uterus: Secondary | ICD-10-CM

## 2015-02-08 DIAGNOSIS — N950 Postmenopausal bleeding: Secondary | ICD-10-CM

## 2015-02-08 DIAGNOSIS — N362 Urethral caruncle: Secondary | ICD-10-CM

## 2015-02-08 NOTE — Progress Notes (Signed)
   Patient presented to the office today for an ultrasound to assess her endometrial stripe since she at times of experience some vaginal spotting not sure of source. She was last seen the office 01/13/2015.Review of her record as follows:  Patient in 2013 had an ultrasound which demonstrated the uterus to measure 6.1 x 4.8 x 3.4 cm with endometrial stripe of 4.2 mm. Uterus anteverted with fluid-filled endometrial cavity right calcified fibroid measured 4 mm. The fluid-filled cervix with right anterior wall defect vascular flow 4.5 mm. Right ovary atrophic continued presence of solid calcification measured 7 x 9 mm avascular. Left ovary was atrophic. Patient also undergone endometrial biopsy which was benign.  Patient multiple medical comorbidities long-standing history of uterine prolapse and cystocele with multiple comorbidities and not a surgical candidate.   Patient had stopped using her vaginal estrogen cream because she feels that this is what causes her to bleed. She would not like to go on it.  On exam last visit the following was noted: Urethral caruncle Vagina atrophic changes cervix on was not seen Uterine descensus, large cystocele Small intramural rectal hemorrhoid slightly protruding, nonthrombosed   Ultrasound today: Uterus measures 6.5 x 4.9 x 2.7 cm with endometrial stripe at 2.5 mm. Some fluid was noted in the endometrial cavity. Endometrial measurement after exclusion of the fluid demonstrated a thin endometrium with a measurement 2.5 mm. A solid calcified uterus measures 6.4 mm was noted a solid right ovarian focus avascular possibly small calcification measuring 9 x 9 mm was noted unchanged for many years. Left ovary was atrophic. No masses. No fluid in the cul-de-sac. Of note her bladder was emptied prior to the ultrasound with an in and out catheterization an approximate 500 cc of clear urine was extruded.  Assessment/plan: Postmenopausal patient who wishes not to continue  on vaginal estrogen because of irritation. Occasional spotting may be due to either her urethral: Or vaginal atrophy. I'm going to recommend that she use Replens vaginal moisturizer 3-4 times a week as needed. She has a follow-up appointment with the urologist and will reassess once again the urethral caruncle.

## 2015-02-08 NOTE — Patient Instructions (Signed)
REPLENS vaginal moisturizer

## 2015-02-14 DIAGNOSIS — R300 Dysuria: Secondary | ICD-10-CM | POA: Diagnosis not present

## 2015-02-14 DIAGNOSIS — R3911 Hesitancy of micturition: Secondary | ICD-10-CM | POA: Diagnosis not present

## 2015-02-22 ENCOUNTER — Non-Acute Institutional Stay: Payer: Medicare Other | Admitting: Internal Medicine

## 2015-02-22 ENCOUNTER — Encounter: Payer: Medicare Other | Admitting: Internal Medicine

## 2015-02-22 ENCOUNTER — Encounter: Payer: Self-pay | Admitting: Internal Medicine

## 2015-02-22 VITALS — BP 118/76 | HR 72 | Temp 97.5°F | Wt 100.0 lb

## 2015-02-22 DIAGNOSIS — H6123 Impacted cerumen, bilateral: Secondary | ICD-10-CM | POA: Diagnosis not present

## 2015-02-22 DIAGNOSIS — K648 Other hemorrhoids: Secondary | ICD-10-CM

## 2015-02-22 DIAGNOSIS — K644 Residual hemorrhoidal skin tags: Secondary | ICD-10-CM

## 2015-02-22 DIAGNOSIS — C189 Malignant neoplasm of colon, unspecified: Secondary | ICD-10-CM

## 2015-02-22 DIAGNOSIS — R339 Retention of urine, unspecified: Secondary | ICD-10-CM | POA: Diagnosis not present

## 2015-02-22 DIAGNOSIS — K5909 Other constipation: Secondary | ICD-10-CM

## 2015-02-22 DIAGNOSIS — K5900 Constipation, unspecified: Secondary | ICD-10-CM

## 2015-02-22 DIAGNOSIS — H6093 Unspecified otitis externa, bilateral: Secondary | ICD-10-CM | POA: Diagnosis not present

## 2015-02-22 DIAGNOSIS — H903 Sensorineural hearing loss, bilateral: Secondary | ICD-10-CM | POA: Diagnosis not present

## 2015-02-22 NOTE — Progress Notes (Signed)
Location:  [facility]  Code Status: DNR Goals of Care: Advanced Directive information Does patient have an advance directive?: Yes, Type of Advance Directive: [place];Living will;Out of facility DNR (pink MOST or yellow form), Pre-existing out of facility DNR order (yellow form or pink MOST form): Yellow form placed in chart (order not valid for inpatient use), Does patient want to make changes to advanced directive?: No - Patient declined   Chief Complaint  Patient presents with  . Acute Visit    stomach hard for couple of weeks. Hemorrhoids still bleeding, how offen should she use the suppository?    HPI: Patient is a 81 y.o. female seen in the office today for complaint of her stomach swelling. She has a uterine mass and was seen by Dr. Toney Rakes on 8/24.   She is catheterized twice a day by a nurse for urinary retention. Last week her stomach felt like a basketball. She took MOM with some relief. She had a bowel movement yesterday. Feels full and uncomfortable after meals. No weight gain or loss. BM's are unchanged. She eats a lot of fiber, takes citrucel and fiber one and it fills her up. Stomach feels less hard after BM and after catheterization.   She has internal and external hemorhoids that bleed easily. She uses annusol cream and rectal suppository. Dr. Mariea Clonts reviewed the indications and directions for use of these with patient.  Review of Systems:  Review of Systems  Constitutional: Negative.   Respiratory: Negative.   Cardiovascular: Negative.   Gastrointestinal: Positive for constipation and blood in stool. Negative for heartburn, nausea, vomiting and abdominal pain.       Hemorhoids  Genitourinary:       Urinary retention   Psychiatric/Behavioral: Negative.     Past Medical History  Diagnosis Date  . 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 Osteoarthrosis, unspecified whether generalized or localized, pelvic region and thigh   . Full  incontinence of feces   . Asymptomatic varicose veins   . Anemia, unspecified   . Idiopathic osteoporosis   . Melanoma of skin, site unspecified   . Irritable bowel syndrome   . Internal hemorrhoids without mention of complication     bleed easily  . Carotid bruit     hx of  . Peripheral neuropathy     hands/ feet  . HTN (hypertension)   . Esophageal reflux   . Diverticulosis   . Dyslipidemia   . Spinal stenosis, unspecified region other than cervical   . Hypothyroidism   . History of colon cancer     Adenocarcinoma of right colon, stage T2, N1.s/p resection/chemotherapy 2002  . MVP (mitral valve prolapse)   . Female bladder prolapse, acquired 07/17/2010  . Vaginal bleeding, abnormal   . Varicose veins   . Idiopathic osteoporosis   . Vaginitis, atrophic   . Fibroid   . Carotid bruit     doppler normal in the past  . Hemorrhoid     1963 surgical excision  . OA (osteoarthritis)     left hip  . Hx of colonic polyps     1965 surgical excision    Past Surgical History  Procedure Laterality Date  . Hemicolectomy  2002    right  . Colonoscopy w/ polypectomy  2006    3 mm polyp destroyed, diverticulosis  . Esophagogastroduodenoscopy  2009    mild gastritis  . Hemorrhoid surgery  1965  . Hysteroscopy    . Dilation and curettage  of uterus    . Melanoma excision      Allergies  Allergen Reactions  . Amoxil [Amoxicillin] Diarrhea  . Crab [Shellfish Allergy]     sensitivity  . Demerol   . Meperidine Hcl Other (See Comments)    Drop in blood pressure  . Neosporin [Neomycin-Bacitracin Zn-Polymyx]   . Infed [Iron Dextran] Rash    Became very red over face, scalp, and arms   Medications: Patient's Medications  New Prescriptions   No medications on file  Previous Medications   ALPHA-D-GALACTOSIDASE (BEANO PO)    Take 2 tablets by mouth every morning before fiber intake and every evening   ALPRAZOLAM (XANAX) 0.25 MG TABLET    TAKE 1 TABLET EVERY SIX HOURS AS NEEDED  FOR ANXIETY.   ANTISEPTIC ORAL RINSE (BIOTENE) LIQD    15 mLs by Mouth Rinse route as needed for dry mouth.   CALCIUM CARBONATE (TUMS) 500 MG CHEWABLE TABLET    Chew 1 tablet by mouth daily as needed for indigestion (indigestion).    FLUTICASONE (FLONASE) 50 MCG/ACT NASAL SPRAY    ONE SPRAY IN EACH NOSTRIL ONCE DAILY DURING ALLERGY SEASON.   HYDROCORTISONE (ANUSOL-HC) 25 MG SUPPOSITORY    Place 1 suppository (25 mg total) rectally as needed.   HYDROCORTISONE VALERATE OINTMENT (WEST-CORT) 0.2 %    APPLY TWICE DAILY X1 WK. THEN, APPLY VASELINE TWICE DAILY TO AREA. MAY USE THIS CREAM ONCE/WEEK   LACTOBACILLUS (REPHRESH PRO-B) CAPS    Take 1 tablet by mouth daily.    LEVOTHYROXINE (SYNTHROID, LEVOTHROID) 75 MCG TABLET    TAKE 1 TABLET DAILY BEFORE BREAKFAST FOR THYROID.   METH-HYO-M BL-NA PHOS-PH SAL (URIBEL PO)    Take 1 capsule by mouth 2 (two) times daily.    METHYLCELLULOSE (CITRUCEL FIBERSHAKE) ORAL POWDER    Take 1 packet by mouth daily.   MULTIPLE VITAMINS-MINERALS (ICAPS AREDS FORMULA PO)    Take 1 tablet by mouth 2 (two) times daily.    PROBIOTIC PRODUCT (ALIGN) 4 MG CAPS    Take 1 tablet by mouth daily.     PROCTOSOL HC 2.5 % RECTAL CREAM    APPLY RECTALLY TWICE DAILY AS NEEDED.   RANITIDINE (ZANTAC) 150 MG TABLET    Take 150 mg by mouth 2 (two) times daily.  Modified Medications   No medications on file  Discontinued Medications   NONFORMULARY OR COMPOUNDED ITEM    Estradiol 0.02 % 35ml prefilled applicator Sig: apply twice a week    Physical Exam: Filed Vitals:   02/22/15 0850  BP: 118/76  Pulse: 72  Temp: 97.5 F (36.4 C)  TempSrc: Oral  Weight: 100 lb (45.36 kg)   Physical Exam  Constitutional: She appears well-nourished.  HENT:  Head: Normocephalic and atraumatic.  Cardiovascular: Intact distal pulses.  Exam reveals no gallop and no friction rub.   Murmur heard. irregular rhythm, systolic murmur  Abdominal: Soft. Bowel sounds are normal. There is no tenderness. There  is no rebound and no guarding. No hernia.  R abdominal distention mildly larger than left  Musculoskeletal:  Uses a walker, some arthritic joint deformity in hands  Neurological: She is alert. Coordination normal.  Skin: Skin is warm and dry.  Psychiatric: She has a normal mood and affect. Her behavior is normal. Thought content normal.    Labs reviewed: Basic Metabolic Panel:  Recent Labs  03/22/14 06/05/14 1635 07/19/14 10/25/14  NA 143 138 140 140  K 4.0 3.5* 3.6 3.7  CL  --  99  --   --   GLUCOSE  --  110*  --   --   BUN 19 15 21 13   CREATININE 0.7 0.70 0.6 0.6  TSH 1.90  --  1.41  --    Liver Function Tests:  Recent Labs  03/22/14 07/19/14  AST 27 28  ALT 13 14  ALKPHOS 86 79   No results for input(s): LIPASE, AMYLASE in the last 8760 hours. No results for input(s): AMMONIA in the last 8760 hours. CBC:  Recent Labs  03/22/14 05/30/14 1425 06/05/14 1635 10/25/14  WBC 4.4 5.9  --  4.5  NEUTROABS  --  4.3  --   --   HGB 12.3 12.5 12.6 12.1  HCT 36 38.2 37.0 36  MCV  --  93.6  --   --   PLT 180 187.0  --  198   Lipid Panel:  Recent Labs  07/19/14  CHOL 166  HDL 56  LDLCALC 104  TRIG 132   Lab Results  Component Value Date   HGBA1C 5.9 12/02/2006    Procedures since last visit:  Assessment/Plan 1. Chronic constipation- ongoing. Continue current bowel regimen. Recommended decrease fiber intake as this may be increasing bloating and gas. Also ensure adequate fluid intake with Citrucel.   2. Urinary retention with incomplete bladder emptying- controlled with regular urinary catheterization.  3. Internal and external bleeding hemorrhoids- continue suppositories as needed for symptom control.  4. Colon cancer- she has a known mass. Declines intervention at this time. This may be contributing to her abdominal swelling, bloating and bleeding. Continue to monitor for recurrence or complications.      Next appt: Follow-up with Dr. Mariea Clonts as scheduled in  November.  Mariana Kaufman, RN, BSN Nurse Practitioner- Student [facility] [address], [place] [address] Office Phone:  [phone] Office Fax:  [phone]

## 2015-03-08 DIAGNOSIS — R339 Retention of urine, unspecified: Secondary | ICD-10-CM | POA: Diagnosis not present

## 2015-03-08 DIAGNOSIS — N390 Urinary tract infection, site not specified: Secondary | ICD-10-CM | POA: Diagnosis not present

## 2015-03-14 ENCOUNTER — Encounter: Payer: Self-pay | Admitting: Nurse Practitioner

## 2015-03-14 ENCOUNTER — Ambulatory Visit (INDEPENDENT_AMBULATORY_CARE_PROVIDER_SITE_OTHER): Payer: Medicare Other | Admitting: Nurse Practitioner

## 2015-03-14 VITALS — BP 110/66 | HR 73 | Temp 97.9°F | Resp 20 | Ht <= 58 in | Wt 99.8 lb

## 2015-03-14 DIAGNOSIS — K644 Residual hemorrhoidal skin tags: Secondary | ICD-10-CM | POA: Diagnosis not present

## 2015-03-14 DIAGNOSIS — K648 Other hemorrhoids: Secondary | ICD-10-CM | POA: Diagnosis not present

## 2015-03-14 DIAGNOSIS — Z23 Encounter for immunization: Secondary | ICD-10-CM

## 2015-03-14 DIAGNOSIS — K5900 Constipation, unspecified: Secondary | ICD-10-CM

## 2015-03-14 DIAGNOSIS — K5909 Other constipation: Secondary | ICD-10-CM

## 2015-03-14 NOTE — Patient Instructions (Signed)
Cont suppositories as ordered they are effective for bleeding, may need help inserting them if you are unable to do so yourself.   Make sure your constipation is well controlled so you are not straining

## 2015-03-14 NOTE — Progress Notes (Signed)
Patient ID: [name] Small, female   DOB: 1917-03-09, 81 y.o.   MRN: [id number]    PCP: Hollace Kinnier, DO  Advanced Directive information Does patient have an advance directive?: Yes  Allergies  Allergen Reactions  . Amoxil [Amoxicillin] Diarrhea  . Crab [Shellfish Allergy]     sensitivity  . Demerol   . Meperidine Hcl Other (See Comments)    Drop in blood pressure  . Neosporin [Neomycin-Bacitracin Zn-Polymyx]   . Infed [Iron Dextran] Rash    Became very red over face, scalp, and arms    Chief Complaint  Patient presents with  . Acute Visit    bleeding hemorrhoids has had for years wants to know what she can do to help stop or slow the bleeding     HPI: Patient is a 81 y.o. female seen in the office today du eto bleeding hemorrhoids. This is not a new issue. In the past GI wanted to clip and tie hemorrhoids in the past but she did not get this done. Has a hx of constipation and takes a lot of medication to help with this.  When she was in last was told to use rectal suppository as needed for bleeding. They are currently expensive and having a hard time getting them inserted. Last night her suppository melted before she got it in. Was told by a friend that suppository is only for pain.  Constipation is controlled at this time. Does not strain when she goes to the bathroom.    Review of Systems:  Review of Systems  Constitutional: Negative.  Negative for activity change, appetite change and fatigue.  Respiratory: Negative for shortness of breath.   Cardiovascular: Negative for chest pain and palpitations.  Gastrointestinal: Positive for constipation (controlled on medication). Negative for nausea, vomiting, abdominal pain, diarrhea and rectal pain.       Bleeding hemorhoids  Neurological: Negative for dizziness, weakness and light-headedness.    Past Medical History  Diagnosis Date  . 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 Osteoarthrosis, unspecified whether generalized  or localized, pelvic region and thigh   . Full incontinence of feces   . Asymptomatic varicose veins   . Anemia, unspecified   . Idiopathic osteoporosis   . Melanoma of skin, site unspecified   . Irritable bowel syndrome   . Internal hemorrhoids without mention of complication     bleed easily  . Carotid bruit     hx of  . Peripheral neuropathy     hands/ feet  . HTN (hypertension)   . Esophageal reflux   . Diverticulosis   . Dyslipidemia   . Spinal stenosis, unspecified region other than cervical   . Hypothyroidism   . History of colon cancer     Adenocarcinoma of right colon, stage T2, N1.s/p resection/chemotherapy 2002  . MVP (mitral valve prolapse)   . Female bladder prolapse, acquired 07/17/2010  . Vaginal bleeding, abnormal   . Varicose veins   . Idiopathic osteoporosis   . Vaginitis, atrophic   . Fibroid   . Carotid bruit     doppler normal in the past  . Hemorrhoid     1963 surgical excision  . OA (osteoarthritis)     left hip  . Hx of colonic polyps     1965 surgical excision   Past Surgical History  Procedure Laterality Date  . Hemicolectomy  2002    right  . Colonoscopy w/ polypectomy  2006    3 mm polyp destroyed, diverticulosis  .  Esophagogastroduodenoscopy  2009    mild gastritis  . Hemorrhoid surgery  1965  . Hysteroscopy    . Dilation and curettage of uterus    . Melanoma excision     Social History:   reports that she quit smoking about 71 years ago. Her smoking use included Cigarettes. She quit after 2 years of use. She has never used smokeless tobacco. She reports that she drinks alcohol. She reports that she does not use illicit drugs.  Family History  Problem Relation Age of Onset  . Stomach cancer Maternal Grandfather   . Colon cancer Neg Hx   . Heart disease Father   . Diabetes Father   . Heart disease Sister     Medications: Patient's Medications  New Prescriptions   No medications on file  Previous Medications    ALPHA-D-GALACTOSIDASE (BEANO PO)    Take 2 tablets by mouth every morning before fiber intake and every evening   ALPRAZOLAM (XANAX) 0.25 MG TABLET    TAKE 1 TABLET EVERY SIX HOURS AS NEEDED FOR ANXIETY.   ANTISEPTIC ORAL RINSE (BIOTENE) LIQD    15 mLs by Mouth Rinse route as needed for dry mouth.   CALCIUM CARBONATE (TUMS) 500 MG CHEWABLE TABLET    Chew 1 tablet by mouth daily as needed for indigestion (indigestion).    FLUTICASONE (FLONASE) 50 MCG/ACT NASAL SPRAY    ONE SPRAY IN EACH NOSTRIL ONCE DAILY DURING ALLERGY SEASON.   HYDROCORTISONE (ANUSOL-HC) 25 MG SUPPOSITORY    Place 1 suppository (25 mg total) rectally as needed.   HYDROCORTISONE VALERATE OINTMENT (WEST-CORT) 0.2 %    APPLY TWICE DAILY X1 WK. THEN, APPLY VASELINE TWICE DAILY TO AREA. MAY USE THIS CREAM ONCE/WEEK   LACTOBACILLUS (REPHRESH PRO-B) CAPS    Take 1 tablet by mouth daily.    LEVOTHYROXINE (SYNTHROID, LEVOTHROID) 75 MCG TABLET    TAKE 1 TABLET DAILY BEFORE BREAKFAST FOR THYROID.   METH-HYO-M BL-NA PHOS-PH SAL (URIBEL PO)    Take 1 capsule by mouth 2 (two) times daily.    METHYLCELLULOSE (CITRUCEL FIBERSHAKE) ORAL POWDER    Take 1 packet by mouth daily.   MULTIPLE VITAMINS-MINERALS (ICAPS AREDS FORMULA PO)    Take 1 tablet by mouth 2 (two) times daily.    PROBIOTIC PRODUCT (ALIGN) 4 MG CAPS    Take 1 tablet by mouth daily.     PROCTOSOL HC 2.5 % RECTAL CREAM    APPLY RECTALLY TWICE DAILY AS NEEDED.   RANITIDINE (ZANTAC) 150 MG TABLET    Take 150 mg by mouth 2 (two) times daily.  Modified Medications   No medications on file  Discontinued Medications   No medications on file     Physical Exam:  Filed Vitals:   03/14/15 1507  BP: 110/66  Pulse: 73  Temp: 97.9 F (36.6 C)  TempSrc: Oral  Resp: 20  Height: 4\' 8"  (1.422 m)  Weight: 99 lb 12.8 oz (45.269 kg)  SpO2: 96%   Body mass index is 22.39 kg/(m^2).  Physical Exam  Constitutional: She appears well-nourished.  HENT:  Head: Normocephalic and atraumatic.    Cardiovascular: Intact distal pulses.  Exam reveals no gallop and no friction rub.   Murmur heard. irregular rhythm, systolic murmur  Pulmonary/Chest: Effort normal and breath sounds normal.  Abdominal: Soft. Bowel sounds are normal. No hernia.  Genitourinary: Rectal exam shows external hemorrhoid (large hemorrhoid).  Musculoskeletal:  Uses a walker, some arthritic joint deformity in hands  Neurological: She is alert. Coordination  normal.  Skin: Skin is warm and dry.  Psychiatric: She has a normal mood and affect. Her behavior is normal. Thought content normal.    Labs reviewed: Basic Metabolic Panel:  Recent Labs  03/22/14 06/05/14 1635 07/19/14 10/25/14  NA 143 138 140 140  K 4.0 3.5* 3.6 3.7  CL  --  99  --   --   GLUCOSE  --  110*  --   --   BUN 19 15 21 13   CREATININE 0.7 0.70 0.6 0.6  TSH 1.90  --  1.41  --    Liver Function Tests:  Recent Labs  03/22/14 07/19/14  AST 27 28  ALT 13 14  ALKPHOS 86 79   No results for input(s): LIPASE, AMYLASE in the last 8760 hours. No results for input(s): AMMONIA in the last 8760 hours. CBC:  Recent Labs  03/22/14 05/30/14 1425 06/05/14 1635 10/25/14  WBC 4.4 5.9  --  4.5  NEUTROABS  --  4.3  --   --   HGB 12.3 12.5 12.6 12.1  HCT 36 38.2 37.0 36  MCV  --  93.6  --   --   PLT 180 187.0  --  198   Lipid Panel:  Recent Labs  07/19/14  CHOL 166  HDL 56  LDLCALC 104  TRIG 132   TSH:  Recent Labs  03/22/14 07/19/14  TSH 1.90 1.41   A1C: Lab Results  Component Value Date   HGBA1C 5.9 12/02/2006     Assessment/Plan 1. Internal and external bleeding hemorrhoids -to cont current Anusol suppository as needed for bleeding hemorrhoids, reassurance given to pt. Pt does not want procedures from GI for hemorrhoids  2. Chronic constipation Well controlled on current regimen. To continue at this time.    Carlos American. Harle Battiest  [facility] [phone] 8 am - 5  pm) [phone] (after hours)

## 2015-03-15 DIAGNOSIS — A4902 Methicillin resistant Staphylococcus aureus infection, unspecified site: Secondary | ICD-10-CM | POA: Diagnosis not present

## 2015-03-26 ENCOUNTER — Other Ambulatory Visit: Payer: Self-pay | Admitting: Internal Medicine

## 2015-04-06 ENCOUNTER — Telehealth: Payer: Self-pay | Admitting: Cardiology

## 2015-04-06 NOTE — Telephone Encounter (Signed)
Pt c/o BP issue: STAT if pt c/o blurred vision, one-sided weakness or slurred speech  1. What are your last 5 BP readings? 100/60  2. Are you having any other symptoms (ex. Dizziness, headache, blurred vision, passed out)? No  3. What is your BP issue? Pt calling stating that this is the lowest her BP has ever been

## 2015-04-06 NOTE — Telephone Encounter (Signed)
Pt states that she is concerned because her BP has never been as low as 100/60. Pt denies fatigue, weakness, dizziness or any other symptoms. Pt states that she feels fine. Pt seen by nurse at [facility] this morning and was advised that her BP was fine and also has a call into the physician at [facility] to come by and see her. Advised pt that she can increase her fluids and prop her feet up to bring her pressure back up slightly. Pt verbalized understanding and was appreciative for call.

## 2015-04-25 DIAGNOSIS — D649 Anemia, unspecified: Secondary | ICD-10-CM | POA: Diagnosis not present

## 2015-04-25 DIAGNOSIS — E039 Hypothyroidism, unspecified: Secondary | ICD-10-CM | POA: Diagnosis not present

## 2015-04-25 DIAGNOSIS — I10 Essential (primary) hypertension: Secondary | ICD-10-CM | POA: Diagnosis not present

## 2015-04-25 LAB — BASIC METABOLIC PANEL
BUN: 16 mg/dL (ref 4–21)
Creatinine: 0.7 mg/dL (ref 0.5–1.1)
Glucose: 92 mg/dL
Potassium: 4.3 mmol/L (ref 3.4–5.3)
SODIUM: 140 mmol/L (ref 137–147)

## 2015-04-25 LAB — CBC AND DIFFERENTIAL
HEMATOCRIT: 35 % — AB (ref 36–46)
HEMOGLOBIN: 12.5 g/dL (ref 12.0–16.0)
Platelets: 205 10*3/uL (ref 150–399)
WBC: 5.2 10^3/mL

## 2015-04-25 LAB — HEPATIC FUNCTION PANEL
ALK PHOS: 97 U/L (ref 25–125)
ALT: 14 U/L (ref 7–35)
AST: 27 U/L (ref 13–35)
BILIRUBIN, TOTAL: 0.5 mg/dL

## 2015-04-25 LAB — TSH: TSH: 1.18 u[IU]/mL (ref 0.41–5.90)

## 2015-04-26 ENCOUNTER — Other Ambulatory Visit: Payer: Self-pay

## 2015-05-03 ENCOUNTER — Encounter: Payer: Self-pay | Admitting: Internal Medicine

## 2015-05-03 ENCOUNTER — Non-Acute Institutional Stay: Payer: Medicare Other | Admitting: Internal Medicine

## 2015-05-03 VITALS — BP 132/64 | HR 68 | Temp 97.9°F | Wt 99.0 lb

## 2015-05-03 DIAGNOSIS — M810 Age-related osteoporosis without current pathological fracture: Secondary | ICD-10-CM

## 2015-05-03 DIAGNOSIS — D649 Anemia, unspecified: Secondary | ICD-10-CM | POA: Diagnosis not present

## 2015-05-03 DIAGNOSIS — E039 Hypothyroidism, unspecified: Secondary | ICD-10-CM | POA: Diagnosis not present

## 2015-05-03 DIAGNOSIS — K5909 Other constipation: Secondary | ICD-10-CM

## 2015-05-03 DIAGNOSIS — K648 Other hemorrhoids: Secondary | ICD-10-CM

## 2015-05-03 DIAGNOSIS — I499 Cardiac arrhythmia, unspecified: Secondary | ICD-10-CM

## 2015-05-03 DIAGNOSIS — N2889 Other specified disorders of kidney and ureter: Secondary | ICD-10-CM

## 2015-05-03 DIAGNOSIS — K5900 Constipation, unspecified: Secondary | ICD-10-CM

## 2015-05-03 DIAGNOSIS — K644 Residual hemorrhoidal skin tags: Secondary | ICD-10-CM

## 2015-05-03 DIAGNOSIS — I10 Essential (primary) hypertension: Secondary | ICD-10-CM | POA: Diagnosis not present

## 2015-05-03 MED ORDER — VITAMIN D 1000 UNITS PO TABS: 1000.0000 [IU] | ORAL_TABLET | Freq: Every day | ORAL | Status: DC

## 2015-05-03 MED ORDER — IBANDRONATE SODIUM 150 MG PO TABS: 150.0000 mg | ORAL_TABLET | ORAL | Status: DC

## 2015-05-03 NOTE — Progress Notes (Signed)
Patient ID: [name] Small, female   DOB: 16-Aug-1916, 81 y.o.   MRN: [id number]   Location:  [facility]  Code Status: DNR  Goals of Care:Advanced Directive information Does patient have an advance directive?: Yes, Type of Advance Directive: Healthcare Power of [place];Living will;Out of facility DNR (pink MOST or yellow form), Pre-existing out of facility DNR order (yellow form or pink MOST form): Yellow form placed in chart (order not valid for inpatient use)  Chief Complaint  Patient presents with  . Medical Management of Chronic Issues    blood pressure, anemia, thyroid    HPI: Patient is a 81 y.o. white female seen in the [facility] today for med mgt of chronic illnesses and review of her labs.    She has chronic concerns with hemorrhoids, large uterine fibroid, uterine prolapse and cystocele, known left renal mass for which she has opted for palliative measures.    Says her hemorrhoids bleed about 1/3 of the time. Has several bms per day due to fiber, but if she doesn't eat it, she'll be constipated.  Loves fiber one honey clusters.  Beano helps gas, but sometimes gas interferes with her passing her urine, she says.  Has also tried gas-x.  Has also used gaviscon for gas.    HTN:  bp at goal.  No dizziness.  Anemia: hgb is normal at this time.  hct just the slightest bit low.    Hypothyroidism:  TSH normal.  Cont same dose synthroid.  Is a little weak in her upper thighs when she gets up to walk.  Previously had been biking every other day for 20 mins.  Has difficulty putting one foot in front of the other.  Is back to riding the bike.  Also walks over to health care to see friends.  Uses walker all of the time except a little in her house.  Feels good afterwards.  Wants to be as comfortable as she can while she is living.  Says her mother passed at 22 after two falls with a surgery.   Osteoporosis:  Does not take vitamin D right now but agrees to start.   Fosamax did not agree with her.  Took something monthly and stopped it for some reason but does not recall what (says not boniva--unclear what it was).     Review of Systems:  ROS  Past Medical History  Diagnosis Date  . 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 Osteoarthrosis, unspecified whether generalized or localized, pelvic region and thigh   . Full incontinence of feces   . Asymptomatic varicose veins   . Anemia, unspecified   . Idiopathic osteoporosis   . Melanoma of skin, site unspecified   . Irritable bowel syndrome   . Internal hemorrhoids without mention of complication     bleed easily  . Carotid bruit     hx of  . Peripheral neuropathy (HCC)     hands/ feet  . HTN (hypertension)   . Esophageal reflux   . Diverticulosis   . Dyslipidemia   . Spinal stenosis, unspecified region other than cervical   . Hypothyroidism   . History of colon cancer     Adenocarcinoma of right colon, stage T2, N1.s/p resection/chemotherapy 2002  . MVP (mitral valve prolapse)   . Female bladder prolapse, acquired 07/17/2010  . Vaginal bleeding, abnormal   . Varicose veins   . Idiopathic osteoporosis   . Vaginitis, atrophic   . Fibroid   . Carotid bruit  doppler normal in the past  . Hemorrhoid     1963 surgical excision  . OA (osteoarthritis)     left hip  . Hx of colonic polyps     1965 surgical excision    Past Surgical History  Procedure Laterality Date  . Hemicolectomy  2002    right  . Colonoscopy w/ polypectomy  2006    3 mm polyp destroyed, diverticulosis  . Esophagogastroduodenoscopy  2009    mild gastritis  . Hemorrhoid surgery  1965  . Hysteroscopy    . Dilation and curettage of uterus    . Melanoma excision      Social History:   reports that she quit smoking about 71 years ago. Her smoking use included Cigarettes. She quit after 2 years of use. She has never used smokeless tobacco. She reports that she drinks alcohol. She reports that she does not use illicit  drugs.  Allergies  Allergen Reactions  . Amoxil [Amoxicillin] Diarrhea  . Crab [Shellfish Allergy]     sensitivity  . Demerol   . Meperidine Hcl Other (See Comments)    Drop in blood pressure  . Neosporin [Neomycin-Bacitracin Zn-Polymyx]   . Infed [Iron Dextran] Rash    Became very red over face, scalp, and arms    Medications: Patient's Medications  New Prescriptions   No medications on file  Previous Medications   ALPHA-D-GALACTOSIDASE (BEANO PO)    Take 2 tablets by mouth every morning before fiber intake and every evening   ALPRAZOLAM (XANAX) 0.25 MG TABLET    TAKE 1 TABLET EVERY SIX HOURS AS NEEDED FOR ANXIETY.   ANTISEPTIC ORAL RINSE (BIOTENE) LIQD    15 mLs by Mouth Rinse route as needed for dry mouth.   CALCIUM CARBONATE (TUMS) 500 MG CHEWABLE TABLET    Chew 1 tablet by mouth daily as needed for indigestion (indigestion).    FLUTICASONE (FLONASE) 50 MCG/ACT NASAL SPRAY    ONE SPRAY IN EACH NOSTRIL ONCE DAILY DURING ALLERGY SEASON.   HYDROCORTISONE (ANUSOL-HC) 25 MG SUPPOSITORY    Place 1 suppository (25 mg total) rectally as needed.   HYDROCORTISONE VALERATE OINTMENT (WEST-CORT) 0.2 %    APPLY TWICE DAILY X1 WK. THEN, APPLY VASELINE TWICE DAILY TO AREA. MAY USE THIS CREAM ONCE/WEEK   LACTOBACILLUS (REPHRESH PRO-B) CAPS    Take 1 tablet by mouth daily.    LEVOTHYROXINE (SYNTHROID, LEVOTHROID) 75 MCG TABLET    TAKE 1 TABLET DAILY BEFORE BREAKFAST FOR THYROID.   METH-HYO-M BL-NA PHOS-PH SAL (URIBEL PO)    Take 1 capsule by mouth 2 (two) times daily.    METHYLCELLULOSE (CITRUCEL FIBERSHAKE) ORAL POWDER    Take 1 packet by mouth daily.   PROBIOTIC PRODUCT (ALIGN) 4 MG CAPS    Take 1 tablet by mouth daily.     PROCTOSOL HC 2.5 % RECTAL CREAM    APPLY RECTALLY TWICE DAILY AS NEEDED.   RANITIDINE (ZANTAC) 150 MG TABLET    Take 150 mg by mouth 2 (two) times daily.  Modified Medications   No medications on file  Discontinued Medications   MULTIPLE VITAMINS-MINERALS (ICAPS AREDS  FORMULA PO)    Take 1 tablet by mouth 2 (two) times daily.      Physical Exam: Filed Vitals:   05/03/15 1105  BP: 132/64  Pulse: 68  Temp: 97.9 F (36.6 C)  TempSrc: Oral  Weight: 99 lb (44.906 kg)  SpO2: 99%   Body mass index is 22.21 kg/(m^2). Physical Exam  Labs reviewed: Basic Metabolic Panel:  Recent Labs  04/54/09 1635 07/19/14 10/25/14 04/25/15  NA 138 140 140 140  K 3.5* 3.6 3.7 4.3  CL 99  --   --   --   GLUCOSE 110*  --   --   --   BUN 15 21 13 16   CREATININE 0.70 0.6 0.6 0.7  TSH  --  1.41  --  1.18   Liver Function Tests:  Recent Labs  07/19/14 04/25/15  AST 28 27  ALT 14 14  ALKPHOS 79 97   No results for input(s): LIPASE, AMYLASE in the last 8760 hours. No results for input(s): AMMONIA in the last 8760 hours. CBC:  Recent Labs  05/30/14 1425 06/05/14 1635 10/25/14 04/25/15  WBC 5.9  --  4.5 5.2  NEUTROABS 4.3  --   --   --   HGB 12.5 12.6 12.1 12.5  HCT 38.2 37.0 36 35*  MCV 93.6  --   --   --   PLT 187.0  --  198 205   Lipid Panel:  Recent Labs  07/19/14  CHOL 166  HDL 56  LDLCALC 104  TRIG 811   Lab Results  Component Value Date   HGBA1C 5.9 12/02/2006    Procedures since last appt: Patient Care Team: Kermit Balo, DO as PCP - General (Geriatric Medicine) Ok Edwards, MD as Consulting Physician (Gynecology) Mckinley Jewel, MD as Consulting Physician (Ophthalmology) Jethro Bolus, MD as Consulting Physician (Urology) Iva Boop, MD as Consulting Physician (Gastroenterology) Benjiman Core, MD as Consulting Physician (Oncology)  Assessment/Plan 1. Senile osteoporosis -after prolonged discussion, she agrees to monthly boniva for osteoporosis--will not consider others and also will take vitamin D regularly - cholecalciferol (VITAMIN D) 1000 UNITS tablet; Take 1 tablet (1,000 Units total) by mouth daily.  Dispense: 30 tablet; Refill: 3 - ibandronate (BONIVA) 150 MG tablet; Take 1 tablet (150 mg total) by  mouth every 30 (thirty) days. Take in the morning with full glass of water, on empty stomach, do not take anything else by mouth or lie down for 30 min.  Dispense: 1 tablet; Refill: 3  2. Irregular heart beat - revealed no explanation so suspect pvcs which were noted on ekgs of the past in 2012 and 2014 - EKG 12-Lead  3. Internal and external bleeding hemorrhoids -ongoing problem for her -she eats plenty of fiber and drinks a lot of water -they are quite severe and she does not want any surgery -cont anusol and suppository  4. Chronic constipation -cont current regimen which is usually effective for her with rare miralax when it isn't  5. Renal mass, left -chronic, has opted not to have further workup or intervention at her 89 yrs  6. Hypothyroidism, unspecified hypothyroidism type -continues on synthroid daily and tsh was wnl  7. Anemia, unspecified anemia type -anemia is stable on labs, not taking iron or other supplements  8. Benign essential HTN -bp at goal, not on meds  Labs/tests ordered: EKG done today:  No significant change from previous EKG 2012-2014. NSR at 66, no prvcs on ekg, but audible  Next appt:  3 mos for med mgt  Loyd Marhefka L. Marnette Perkins, D.O. Geriatrics [facility] [address], [place] [address] Cell Phone (Mon-Fri 8am-5pm):  [phone] On Call:  [phone] & follow prompts after 5pm & weekends Office Phone:  [phone] Office Fax:  [phone]

## 2015-05-15 ENCOUNTER — Other Ambulatory Visit: Payer: Self-pay | Admitting: Nurse Practitioner

## 2015-05-24 DIAGNOSIS — H6123 Impacted cerumen, bilateral: Secondary | ICD-10-CM | POA: Diagnosis not present

## 2015-05-24 DIAGNOSIS — H903 Sensorineural hearing loss, bilateral: Secondary | ICD-10-CM | POA: Diagnosis not present

## 2015-05-24 DIAGNOSIS — H6093 Unspecified otitis externa, bilateral: Secondary | ICD-10-CM | POA: Diagnosis not present

## 2015-06-05 ENCOUNTER — Other Ambulatory Visit: Payer: Self-pay | Admitting: Internal Medicine

## 2015-06-23 ENCOUNTER — Other Ambulatory Visit: Payer: Self-pay | Admitting: Internal Medicine

## 2015-06-30 DIAGNOSIS — Z85828 Personal history of other malignant neoplasm of skin: Secondary | ICD-10-CM | POA: Diagnosis not present

## 2015-06-30 DIAGNOSIS — D1801 Hemangioma of skin and subcutaneous tissue: Secondary | ICD-10-CM | POA: Diagnosis not present

## 2015-06-30 DIAGNOSIS — D485 Neoplasm of uncertain behavior of skin: Secondary | ICD-10-CM | POA: Diagnosis not present

## 2015-06-30 DIAGNOSIS — L821 Other seborrheic keratosis: Secondary | ICD-10-CM | POA: Diagnosis not present

## 2015-06-30 DIAGNOSIS — L814 Other melanin hyperpigmentation: Secondary | ICD-10-CM | POA: Diagnosis not present

## 2015-06-30 DIAGNOSIS — C44319 Basal cell carcinoma of skin of other parts of face: Secondary | ICD-10-CM | POA: Diagnosis not present

## 2015-07-24 ENCOUNTER — Other Ambulatory Visit: Payer: Self-pay | Admitting: Gynecology

## 2015-07-24 ENCOUNTER — Other Ambulatory Visit: Payer: Self-pay | Admitting: Internal Medicine

## 2015-07-26 DIAGNOSIS — C44319 Basal cell carcinoma of skin of other parts of face: Secondary | ICD-10-CM | POA: Diagnosis not present

## 2015-08-02 ENCOUNTER — Encounter: Payer: Self-pay | Admitting: Gastroenterology

## 2015-08-09 ENCOUNTER — Non-Acute Institutional Stay: Payer: Medicare Other | Admitting: Internal Medicine

## 2015-08-09 ENCOUNTER — Encounter: Payer: Self-pay | Admitting: Internal Medicine

## 2015-08-09 VITALS — BP 114/62 | HR 80 | Temp 98.0°F | Ht <= 58 in | Wt 100.0 lb

## 2015-08-09 DIAGNOSIS — M810 Age-related osteoporosis without current pathological fracture: Secondary | ICD-10-CM

## 2015-08-09 DIAGNOSIS — R040 Epistaxis: Secondary | ICD-10-CM

## 2015-08-09 DIAGNOSIS — K5900 Constipation, unspecified: Secondary | ICD-10-CM

## 2015-08-09 DIAGNOSIS — K648 Other hemorrhoids: Secondary | ICD-10-CM | POA: Diagnosis not present

## 2015-08-09 DIAGNOSIS — M1612 Unilateral primary osteoarthritis, left hip: Secondary | ICD-10-CM | POA: Diagnosis not present

## 2015-08-09 DIAGNOSIS — K5909 Other constipation: Secondary | ICD-10-CM

## 2015-08-09 DIAGNOSIS — K644 Residual hemorrhoidal skin tags: Secondary | ICD-10-CM | POA: Diagnosis not present

## 2015-08-09 NOTE — Progress Notes (Signed)
Patient ID: [name] Small, female   DOB: [birth year], 82 y.o.   MRN: [id number]   Location:  [facility] [employer] of Service:  Clinic (12) Provider: Zakyra Kukuk L. Renato Gails, D.O., C.M.D.  Code Status: DNR Goals of Care:  Advanced Directives 08/09/2015  Does patient have an advance directive? Yes  Type of Estate agent of [place];Living will;Out of facility DNR (pink MOST or yellow form)  Copy of advanced directive(s) in chart? -  Pre-existing out of facility DNR order (yellow form or pink MOST form) Yellow form placed in chart (order not valid for inpatient use);Pink MOST form placed in chart (order not valid for inpatient use)     Chief Complaint  Patient presents with  . Medical Management of Chronic Issues    medication management blood pressure, anemia, thyroid  . pain in groin    left groin area hurts when she gets up  . nose bleed    HPI: Patient is a 82 y.o. female seen today for medical management of chronic diseases.     Epistaxis for the past couple of weeks.  Not large volumes--just a few drops.  Admits to picking at her nose sometimes.  Has happened w/o picking.  If she compresses her nostril, it stops.  She is using the flonase, but is spraying straight up not avoiding her septum.  Does not use saline right now.  Not on anticoagulation.  Left groin pain:  For 6 wks.  Dull ache when she is walking or she turns in the bed at night.  Still able to sleep (doesn't sleep well but unrelated to this).  No falls or injuries.  No back pain.  Confined to groin area.  Has not tried any pain medications.    Mentioned her standard bleeding hemorrhoids--no worse, just stable.  Apparently she never took the boniva prescribed for osteoporosis per med list notes from [facility].  I discovered this after the visit.    Past Medical History  Diagnosis Date  . 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 Osteoarthrosis, unspecified whether generalized or localized,  pelvic region and thigh   . Full incontinence of feces   . Asymptomatic varicose veins   . Anemia, unspecified   . Idiopathic osteoporosis   . Melanoma of skin, site unspecified   . Irritable bowel syndrome   . Internal hemorrhoids without mention of complication     bleed easily  . Carotid bruit     hx of  . Peripheral neuropathy (HCC)     hands/ feet  . HTN (hypertension)   . Esophageal reflux   . Diverticulosis   . Dyslipidemia   . Spinal stenosis, unspecified region other than cervical   . Hypothyroidism   . History of colon cancer     Adenocarcinoma of right colon, stage T2, N1.s/p resection/chemotherapy 2002  . MVP (mitral valve prolapse)   . Female bladder prolapse, acquired 07/17/2010  . Vaginal bleeding, abnormal   . Varicose veins   . Idiopathic osteoporosis   . Vaginitis, atrophic   . Fibroid   . Carotid bruit     doppler normal in the past  . Hemorrhoid     1963 surgical excision  . OA (osteoarthritis)     left hip  . Hx of colonic polyps     1965 surgical excision    Past Surgical History  Procedure Laterality Date  . Hemicolectomy  2002    right  . Colonoscopy w/ polypectomy  2006  3 mm polyp destroyed, diverticulosis  . Esophagogastroduodenoscopy  2009    mild gastritis  . Hemorrhoid surgery  1965  . Hysteroscopy    . Dilation and curettage of uterus    . Melanoma excision      Allergies  Allergen Reactions  . Amoxil [Amoxicillin] Diarrhea  . Crab [Shellfish Allergy]     sensitivity  . Demerol   . Meperidine Hcl Other (See Comments)    Drop in blood pressure  . Neosporin [Neomycin-Bacitracin Zn-Polymyx]   . Infed [Iron Dextran] Rash    Became very red over face, scalp, and arms      Medication List       This list is accurate as of: 08/09/15 10:24 AM.  Always use your most recent med list.               ALIGN 4 MG Caps  Take 1 tablet by mouth daily.     ALPRAZolam 0.25 MG tablet  Commonly known as:  XANAX  TAKE 1 TABLET  EVERY SIX HOURS AS NEEDED FOR ANXIETY.     antiseptic oral rinse Liqd  15 mLs by Mouth Rinse route as needed for dry mouth.     BEANO PO  Take 2 tablets by mouth every morning before fiber intake and every evening     cholecalciferol 1000 units tablet  Commonly known as:  VITAMIN D  Take 1 tablet (1,000 Units total) by mouth daily.     CITRUCEL FIBERSHAKE oral powder  Generic drug:  methylcellulose  Take 1 packet by mouth daily.     fluticasone 50 MCG/ACT nasal spray  Commonly known as:  FLONASE  ONE SPRAY IN EACH NOSTRIL ONCE DAILY DURING ALLERGY SEASON.     hydrocortisone 25 MG suppository  Commonly known as:  ANUSOL-HC  Place 1 suppository (25 mg total) rectally as needed.     hydrocortisone valerate ointment 0.2 %  Commonly known as:  WEST-CORT  APPLY TWICE DAILY X1 WK. THEN, APPLY VASELINE TWICE DAILY TO AREA. MAY USE THIS CREAM ONCE/WEEK     levothyroxine 75 MCG tablet  Commonly known as:  SYNTHROID, LEVOTHROID  TAKE 1 TABLET DAILY BEFORE BREAKFAST FOR THYROID.     PROCTOSOL HC 2.5 % rectal cream  Generic drug:  hydrocortisone  APPLY RECTALLY TWICE DAILY AS NEEDED.     ranitidine 150 MG tablet  Commonly known as:  ZANTAC  Take 150 mg by mouth 2 (two) times daily.     REPHRESH PRO-B Caps  Take 1 tablet by mouth daily.     TUMS 500 MG chewable tablet  Generic drug:  calcium carbonate  Chew 1 tablet by mouth daily as needed for indigestion (indigestion).     URIBEL PO  Take 1 capsule by mouth 2 (two) times daily.        Review of Systems:  Review of Systems  Constitutional: Negative for fever, chills and unexpected weight change.  HENT: Positive for nosebleeds. Negative for mouth sores, postnasal drip, sore throat and trouble swallowing.   Respiratory: Negative for shortness of breath.   Cardiovascular: Negative for chest pain, palpitations and leg swelling.  Gastrointestinal: Positive for abdominal pain, constipation, anal bleeding and rectal pain.  Negative for nausea, vomiting, diarrhea and blood in stool.  Genitourinary: Positive for urgency and frequency. Negative for dysuria.  Musculoskeletal:       Walks with walker  Skin: Positive for pallor. Negative for color change.  Neurological: Negative for dizziness and weakness.  Hematological:  Bruises/bleeds easily.  Psychiatric/Behavioral: The patient is nervous/anxious.        Some short term memory loss    Health Maintenance  Topic Date Due  . ZOSTAVAX  03/10/1979  . PNA vac Low Risk Adult (2 of 2 - PCV13) 03/17/1998  . PAP SMEAR  10/14/2011  . INFLUENZA VACCINE  01/16/2016  . TETANUS/TDAP  10/08/2021  . DEXA SCAN  Completed    Physical Exam: Filed Vitals:   08/09/15 0941  BP: 114/62  Pulse: 80  Temp: 98 F (36.7 C)  TempSrc: Oral  Height: 4\' 8"  (1.422 m)  Weight: 100 lb (45.36 kg)  SpO2: 99%   Body mass index is 22.43 kg/(m^2). Physical Exam  Constitutional: She is oriented to person, place, and time. No distress.  Thin white female walks with walker  HENT:  No visible bleeding site (not actively bleeding)  Cardiovascular: Normal rate, regular rhythm, normal heart sounds and intact distal pulses.   Pulmonary/Chest: Effort normal and breath sounds normal. No respiratory distress.  Abdominal: Soft. Bowel sounds are normal. She exhibits no distension. There is no tenderness. There is no rebound and no guarding.  Musculoskeletal:  Limited ROM bilateral hips and tenderness in left lower quadrant is more in her groin and worse with movement of her left leg  Neurological: She is alert and oriented to person, place, and time.  Skin: Skin is warm and dry. There is pallor.  Psychiatric:  anxious    Labs reviewed: Basic Metabolic Panel:  Recent Labs  41/32/44 04/25/15  NA 140 140  K 3.7 4.3  BUN 13 16  CREATININE 0.6 0.7  TSH  --  1.18   Liver Function Tests:  Recent Labs  04/25/15  AST 27  ALT 14  ALKPHOS 97   No results for input(s): LIPASE, AMYLASE  in the last 8760 hours. No results for input(s): AMMONIA in the last 8760 hours. CBC:  Recent Labs  10/25/14 04/25/15  WBC 4.5 5.2  HGB 12.1 12.5  HCT 36 35*  PLT 198 205   Lipid Panel: No results for input(s): CHOL, HDL, LDLCALC, TRIG, CHOLHDL, LDLDIRECT in the last 8760 hours. Lab Results  Component Value Date   HGBA1C 5.9 12/02/2006    Assessment/Plan 1. Primary osteoarthritis of left hip Tylenol for hip oa Will get xrays if not improving, but she wouldn't want surgery anyway (but could ensure nothing else treatable like bursitis--does not seem to have this though)  2. Epistaxis flonase spray properly--spray away from septum May use saline for dry nose  3. Chronic constipation -cont current bowel regimen  -she does not stick with what I suggest for any length of time to tell if it works  4. Internal and external bleeding hemorrhoids -cont suppositories and anusol cream as needed, also used witch hazel before with some relief  5.  Senile osteoporosis -not taking her boniva-noted after visit   Delmar Arriaga L. Carden Teel, D.O. Geriatrics [facility] [address], [place] [address] Cell Phone (Mon-Fri 8am-5pm):  [phone] On Call:  [phone] & follow prompts after 5pm & weekends Office Phone:  [phone] Office Fax:  [phone]

## 2015-08-14 DIAGNOSIS — H25813 Combined forms of age-related cataract, bilateral: Secondary | ICD-10-CM | POA: Diagnosis not present

## 2015-08-14 DIAGNOSIS — H0012 Chalazion right lower eyelid: Secondary | ICD-10-CM | POA: Diagnosis not present

## 2015-08-14 DIAGNOSIS — H5203 Hypermetropia, bilateral: Secondary | ICD-10-CM | POA: Diagnosis not present

## 2015-08-14 DIAGNOSIS — H353211 Exudative age-related macular degeneration, right eye, with active choroidal neovascularization: Secondary | ICD-10-CM | POA: Diagnosis not present

## 2015-08-18 DIAGNOSIS — R3912 Poor urinary stream: Secondary | ICD-10-CM | POA: Diagnosis not present

## 2015-08-18 DIAGNOSIS — N390 Urinary tract infection, site not specified: Secondary | ICD-10-CM | POA: Diagnosis not present

## 2015-08-18 DIAGNOSIS — R39198 Other difficulties with micturition: Secondary | ICD-10-CM | POA: Diagnosis not present

## 2015-08-18 DIAGNOSIS — B9561 Methicillin susceptible Staphylococcus aureus infection as the cause of diseases classified elsewhere: Secondary | ICD-10-CM | POA: Diagnosis not present

## 2015-08-18 DIAGNOSIS — Z0000 Encounter for general adult medical examination without abnormal findings: Secondary | ICD-10-CM | POA: Diagnosis not present

## 2015-08-21 ENCOUNTER — Other Ambulatory Visit: Payer: Self-pay | Admitting: Internal Medicine

## 2015-08-22 DIAGNOSIS — H353122 Nonexudative age-related macular degeneration, left eye, intermediate dry stage: Secondary | ICD-10-CM | POA: Diagnosis not present

## 2015-08-22 DIAGNOSIS — H353211 Exudative age-related macular degeneration, right eye, with active choroidal neovascularization: Secondary | ICD-10-CM | POA: Diagnosis not present

## 2015-09-08 ENCOUNTER — Ambulatory Visit (INDEPENDENT_AMBULATORY_CARE_PROVIDER_SITE_OTHER): Payer: Medicare Other | Admitting: Internal Medicine

## 2015-09-08 ENCOUNTER — Encounter: Payer: Self-pay | Admitting: Internal Medicine

## 2015-09-08 VITALS — BP 142/70 | HR 68 | Temp 97.9°F | Ht <= 58 in | Wt 99.0 lb

## 2015-09-08 DIAGNOSIS — R143 Flatulence: Secondary | ICD-10-CM | POA: Insufficient documentation

## 2015-09-08 DIAGNOSIS — K648 Other hemorrhoids: Secondary | ICD-10-CM | POA: Diagnosis not present

## 2015-09-08 DIAGNOSIS — Z23 Encounter for immunization: Secondary | ICD-10-CM | POA: Diagnosis not present

## 2015-09-08 DIAGNOSIS — R339 Retention of urine, unspecified: Secondary | ICD-10-CM | POA: Insufficient documentation

## 2015-09-08 MED ORDER — SIMETHICONE 250 MG PO CAPS: 1.0000 | ORAL_CAPSULE | Freq: Every day | ORAL | Status: DC | PRN

## 2015-09-08 NOTE — Progress Notes (Signed)
Patient ID: [name] Small, female   DOB: 24-Jan-1917, 82 y.o.   MRN: [id number]   Location:  [facility] clinic Provider: Javarion Douty L. Renato Gails, D.O., C.M.D.  Code Status: DNR Goals of Care:  Advanced Directives 09/08/2015  Does patient have an advance directive? Yes  Type of Estate agent of [place];Living will;Out of facility DNR (pink MOST or yellow form)  Copy of advanced directive(s) in chart? Yes  Pre-existing out of facility DNR order (yellow form or pink MOST form) Yellow form placed in chart (order not valid for inpatient use)   Chief Complaint  Patient presents with  . Acute Visit    hemorrhoids   HPI: Patient is a 82 y.o. female seen today for an acute visit for her hemorrhoids.  They are "acting up more than usual".  Says she had a terrible accident at the grocery.  Thought she had to go urinate, but there was stool in her pants when she got to the restroom.  No diarrhea afterwards.  If her hemorrhoids bleed a lot, she has difficulty urinating.  Tries to have a BM each am.  Takes her fiber one honey crust?  If she doesn't go for a while, the hemorrhoids swell and bleed a lot.  Since the episode at the grocery, she had difficulty urinating mon and tues.  Has used rectal suppositories 4x at night.  Still bleeding, but not heavily like it had been just after the fecal incontinence episode.  No matter what she eats, she gets gas.  Has difficulty urinating when she has a lot of flatus also.  Does get catheterized once a night.  They keep a record of her urinary frequency.  Uses citrucel 2 tbs. Same foods don't always create a balance.  Says sometimes she overdoes the greens.      Pt is 82 yo so I am not concerned about her cholesterol levels at this point.  She is not on medications and eats a healthy diet.    To get a shot in her eye for macular degeneration on 4/11.  Med is "off-label" so she plans to discuss this further with her ophthalmologist.    Past Medical History    Diagnosis Date  . 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 Osteoarthrosis, unspecified whether generalized or localized, pelvic region and thigh   . Full incontinence of feces   . Asymptomatic varicose veins   . Anemia, unspecified   . Idiopathic osteoporosis   . Melanoma of skin, site unspecified   . Irritable bowel syndrome   . Internal hemorrhoids without mention of complication     bleed easily  . Carotid bruit     hx of  . Peripheral neuropathy (HCC)     hands/ feet  . HTN (hypertension)   . Esophageal reflux   . Diverticulosis   . Dyslipidemia   . Spinal stenosis, unspecified region other than cervical   . Hypothyroidism   . History of colon cancer     Adenocarcinoma of right colon, stage T2, N1.s/p resection/chemotherapy 2002  . MVP (mitral valve prolapse)   . Female bladder prolapse, acquired 07/17/2010  . Vaginal bleeding, abnormal   . Varicose veins   . Idiopathic osteoporosis   . Vaginitis, atrophic   . Fibroid   . Carotid bruit     doppler normal in the past  . Hemorrhoid     1963 surgical excision  . OA (osteoarthritis)     left hip  . Hx of colonic polyps  1965 surgical excision    Past Surgical History  Procedure Laterality Date  . Hemicolectomy  2002    right  . Colonoscopy w/ polypectomy  2006    3 mm polyp destroyed, diverticulosis  . Esophagogastroduodenoscopy  2009    mild gastritis  . Hemorrhoid surgery  1965  . Hysteroscopy    . Dilation and curettage of uterus    . Melanoma excision      Allergies  Allergen Reactions  . Amoxil [Amoxicillin] Diarrhea  . Crab [Shellfish Allergy]     sensitivity  . Demerol   . Meperidine Hcl Other (See Comments)    Drop in blood pressure  . Neosporin [Neomycin-Bacitracin Zn-Polymyx]   . Infed [Iron Dextran] Rash    Became very red over face, scalp, and arms      Medication List       This list is accurate as of: 09/08/15  8:34 AM.  Always use your most recent med list.               ALIGN  4 MG Caps  Take 1 tablet by mouth daily.     ALPRAZolam 0.25 MG tablet  Commonly known as:  XANAX  TAKE 1 TABLET EVERY SIX HOURS AS NEEDED FOR ANXIETY.     antiseptic oral rinse Liqd  15 mLs by Mouth Rinse route as needed for dry mouth.     BEANO PO  Take 2 tablets by mouth every morning before fiber intake and every evening     cholecalciferol 1000 units tablet  Commonly known as:  VITAMIN D  Take 1 tablet (1,000 Units total) by mouth daily.     CITRUCEL FIBERSHAKE oral powder  Generic drug:  methylcellulose  Take 1 packet by mouth daily.     fluticasone 50 MCG/ACT nasal spray  Commonly known as:  FLONASE  ONE SPRAY IN EACH NOSTRIL ONCE DAILY DURING ALLERGY SEASON.     hydrocortisone 25 MG suppository  Commonly known as:  ANUSOL-HC  Place 1 suppository (25 mg total) rectally as needed.     hydrocortisone valerate ointment 0.2 %  Commonly known as:  WEST-CORT  APPLY TWICE DAILY X1 WK. THEN, APPLY VASELINE TWICE DAILY TO AREA. MAY USE THIS CREAM ONCE/WEEK     levothyroxine 75 MCG tablet  Commonly known as:  SYNTHROID, LEVOTHROID  TAKE 1 TABLET DAILY BEFORE BREAKFAST FOR THYROID.     PROCTOSOL HC 2.5 % rectal cream  Generic drug:  hydrocortisone  APPLY RECTALLY TWICE DAILY AS NEEDED.     ranitidine 150 MG tablet  Commonly known as:  ZANTAC  Take 150 mg by mouth 2 (two) times daily.     REPHRESH PRO-B Caps  Take 1 tablet by mouth daily.     TUMS 500 MG chewable tablet  Generic drug:  calcium carbonate  Chew 1 tablet by mouth daily as needed for indigestion (indigestion).     URIBEL PO  Take 1 capsule by mouth 2 (two) times daily.        Review of Systems:  Review of Systems  Constitutional: Positive for malaise/fatigue. Negative for fever and chills.  Respiratory: Negative for shortness of breath.   Cardiovascular: Negative for chest pain.  Gastrointestinal: Positive for abdominal pain, constipation and blood in stool. Negative for melena.  Genitourinary:  Positive for urgency and frequency. Negative for dysuria, hematuria and flank pain.  Musculoskeletal: Negative for falls.  Neurological: Negative for dizziness.    Health Maintenance  Topic Date Due  .  ZOSTAVAX  03/10/1979  . PNA vac Low Risk Adult (2 of 2 - PCV13) 03/17/1998  . PAP SMEAR  10/14/2011  . INFLUENZA VACCINE  01/16/2016  . TETANUS/TDAP  10/08/2021  . DEXA SCAN  Completed   Physical Exam: Filed Vitals:   09/08/15 0819  BP: 142/70  Pulse: 68  Temp: 97.9 F (36.6 C)  TempSrc: Oral  Height: 4\' 8"  (1.422 m)  Weight: 99 lb (44.906 kg)  SpO2: 98%   Body mass index is 22.21 kg/(m^2). Physical Exam  Constitutional: She is oriented to person, place, and time.  Thin white female  Cardiovascular: Normal rate, regular rhythm, normal heart sounds and intact distal pulses.   Pulmonary/Chest: Effort normal and breath sounds normal.  Abdominal: Soft. Bowel sounds are normal. She exhibits distension. There is no tenderness. There is no rebound and no guarding.  Genitourinary: Guaiac negative stool.  Internal hemorrhoids protruding from rectum with one appearing especially friable visible; no blood or stool seen, no tenderness; pt had incontinent episode immediately before exam when trying to turn on her side  Musculoskeletal: Normal range of motion.  Ambulates with walker  Neurological: She is alert and oriented to person, place, and time.  Skin: Skin is warm and dry. There is pallor.  Psychiatric: She has a normal mood and affect.    Labs reviewed: Basic Metabolic Panel:  Recent Labs  93/71/69 04/25/15  NA 140 140  K 3.7 4.3  BUN 13 16  CREATININE 0.6 0.7  TSH  --  1.18   Liver Function Tests:  Recent Labs  04/25/15  AST 27  ALT 14  ALKPHOS 97   No results for input(s): LIPASE, AMYLASE in the last 8760 hours. No results for input(s): AMMONIA in the last 8760 hours. CBC:  Recent Labs  10/25/14 04/25/15  WBC 4.5 5.2  HGB 12.1 12.5  HCT 36 35*  PLT 198  205   Lipid Panel: No results for input(s): CHOL, HDL, LDLCALC, TRIG, CHOLHDL, LDLDIRECT in the last 8760 hours. Lab Results  Component Value Date   HGBA1C 5.9 12/02/2006    Assessment/Plan 1. Bleeding internal hemorrhoids -has used suppositories with improvement in her bleeding -denies pain -noted friability present so pt is going to use anusol cream now on the affected area  2. Urinary retention -seems to worsen with constipation and flatus -she is going to try a different flatus medication--phazyme max strength 250mg  daily which I sent to [facility] for her -staff to call her with the name of the medication -hopefully this will help more with her gas b/c it's hard for her to regulate her diet due to significant need for fiber to keep stools soft and moving  3. Excessive flatus Try phazyme for a while instead of gas-x or beano She's also going to decrease her zantac to daily b/c she's not having upper GI symptoms/GERD these days--if does well, will stop it completely  4. Need for vaccination with 13-polyvalent pneumococcal conjugate vaccine -prevnar given  Labs/tests ordered:  No new  Next appt:  11/15/2015 med mgt  Tikia Skilton L. Ever Halberg, D.O. Geriatrics [facility] [address], [place] [address] Cell Phone (Mon-Fri 8am-5pm):  [phone] On Call:  [phone] & follow prompts after 5pm & weekends Office Phone:  [phone] Office Fax:  [phone]

## 2015-09-14 DIAGNOSIS — H353211 Exudative age-related macular degeneration, right eye, with active choroidal neovascularization: Secondary | ICD-10-CM | POA: Diagnosis not present

## 2015-09-20 ENCOUNTER — Other Ambulatory Visit: Payer: Self-pay | Admitting: Internal Medicine

## 2015-10-10 ENCOUNTER — Other Ambulatory Visit: Payer: Self-pay | Admitting: Internal Medicine

## 2015-10-11 ENCOUNTER — Telehealth: Payer: Self-pay | Admitting: *Deleted

## 2015-10-11 NOTE — Telephone Encounter (Signed)
Pt calling asking for an appointment because her hemorrhoids are giving her more problems, per Dr. Mariea Clonts can add on next Wednesday, pt has been seen for this before.  Spoke with patient and advised results, appt scheduled 10/18/15

## 2015-10-12 DIAGNOSIS — H353211 Exudative age-related macular degeneration, right eye, with active choroidal neovascularization: Secondary | ICD-10-CM | POA: Diagnosis not present

## 2015-10-18 ENCOUNTER — Encounter: Payer: Medicare Other | Admitting: Internal Medicine

## 2015-11-07 DIAGNOSIS — D649 Anemia, unspecified: Secondary | ICD-10-CM | POA: Diagnosis not present

## 2015-11-07 DIAGNOSIS — E039 Hypothyroidism, unspecified: Secondary | ICD-10-CM | POA: Diagnosis not present

## 2015-11-07 DIAGNOSIS — I10 Essential (primary) hypertension: Secondary | ICD-10-CM | POA: Diagnosis not present

## 2015-11-07 DIAGNOSIS — E785 Hyperlipidemia, unspecified: Secondary | ICD-10-CM | POA: Diagnosis not present

## 2015-11-07 LAB — BASIC METABOLIC PANEL
BUN: 11 mg/dL (ref 4–21)
Creatinine: 0.5 mg/dL (ref 0.5–1.1)
Glucose: 83 mg/dL
Potassium: 4 mmol/L (ref 3.4–5.3)
Sodium: 142 mmol/L (ref 137–147)

## 2015-11-07 LAB — LIPID PANEL
Cholesterol: 174 mg/dL (ref 0–200)
HDL: 79 mg/dL — AB (ref 35–70)
LDL Cholesterol: 78 mg/dL
Triglycerides: 87 mg/dL (ref 40–160)

## 2015-11-07 LAB — CBC AND DIFFERENTIAL
HCT: 36 % (ref 36–46)
Hemoglobin: 12.6 g/dL (ref 12.0–16.0)
Platelets: 203 10*3/uL (ref 150–399)
WBC: 4.8 10^3/mL

## 2015-11-13 ENCOUNTER — Other Ambulatory Visit: Payer: Self-pay | Admitting: Internal Medicine

## 2015-11-14 ENCOUNTER — Other Ambulatory Visit: Payer: Self-pay | Admitting: Internal Medicine

## 2015-11-15 ENCOUNTER — Non-Acute Institutional Stay: Payer: Medicare Other | Admitting: Internal Medicine

## 2015-11-15 ENCOUNTER — Encounter: Payer: Self-pay | Admitting: Internal Medicine

## 2015-11-15 VITALS — BP 120/70 | HR 66 | Temp 98.6°F | Ht <= 58 in | Wt 97.5 lb

## 2015-11-15 DIAGNOSIS — R339 Retention of urine, unspecified: Secondary | ICD-10-CM | POA: Diagnosis not present

## 2015-11-15 DIAGNOSIS — E038 Other specified hypothyroidism: Secondary | ICD-10-CM | POA: Diagnosis not present

## 2015-11-15 DIAGNOSIS — M810 Age-related osteoporosis without current pathological fracture: Secondary | ICD-10-CM

## 2015-11-15 DIAGNOSIS — E034 Atrophy of thyroid (acquired): Secondary | ICD-10-CM | POA: Diagnosis not present

## 2015-11-15 DIAGNOSIS — H353290 Exudative age-related macular degeneration, unspecified eye, stage unspecified: Secondary | ICD-10-CM | POA: Diagnosis not present

## 2015-11-15 DIAGNOSIS — K5900 Constipation, unspecified: Secondary | ICD-10-CM

## 2015-11-15 DIAGNOSIS — K649 Unspecified hemorrhoids: Secondary | ICD-10-CM

## 2015-11-15 DIAGNOSIS — K5909 Other constipation: Secondary | ICD-10-CM

## 2015-11-15 MED ORDER — HYDROCORTISONE ACETATE 25 MG RE SUPP: RECTAL | Status: DC

## 2015-11-15 MED ORDER — HYDROCORTISONE 2.5 % RE CREA: TOPICAL_CREAM | RECTAL | Status: DC

## 2015-11-15 NOTE — Progress Notes (Signed)
Location:  Occupational psychologist of Service:  Clinic (12)  Provider: Ermie Glendenning L. Mariea Clonts, D.O., C.M.D.  Code Status: DNR Goals of Care:  Advanced Directives 11/15/2015  Does patient have an advance directive? Yes  Type of Paramedic of [place];Living will;Out of facility DNR (pink MOST or yellow form)  Copy of advanced directive(s) in chart? Yes  Pre-existing out of facility DNR order (yellow form or pink MOST form) Yellow form placed in chart (order not valid for inpatient use);Pink MOST form placed in chart (order not valid for inpatient use)   Chief Complaint  Patient presents with  . Medical Management of Chronic Issues    3 mth follow-up    HPI: Patient is a 82 y.o. female seen today for medical management of chronic diseases.    Drinks a lot of water and has to regulate her urinary frequency.  Has a nurse in the evening.  Takes a lot of water at once and then urinates every 30 mins.    Bowels are hard.  Had too much of something that made her go, then had a liquid BM and didn't make it to restroom in time.  Thought that bowels were coming through the front end, but it wasn't.  Sometimes can't urinate if she's not regular.  Once she overdid the fiber.  Newest thing is eating spinach and hummus which makes her more regular.  Sometimes bowel holds a lot before it empties and does not always empty fully.  Always thinking about bathroom things.  Sometimes has to get up to urinate 2-3x.  Sometimes no actual urination.  Takes uribel to prevent infections.  Mouth so dry, too.  Says feet stick to the floor like paste and eyes are dry.  Hs had two shots in her eyes.  Had wet macular in right eye--did well.    Helped a little bit.  Might get 3rd one.  Some of the black spots have cleared up, but still cannot read with it.  Did not have any bleeding problems.    Was having vaginal bleeding monthly for a while, but none for a while.  Once in a while gets  a little blood but she's unsure if it's hemorrhoidal or vaginal.  Nurse has never seen blood.  She follows with gynecology.  Overall, she does not want any major procedures or interventions "at her age".   Past Medical History  Diagnosis Date  . 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 Osteoarthrosis, unspecified whether generalized or localized, pelvic region and thigh   . Full incontinence of feces   . Asymptomatic varicose veins   . Anemia, unspecified   . Idiopathic osteoporosis   . Melanoma of skin, site unspecified   . Irritable bowel syndrome   . Internal hemorrhoids without mention of complication     bleed easily  . Carotid bruit     hx of  . Peripheral neuropathy (HCC)     hands/ feet  . HTN (hypertension)   . Esophageal reflux   . Diverticulosis   . Dyslipidemia   . Spinal stenosis, unspecified region other than cervical   . Hypothyroidism   . History of colon cancer     Adenocarcinoma of right colon, stage T2, N1.s/p resection/chemotherapy 2002  . MVP (mitral valve prolapse)   . Female bladder prolapse, acquired 07/17/2010  . Vaginal bleeding, abnormal   . Varicose veins   . Idiopathic osteoporosis   . Vaginitis, atrophic   .  Fibroid   . Carotid bruit     doppler normal in the past  . Hemorrhoid     1963 surgical excision  . OA (osteoarthritis)     left hip  . Hx of colonic polyps     1965 surgical excision    Past Surgical History  Procedure Laterality Date  . Hemicolectomy  2002    right  . Colonoscopy w/ polypectomy  2006    3 mm polyp destroyed, diverticulosis  . Esophagogastroduodenoscopy  2009    mild gastritis  . Hemorrhoid surgery  1965  . Hysteroscopy    . Dilation and curettage of uterus    . Melanoma excision      Allergies  Allergen Reactions  . Amoxil [Amoxicillin] Diarrhea  . Crab [Shellfish Allergy]     sensitivity  . Demerol   . Meperidine Hcl Other (See Comments)    Drop in blood pressure  . Neosporin [Neomycin-Bacitracin  Zn-Polymyx]   . Infed [Iron Dextran] Rash    Became very red over face, scalp, and arms      Medication List       This list is accurate as of: 11/15/15  3:06 PM.  Always use your most recent med list.               ALIGN 4 MG Caps  Take 1 tablet by mouth daily.     ALPRAZolam 0.25 MG tablet  Commonly known as:  XANAX  TAKE 1 TABLET EVERY SIX HOURS AS NEEDED FOR ANXIETY.     antiseptic oral rinse Liqd  15 mLs by Mouth Rinse route as needed for dry mouth.     ANUCORT-HC 25 MG suppository  Generic drug:  hydrocortisone  INSERT 1 SUPPOSITORY RECTALLY AS NEEDED.     cholecalciferol 1000 units tablet  Commonly known as:  VITAMIN D  Take 1 tablet (1,000 Units total) by mouth daily.     CITRUCEL FIBERSHAKE oral powder  Generic drug:  methylcellulose  Take 1 packet by mouth daily.     fluticasone 50 MCG/ACT nasal spray  Commonly known as:  FLONASE  ONE SPRAY IN EACH NOSTRIL ONCE DAILY DURING ALLERGY SEASON.     hydrocortisone valerate ointment 0.2 %  Commonly known as:  WEST-CORT  APPLY TWICE DAILY X1 WK. THEN, APPLY VASELINE TWICE DAILY TO AREA. MAY USE THIS CREAM ONCE/WEEK     levothyroxine 75 MCG tablet  Commonly known as:  SYNTHROID, LEVOTHROID  TAKE 1 TABLET DAILY BEFORE BREAKFAST FOR THYROID.     PROCTOSOL HC 2.5 % rectal cream  Generic drug:  hydrocortisone  APPLY RECTALLY TWICE DAILY AS NEEDED.     ranitidine 150 MG tablet  Commonly known as:  ZANTAC  Take 150 mg by mouth 2 (two) times daily.     Simethicone 250 MG Caps  Commonly known as:  PHAZYME MAXIMUM STRENGTH  Take 1 capsule by mouth daily as needed (gas).     TUMS 500 MG chewable tablet  Generic drug:  calcium carbonate  Chew 1 tablet by mouth daily as needed for indigestion (indigestion).     URIBEL PO  Take 1 capsule by mouth 2 (two) times daily.        Review of Systems:  Review of Systems  Constitutional: Negative for fever, chills and malaise/fatigue.  HENT: Positive for hearing  loss. Negative for congestion.   Eyes:       Dry eyes, glasses  Respiratory: Negative for cough and shortness of breath.  Cardiovascular: Negative for chest pain, palpitations and leg swelling.  Gastrointestinal: Positive for constipation and blood in stool. Negative for heartburn, nausea, vomiting, abdominal pain, diarrhea and melena.       Flatus  Genitourinary: Positive for urgency and frequency. Negative for dysuria, hematuria and flank pain.  Musculoskeletal: Negative for falls.       Walks with walker for support  Skin: Negative for rash.  Neurological: Negative for dizziness, loss of consciousness and weakness.  Psychiatric/Behavioral: Negative for depression and memory loss.    Health Maintenance  Topic Date Due  . ZOSTAVAX  03/10/1979  . INFLUENZA VACCINE  01/16/2016  . TETANUS/TDAP  10/08/2021  . DEXA SCAN  Completed  . PNA vac Low Risk Adult  Completed    Physical Exam: Filed Vitals:   11/15/15 1433  BP: 120/70  Pulse: 66  Temp: 98.6 F (37 C)  TempSrc: Oral  Height: 4\' 8"  (1.422 m)  Weight: 97 lb 8 oz (44.226 kg)  SpO2: 98%   Body mass index is 21.87 kg/(m^2). Physical Exam  Constitutional: She is oriented to person, place, and time. She appears well-developed and well-nourished. No distress.  Thin white female  Cardiovascular: Normal rate, regular rhythm, normal heart sounds and intact distal pulses.   Pulmonary/Chest: Effort normal and breath sounds normal.  Abdominal: Soft. Bowel sounds are normal. She exhibits mass. She exhibits no distension. There is no tenderness. There is no rebound and no guarding.  Musculoskeletal: Normal range of motion.  Walks with rolling walker  Neurological: She is alert and oriented to person, place, and time.  Skin: Skin is warm and dry.  Psychiatric: She has a normal mood and affect.    Labs reviewed: Basic Metabolic Panel:  Recent Labs  04/25/15 11/07/15 0700  NA 140 142  K 4.3 4.0  BUN 16 11  CREATININE 0.7  0.5  TSH 1.18  --    Liver Function Tests:  Recent Labs  04/25/15  AST 27  ALT 14  ALKPHOS 97   No results for input(s): LIPASE, AMYLASE in the last 8760 hours. No results for input(s): AMMONIA in the last 8760 hours. CBC:  Recent Labs  04/25/15 11/07/15 0700  WBC 5.2 4.8  HGB 12.5 12.6  HCT 35* 36  PLT 205 203   Lipid Panel:  Recent Labs  11/07/15 0700  CHOL 174  HDL 79*  LDLCALC 78  TRIG 87   Lab Results  Component Value Date   HGBA1C 5.9 12/02/2006   Assessment/Plan 1. Hemorrhoids, unspecified hemorrhoid type - continue to try to keep stools soft enough to prevent straining and pain  - eats plenty of fiber and hydrates, does quite well considering - also uses suppositories if she has any bleeding - hydrocortisone (PROCTOSOL HC) 2.5 % rectal cream; APPLY RECTALLY TWICE DAILY AS NEEDED.  Dispense: 28.35 g; Refill: 11  2. Urinary retention -continues her catheterization for this bid with relief, no changes here  3. Wet senile macular retinal degeneration ([place]) -continue drops per ophtho, glasses, injections that have helped  4. Chronic constipation -cont fiber cereal, citrucel, hummus, probiotic, and plenty of water, simethicone for flatus, hydrocortisone for hemorrhoids  5.  Hypothyroidism:  tsh was just normal, cont same synthroid  6.  Senile osteoporosis:  Cont ca w/ D and additional D, weightbearing exercise, healthy balanced diet; off meds at present, may need to restart--last bone density 2013--I think she may have said no to this before, but will need to check on it and  shingles vaccine next visit--she is 89!  Labs/tests ordered:  No new, labs reviewed and stable Next appt:  Visit date not found  Dysen Edmondson L. Wallice Granville, D.O. [facility] [address], [place] [address] Cell Phone (Mon-Fri 8am-5pm):  [phone] On Call:  [phone] & follow prompts after 5pm & weekends Office Phone:   [phone] Office Fax:  [phone]

## 2015-11-23 DIAGNOSIS — H353211 Exudative age-related macular degeneration, right eye, with active choroidal neovascularization: Secondary | ICD-10-CM | POA: Diagnosis not present

## 2015-11-27 DIAGNOSIS — R339 Retention of urine, unspecified: Secondary | ICD-10-CM | POA: Diagnosis not present

## 2015-12-01 ENCOUNTER — Emergency Department (HOSPITAL_COMMUNITY): Payer: Medicare Other

## 2015-12-01 ENCOUNTER — Emergency Department (HOSPITAL_COMMUNITY)
Admission: EM | Admit: 2015-12-01 | Discharge: 2015-12-02 | Disposition: A | Discharge status: Home / Self Care | Payer: Medicare Other | Attending: Emergency Medicine | Admitting: Emergency Medicine

## 2015-12-01 ENCOUNTER — Encounter (HOSPITAL_COMMUNITY): Payer: Self-pay | Admitting: *Deleted

## 2015-12-01 DIAGNOSIS — Y999 Unspecified external cause status: Secondary | ICD-10-CM | POA: Insufficient documentation

## 2015-12-01 DIAGNOSIS — I10 Essential (primary) hypertension: Secondary | ICD-10-CM | POA: Diagnosis not present

## 2015-12-01 DIAGNOSIS — Y929 Unspecified place or not applicable: Secondary | ICD-10-CM | POA: Diagnosis not present

## 2015-12-01 DIAGNOSIS — E039 Hypothyroidism, unspecified: Secondary | ICD-10-CM | POA: Diagnosis not present

## 2015-12-01 DIAGNOSIS — W1839XA Other fall on same level, initial encounter: Secondary | ICD-10-CM | POA: Diagnosis not present

## 2015-12-01 DIAGNOSIS — Z79899 Other long term (current) drug therapy: Secondary | ICD-10-CM | POA: Diagnosis not present

## 2015-12-01 DIAGNOSIS — N390 Urinary tract infection, site not specified: Secondary | ICD-10-CM | POA: Diagnosis not present

## 2015-12-01 DIAGNOSIS — R300 Dysuria: Secondary | ICD-10-CM | POA: Diagnosis present

## 2015-12-01 DIAGNOSIS — Z87891 Personal history of nicotine dependence: Secondary | ICD-10-CM | POA: Diagnosis not present

## 2015-12-01 DIAGNOSIS — B965 Pseudomonas (aeruginosa) (mallei) (pseudomallei) as the cause of diseases classified elsewhere: Secondary | ICD-10-CM | POA: Diagnosis not present

## 2015-12-01 DIAGNOSIS — E785 Hyperlipidemia, unspecified: Secondary | ICD-10-CM | POA: Diagnosis not present

## 2015-12-01 DIAGNOSIS — Z043 Encounter for examination and observation following other accident: Secondary | ICD-10-CM | POA: Diagnosis not present

## 2015-12-01 DIAGNOSIS — Y9301 Activity, walking, marching and hiking: Secondary | ICD-10-CM | POA: Insufficient documentation

## 2015-12-01 DIAGNOSIS — E871 Hypo-osmolality and hyponatremia: Secondary | ICD-10-CM | POA: Diagnosis not present

## 2015-12-01 DIAGNOSIS — R509 Fever, unspecified: Secondary | ICD-10-CM | POA: Diagnosis not present

## 2015-12-01 DIAGNOSIS — M1990 Unspecified osteoarthritis, unspecified site: Secondary | ICD-10-CM | POA: Insufficient documentation

## 2015-12-01 LAB — URINALYSIS, ROUTINE W REFLEX MICROSCOPIC
Bilirubin Urine: NEGATIVE
Glucose, UA: NEGATIVE mg/dL
KETONES UR: NEGATIVE mg/dL
NITRITE: NEGATIVE
PH: 7 (ref 5.0–8.0)
Protein, ur: 30 mg/dL — AB
Specific Gravity, Urine: 1.01 (ref 1.005–1.030)

## 2015-12-01 LAB — CBC WITH DIFFERENTIAL/PLATELET
Basophils Absolute: 0 10*3/uL (ref 0.0–0.1)
Basophils Relative: 0 %
EOS ABS: 0 10*3/uL (ref 0.0–0.7)
EOS PCT: 0 %
HCT: 30.6 % — ABNORMAL LOW (ref 36.0–46.0)
Hemoglobin: 10.5 g/dL — ABNORMAL LOW (ref 12.0–15.0)
LYMPHS ABS: 1 10*3/uL (ref 0.7–4.0)
Lymphocytes Relative: 9 %
MCH: 30.7 pg (ref 26.0–34.0)
MCHC: 34.3 g/dL (ref 30.0–36.0)
MCV: 89.5 fL (ref 78.0–100.0)
MONOS PCT: 3 %
Monocytes Absolute: 0.3 10*3/uL (ref 0.1–1.0)
Neutro Abs: 10.5 10*3/uL — ABNORMAL HIGH (ref 1.7–7.7)
Neutrophils Relative %: 88 %
PLATELETS: 181 10*3/uL (ref 150–400)
RBC: 3.42 MIL/uL — ABNORMAL LOW (ref 3.87–5.11)
RDW: 13.6 % (ref 11.5–15.5)
WBC: 11.8 10*3/uL — ABNORMAL HIGH (ref 4.0–10.5)

## 2015-12-01 LAB — COMPREHENSIVE METABOLIC PANEL
ALT: 21 U/L (ref 14–54)
ANION GAP: 11 (ref 5–15)
AST: 37 U/L (ref 15–41)
Albumin: 3.3 g/dL — ABNORMAL LOW (ref 3.5–5.0)
Alkaline Phosphatase: 75 U/L (ref 38–126)
BUN: 12 mg/dL (ref 6–20)
CALCIUM: 8.3 mg/dL — AB (ref 8.9–10.3)
CHLORIDE: 90 mmol/L — AB (ref 101–111)
CO2: 24 mmol/L (ref 22–32)
Creatinine, Ser: 0.51 mg/dL (ref 0.44–1.00)
GFR calc non Af Amer: >60 mL/min (ref >60)
Glucose, Bld: 127 mg/dL — ABNORMAL HIGH (ref 65–99)
Potassium: 3.2 mmol/L — ABNORMAL LOW (ref 3.5–5.1)
SODIUM: 125 mmol/L — AB (ref 135–145)
Total Bilirubin: 1.4 mg/dL — ABNORMAL HIGH (ref 0.3–1.2)
Total Protein: 6.6 g/dL (ref 6.5–8.1)

## 2015-12-01 LAB — URINE MICROSCOPIC-ADD ON

## 2015-12-01 MED ORDER — CEPHALEXIN 250 MG PO CAPS: 250.0000 mg | ORAL_CAPSULE | Freq: Four times a day (QID) | ORAL | Status: DC

## 2015-12-01 MED ORDER — DEXTROSE 5 % IV SOLN
1.0000 g | Freq: Once | INTRAVENOUS | Status: AC
  Administered 2015-12-01: 1 g via INTRAVENOUS
  Filled 2015-12-01: qty 10

## 2015-12-01 MED ORDER — SODIUM CHLORIDE 0.9 % IV BOLUS (SEPSIS)
500.0000 mL | Freq: Once | INTRAVENOUS | Status: AC
  Administered 2015-12-01: 500 mL via INTRAVENOUS

## 2015-12-01 MED ORDER — SODIUM CHLORIDE 0.9 % IV BOLUS (SEPSIS): 1000.0000 mL | Freq: Once | INTRAVENOUS | Status: DC

## 2015-12-01 MED ORDER — ACETAMINOPHEN 325 MG PO TABS
650.0000 mg | ORAL_TABLET | Freq: Once | ORAL | Status: AC
  Administered 2015-12-01: 650 mg via ORAL
  Filled 2015-12-01: qty 2

## 2015-12-01 NOTE — ED Notes (Signed)
Per EMS, pt fell today while walking from her bed to the bathroom. Staff took pt's vitals, which showed fever of 100.1. Pt denies pain.

## 2015-12-01 NOTE — ED Notes (Signed)
Awaiting for PTAR. 

## 2015-12-01 NOTE — ED Notes (Signed)
Bed: WA10 Expected date: 12/01/15 Expected time: 6:42 PM Means of arrival:  Comments: Fall from nsg home, no injury has fever 101.4

## 2015-12-01 NOTE — ED Provider Notes (Addendum)
CSN: [id number]     Arrival date & time 12/01/15  1853 History   First MD Initiated Contact with Patient 12/01/15 1859     Chief Complaint  Patient presents with  . Fall  . Fever     (Consider location/radiation/quality/duration/timing/severity/associated sxs/prior Treatment) HPI Comments: Patient is a 82 year old female with a history of hypertension, colon cancer, anemia, balance issues who uses a walker and presents today with EMS after a fall at home, fever and urinary incontinence. Patient states for the last week she felt that she may have urinary issues. They had sent a UA that she had not taken any antibiotics. Today she was constipated so she took some milk of magnesia. When she went to get the phone she had the urge to go to the bathroom and when she attempted to get up she slid to the floor and was unable to stand causing her to defecate and urinate on herself. She denies hitting her head or loss of consciousness. She has no pain anywhere. She did have a temperature of 101.4 today which is new. She denies nausea or vomiting and has been eating normally  Patient is a 82 y.o. female presenting with fall and fever. The history is provided by the patient.  Fall This is a new problem. The current episode started 1 to 2 hours ago. The problem occurs constantly. The problem has been resolved. Associated symptoms comments: diffuculty urinating and unable to get up.  Pt states has been having some urinary issues lately and recently cathed to have UA sent but not started any antibiotics.  Fever today of 101.3. Nothing aggravates the symptoms. Nothing relieves the symptoms.  Fever   Past Medical History  Diagnosis Date  . 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 Osteoarthrosis, unspecified whether generalized or localized, pelvic region and thigh   . Full incontinence of feces   . Asymptomatic varicose veins   . Anemia, unspecified   . Idiopathic osteoporosis   . Melanoma of skin, site  unspecified   . Irritable bowel syndrome   . Internal hemorrhoids without mention of complication     bleed easily  . Carotid bruit     hx of  . Peripheral neuropathy (HCC)     hands/ feet  . HTN (hypertension)   . Esophageal reflux   . Diverticulosis   . Dyslipidemia   . Spinal stenosis, unspecified region other than cervical   . Hypothyroidism   . History of colon cancer     Adenocarcinoma of right colon, stage T2, N1.s/p resection/chemotherapy 2002  . MVP (mitral valve prolapse)   . Female bladder prolapse, acquired 07/17/2010  . Vaginal bleeding, abnormal   . Varicose veins   . Idiopathic osteoporosis   . Vaginitis, atrophic   . Fibroid   . Carotid bruit     doppler normal in the past  . Hemorrhoid     1963 surgical excision  . OA (osteoarthritis)     left hip  . Hx of colonic polyps     1965 surgical excision   Past Surgical History  Procedure Laterality Date  . Hemicolectomy  2002    right  . Colonoscopy w/ polypectomy  2006    3 mm polyp destroyed, diverticulosis  . Esophagogastroduodenoscopy  2009    mild gastritis  . Hemorrhoid surgery  1965  . Hysteroscopy    . Dilation and curettage of uterus    . Melanoma excision     Family History  Problem Relation Age  of Onset  . Stomach cancer Maternal Grandfather   . Colon cancer Neg Hx   . Heart disease Father   . Diabetes Father   . Heart disease Sister    Social History  Substance Use Topics  . Smoking status: Former Smoker -- 2 years    Types: Cigarettes    Quit date: 09/09/1943  . Smokeless tobacco: Never Used  . Alcohol Use: 0.0 oz/week    0 Standard drinks or equivalent per week     Comment: occas wine 2 glasses a week   OB History    Gravida Para Term Preterm AB TAB SAB Ectopic Multiple Living   2 2 2       2      Review of Systems  Constitutional: Positive for fever.  All other systems reviewed and are negative.     Allergies  Amoxil; Crab; Demerol; Meperidine hcl; Neosporin; and  Infed  Home Medications   Prior to Admission medications   Medication Sig Start Date End Date Taking? Authorizing Provider  ALPRAZolam (XANAX) 0.25 MG tablet TAKE 1 TABLET EVERY SIX HOURS AS NEEDED FOR ANXIETY. 11/14/15   Estill Dooms, MD  antiseptic oral rinse (BIOTENE) LIQD 15 mLs by Mouth Rinse route as needed for dry mouth. 06/04/13   Neena Rhymes, MD  calcium carbonate (TUMS) 500 MG chewable tablet Chew 1 tablet by mouth daily as needed for indigestion (indigestion).     Historical Provider, MD  cholecalciferol (VITAMIN D) 1000 UNITS tablet Take 1 tablet (1,000 Units total) by mouth daily. 05/03/15   Tiffany L Reed, DO  fluticasone (FLONASE) 50 MCG/ACT nasal spray ONE SPRAY IN EACH NOSTRIL ONCE DAILY DURING ALLERGY SEASON. 06/23/15   Tiffany L Reed, DO  hydrocortisone (ANUCORT-HC) 25 MG suppository INSERT 1 SUPPOSITORY RECTALLY AS NEEDED. 11/15/15   Tiffany L Reed, DO  hydrocortisone (PROCTOSOL HC) 2.5 % rectal cream APPLY RECTALLY TWICE DAILY AS NEEDED. 11/15/15   Tiffany L Reed, DO  hydrocortisone valerate ointment (WEST-CORT) 0.2 % APPLY TWICE DAILY X1 WK. THEN, APPLY VASELINE TWICE DAILY TO AREA. MAY USE THIS CREAM ONCE/WEEK 07/24/15   Terrance Mass, MD  levothyroxine (SYNTHROID, LEVOTHROID) 75 MCG tablet TAKE 1 TABLET DAILY BEFORE BREAKFAST FOR THYROID. 11/14/15   Estill Dooms, MD  Meth-Hyo-M Barnett Hatter Phos-Ph Sal (URIBEL PO) Take 1 capsule by mouth 2 (two) times daily.     Historical Provider, MD  methylcellulose (CITRUCEL FIBERSHAKE) oral powder Take 1 packet by mouth daily.    Historical Provider, MD  Probiotic Product (ALIGN) 4 MG CAPS Take 1 tablet by mouth daily.      Historical Provider, MD  ranitidine (ZANTAC) 150 MG tablet Take 150 mg by mouth 2 (two) times daily.    Historical Provider, MD  Simethicone (PHAZYME MAXIMUM STRENGTH) 250 MG CAPS Take 1 capsule by mouth daily as needed (gas). 09/08/15   Tiffany L Reed, DO   BP 114/55 mmHg  Pulse 83  Temp(Src) 100.3 F (37.9 C)  (Rectal)  Resp 18  SpO2 94% Physical Exam  Constitutional: She is oriented to person, place, and time. She appears well-developed and well-nourished. No distress.  HENT:  Head: Normocephalic and atraumatic.  Mouth/Throat: Oropharynx is clear and moist. Mucous membranes are dry.  Eyes: Conjunctivae and EOM are normal. Pupils are equal, round, and reactive to light.  Neck: Normal range of motion. Neck supple.  Cardiovascular: Normal rate, regular rhythm and intact distal pulses.   Murmur heard.  Systolic murmur is present with  a grade of 4/6  Pulmonary/Chest: Effort normal and breath sounds normal. No respiratory distress. She has no wheezes. She has no rales.  Abdominal: Soft. She exhibits no distension. There is no tenderness. There is no rebound and no guarding.  Musculoskeletal: Normal range of motion. She exhibits no edema or tenderness.  Neurological: She is alert and oriented to person, place, and time.  Skin: Skin is warm and dry. No rash noted. No erythema.  Psychiatric: She has a normal mood and affect. Her behavior is normal.  Nursing note and vitals reviewed.   ED Course  Procedures (including critical care time) Labs Review Labs Reviewed  CBC WITH DIFFERENTIAL/PLATELET - Abnormal; Notable for the following:    WBC 11.8 (*)    RBC 3.42 (*)    Hemoglobin 10.5 (*)    HCT 30.6 (*)    Neutro Abs 10.5 (*)    All other components within normal limits  COMPREHENSIVE METABOLIC PANEL - Abnormal; Notable for the following:    Sodium 125 (*)    Potassium 3.2 (*)    Chloride 90 (*)    Glucose, Bld 127 (*)    Calcium 8.3 (*)    Albumin 3.3 (*)    Total Bilirubin 1.4 (*)    All other components within normal limits  URINALYSIS, ROUTINE W REFLEX MICROSCOPIC (NOT AT [facility]) - Abnormal; Notable for the following:    Color, Urine GREEN (*)    APPearance CLOUDY (*)    Hgb urine dipstick LARGE (*)    Protein, ur 30 (*)    Leukocytes, UA LARGE (*)    All other components within  normal limits  URINE MICROSCOPIC-ADD ON - Abnormal; Notable for the following:    Squamous Epithelial / LPF 6-30 (*)    Bacteria, UA MANY (*)    All other components within normal limits  URINE CULTURE    Imaging Review No results found. I have personally reviewed and evaluated these images and lab results as part of my medical decision-making.   EKG Interpretation None      MDM   Final diagnoses:  UTI (lower urinary tract infection)  Hyponatremia    Patient is an elderly female who lives at independent living presenting today after a fever and a fall to the floor where she was unable to get up. Patient was initially incontinent of stool in urine but she had taken a laxative earlier and states she could make it to the bathroom. She has had urinary issues for the last week and states he urine have been sent earlier this week but she had not heard the results are been started on antibiotic. She had a fever today of 101.4 but she currently denies any complaints. She has no suprapubic or back pain. No nausea or vomiting. She denies a shortness of breath or cough.  Patient denies any head injury or loss of consciousness. She can recall the entire event and states she slipped out of the chair to the floor. She ambulates with a walker which she is supposed to use all the time but does not. She is well-appearing on exam has normal vital signs except for a low-grade temperature of 100.3.  CBC with a mild leukocytosis of 11,000, CMP with evidence of hyponatremia and normal creatinine.  Patient is on no occasion that will cause hyponatremia. Discussed with her eating more salt. She was given normal saline and Rocephin for signs of UTI with too numerous to count white blood cells on her  UA.  10:26 PM Pt able to ambulate here without diffuclty and returning [facility] where she will having constant supervison for the next 24 hrs.  Blanchie Dessert, MD 12/01/15 2227  Blanchie Dessert,  MD 12/01/15 2229

## 2015-12-02 DIAGNOSIS — R259 Unspecified abnormal involuntary movements: Secondary | ICD-10-CM | POA: Diagnosis not present

## 2015-12-02 DIAGNOSIS — R2689 Other abnormalities of gait and mobility: Secondary | ICD-10-CM | POA: Diagnosis not present

## 2015-12-04 DIAGNOSIS — R278 Other lack of coordination: Secondary | ICD-10-CM | POA: Diagnosis not present

## 2015-12-04 DIAGNOSIS — N3946 Mixed incontinence: Secondary | ICD-10-CM | POA: Diagnosis not present

## 2015-12-04 DIAGNOSIS — A5409 Other gonococcal infection of lower genitourinary tract: Secondary | ICD-10-CM | POA: Diagnosis not present

## 2015-12-04 DIAGNOSIS — M6281 Muscle weakness (generalized): Secondary | ICD-10-CM | POA: Diagnosis not present

## 2015-12-04 DIAGNOSIS — E871 Hypo-osmolality and hyponatremia: Secondary | ICD-10-CM | POA: Diagnosis not present

## 2015-12-04 DIAGNOSIS — R296 Repeated falls: Secondary | ICD-10-CM | POA: Diagnosis not present

## 2015-12-04 DIAGNOSIS — N39498 Other specified urinary incontinence: Secondary | ICD-10-CM | POA: Diagnosis not present

## 2015-12-04 LAB — URINE CULTURE
Culture: >=100000 — AB
SPECIAL REQUESTS: NORMAL

## 2015-12-05 ENCOUNTER — Telehealth (HOSPITAL_BASED_OUTPATIENT_CLINIC_OR_DEPARTMENT_OTHER): Payer: Self-pay | Admitting: Emergency Medicine

## 2015-12-05 DIAGNOSIS — A5409 Other gonococcal infection of lower genitourinary tract: Secondary | ICD-10-CM | POA: Diagnosis not present

## 2015-12-05 DIAGNOSIS — R278 Other lack of coordination: Secondary | ICD-10-CM | POA: Diagnosis not present

## 2015-12-05 DIAGNOSIS — N3946 Mixed incontinence: Secondary | ICD-10-CM | POA: Diagnosis not present

## 2015-12-05 DIAGNOSIS — N39498 Other specified urinary incontinence: Secondary | ICD-10-CM | POA: Diagnosis not present

## 2015-12-05 DIAGNOSIS — M6281 Muscle weakness (generalized): Secondary | ICD-10-CM | POA: Diagnosis not present

## 2015-12-05 DIAGNOSIS — E871 Hypo-osmolality and hyponatremia: Secondary | ICD-10-CM | POA: Diagnosis not present

## 2015-12-05 NOTE — ED Provider Notes (Signed)
Was contacted by Jeani Hawking from patient placement. I had previously approved a recommendation by the pharmacist, Cassie, for fosfomycin for the treatment of the patient's UTI. Selection was made based on urine culture results testing positive for pseudomonas. Patient was originally prescribed Keflex, but this would be inappropriate for pseudomonas coverage.  Jeani Hawking advises me that the patient's insurance requires prior authorization of this medication. Asked to call in the authorization to [id number]. This prior authorization was called in. In the meantime, I spoke with the [facility] where the patient gets her prescriptions filled and was told that the patient has already picked up the prescription for fosfomycin and paid out of pocket.   Results for orders placed or performed during the hospital encounter of 12/01/15  Urine culture  Result Value Ref Range   Specimen Description URINE, CLEAN CATCH    Special Requests Normal    Culture >=100,000 COLONIES/mL PSEUDOMONAS AERUGINOSA (A)    Report Status 12/04/2015 FINAL    Organism ID, Bacteria PSEUDOMONAS AERUGINOSA (A)       Susceptibility   Pseudomonas aeruginosa - MIC*    CEFTAZIDIME 2 SENSITIVE Sensitive     CIPROFLOXACIN <=0.25 SENSITIVE Sensitive     GENTAMICIN 2 SENSITIVE Sensitive     IMIPENEM 1 SENSITIVE Sensitive     PIP/TAZO 8 SENSITIVE Sensitive     CEFEPIME <=1 SENSITIVE Sensitive     * >=100,000 COLONIES/mL PSEUDOMONAS AERUGINOSA  CBC with Differential/Platelet  Result Value Ref Range   WBC 11.8 (H) 4.0 - 10.5 K/uL   RBC 3.42 (L) 3.87 - 5.11 MIL/uL   Hemoglobin 10.5 (L) 12.0 - 15.0 g/dL   HCT 30.6 (L) 36.0 - 46.0 %   MCV 89.5 78.0 - 100.0 fL   MCH 30.7 26.0 - 34.0 pg   MCHC 34.3 30.0 - 36.0 g/dL   RDW 13.6 11.5 - 15.5 %   Platelets 181 150 - 400 K/uL   Neutrophils Relative % 88 %   Neutro Abs 10.5 (H) 1.7 - 7.7 K/uL   Lymphocytes Relative 9 %   Lymphs Abs 1.0 0.7 - 4.0 K/uL   Monocytes Relative 3 %   Monocytes  Absolute 0.3 0.1 - 1.0 K/uL   Eosinophils Relative 0 %   Eosinophils Absolute 0.0 0.0 - 0.7 K/uL   Basophils Relative 0 %   Basophils Absolute 0.0 0.0 - 0.1 K/uL  Comprehensive metabolic panel  Result Value Ref Range   Sodium 125 (L) 135 - 145 mmol/L   Potassium 3.2 (L) 3.5 - 5.1 mmol/L   Chloride 90 (L) 101 - 111 mmol/L   CO2 24 22 - 32 mmol/L   Glucose, Bld 127 (H) 65 - 99 mg/dL   BUN 12 6 - 20 mg/dL   Creatinine, Ser 0.51 0.44 - 1.00 mg/dL   Calcium 8.3 (L) 8.9 - 10.3 mg/dL   Total Protein 6.6 6.5 - 8.1 g/dL   Albumin 3.3 (L) 3.5 - 5.0 g/dL   AST 37 15 - 41 U/L   ALT 21 14 - 54 U/L   Alkaline Phosphatase 75 38 - 126 U/L   Total Bilirubin 1.4 (H) 0.3 - 1.2 mg/dL   GFR calc non Af Amer >60 >60 mL/min   GFR calc Af Amer >60 >60 mL/min   Anion gap 11 5 - 15  Urinalysis, Routine w reflex microscopic (not at [facility])  Result Value Ref Range   Color, Urine GREEN (A) YELLOW   APPearance CLOUDY (A) CLEAR   Specific Gravity, Urine 1.010  1.005 - 1.030   pH 7.0 5.0 - 8.0   Glucose, UA NEGATIVE NEGATIVE mg/dL   Hgb urine dipstick LARGE (A) NEGATIVE   Bilirubin Urine NEGATIVE NEGATIVE   Ketones, ur NEGATIVE NEGATIVE mg/dL   Protein, ur 30 (A) NEGATIVE mg/dL   Nitrite NEGATIVE NEGATIVE   Leukocytes, UA LARGE (A) NEGATIVE  Urine microscopic-add on  Result Value Ref Range   Squamous Epithelial / LPF 6-30 (A) NONE SEEN   WBC, UA TOO NUMEROUS TO COUNT 0 - 5 WBC/hpf   RBC / HPF TOO NUMEROUS TO COUNT 0 - 5 RBC/hpf   Bacteria, UA MANY (A) NONE SEEN   No results found.   Lorayne Bender, PA-C 12/05/15 1407  Isla Pence, MD 12/05/15 [phone]

## 2015-12-05 NOTE — Progress Notes (Signed)
ED Antimicrobial Stewardship Positive Culture Follow Up   [name] Small is an 82 y.o. female who presented to [facility] on 12/01/2015 with a chief complaint of  Chief Complaint  Patient presents with  . Fall  . Fever    Recent Results (from the past 720 hour(s))  Urine culture     Status: Abnormal   Collection Time: 12/01/15  7:27 PM  Result Value Ref Range Status   Specimen Description URINE, CLEAN CATCH  Final   Special Requests Normal  Final   Culture >=100,000 COLONIES/mL PSEUDOMONAS AERUGINOSA (A)  Final   Report Status 12/04/2015 FINAL  Final   Organism ID, Bacteria PSEUDOMONAS AERUGINOSA (A)  Final      Susceptibility   Pseudomonas aeruginosa - MIC*    CEFTAZIDIME 2 SENSITIVE Sensitive     CIPROFLOXACIN <=0.25 SENSITIVE Sensitive     GENTAMICIN 2 SENSITIVE Sensitive     IMIPENEM 1 SENSITIVE Sensitive     PIP/TAZO 8 SENSITIVE Sensitive     CEFEPIME <=1 SENSITIVE Sensitive     * >=100,000 COLONIES/mL PSEUDOMONAS AERUGINOSA    [x]  Treated with amoxicillin and cephalexin, organism resistant to prescribed antimicrobial  New antibiotic prescription: Stop amoxicillin and cephalexin. Fosfomycin 3 gm PO x 1.  ED Provider: Arlean Hopping, PA-C  Cassie L. Nicole Kindred, PharmD PGY2 Infectious Diseases Pharmacy Resident Pager: [phone] 12/05/2015 9:32 AM

## 2015-12-05 NOTE — Telephone Encounter (Signed)
Post ED Visit - Positive Culture Follow-up: Successful Patient Follow-Up  Culture assessed and recommendations reviewed by: []  Elenor Quinones, Pharm.D. []  Heide Guile, Pharm.D., BCPS []  Parks Neptune, Pharm.D. []  Alycia Rossetti, Pharm.D., BCPS []  [place], Pharm.D., BCPS, AAHIVP []  Legrand Como, Pharm.D., BCPS, AAHIVP [x]  Cassie Stewart, Pharm.D. []  Stephens November, Pharm.D.  Positive urine culture  []  Patient discharged without antimicrobial prescription and treatment is now indicated [x]  Organism is resistant to prescribed ED discharge antimicrobial []  Patient with positive blood cultures  Changes discussed with ED provider: Arlean Hopping PA New antibiotic prescription stop Amoxicillin and Cephalexin, start Fosfomycin 3 gram po x 1  Attempting to contact patient   Hazle Nordmann 12/05/2015, 11:17 AM

## 2015-12-06 DIAGNOSIS — N39498 Other specified urinary incontinence: Secondary | ICD-10-CM | POA: Diagnosis not present

## 2015-12-06 DIAGNOSIS — N3946 Mixed incontinence: Secondary | ICD-10-CM | POA: Diagnosis not present

## 2015-12-06 DIAGNOSIS — R278 Other lack of coordination: Secondary | ICD-10-CM | POA: Diagnosis not present

## 2015-12-06 DIAGNOSIS — M6281 Muscle weakness (generalized): Secondary | ICD-10-CM | POA: Diagnosis not present

## 2015-12-06 DIAGNOSIS — E871 Hypo-osmolality and hyponatremia: Secondary | ICD-10-CM | POA: Diagnosis not present

## 2015-12-06 DIAGNOSIS — A5409 Other gonococcal infection of lower genitourinary tract: Secondary | ICD-10-CM | POA: Diagnosis not present

## 2015-12-07 ENCOUNTER — Ambulatory Visit (INDEPENDENT_AMBULATORY_CARE_PROVIDER_SITE_OTHER): Payer: Medicare Other | Admitting: Internal Medicine

## 2015-12-07 ENCOUNTER — Encounter: Payer: Self-pay | Admitting: Internal Medicine

## 2015-12-07 VITALS — BP 120/70 | HR 77 | Temp 98.1°F | Wt 99.0 lb

## 2015-12-07 DIAGNOSIS — R278 Other lack of coordination: Secondary | ICD-10-CM | POA: Diagnosis not present

## 2015-12-07 DIAGNOSIS — N3000 Acute cystitis without hematuria: Secondary | ICD-10-CM

## 2015-12-07 DIAGNOSIS — N8110 Cystocele, unspecified: Secondary | ICD-10-CM

## 2015-12-07 DIAGNOSIS — N952 Postmenopausal atrophic vaginitis: Secondary | ICD-10-CM

## 2015-12-07 DIAGNOSIS — T3695XA Adverse effect of unspecified systemic antibiotic, initial encounter: Secondary | ICD-10-CM

## 2015-12-07 DIAGNOSIS — K5900 Constipation, unspecified: Secondary | ICD-10-CM | POA: Diagnosis not present

## 2015-12-07 DIAGNOSIS — E871 Hypo-osmolality and hyponatremia: Secondary | ICD-10-CM | POA: Diagnosis not present

## 2015-12-07 DIAGNOSIS — R339 Retention of urine, unspecified: Secondary | ICD-10-CM

## 2015-12-07 DIAGNOSIS — N3946 Mixed incontinence: Secondary | ICD-10-CM | POA: Diagnosis not present

## 2015-12-07 DIAGNOSIS — K529 Noninfective gastroenteritis and colitis, unspecified: Secondary | ICD-10-CM | POA: Diagnosis not present

## 2015-12-07 DIAGNOSIS — K521 Toxic gastroenteritis and colitis: Secondary | ICD-10-CM

## 2015-12-07 DIAGNOSIS — K5909 Other constipation: Secondary | ICD-10-CM

## 2015-12-07 DIAGNOSIS — A5409 Other gonococcal infection of lower genitourinary tract: Secondary | ICD-10-CM | POA: Diagnosis not present

## 2015-12-07 DIAGNOSIS — N39498 Other specified urinary incontinence: Secondary | ICD-10-CM | POA: Diagnosis not present

## 2015-12-07 DIAGNOSIS — M6281 Muscle weakness (generalized): Secondary | ICD-10-CM | POA: Diagnosis not present

## 2015-12-07 NOTE — Progress Notes (Signed)
Location:  [facility] clinic Provider: Lylianna Fraiser L. Mariea Clonts, D.O., C.M.D.  Code Status: DNR Goals of Care:  Advanced Directives 12/01/2015  Does patient have an advance directive? Yes  Type of Paramedic of [place];Living will;Out of facility DNR (pink MOST or yellow form)  Copy of advanced directive(s) in chart? Yes  Pre-existing out of facility DNR order (yellow form or pink MOST form) Yellow form placed in chart (order not valid for inpatient use);Pink MOST form placed in chart (order not valid for inpatient use)   Chief Complaint  Patient presents with  . Acute Visit    UTI, Diarrhea, discuss ED visit    HPI: Patient is a 82 y.o. Small seen today for an acute visit for f/u on UTI, problems with diarrhea and ED visit about her UTI.  Felt bad for 2 wks.  Had the fever of 102 and culture didn't come back until late.  Fell and didn't hurt herself, but couldn't get up.  Had not been wearing her life alert button but is now.  She was trying to go to the bathroom and tried to answer the phone, she had a mess on the floor and fell in it.    She brought a summary of the events that she typed.   She feels like she is about to fall apart.    Today's stool was formed, did have to hurry to get there. Also was regular yesterday.  She'd been having loose stools lately and she was afraid that was going to be an ongoing problem.  (Likely from the antibiotics).    Says she is having more difficulty remembering names.  Says she doesn't want to be around if she's much worse than she is.    She is feeling a little stronger.    I spoke with her nephew and he wanted [place] to recheck her urine to ensure infection cleared.  Past Medical History  Diagnosis Date  . 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 Osteoarthrosis, unspecified whether generalized or localized, pelvic region and thigh   . Full incontinence of feces   . Asymptomatic varicose veins   . Anemia, unspecified   . Idiopathic  osteoporosis   . Melanoma of skin, site unspecified   . Irritable bowel syndrome   . Internal hemorrhoids without mention of complication     bleed easily  . Carotid bruit     hx of  . Peripheral neuropathy (HCC)     hands/ feet  . HTN (hypertension)   . Esophageal reflux   . Diverticulosis   . Dyslipidemia   . Spinal stenosis, unspecified region other than cervical   . Hypothyroidism   . History of colon cancer     Adenocarcinoma of right colon, stage T2, N1.s/p resection/chemotherapy 2002  . MVP (mitral valve prolapse)   . Small bladder prolapse, acquired 07/17/2010  . Vaginal bleeding, abnormal   . Varicose veins   . Idiopathic osteoporosis   . Vaginitis, atrophic   . Fibroid   . Carotid bruit     doppler normal in the past  . Hemorrhoid     1963 surgical excision  . OA (osteoarthritis)     left hip  . Hx of colonic polyps     1965 surgical excision    Past Surgical History  Procedure Laterality Date  . Hemicolectomy  2002    right  . Colonoscopy w/ polypectomy  2006    3 mm polyp destroyed, diverticulosis  . Esophagogastroduodenoscopy  2009    mild gastritis  . Hemorrhoid surgery  1965  . Hysteroscopy    . Dilation and curettage of uterus    . Melanoma excision      Allergies  Allergen Reactions  . Amoxil [Amoxicillin] Diarrhea    Has patient had a PCN reaction causing immediate rash, facial/tongue/throat swelling, SOB or lightheadedness with hypotension: no Has patient had a PCN reaction causing severe rash involving mucus membranes or skin necrosis: no Has patient had a PCN reaction that required hospitalization : no Has patient had a PCN reaction occurring within the last 10 years: no If all of the above answers are "NO", then may proceed with Cephalosporin use.   Otho Darner Allergy]     sensitivity  . Demerol     unknown  . Meperidine Hcl Other (See Comments)    Drop in blood pressure  . Infed [Iron Dextran] Rash    Became very red over  face, scalp, and arms  . Neosporin [Neomycin-Bacitracin Zn-Polymyx] Rash    unknown      Medication List       This list is accurate as of: 12/07/15  4:20 PM.  Always use your most recent med list.               ALIGN 4 MG Caps  Take 1 tablet by mouth daily.     ALPRAZolam 0.25 MG tablet  Commonly known as:  XANAX  Take 0.25 mg by mouth at bedtime as needed for anxiety.     antiseptic oral rinse Liqd  15 mLs by Mouth Rinse route as needed for dry mouth.     cholecalciferol 1000 units tablet  Commonly known as:  VITAMIN D  Take 1 tablet (1,000 Units total) by mouth daily.     CITRUCEL FIBERSHAKE oral powder  Generic drug:  methylcellulose  Take 1 packet by mouth daily.     fluticasone 50 MCG/ACT nasal spray  Commonly known as:  FLONASE  Place 1 spray into both nostrils daily.     fosfomycin 3 g Pack  Commonly known as:  MONUROL  Take 3 g by mouth once.     hydrocortisone 2.5 % rectal cream  Commonly known as:  ANUSOL-HC  Place 1 application rectally as needed for hemorrhoids or itching.     hydrocortisone 25 MG suppository  Commonly known as:  ANUSOL-HC  Place 25 mg rectally 2 (two) times daily as needed for hemorrhoids or itching.     levothyroxine 75 MCG tablet  Commonly known as:  SYNTHROID, LEVOTHROID  TAKE 1 TABLET DAILY BEFORE BREAKFAST FOR THYROID.     ranitidine 150 MG tablet  Commonly known as:  ZANTAC  Take 150 mg by mouth 2 (two) times daily as needed for heartburn.     Simethicone 250 MG Caps  Commonly known as:  PHAZYME MAXIMUM STRENGTH  Take 1 capsule by mouth daily as needed (gas).     TUMS 500 MG chewable tablet  Generic drug:  calcium carbonate  Chew 1 tablet by mouth daily as needed for indigestion (indigestion).     URIBEL PO  Take 1 capsule by mouth daily.        Review of Systems:  Review of Systems  Constitutional: Positive for malaise/fatigue and diaphoresis. Negative for fever and chills.  HENT: Positive for hearing loss.  Negative for congestion.   Eyes: Negative for blurred vision.       Glasses  Respiratory: Negative for shortness of breath.  Cardiovascular: Negative for chest pain and leg swelling.  Gastrointestinal: Positive for diarrhea and constipation. Negative for abdominal pain.       Hemorrhoids  Genitourinary: Positive for urgency and frequency. Negative for dysuria, hematuria and flank pain.  Musculoskeletal: Negative for falls.  Skin: Negative for rash.  Neurological: Positive for weakness. Negative for dizziness and loss of consciousness.  Endo/Heme/Allergies: Bruises/bleeds easily.  Psychiatric/Behavioral: Negative for memory loss.    Health Maintenance  Topic Date Due  . ZOSTAVAX  03/10/1979  . INFLUENZA VACCINE  01/16/2016  . TETANUS/TDAP  10/08/2021  . DEXA SCAN  Completed  . PNA vac Low Risk Adult  Completed    Physical Exam: Filed Vitals:   12/07/15 1608  BP: 120/[name]  Pulse: 77  Temp: 98.1 F (36.7 C)  TempSrc: Oral  Weight: 99 lb (44.906 kg)  SpO2: 98%   Body mass index is 22.21 kg/(m^2). Physical Exam  Constitutional: She is oriented to person, place, and time. No distress.  Cardiovascular: Normal rate, regular rhythm, normal heart sounds and intact distal pulses.   Pulmonary/Chest: Effort normal and breath sounds normal. No respiratory distress.  Abdominal: Soft. Bowel sounds are normal. She exhibits no distension and no mass. There is no tenderness. There is no rebound and no guarding.  Genitourinary:  No suprapubic or CVA tenderness  Musculoskeletal: Normal range of motion.  Walks with walker  Neurological: She is alert and oriented to person, place, and time.  Skin: Skin is warm and dry. There is pallor.  Psychiatric: She has a normal mood and affect.    Labs reviewed: Basic Metabolic Panel:  Recent Labs  04/25/15 11/07/15 0700 12/01/15 1937  NA 140 142 125*  K 4.3 4.0 3.2*  CL  --   --  90*  CO2  --   --  24  GLUCOSE  --   --  127*  BUN 16 11 12     CREATININE 0.7 0.5 0.51  CALCIUM  --   --  8.3*  TSH 1.18  --   --    Liver Function Tests:  Recent Labs  04/25/15 12/01/15 1937  AST 27 37  ALT 14 21  ALKPHOS 97 75  BILITOT  --  1.4*  PROT  --  6.6  ALBUMIN  --  3.3*   No results for input(s): LIPASE, AMYLASE in the last 8760 hours. No results for input(s): AMMONIA in the last 8760 hours. CBC:  Recent Labs  04/25/15 11/07/15 0700 12/01/15 1937  WBC 5.2 4.8 11.8*  NEUTROABS  --   --  10.5*  HGB 12.5 12.6 10.5*  HCT 35* 36 30.6*  MCV  --   --  89.5  PLT 205 203 181   Lipid Panel:  Recent Labs  11/07/15 0700  CHOL 174  HDL 79*  LDLCALC 78  TRIG 87   Lab Results  Component Value Date   HGBA1C 5.9 12/02/2006    Assessment/Plan 1. Acute cystitis without hematuria -resolved after fosfomycin clinically -will repeat I/O cath sample next week to ensure clearance especially if sweats continue  2. Small bladder prolapse, acquired -ongoing problem for her and contributes to #3, gets worse when constipated so must keep this regular  3. Urinary retention -getting in and out caths by nursing in the evenings--cont this and regular hydration  4. VAGINITIS, ATROPHIC -contributes to her dysuria and irritation--maintain good hygiene and moisturize area  Labs/tests ordered:  Repeat UA c+s at [facility] via I/O cath Next appt:  02/14/2016 keep  Takita Riecke L. Deundre Thong, D.O. [facility] [address], [place] [address] Cell Phone (Mon-Fri 8am-5pm):  [phone] On Call:  [phone] & follow prompts after 5pm & weekends Office Phone:  [phone] Office Fax:  [phone]

## 2015-12-09 ENCOUNTER — Other Ambulatory Visit: Payer: Self-pay | Admitting: Internal Medicine

## 2015-12-11 ENCOUNTER — Telehealth: Payer: Self-pay | Admitting: *Deleted

## 2015-12-11 DIAGNOSIS — N3946 Mixed incontinence: Secondary | ICD-10-CM | POA: Diagnosis not present

## 2015-12-11 DIAGNOSIS — N39498 Other specified urinary incontinence: Secondary | ICD-10-CM | POA: Diagnosis not present

## 2015-12-11 DIAGNOSIS — R278 Other lack of coordination: Secondary | ICD-10-CM | POA: Diagnosis not present

## 2015-12-11 DIAGNOSIS — M6281 Muscle weakness (generalized): Secondary | ICD-10-CM | POA: Diagnosis not present

## 2015-12-11 DIAGNOSIS — E871 Hypo-osmolality and hyponatremia: Secondary | ICD-10-CM | POA: Diagnosis not present

## 2015-12-11 DIAGNOSIS — A5409 Other gonococcal infection of lower genitourinary tract: Secondary | ICD-10-CM | POA: Diagnosis not present

## 2015-12-11 NOTE — Telephone Encounter (Signed)
Patient called back and stated that she wakes up every night and she is sweating and has to change her gown. She stated that she takes her temperature and it is normal. Please Advise.

## 2015-12-11 NOTE — Telephone Encounter (Signed)
Patient called and left message on machine that her gown is soaked when she wakes up at night.  I tried calling patient back because I am not sure what it is soaked from. LMOM to return call.

## 2015-12-12 DIAGNOSIS — N3946 Mixed incontinence: Secondary | ICD-10-CM | POA: Diagnosis not present

## 2015-12-12 DIAGNOSIS — N39498 Other specified urinary incontinence: Secondary | ICD-10-CM | POA: Diagnosis not present

## 2015-12-12 DIAGNOSIS — A5409 Other gonococcal infection of lower genitourinary tract: Secondary | ICD-10-CM | POA: Diagnosis not present

## 2015-12-12 DIAGNOSIS — M6281 Muscle weakness (generalized): Secondary | ICD-10-CM | POA: Diagnosis not present

## 2015-12-12 DIAGNOSIS — R278 Other lack of coordination: Secondary | ICD-10-CM | POA: Diagnosis not present

## 2015-12-12 DIAGNOSIS — E871 Hypo-osmolality and hyponatremia: Secondary | ICD-10-CM | POA: Diagnosis not present

## 2015-12-12 NOTE — Telephone Encounter (Signed)
I would like for her to see Mliss Sax for a full set of vital signs.

## 2015-12-13 DIAGNOSIS — N3020 Other chronic cystitis without hematuria: Secondary | ICD-10-CM | POA: Diagnosis not present

## 2015-12-13 NOTE — Telephone Encounter (Signed)
Spoke with Mliss Sax and she will obtain the vitals.

## 2015-12-14 DIAGNOSIS — H6123 Impacted cerumen, bilateral: Secondary | ICD-10-CM | POA: Diagnosis not present

## 2015-12-14 DIAGNOSIS — H6093 Unspecified otitis externa, bilateral: Secondary | ICD-10-CM | POA: Diagnosis not present

## 2015-12-14 DIAGNOSIS — H903 Sensorineural hearing loss, bilateral: Secondary | ICD-10-CM | POA: Diagnosis not present

## 2015-12-20 DIAGNOSIS — R296 Repeated falls: Secondary | ICD-10-CM | POA: Diagnosis not present

## 2015-12-20 DIAGNOSIS — M6281 Muscle weakness (generalized): Secondary | ICD-10-CM | POA: Diagnosis not present

## 2015-12-20 DIAGNOSIS — E871 Hypo-osmolality and hyponatremia: Secondary | ICD-10-CM | POA: Diagnosis not present

## 2015-12-20 DIAGNOSIS — N39498 Other specified urinary incontinence: Secondary | ICD-10-CM | POA: Diagnosis not present

## 2015-12-20 DIAGNOSIS — N3946 Mixed incontinence: Secondary | ICD-10-CM | POA: Diagnosis not present

## 2015-12-20 DIAGNOSIS — A5409 Other gonococcal infection of lower genitourinary tract: Secondary | ICD-10-CM | POA: Diagnosis not present

## 2015-12-20 DIAGNOSIS — R278 Other lack of coordination: Secondary | ICD-10-CM | POA: Diagnosis not present

## 2016-01-04 ENCOUNTER — Encounter (INDEPENDENT_AMBULATORY_CARE_PROVIDER_SITE_OTHER): Payer: Medicare Other | Admitting: Ophthalmology

## 2016-01-04 DIAGNOSIS — H43813 Vitreous degeneration, bilateral: Secondary | ICD-10-CM | POA: Diagnosis not present

## 2016-01-04 DIAGNOSIS — H353211 Exudative age-related macular degeneration, right eye, with active choroidal neovascularization: Secondary | ICD-10-CM

## 2016-01-04 DIAGNOSIS — D3132 Benign neoplasm of left choroid: Secondary | ICD-10-CM | POA: Diagnosis not present

## 2016-01-04 DIAGNOSIS — H353121 Nonexudative age-related macular degeneration, left eye, early dry stage: Secondary | ICD-10-CM

## 2016-01-04 DIAGNOSIS — H2513 Age-related nuclear cataract, bilateral: Secondary | ICD-10-CM | POA: Diagnosis not present

## 2016-01-31 DIAGNOSIS — R339 Retention of urine, unspecified: Secondary | ICD-10-CM | POA: Diagnosis not present

## 2016-02-01 ENCOUNTER — Ambulatory Visit (INDEPENDENT_AMBULATORY_CARE_PROVIDER_SITE_OTHER): Payer: Medicare Other | Admitting: Internal Medicine

## 2016-02-01 ENCOUNTER — Encounter: Payer: Self-pay | Admitting: Internal Medicine

## 2016-02-01 VITALS — BP 128/70 | HR 67 | Temp 98.0°F | Wt 98.0 lb

## 2016-02-01 DIAGNOSIS — K648 Other hemorrhoids: Secondary | ICD-10-CM

## 2016-02-01 DIAGNOSIS — N952 Postmenopausal atrophic vaginitis: Secondary | ICD-10-CM | POA: Diagnosis not present

## 2016-02-01 DIAGNOSIS — Z85038 Personal history of other malignant neoplasm of large intestine: Secondary | ICD-10-CM

## 2016-02-01 NOTE — Patient Instructions (Signed)
Please continue your current high fiber diet. Use your suppository tonight for the friable, prolapsed hemorrhoid at your rectum. I don't see any new problem from your vagina.  It is just dry and lubricant may help. Use your cream at your rectum after bms.

## 2016-02-01 NOTE — Progress Notes (Signed)
Location:  [facility] clinic Provider: [place] Shannahan L. Mariea Clonts, D.O., C.M.D.  Code Status: DNR Goals of Care:  Advanced Directives 02/01/2016  Does patient have an advance directive? Yes  Type of Paramedic of [place];Living will;Out of facility DNR (pink MOST or yellow form)  Does patient want to make changes to advanced directive? -  Copy of advanced directive(s) in chart? Yes  Pre-existing out of facility DNR order (yellow form or pink MOST form) Yellow form placed in chart (order not valid for inpatient use);Pink MOST form placed in chart (order not valid for inpatient use)   Chief Complaint  Patient presents with  . Acute Visit    bleeding hemorrhoids    HPI: Patient is a 82 y.o. Small seen today for an acute visit for bleeding hemorrhoids.  Is frustrated that she bleeds so often.  A week ago before lunch, she had a lot of bleeding onto the floor.  Cleaned it up and put a suppository in and used a pad with the cream for the hemorrhoids.  It did subside.  She was not having a bm at the time.  Asks if she can use cream each time she has a bm--said yes.  Has only a few suppositories left.  She can eat the same things--her fiber one cereal, fiber muffin, hummus and no bm today, but went yesterday with the same things.  Drank less water though b/c she had to come here.  Drinks separately from eating to keep it under control.  Her bowels run her life with her extensive regimen and schedule.  May have to urinate every 30 mins or each time she stands up.  Will have to wait 5-6 mins sometimes before she can empty out her bladder fully.  Says gas medicine is helping a little bit.  She also wonders if she is having a little bit of blood coming from her vagina.    Past Medical History:  Diagnosis Date  . Anemia, unspecified   . Asymptomatic varicose veins   . Carotid bruit    hx of  . Carotid bruit    doppler normal in the past  . Diverticulosis   . Dyslipidemia   . Esophageal  reflux   . Small bladder prolapse, acquired 07/17/2010  . Fibroid   . Full incontinence of feces   . Hemorrhoid    1963 surgical excision  . History of colon cancer    Adenocarcinoma of right colon, stage T2, N1.s/p resection/chemotherapy 2002  . HTN (hypertension)   . Hx of colonic polyps    1965 surgical excision  . Hypothyroidism   . Idiopathic osteoporosis   . Idiopathic osteoporosis   . Internal hemorrhoids without mention of complication    bleed easily  . Irritable bowel syndrome   . Melanoma of skin, site unspecified   . MVP (mitral valve prolapse)   . OA (osteoarthritis)    left hip  . Osteoarthrosis, unspecified whether generalized or localized, pelvic region and thigh   . Peripheral neuropathy (HCC)    hands/ feet  . 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 Spinal stenosis, unspecified region other than cervical   . Vaginal bleeding, abnormal   . Vaginitis, atrophic   . Varicose veins     Past Surgical History:  Procedure Laterality Date  . COLONOSCOPY W/ POLYPECTOMY  2006   3 mm polyp destroyed, diverticulosis  . DILATION AND CURETTAGE OF UTERUS    . ESOPHAGOGASTRODUODENOSCOPY  2009   mild gastritis  .  HEMICOLECTOMY  2002   right  . [place]  . HYSTEROSCOPY    . MELANOMA EXCISION      Allergies  Allergen Reactions  . Amoxil [Amoxicillin] Diarrhea    Has patient had a PCN reaction causing immediate rash, facial/tongue/throat swelling, SOB or lightheadedness with hypotension: no Has patient had a PCN reaction causing severe rash involving mucus membranes or skin necrosis: no Has patient had a PCN reaction that required hospitalization : no Has patient had a PCN reaction occurring within the last 10 years: no If all of the above answers are "NO", then may proceed with Cephalosporin use.   Otho Darner Allergy]     sensitivity  . Demerol     unknown  . Meperidine Hcl Other (See Comments)    Drop in blood pressure  . Infed [Iron  Dextran] Rash    Became very red over face, scalp, and arms  . Neosporin [Neomycin-Bacitracin Zn-Polymyx] Rash    unknown      Medication List       Accurate as of 02/01/16  3:43 PM. Always use your most recent med list.          ALIGN 4 MG Caps Take 1 tablet by mouth daily.   ALPRAZolam 0.25 MG tablet Commonly known as:  XANAX Take 0.25 mg by mouth at bedtime as needed for anxiety.   antiseptic oral rinse Liqd 15 mLs by Mouth Rinse route as needed for dry mouth.   cholecalciferol 1000 units tablet Commonly known as:  VITAMIN D Take 1 tablet (1,000 Units total) by mouth daily.   CITRUCEL FIBERSHAKE oral powder Generic drug:  methylcellulose Take 1 packet by mouth daily.   fluticasone 50 MCG/ACT nasal spray Commonly known as:  FLONASE Place 1 spray into both nostrils daily.   fosfomycin 3 g Pack Commonly known as:  MONUROL Take 3 g by mouth once.   hydrocortisone 2.5 % rectal cream Commonly known as:  ANUSOL-HC Place 1 application rectally as needed for hemorrhoids or itching.   hydrocortisone 25 MG suppository Commonly known as:  ANUSOL-HC Place 25 mg rectally 2 (two) times daily as needed for hemorrhoids or itching.   levothyroxine 75 MCG tablet Commonly known as:  SYNTHROID, LEVOTHROID TAKE 1 TABLET DAILY BEFORE BREAKFAST FOR THYROID.   ranitidine 150 MG tablet Commonly known as:  ZANTAC Take 150 mg by mouth 2 (two) times daily as needed for heartburn.   TUMS 500 MG chewable tablet Generic drug:  calcium carbonate Chew 1 tablet by mouth daily as needed for indigestion (indigestion).   URIBEL PO Take 1 capsule by mouth daily.       Review of Systems:  Review of Systems  Constitutional: Negative for chills, fever and malaise/fatigue.  HENT: Positive for hearing loss.   Eyes:       Glasses  Respiratory: Negative for shortness of breath.   Cardiovascular: Negative for chest pain and palpitations.  Gastrointestinal: Positive for blood in stool  and constipation. Negative for abdominal pain, diarrhea, heartburn, melena, nausea and vomiting.  Genitourinary: Positive for dysuria, frequency and urgency. Negative for flank pain and hematuria.  Musculoskeletal: Negative for falls.  Neurological: Negative for dizziness and weakness.  Psychiatric/Behavioral: Negative for memory loss.    Health Maintenance  Topic Date Due  . ZOSTAVAX  09/[name]/1980  . INFLUENZA VACCINE  01/16/2016  . TETANUS/TDAP  10/08/2021  . DEXA SCAN  Completed  . PNA vac Low Risk Adult  Completed  Physical Exam: Vitals:   02/01/16 1501  BP: 128/70  Pulse: 67  Temp: 98 F (36.7 C)  TempSrc: Oral  SpO2: 98%  Weight: 98 lb (44.5 kg)   Body mass index is 21.97 kg/m. Physical Exam  Constitutional: She is oriented to person, place, and time. She appears well-developed and well-nourished. No distress.  Thin white Small walking with walker   Cardiovascular: Normal rate, regular rhythm and normal heart sounds.   No murmur heard. Pulmonary/Chest: Effort normal and breath sounds normal. No respiratory distress.  Abdominal: Soft. Bowel sounds are normal. She exhibits no distension and no mass. There is no tenderness. There is no guarding.  Genitourinary: Vagina normal. No vaginal discharge found.  Genitourinary Comments: Dry atrophic skin around vagina, labia, no visible bleeding or open areas; rectum with internal and external hemorrhoids  Musculoskeletal: Normal range of motion.  Walks with walker  Neurological: She is alert and oriented to person, place, and time.  Skin: Skin is warm and dry. Capillary refill takes less than 2 seconds. There is pallor.  Psychiatric: She has a normal mood and affect.  Anxious about her bowels    Labs reviewed: Basic Metabolic Panel:  Recent Labs  04/25/15 05/[name]/17 0700 12/01/15 1937  NA 140 142 125*  K 4.3 4.0 3.2*  CL  --   --  90*  CO2  --   --  24  GLUCOSE  --   --  127*  BUN 16 11 12   CREATININE 0.7 0.5 0.51   CALCIUM  --   --  8.3*  TSH 1.18  --   --    Liver Function Tests:  Recent Labs  04/25/15 12/01/15 1937  AST 27 37  ALT 14 21  ALKPHOS 97 75  BILITOT  --  1.4*  PROT  --  6.6  ALBUMIN  --  3.3*   No results for input(s): LIPASE, AMYLASE in the last 8760 hours. No results for input(s): AMMONIA in the last 8760 hours. CBC:  Recent Labs  04/25/15 05/[name]/17 0700 12/01/15 1937  WBC 5.2 4.8 11.8*  NEUTROABS  --   --  10.5*  HGB 12.5 12.6 10.5*  HCT 35* 36 30.6*  MCV  --   --  89.5  PLT 205 203 181   Lipid Panel:  Recent Labs  05/[name]/17 0700  CHOL 174  HDL 79*  LDLCALC 78  TRIG 87   Lab Results  Component Value Date   HGBA1C 5.9 12/02/2006    Assessment/Plan 1. Hemorrhoids, internal, with bleeding and prolapse -cont high fiber diet, hydration and anusol cream and suppositories  2. History of colon cancer -bleeding she has had is typically bright red and small amounts -she did have one episode of more bleeding, but when we discuss this, she is clear she wants no further testing to evaluate it or treatment if she should have any recurrence   3. Atrophic vaginitis -discussed use of lubricants or estrogen topically and maintaining a dry vaginal area in terms of urine as much as possible--she is already doing all of this  Instructions:   Please continue your current high fiber diet. Use your suppository tonight for the friable, prolapsed hemorrhoid at your rectum. I don't see any new problem from your vagina.  It is just dry and lubricant may help. Use your cream at your rectum after bms.  Labs/tests ordered:  No new Next appt:  02/14/2016  Andretta Ergle L. Morene Cecilio, D.O. [facility]  [address], [place] [address] Cell Phone (Mon-Fri 8am-5pm):  [phone] On Call:  [phone] & follow prompts after 5pm & weekends Office Phone:  [phone] Office Fax:  [phone]

## 2016-02-12 ENCOUNTER — Encounter (INDEPENDENT_AMBULATORY_CARE_PROVIDER_SITE_OTHER): Payer: Medicare Other | Admitting: Ophthalmology

## 2016-02-12 DIAGNOSIS — D3132 Benign neoplasm of left choroid: Secondary | ICD-10-CM

## 2016-02-12 DIAGNOSIS — H43813 Vitreous degeneration, bilateral: Secondary | ICD-10-CM | POA: Diagnosis not present

## 2016-02-12 DIAGNOSIS — H353211 Exudative age-related macular degeneration, right eye, with active choroidal neovascularization: Secondary | ICD-10-CM

## 2016-02-12 DIAGNOSIS — H353122 Nonexudative age-related macular degeneration, left eye, intermediate dry stage: Secondary | ICD-10-CM

## 2016-02-14 ENCOUNTER — Non-Acute Institutional Stay: Payer: Medicare Other | Admitting: Internal Medicine

## 2016-02-14 ENCOUNTER — Encounter: Payer: Self-pay | Admitting: Internal Medicine

## 2016-02-14 VITALS — BP 120/70 | HR 64 | Temp 98.5°F | Ht <= 58 in | Wt 97.0 lb

## 2016-02-14 DIAGNOSIS — Z8739 Personal history of other diseases of the musculoskeletal system and connective tissue: Secondary | ICD-10-CM

## 2016-02-14 DIAGNOSIS — G609 Hereditary and idiopathic neuropathy, unspecified: Secondary | ICD-10-CM | POA: Diagnosis not present

## 2016-02-14 DIAGNOSIS — I872 Venous insufficiency (chronic) (peripheral): Secondary | ICD-10-CM | POA: Diagnosis not present

## 2016-02-14 DIAGNOSIS — K648 Other hemorrhoids: Secondary | ICD-10-CM

## 2016-02-14 DIAGNOSIS — M21372 Foot drop, left foot: Secondary | ICD-10-CM

## 2016-02-14 MED ORDER — ALPRAZOLAM 0.25 MG PO TABS: 0.2500 mg | ORAL_TABLET | Freq: Every evening | ORAL | 1 refills | Status: DC | PRN

## 2016-02-14 NOTE — Progress Notes (Signed)
Location:   [facility] Service:  Clinic (12)  Provider: Marivel Mcclarty L. Mariea Clonts, D.O., C.M.D.  Code Status: DNR Goals of Care:  Advanced Directives 02/14/2016  Does patient have an advance directive? Yes  Type of Paramedic of [place];Living will;Out of facility DNR (pink MOST or yellow form)  Does patient want to make changes to advanced directive? -  Copy of advanced directive(s) in chart? Yes  Pre-existing out of facility DNR order (yellow form or pink MOST form) Yellow form placed in chart (order not valid for inpatient use);Pink MOST form placed in chart (order not valid for inpatient use)   Chief Complaint  Patient presents with  . Medical Management of Chronic Issues    3 mth follow-up   HPI: Patient is a 82 y.o. Small seen today for medical management of chronic diseases.  I just saw her last week due to concerns about a new problem causing her to have vaginal area and rectum.  She thought she may have a different part bleeding beside her hemorrhoids, but I didn't see anything new going on on her exam.  Has left big toe "dropping" during exercise.  Says a month ago when her son was here, it happened when she was leaving.  She had her lighter weight walker, tripped and went to her knees.  Feels like it just developed in the past month.  Does stub her toe often and feels like she cannot bend her toes up--it's worse on the left side, but also not great on the right.  Has been having a little more edema of her ankles lately also.  She is trying to elevate them in a recliner in the afternoon.    Completing abx for another UTI.  She's afebrile. She was having pain in her hemorrhoids when she's urinating.  Past Medical History:  Diagnosis Date  . Anemia, unspecified   . Asymptomatic varicose veins   . Carotid bruit    hx of  . Carotid bruit    doppler normal in the past  . Diverticulosis   . Dyslipidemia   . Esophageal reflux   . Small bladder  prolapse, acquired 07/17/2010  . Fibroid   . Full incontinence of feces   . Hemorrhoid    1963 surgical excision  . History of colon cancer    Adenocarcinoma of right colon, stage T2, N1.s/p resection/chemotherapy 2002  . HTN (hypertension)   . Hx of colonic polyps    1965 surgical excision  . Hypothyroidism   . Idiopathic osteoporosis   . Idiopathic osteoporosis   . Internal hemorrhoids without mention of complication    bleed easily  . Irritable bowel syndrome   . Melanoma of skin, site unspecified   . MVP (mitral valve prolapse)   . OA (osteoarthritis)    left hip  . Osteoarthrosis, unspecified whether generalized or localized, pelvic region and thigh   . Peripheral neuropathy (HCC)    hands/ feet  . 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 Spinal stenosis, unspecified region other than cervical   . Vaginal bleeding, abnormal   . Vaginitis, atrophic   . Varicose veins     Past Surgical History:  Procedure Laterality Date  . COLONOSCOPY W/ POLYPECTOMY  2006   3 mm polyp destroyed, diverticulosis  . DILATION AND CURETTAGE OF UTERUS    . ESOPHAGOGASTRODUODENOSCOPY  2009   mild gastritis  . HEMICOLECTOMY  2002   right  . [place]  .  HYSTEROSCOPY    . MELANOMA EXCISION      Allergies  Allergen Reactions  . Amoxil [Amoxicillin] Diarrhea    Has patient had a PCN reaction causing immediate rash, facial/tongue/throat swelling, SOB or lightheadedness with hypotension: no Has patient had a PCN reaction causing severe rash involving mucus membranes or skin necrosis: no Has patient had a PCN reaction that required hospitalization : no Has patient had a PCN reaction occurring within the last 10 years: no If all of the above answers are "NO", then may proceed with Cephalosporin use.   Otho Darner Allergy]     sensitivity  . Demerol     unknown  . Meperidine Hcl Other (See Comments)    Drop in blood pressure  . Infed [Iron Dextran] Rash    Became very  red over face, scalp, and arms  . Neosporin [Neomycin-Bacitracin Zn-Polymyx] Rash    unknown      Medication List       Accurate as of 02/14/16  8:45 AM. Always use your most recent med list.          ALIGN 4 MG Caps Take 1 tablet by mouth daily.   ALPRAZolam 0.25 MG tablet Commonly known as:  XANAX Take 0.25 mg by mouth at bedtime as needed for anxiety.   antiseptic oral rinse Liqd 15 mLs by Mouth Rinse route as needed for dry mouth.   cholecalciferol 1000 units tablet Commonly known as:  VITAMIN D Take 1 tablet (1,000 Units total) by mouth daily.   CITRUCEL FIBERSHAKE oral powder Generic drug:  methylcellulose Take 1 packet by mouth daily.   fluticasone 50 MCG/ACT nasal spray Commonly known as:  FLONASE Place 1 spray into both nostrils daily.   fosfomycin 3 g Pack Commonly known as:  MONUROL Take 3 g by mouth once.   hydrocortisone 2.5 % rectal cream Commonly known as:  ANUSOL-HC Place 1 application rectally as needed for hemorrhoids or itching.   hydrocortisone 25 MG suppository Commonly known as:  ANUSOL-HC Place 25 mg rectally 2 (two) times daily as needed for hemorrhoids or itching.   levothyroxine 75 MCG tablet Commonly known as:  SYNTHROID, LEVOTHROID TAKE 1 TABLET DAILY BEFORE BREAKFAST FOR THYROID.   nitrofurantoin (macrocrystal-monohydrate) 100 MG capsule Commonly known as:  MACROBID Take 100 mg by mouth 2 (two) times daily.   ranitidine 150 MG tablet Commonly known as:  ZANTAC Take 150 mg by mouth 2 (two) times daily as needed for heartburn.   TUMS 500 MG chewable tablet Generic drug:  calcium carbonate Chew 1 tablet by mouth daily as needed for indigestion (indigestion).   URIBEL PO Take 1 capsule by mouth daily.       Review of Systems:  Review of Systems  Constitutional: Negative for chills and fever.  HENT: Positive for hearing loss. Negative for congestion.   Eyes: Positive for blurred vision.       Glasses, gets drops  regularly  Respiratory: Negative for shortness of breath.   Cardiovascular: Negative for chest pain and palpitations.  Gastrointestinal: Positive for blood in stool and constipation. Negative for abdominal pain, diarrhea, melena, nausea and vomiting.  Genitourinary: Positive for dysuria, frequency and urgency. Negative for flank pain and hematuria.  Musculoskeletal: Negative for back pain and falls.  Skin: Negative for itching and rash.  Neurological: Negative for dizziness, loss of consciousness and headaches.  Endo/Heme/Allergies: Bruises/bleeds easily.  Psychiatric/Behavioral: Negative for memory loss. The patient is nervous/anxious.     Health Maintenance  Topic Date Due  . ZOSTAVAX  03/10/1979  . INFLUENZA VACCINE  01/16/2016  . TETANUS/TDAP  10/08/2021  . DEXA SCAN  Completed  . PNA vac Low Risk Adult  Completed    Physical Exam: Vitals:   02/14/16 0824  BP: 120/70  Pulse: 64  Temp: 98.5 F (36.9 C)  TempSrc: Oral  SpO2: 98%  Weight: 97 lb (44 kg)  Height: 4\' 8"  (1.422 m)   Body mass index is 21.75 kg/m. Physical Exam  Constitutional: She is oriented to person, place, and time. She appears well-developed and well-nourished.  HENT:  Head: Normocephalic and atraumatic.  HOH  Neck: Neck supple. No JVD present.  Cardiovascular: Normal rate, regular rhythm, normal heart sounds and intact distal pulses.   Pulmonary/Chest: Effort normal and breath sounds normal.  Abdominal: Soft. Bowel sounds are normal. She exhibits no distension. There is no tenderness.  Musculoskeletal: She exhibits no tenderness.  Left foot drop, unable to dorsiflex left foot  Neurological: She is alert and oriented to person, place, and time.  Skin: Skin is warm and dry. There is pallor.  Psychiatric:  Anxious, pleasant     Labs reviewed: Basic Metabolic Panel:  Recent Labs  04/25/15 11/07/15 0700 12/01/15 1937  NA 140 142 125*  K 4.3 4.0 3.2*  CL  --   --  90*  CO2  --   --  24    GLUCOSE  --   --  127*  BUN 16 11 12   CREATININE 0.7 0.5 0.51  CALCIUM  --   --  8.3*  TSH 1.18  --   --    Liver Function Tests:  Recent Labs  04/25/15 12/01/15 1937  AST 27 37  ALT 14 21  ALKPHOS 97 75  BILITOT  --  1.4*  PROT  --  6.6  ALBUMIN  --  3.3*   No results for input(s): LIPASE, AMYLASE in the last 8760 hours. No results for input(s): AMMONIA in the last 8760 hours. CBC:  Recent Labs  04/25/15 11/07/15 0700 12/01/15 1937  WBC 5.2 4.8 11.8*  NEUTROABS  --   --  10.5*  HGB 12.5 12.6 10.5*  HCT 35* 36 30.6*  MCV  --   --  89.5  PLT 205 203 181   Lipid Panel:  Recent Labs  11/07/15 0700  CHOL 174  HDL 79*  LDLCALC 78  TRIG 87   Lab Results  Component Value Date   HGBA1C 5.9 12/02/2006    Assessment/Plan 1. Left foot drop -has had prior low back pain, but no current symptoms outside of her foot drop -will refer to PT for strengthening and exercises  2. H/O low back pain -none at present, but suspect compressed nerve causing the foot drop  3. Hereditary and idiopathic peripheral neuropathy -also possibly a factor in her foot drop--has had this longstanding  4. Chronic venous insufficiency -elevate feet at rest -not a candidate for diuretics with her urinary issues  5. Hemorrhoids, internal, with bleeding and prolapse -currently stable, uses her anusol cream and suppositories, high fiber diet, hydration  Labs/tests ordered: PT referral Next appt:  3 mos med mgt  Roshawnda Pecora L. Breion Novacek, D.O. [facility] [address], [place] [address] Cell Phone (Mon-Fri 8am-5pm):  [phone] On Call:  [phone] & follow prompts after 5pm & weekends Office Phone:  [phone] Office Fax:  [phone]

## 2016-02-20 DIAGNOSIS — R338 Other retention of urine: Secondary | ICD-10-CM | POA: Diagnosis not present

## 2016-02-23 DIAGNOSIS — M21372 Foot drop, left foot: Secondary | ICD-10-CM | POA: Diagnosis not present

## 2016-02-23 DIAGNOSIS — Z9181 History of falling: Secondary | ICD-10-CM | POA: Diagnosis not present

## 2016-02-23 DIAGNOSIS — M4806 Spinal stenosis, lumbar region: Secondary | ICD-10-CM | POA: Diagnosis not present

## 2016-02-23 DIAGNOSIS — R2689 Other abnormalities of gait and mobility: Secondary | ICD-10-CM | POA: Diagnosis not present

## 2016-02-23 DIAGNOSIS — M62572 Muscle wasting and atrophy, not elsewhere classified, left ankle and foot: Secondary | ICD-10-CM | POA: Diagnosis not present

## 2016-02-28 DIAGNOSIS — R278 Other lack of coordination: Secondary | ICD-10-CM | POA: Diagnosis not present

## 2016-02-28 DIAGNOSIS — M6281 Muscle weakness (generalized): Secondary | ICD-10-CM | POA: Diagnosis not present

## 2016-02-28 DIAGNOSIS — R338 Other retention of urine: Secondary | ICD-10-CM | POA: Diagnosis not present

## 2016-02-28 DIAGNOSIS — M629 Disorder of muscle, unspecified: Secondary | ICD-10-CM | POA: Diagnosis not present

## 2016-03-04 DIAGNOSIS — C649 Malignant neoplasm of unspecified kidney, except renal pelvis: Secondary | ICD-10-CM | POA: Diagnosis not present

## 2016-03-04 DIAGNOSIS — N368 Other specified disorders of urethra: Secondary | ICD-10-CM | POA: Diagnosis not present

## 2016-03-04 DIAGNOSIS — N952 Postmenopausal atrophic vaginitis: Secondary | ICD-10-CM | POA: Diagnosis not present

## 2016-03-04 DIAGNOSIS — N3020 Other chronic cystitis without hematuria: Secondary | ICD-10-CM | POA: Diagnosis not present

## 2016-03-04 DIAGNOSIS — N312 Flaccid neuropathic bladder, not elsewhere classified: Secondary | ICD-10-CM | POA: Diagnosis not present

## 2016-03-13 ENCOUNTER — Other Ambulatory Visit: Payer: Self-pay | Admitting: Internal Medicine

## 2016-03-14 DIAGNOSIS — L603 Nail dystrophy: Secondary | ICD-10-CM | POA: Diagnosis not present

## 2016-03-25 ENCOUNTER — Encounter (INDEPENDENT_AMBULATORY_CARE_PROVIDER_SITE_OTHER): Payer: Medicare Other | Admitting: Ophthalmology

## 2016-03-25 DIAGNOSIS — D3132 Benign neoplasm of left choroid: Secondary | ICD-10-CM

## 2016-03-25 DIAGNOSIS — H43813 Vitreous degeneration, bilateral: Secondary | ICD-10-CM

## 2016-03-25 DIAGNOSIS — H353211 Exudative age-related macular degeneration, right eye, with active choroidal neovascularization: Secondary | ICD-10-CM

## 2016-03-25 DIAGNOSIS — H353122 Nonexudative age-related macular degeneration, left eye, intermediate dry stage: Secondary | ICD-10-CM

## 2016-03-25 DIAGNOSIS — H2513 Age-related nuclear cataract, bilateral: Secondary | ICD-10-CM

## 2016-04-05 DIAGNOSIS — R2689 Other abnormalities of gait and mobility: Secondary | ICD-10-CM | POA: Diagnosis not present

## 2016-04-05 DIAGNOSIS — Z9181 History of falling: Secondary | ICD-10-CM | POA: Diagnosis not present

## 2016-04-05 DIAGNOSIS — M21372 Foot drop, left foot: Secondary | ICD-10-CM | POA: Diagnosis not present

## 2016-04-05 DIAGNOSIS — M62572 Muscle wasting and atrophy, not elsewhere classified, left ankle and foot: Secondary | ICD-10-CM | POA: Diagnosis not present

## 2016-04-11 DIAGNOSIS — M21372 Foot drop, left foot: Secondary | ICD-10-CM | POA: Diagnosis not present

## 2016-04-11 DIAGNOSIS — R2689 Other abnormalities of gait and mobility: Secondary | ICD-10-CM | POA: Diagnosis not present

## 2016-04-11 DIAGNOSIS — Z9181 History of falling: Secondary | ICD-10-CM | POA: Diagnosis not present

## 2016-04-11 DIAGNOSIS — M62572 Muscle wasting and atrophy, not elsewhere classified, left ankle and foot: Secondary | ICD-10-CM | POA: Diagnosis not present

## 2016-04-11 DIAGNOSIS — Z23 Encounter for immunization: Secondary | ICD-10-CM | POA: Diagnosis not present

## 2016-04-12 ENCOUNTER — Encounter: Payer: Self-pay | Admitting: Internal Medicine

## 2016-04-12 ENCOUNTER — Ambulatory Visit (INDEPENDENT_AMBULATORY_CARE_PROVIDER_SITE_OTHER): Payer: Medicare Other | Admitting: Internal Medicine

## 2016-04-12 VITALS — BP 128/78 | HR 64 | Temp 98.3°F | Wt 96.0 lb

## 2016-04-12 DIAGNOSIS — R630 Anorexia: Secondary | ICD-10-CM

## 2016-04-12 DIAGNOSIS — Z85038 Personal history of other malignant neoplasm of large intestine: Secondary | ICD-10-CM

## 2016-04-12 DIAGNOSIS — J069 Acute upper respiratory infection, unspecified: Secondary | ICD-10-CM | POA: Diagnosis not present

## 2016-04-12 NOTE — Progress Notes (Signed)
Location:  [facility] clinic Provider: Darl Brisbin L. Mariea Clonts, D.O., C.M.D.  Code Status: DNR Goals of Care:  Advanced Directives 02/14/2016  Does patient have an advance directive? Yes  Type of Paramedic of [place];Living will;Out of facility DNR (pink MOST or yellow form)  Does patient want to make changes to advanced directive? -  Copy of advanced directive(s) in chart? Yes  Pre-existing out of facility DNR order (yellow form or pink MOST form) Yellow form placed in chart (order not valid for inpatient use);Pink MOST form placed in chart (order not valid for inpatient use)     Chief Complaint  Patient presents with  . Acute Visit    URI    HPI: Patient is a 82 y.o. female seen today for an acute visit for upper respiratory infection.  At first, she thought it was gastric reflux b/c she lost her appetite.  Didn't feel good.  Has been checked by Mliss Sax since last Friday. She has been afebrile.    Has lost one lb since last appt.  She is taking her probiotics and the trimethoprim to prevent UTIs--takes at bedtime.    Her niece from [place] who was returning from [place] came and they went out to dinner--she did eat a couple pieces of bread and some hamburger.  Yesterday, she had a sandwich for lunch.    Past Medical History:  Diagnosis Date  . Anemia, unspecified   . Asymptomatic varicose veins   . Carotid bruit    hx of  . Carotid bruit    doppler normal in the past  . Diverticulosis   . Dyslipidemia   . Esophageal reflux   . Female bladder prolapse, acquired 07/17/2010  . Fibroid   . Full incontinence of feces   . Hemorrhoid    1963 surgical excision  . History of colon cancer    Adenocarcinoma of right colon, stage T2, N1.s/p resection/chemotherapy 2002  . HTN (hypertension)   . Hx of colonic polyps    1965 surgical excision  . Hypothyroidism   . Idiopathic osteoporosis   . Idiopathic osteoporosis   . Internal hemorrhoids without mention of  complication    bleed easily  . Irritable bowel syndrome   . Melanoma of skin, site unspecified   . MVP (mitral valve prolapse)   . OA (osteoarthritis)    left hip  . Osteoarthrosis, unspecified whether generalized or localized, pelvic region and thigh   . Peripheral neuropathy (HCC)    hands/ feet  . 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 Spinal stenosis, unspecified region other than cervical   . Vaginal bleeding, abnormal   . Vaginitis, atrophic   . Varicose veins     Past Surgical History:  Procedure Laterality Date  . COLONOSCOPY W/ POLYPECTOMY  2006   3 mm polyp destroyed, diverticulosis  . DILATION AND CURETTAGE OF UTERUS    . ESOPHAGOGASTRODUODENOSCOPY  2009   mild gastritis  . HEMICOLECTOMY  2002   right  . [place]  . HYSTEROSCOPY    . MELANOMA EXCISION      Allergies  Allergen Reactions  . Amoxil [Amoxicillin] Diarrhea    Has patient had a PCN reaction causing immediate rash, facial/tongue/throat swelling, SOB or lightheadedness with hypotension: no Has patient had a PCN reaction causing severe rash involving mucus membranes or skin necrosis: no Has patient had a PCN reaction that required hospitalization : no Has patient had a PCN reaction occurring within the last 10 years: no  If all of the above answers are "NO", then may proceed with Cephalosporin use.   Otho Darner Allergy]     sensitivity  . Demerol     unknown  . Meperidine Hcl Other (See Comments)    Drop in blood pressure  . Infed [Iron Dextran] Rash    Became very red over face, scalp, and arms  . Neosporin [Neomycin-Bacitracin Zn-Polymyx] Rash    unknown      Medication List       Accurate as of 04/12/16 11:06 AM. Always use your most recent med list.          ALIGN 4 MG Caps Take 1 tablet by mouth daily.   ALPRAZolam 0.25 MG tablet Commonly known as:  XANAX Take 1 tablet (0.25 mg total) by mouth at bedtime as needed for anxiety.   antiseptic oral rinse  Liqd 15 mLs by Mouth Rinse route as needed for dry mouth.   cholecalciferol 1000 units tablet Commonly known as:  VITAMIN D Take 1 tablet (1,000 Units total) by mouth daily.   CITRUCEL FIBERSHAKE oral powder Generic drug:  methylcellulose Take 1 packet by mouth daily.   fluticasone 50 MCG/ACT nasal spray Commonly known as:  FLONASE Place 1 spray into both nostrils daily.   fosfomycin 3 g Pack Commonly known as:  MONUROL Take 3 g by mouth once.   hydrocortisone 2.5 % rectal cream Commonly known as:  ANUSOL-HC Place 1 application rectally as needed for hemorrhoids or itching.   hydrocortisone 25 MG suppository Commonly known as:  ANUSOL-HC Place 25 mg rectally 2 (two) times daily as needed for hemorrhoids or itching.   levothyroxine 75 MCG tablet Commonly known as:  SYNTHROID, LEVOTHROID TAKE 1 TABLET DAILY BEFORE BREAKFAST FOR THYROID.   nitrofurantoin (macrocrystal-monohydrate) 100 MG capsule Commonly known as:  MACROBID Take 100 mg by mouth 2 (two) times daily.   ranitidine 150 MG tablet Commonly known as:  ZANTAC Take 150 mg by mouth 2 (two) times daily as needed for heartburn.   TUMS 500 MG chewable tablet Generic drug:  calcium carbonate Chew 1 tablet by mouth daily as needed for indigestion (indigestion).   URIBEL PO Take 1 capsule by mouth daily.       Review of Systems:  Review of Systems  Constitutional: Positive for malaise/fatigue and weight loss. Negative for chills and fever.  HENT: Negative for congestion and sore throat.        Hoarseness  Eyes: Negative for blurred vision.  Respiratory: Positive for cough. Negative for hemoptysis, sputum production, shortness of breath and wheezing.   Cardiovascular: Negative for chest pain, palpitations and leg swelling.  Gastrointestinal: Positive for constipation and heartburn. Negative for abdominal pain, blood in stool and melena.  Genitourinary: Positive for frequency and urgency. Negative for dysuria.        Chronic symptoms; does I/O cath at hs  Musculoskeletal: Negative for falls, joint pain and myalgias.       Walking with walker and now has brace on left leg  Skin: Negative for rash.  Neurological: Negative for dizziness, loss of consciousness and weakness.  Endo/Heme/Allergies: Bruises/bleeds easily.  Psychiatric/Behavioral: Negative for depression and memory loss.    Health Maintenance  Topic Date Due  . ZOSTAVAX  03/10/1979  . INFLUENZA VACCINE  01/16/2016  . TETANUS/TDAP  10/08/2021  . DEXA SCAN  Completed  . PNA vac Low Risk Adult  Completed    Physical Exam: Vitals:   04/12/16 1059  BP: 128/78  Pulse: 64  Temp: 98.3 F (36.8 C)  TempSrc: Oral  SpO2: 98%  Weight: 96 lb (43.5 kg)   Body mass index is 21.52 kg/m. Physical Exam  Constitutional: She is oriented to person, place, and time. No distress.  HENT:  Head: Normocephalic and atraumatic.  Eyes:  glasses  Cardiovascular: Normal rate, regular rhythm, normal heart sounds and intact distal pulses.   Pulmonary/Chest: Effort normal and breath sounds normal. No respiratory distress.  Abdominal: Soft. Bowel sounds are normal.  Musculoskeletal: Normal range of motion.  Walks with walker with brace on left foot  Neurological: She is alert and oriented to person, place, and time.  Skin: Skin is warm and dry. Capillary refill takes less than 2 seconds. There is pallor.  Psychiatric: She has a normal mood and affect.    Labs reviewed: Basic Metabolic Panel:  Recent Labs  04/25/15 11/07/15 0700 12/01/15 1937  NA 140 142 125*  K 4.3 4.0 3.2*  CL  --   --  90*  CO2  --   --  24  GLUCOSE  --   --  127*  BUN 16 11 12   CREATININE 0.7 0.5 0.51  CALCIUM  --   --  8.3*  TSH 1.18  --   --    Liver Function Tests:  Recent Labs  04/25/15 12/01/15 1937  AST 27 37  ALT 14 21  ALKPHOS 97 75  BILITOT  --  1.4*  PROT  --  6.6  ALBUMIN  --  3.3*   No results for input(s): LIPASE, AMYLASE in the last 8760  hours. No results for input(s): AMMONIA in the last 8760 hours. CBC:  Recent Labs  04/25/15 11/07/15 0700 12/01/15 1937  WBC 5.2 4.8 11.8*  NEUTROABS  --   --  10.5*  HGB 12.5 12.6 10.5*  HCT 35* 36 30.6*  MCV  --   --  89.5  PLT 205 203 181   Lipid Panel:  Recent Labs  11/07/15 0700  CHOL 174  HDL 79*  LDLCALC 78  TRIG 87   Lab Results  Component Value Date   HGBA1C 5.9 12/02/2006    Assessment/Plan 1. Acute upper respiratory infection -seems this is almost entirely resolved--pretty much only had dry cough and decreased appetite  2. Decreased appetite -appetite slightly improved now but still eating less than usual since the cough and indigestion began--encouraged her to eat more as she can tolerate to maintain her strength  3. History of colon cancer - she wants to be tested in some way to see if her appetite loss is due to return of her malignancy - CEA  Labs/tests ordered:   Orders Placed This Encounter  Procedures  . CEA    Next appt:  05/15/2016  Fusako Tanabe L. Jakell Trusty, D.O. [facility] [address], [place] [address] Cell Phone (Mon-Fri 8am-5pm):  [phone] On Call:  [phone] & follow prompts after 5pm & weekends Office Phone:  [phone] Office Fax:  [phone]

## 2016-04-13 LAB — CEA: CEA: <0.5 ng/mL

## 2016-04-14 ENCOUNTER — Other Ambulatory Visit: Payer: Self-pay | Admitting: Internal Medicine

## 2016-04-15 ENCOUNTER — Other Ambulatory Visit: Payer: Self-pay | Admitting: *Deleted

## 2016-04-15 DIAGNOSIS — H6093 Unspecified otitis externa, bilateral: Secondary | ICD-10-CM | POA: Diagnosis not present

## 2016-04-15 DIAGNOSIS — H903 Sensorineural hearing loss, bilateral: Secondary | ICD-10-CM | POA: Diagnosis not present

## 2016-04-15 DIAGNOSIS — H6123 Impacted cerumen, bilateral: Secondary | ICD-10-CM | POA: Diagnosis not present

## 2016-04-15 MED ORDER — ALPRAZOLAM 0.25 MG PO TABS: ORAL_TABLET | ORAL | 1 refills | Status: DC

## 2016-04-15 NOTE — Telephone Encounter (Signed)
[facility]  

## 2016-04-19 ENCOUNTER — Telehealth: Payer: Self-pay | Admitting: *Deleted

## 2016-04-19 NOTE — Telephone Encounter (Signed)
Received Form from [employer] [phone] Fax:[phone] For Left Ankle Foot Orthosis due to Left Foot Drop. Placed in Dr. Magdalene Molly folder to review and sign.

## 2016-05-06 ENCOUNTER — Encounter (INDEPENDENT_AMBULATORY_CARE_PROVIDER_SITE_OTHER): Payer: Medicare Other | Admitting: Ophthalmology

## 2016-05-06 DIAGNOSIS — H43813 Vitreous degeneration, bilateral: Secondary | ICD-10-CM

## 2016-05-06 DIAGNOSIS — D3132 Benign neoplasm of left choroid: Secondary | ICD-10-CM

## 2016-05-06 DIAGNOSIS — H353122 Nonexudative age-related macular degeneration, left eye, intermediate dry stage: Secondary | ICD-10-CM | POA: Diagnosis not present

## 2016-05-06 DIAGNOSIS — H2513 Age-related nuclear cataract, bilateral: Secondary | ICD-10-CM | POA: Diagnosis not present

## 2016-05-06 DIAGNOSIS — H353211 Exudative age-related macular degeneration, right eye, with active choroidal neovascularization: Secondary | ICD-10-CM

## 2016-05-10 ENCOUNTER — Other Ambulatory Visit: Payer: Self-pay | Admitting: Internal Medicine

## 2016-05-15 ENCOUNTER — Encounter: Payer: Self-pay | Admitting: Internal Medicine

## 2016-05-15 ENCOUNTER — Non-Acute Institutional Stay: Payer: Medicare Other | Admitting: Internal Medicine

## 2016-05-15 VITALS — BP 120/70 | HR 65 | Temp 98.1°F | Wt 100.0 lb

## 2016-05-15 DIAGNOSIS — R339 Retention of urine, unspecified: Secondary | ICD-10-CM

## 2016-05-15 DIAGNOSIS — K644 Residual hemorrhoidal skin tags: Secondary | ICD-10-CM

## 2016-05-15 DIAGNOSIS — I872 Venous insufficiency (chronic) (peripheral): Secondary | ICD-10-CM

## 2016-05-15 DIAGNOSIS — M810 Age-related osteoporosis without current pathological fracture: Secondary | ICD-10-CM

## 2016-05-15 DIAGNOSIS — K648 Other hemorrhoids: Secondary | ICD-10-CM | POA: Diagnosis not present

## 2016-05-15 DIAGNOSIS — M1612 Unilateral primary osteoarthritis, left hip: Secondary | ICD-10-CM | POA: Diagnosis not present

## 2016-05-15 DIAGNOSIS — M21372 Foot drop, left foot: Secondary | ICD-10-CM | POA: Diagnosis not present

## 2016-05-15 NOTE — Progress Notes (Signed)
Location:  Occupational psychologist of Service:  Clinic (12)  Provider: Sadeel Fiddler L. Mariea Clonts, D.O., C.M.D.  Code Status: DNR Goals of Care:  Advanced Directives 02/14/2016  Does Patient Have a Medical Advance Directive? Yes  Type of Paramedic of [place];Living will;Out of facility DNR (pink MOST or yellow form)  Does patient want to make changes to medical advance directive? -  Copy of [place] in Chart? Yes  Pre-existing out of facility DNR order (yellow form or pink MOST form) Yellow form placed in chart (order not valid for inpatient use);Pink MOST form placed in chart (order not valid for inpatient use)     Chief Complaint  Patient presents with  . Medical Management of Chronic Issues    3 mth follow-up    HPI: Patient is a 82 y.o. female seen today for medical management of chronic diseases.    She is having more difficulty emptying.  She has I/O cath around 9pm, but then she is having to go small amts through the night now.  In the morning, it takes her until 12 or 1pm when she starts voiding small amts again.  Says she is going less on her own overall now.  She has to drink every 45 mins to 1 hr.  Having fewer accidents. Is taking probiotic and trimethoprim to prevent utis.    She is still having bleeding.  She's hd to use another rectal supp b/c the hemorrhoids popped out.  Not consistent as far as whether they will come out after the bm or later.  Sometimes urine comes out posteriorly and fluid comes out in the back.    Thinks it is very helpful to have her brace on her left foot.  She has to remember to put her heel down first.  It keeps her from tripping.  Legs and hips ache at different times  Which she thinks it might be related to walking differently.  She hsa more difficulty getting in and out of bed.  She is trying to relax in the afternoons for an hour.  Has neuropathy also and left hand falls sleep at night and  she tries to keep it out from under her pillow.    She took fosamax weekly for senile osteoporosis in the past after her diagnosis of OP, but takes ca with D and D now only.     Past Medical History:  Diagnosis Date  . Anemia, unspecified   . Asymptomatic varicose veins   . Carotid bruit    hx of  . Carotid bruit    doppler normal in the past  . Diverticulosis   . Dyslipidemia   . Esophageal reflux   . Female bladder prolapse, acquired 07/17/2010  . Fibroid   . Full incontinence of feces   . Hemorrhoid    1963 surgical excision  . History of colon cancer    Adenocarcinoma of right colon, stage T2, N1.s/p resection/chemotherapy 2002  . HTN (hypertension)   . Hx of colonic polyps    1965 surgical excision  . Hypothyroidism   . Idiopathic osteoporosis   . Idiopathic osteoporosis   . Internal hemorrhoids without mention of complication    bleed easily  . Irritable bowel syndrome   . Melanoma of skin, site unspecified   . MVP (mitral valve prolapse)   . OA (osteoarthritis)    left hip  . Osteoarthrosis, unspecified whether generalized or localized, pelvic region and thigh   .  Peripheral neuropathy (HCC)    hands/ feet  . 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 Spinal stenosis, unspecified region other than cervical   . Vaginal bleeding, abnormal   . Vaginitis, atrophic   . Varicose veins     Past Surgical History:  Procedure Laterality Date  . COLONOSCOPY W/ POLYPECTOMY  2006   3 mm polyp destroyed, diverticulosis  . DILATION AND CURETTAGE OF UTERUS    . ESOPHAGOGASTRODUODENOSCOPY  2009   mild gastritis  . HEMICOLECTOMY  2002   right  . [place]  . HYSTEROSCOPY    . MELANOMA EXCISION      Allergies  Allergen Reactions  . Amoxil [Amoxicillin] Diarrhea    Has patient had a PCN reaction causing immediate rash, facial/tongue/throat swelling, SOB or lightheadedness with hypotension: no Has patient had a PCN reaction causing severe rash involving mucus  membranes or skin necrosis: no Has patient had a PCN reaction that required hospitalization : no Has patient had a PCN reaction occurring within the last 10 years: no If all of the above answers are "NO", then may proceed with Cephalosporin use.   Otho Darner Allergy]     sensitivity  . Demerol     unknown  . Meperidine Hcl Other (See Comments)    Drop in blood pressure  . Infed [Iron Dextran] Rash    Became very red over face, scalp, and arms  . Neosporin [Neomycin-Bacitracin Zn-Polymyx] Rash    unknown      Medication List       Accurate as of 05/15/16  9:55 AM. Always use your most recent med list.          ALIGN 4 MG Caps Take 1 tablet by mouth daily.   ALPRAZolam 0.25 MG tablet Commonly known as:  XANAX TAKE 1 TABLET AT BEDTIME AS NEEDED FOR ANXIETY.   antiseptic oral rinse Liqd 15 mLs by Mouth Rinse route as needed for dry mouth.   cholecalciferol 1000 units tablet Commonly known as:  VITAMIN D Take 1 tablet (1,000 Units total) by mouth daily.   CITRUCEL FIBERSHAKE oral powder Generic drug:  methylcellulose Take 1 packet by mouth daily.   fluticasone 50 MCG/ACT nasal spray Commonly known as:  FLONASE Place 1 spray into both nostrils daily.   fosfomycin 3 g Pack Commonly known as:  MONUROL Take 3 g by mouth once.   hydrocortisone 2.5 % rectal cream Commonly known as:  ANUSOL-HC Place 1 application rectally as needed for hemorrhoids or itching.   hydrocortisone 25 MG suppository Commonly known as:  ANUSOL-HC Place 25 mg rectally 2 (two) times daily as needed for hemorrhoids or itching.   levothyroxine 75 MCG tablet Commonly known as:  SYNTHROID, LEVOTHROID TAKE 1 TABLET DAILY BEFORE BREAKFAST FOR THYROID.   nitrofurantoin (macrocrystal-monohydrate) 100 MG capsule Commonly known as:  MACROBID Take 100 mg by mouth 2 (two) times daily.   ranitidine 150 MG tablet Commonly known as:  ZANTAC Take 150 mg by mouth 2 (two) times daily as needed  for heartburn.   trimethoprim 100 MG tablet Commonly known as:  TRIMPEX Take 100 mg by mouth daily.   TUMS 500 MG chewable tablet Generic drug:  calcium carbonate Chew 1 tablet by mouth daily as needed for indigestion (indigestion).   URIBEL PO Take 1 capsule by mouth daily.       Review of Systems:  Review of Systems  Constitutional: Negative for chills, fever, malaise/fatigue and weight loss.  HENT: Negative for  congestion.   Eyes: Positive for blurred vision.       Glasses, drops in eyes  Respiratory: Negative for shortness of breath.   Cardiovascular: Negative for chest pain, palpitations and leg swelling.  Gastrointestinal: Positive for blood in stool. Negative for abdominal pain, constipation, diarrhea, heartburn, melena, nausea and vomiting.       Bleeding hemorrhoids  Genitourinary: Positive for frequency and urgency. Negative for dysuria, flank pain and hematuria.       Also incomplete bladder emptying, does I/O caths with nursing assistance  Musculoskeletal: Positive for joint pain. Negative for falls.       Left foot drop  Skin: Negative for itching and rash.  Neurological: Negative for dizziness, loss of consciousness and weakness.  Endo/Heme/Allergies: Bruises/bleeds easily.  Psychiatric/Behavioral: Negative for depression and memory loss. The patient is nervous/anxious. The patient does not have insomnia.     Health Maintenance  Topic Date Due  . ZOSTAVAX  03/10/1979  . INFLUENZA VACCINE  01/16/2016  . TETANUS/TDAP  10/08/2021  . DEXA SCAN  Completed  . PNA vac Low Risk Adult  Completed    Physical Exam: Vitals:   05/15/16 0940  BP: 120/70  Pulse: 65  Temp: 98.1 F (36.7 C)  TempSrc: Oral  SpO2: 98%  Weight: 100 lb (45.4 kg)   Body mass index is 22.42 kg/m. Physical Exam  Constitutional: She is oriented to person, place, and time. She appears well-nourished. No distress.  HENT:  Head: Normocephalic and atraumatic.  HOH  Eyes:  glasses    Cardiovascular: Normal rate, regular rhythm, normal heart sounds and intact distal pulses.   Pulmonary/Chest: Effort normal and breath sounds normal. No respiratory distress.  Abdominal: Bowel sounds are normal.  Musculoskeletal: Normal range of motion.  Has brace on left ankle now, walks with walker  Neurological: She is alert and oriented to person, place, and time.  Skin: Skin is warm and dry. There is pallor.  Psychiatric: She has a normal mood and affect. Her behavior is normal. Judgment and thought content normal.    Labs reviewed: Basic Metabolic Panel:  Recent Labs  11/07/15 0700 12/01/15 1937  NA 142 125*  K 4.0 3.2*  CL  --  90*  CO2  --  24  GLUCOSE  --  127*  BUN 11 12  CREATININE 0.5 0.51  CALCIUM  --  8.3*   Liver Function Tests:  Recent Labs  12/01/15 1937  AST 37  ALT 21  ALKPHOS 75  BILITOT 1.4*  PROT 6.6  ALBUMIN 3.3*   No results for input(s): LIPASE, AMYLASE in the last 8760 hours. No results for input(s): AMMONIA in the last 8760 hours. CBC:  Recent Labs  11/07/15 0700 12/01/15 1937  WBC 4.8 11.8*  NEUTROABS  --  10.5*  HGB 12.6 10.5*  HCT 36 30.6*  MCV  --  89.5  PLT 203 181   Lipid Panel:  Recent Labs  11/07/15 0700  CHOL 174  HDL 79*  LDLCALC 78  TRIG 87   Lab Results  Component Value Date   HGBA1C 5.9 12/02/2006    Assessment/Plan 1. Left foot drop -cont supportive brace which has helped her walking -cont use of walker  2. Urinary retention -having more difficulty with this and less difficulty with incontinence -cont nursing assist for I/O caths  3. Chronic venous insufficiency -cont compression hose, elevating feet at rest  4. Internal and external bleeding hemorrhoids -longstanding ongoing problem -cont use of anusol cream, supps, can  also do sitz baths as her frailty permits and with some nursing help  5. Senile osteoporosis - agrees to f/u bone density - was on fosamax at one point, now only on ca  with D - DG Bone Density; Future  6. Primary osteoarthritis of left hip -a bit more bothersome now--suspect due to change in gait since using brace for foot drop -use tylenol for pain, may also use ice or heat for comfort or topical agents  Labs/tests ordered:  Orders Placed This Encounter  Procedures  . DG Bone Density    Epic order & Order in Prof  Pf 03-10-12 [facility]     Standing Status:   Future    Standing Expiration Date:   07/15/2017    Order Specific Question:   Reason for Exam (SYMPTOM  OR DIAGNOSIS REQUIRED)    Answer:   senile osteoporosis, h/o fall    Order Specific Question:   Preferred imaging location?    Answer:   [facility]    Next appt:  08/14/2016  Pantelis Elgersma L. Williard Keller, D.O. [facility] [address], [place] [address] Cell Phone (Mon-Fri 8am-5pm):  [phone] On Call:  [phone] & follow prompts after 5pm & weekends Office Phone:  [phone] Office Fax:  [phone]

## 2016-06-04 DIAGNOSIS — N3020 Other chronic cystitis without hematuria: Secondary | ICD-10-CM | POA: Diagnosis not present

## 2016-06-07 DIAGNOSIS — T22211A Burn of second degree of right forearm, initial encounter: Secondary | ICD-10-CM | POA: Diagnosis not present

## 2016-06-19 ENCOUNTER — Encounter (INDEPENDENT_AMBULATORY_CARE_PROVIDER_SITE_OTHER): Payer: Medicare Other | Admitting: Ophthalmology

## 2016-06-19 ENCOUNTER — Telehealth: Payer: Self-pay | Admitting: *Deleted

## 2016-06-19 DIAGNOSIS — H353211 Exudative age-related macular degeneration, right eye, with active choroidal neovascularization: Secondary | ICD-10-CM | POA: Diagnosis not present

## 2016-06-19 DIAGNOSIS — H0012 Chalazion right lower eyelid: Secondary | ICD-10-CM | POA: Diagnosis not present

## 2016-06-19 DIAGNOSIS — H02102 Unspecified ectropion of right lower eyelid: Secondary | ICD-10-CM | POA: Diagnosis not present

## 2016-06-19 DIAGNOSIS — H353122 Nonexudative age-related macular degeneration, left eye, intermediate dry stage: Secondary | ICD-10-CM

## 2016-06-19 DIAGNOSIS — D3132 Benign neoplasm of left choroid: Secondary | ICD-10-CM

## 2016-06-19 DIAGNOSIS — H25813 Combined forms of age-related cataract, bilateral: Secondary | ICD-10-CM | POA: Diagnosis not present

## 2016-06-19 DIAGNOSIS — H01001 Unspecified blepharitis right upper eyelid: Secondary | ICD-10-CM | POA: Diagnosis not present

## 2016-06-19 NOTE — Telephone Encounter (Signed)
On 06/07/16 burn on right forearm, was seen by UC, [place]. Pt followed by UC.

## 2016-06-21 DIAGNOSIS — N3020 Other chronic cystitis without hematuria: Secondary | ICD-10-CM | POA: Diagnosis not present

## 2016-06-21 DIAGNOSIS — R350 Frequency of micturition: Secondary | ICD-10-CM | POA: Diagnosis not present

## 2016-06-21 DIAGNOSIS — R338 Other retention of urine: Secondary | ICD-10-CM | POA: Diagnosis not present

## 2016-06-24 ENCOUNTER — Encounter (INDEPENDENT_AMBULATORY_CARE_PROVIDER_SITE_OTHER): Payer: Self-pay | Admitting: Ophthalmology

## 2016-07-05 ENCOUNTER — Encounter (INDEPENDENT_AMBULATORY_CARE_PROVIDER_SITE_OTHER): Payer: Self-pay | Admitting: Ophthalmology

## 2016-07-11 DIAGNOSIS — H01001 Unspecified blepharitis right upper eyelid: Secondary | ICD-10-CM | POA: Diagnosis not present

## 2016-07-11 DIAGNOSIS — H02105 Unspecified ectropion of left lower eyelid: Secondary | ICD-10-CM | POA: Diagnosis not present

## 2016-07-11 DIAGNOSIS — H25813 Combined forms of age-related cataract, bilateral: Secondary | ICD-10-CM | POA: Diagnosis not present

## 2016-07-11 DIAGNOSIS — H02102 Unspecified ectropion of right lower eyelid: Secondary | ICD-10-CM | POA: Diagnosis not present

## 2016-07-12 ENCOUNTER — Other Ambulatory Visit: Payer: Self-pay | Admitting: Nurse Practitioner

## 2016-07-15 ENCOUNTER — Other Ambulatory Visit: Payer: Self-pay | Admitting: Nurse Practitioner

## 2016-07-15 ENCOUNTER — Encounter (INDEPENDENT_AMBULATORY_CARE_PROVIDER_SITE_OTHER): Payer: Medicare Other | Admitting: Ophthalmology

## 2016-07-15 DIAGNOSIS — H353211 Exudative age-related macular degeneration, right eye, with active choroidal neovascularization: Secondary | ICD-10-CM | POA: Diagnosis not present

## 2016-07-15 DIAGNOSIS — H2513 Age-related nuclear cataract, bilateral: Secondary | ICD-10-CM | POA: Diagnosis not present

## 2016-07-15 DIAGNOSIS — H43813 Vitreous degeneration, bilateral: Secondary | ICD-10-CM

## 2016-07-15 DIAGNOSIS — D3132 Benign neoplasm of left choroid: Secondary | ICD-10-CM | POA: Diagnosis not present

## 2016-07-15 DIAGNOSIS — H353122 Nonexudative age-related macular degeneration, left eye, intermediate dry stage: Secondary | ICD-10-CM | POA: Diagnosis not present

## 2016-08-06 ENCOUNTER — Other Ambulatory Visit: Payer: Self-pay | Admitting: Internal Medicine

## 2016-08-06 DIAGNOSIS — L603 Nail dystrophy: Secondary | ICD-10-CM | POA: Diagnosis not present

## 2016-08-06 DIAGNOSIS — L84 Corns and callosities: Secondary | ICD-10-CM | POA: Diagnosis not present

## 2016-08-12 ENCOUNTER — Encounter (INDEPENDENT_AMBULATORY_CARE_PROVIDER_SITE_OTHER): Payer: Self-pay | Admitting: Ophthalmology

## 2016-08-12 ENCOUNTER — Other Ambulatory Visit: Payer: Self-pay | Admitting: *Deleted

## 2016-08-12 ENCOUNTER — Other Ambulatory Visit: Payer: Self-pay | Admitting: Internal Medicine

## 2016-08-12 ENCOUNTER — Encounter (INDEPENDENT_AMBULATORY_CARE_PROVIDER_SITE_OTHER): Payer: Medicare Other | Admitting: Ophthalmology

## 2016-08-12 DIAGNOSIS — H43813 Vitreous degeneration, bilateral: Secondary | ICD-10-CM

## 2016-08-12 DIAGNOSIS — H353211 Exudative age-related macular degeneration, right eye, with active choroidal neovascularization: Secondary | ICD-10-CM | POA: Diagnosis not present

## 2016-08-12 DIAGNOSIS — H353122 Nonexudative age-related macular degeneration, left eye, intermediate dry stage: Secondary | ICD-10-CM

## 2016-08-12 DIAGNOSIS — D3132 Benign neoplasm of left choroid: Secondary | ICD-10-CM | POA: Diagnosis not present

## 2016-08-12 MED ORDER — ALPRAZOLAM 0.25 MG PO TABS: ORAL_TABLET | ORAL | 0 refills | Status: DC

## 2016-08-12 NOTE — Telephone Encounter (Signed)
[place]

## 2016-08-12 NOTE — Telephone Encounter (Signed)
rx called into pharmacy

## 2016-08-14 ENCOUNTER — Encounter: Payer: Medicare Other | Admitting: Internal Medicine

## 2016-08-21 DIAGNOSIS — N3020 Other chronic cystitis without hematuria: Secondary | ICD-10-CM | POA: Diagnosis not present

## 2016-08-21 DIAGNOSIS — R351 Nocturia: Secondary | ICD-10-CM | POA: Diagnosis not present

## 2016-08-28 ENCOUNTER — Non-Acute Institutional Stay: Payer: Medicare Other | Admitting: Internal Medicine

## 2016-08-28 ENCOUNTER — Encounter: Payer: Self-pay | Admitting: Internal Medicine

## 2016-08-28 VITALS — BP 130/60 | HR 68 | Temp 98.1°F | Wt 97.0 lb

## 2016-08-28 DIAGNOSIS — M6281 Muscle weakness (generalized): Secondary | ICD-10-CM | POA: Diagnosis not present

## 2016-08-28 DIAGNOSIS — R339 Retention of urine, unspecified: Secondary | ICD-10-CM | POA: Diagnosis not present

## 2016-08-28 DIAGNOSIS — R152 Fecal urgency: Secondary | ICD-10-CM

## 2016-08-28 DIAGNOSIS — R159 Full incontinence of feces: Secondary | ICD-10-CM | POA: Diagnosis not present

## 2016-08-28 DIAGNOSIS — N390 Urinary tract infection, site not specified: Secondary | ICD-10-CM

## 2016-08-28 DIAGNOSIS — W182XXA Fall in (into) shower or empty bathtub, initial encounter: Secondary | ICD-10-CM

## 2016-08-28 DIAGNOSIS — R1031 Right lower quadrant pain: Secondary | ICD-10-CM | POA: Diagnosis not present

## 2016-08-28 NOTE — Progress Notes (Signed)
Location:  Occupational psychologist of Service:  Clinic (12)  Provider: Juvia Aerts L. Mariea Clonts, D.O., C.M.D.  Code Status: DNR Goals of Care:  Advanced Directives 08/28/2016  Does Patient Have a Medical Advance Directive? Yes  Type of Paramedic of [place];Living will;Out of facility DNR (pink MOST or yellow form)  Does patient want to make changes to medical advance directive? -  Copy of [place] in Chart? Yes  Pre-existing out of facility DNR order (yellow form or pink MOST form) Yellow form placed in chart (order not valid for inpatient use);Pink MOST form placed in chart (order not valid for inpatient use)   Chief Complaint  Patient presents with  . Medical Management of Chronic Issues    36mth follow-up    HPI: Patient is a 83 y.o. female seen today for medical management of chronic diseases.    She suddenly fell backward into the tub when she was putting her gown on the hook of the door--she did not hit hard.  She was not wearing her necklace.  Had to wait until 10am check-in visit.  Nurse came to check on her.  Reports that one time before when she had loose stool all over, she was reaching for the pone and she fell backwards then also.  Dr. Hassell Done wanted to know if her latest episode could be subclavian steal syndrome.      Her right groin area was very sore and painful.  No matter what she did, it was very painful.  She is taking sitting exercises.  She thinks they have helped the pain.  It is much better now.  It was an 8/10 and now maybe 2-3/10 when she moves her leg.   She is voiding all night now--she is going so many times--went 6 times a few nights.  She thinks she's had an infection for a few weeks.  Went to urology and already had the frequency, started cipro just yesterday when culture results returned.  She is to take the cipro for a week and then take methamine mandelate.  Had a burn on her wrist and forearm from hot  tea.  She took a medication for it but stopped it after 2.5 weeks.  was getting dressings and checks.      Needs a root canal done.  She took a strong medicine for it.  The urologist also didn't want her to take it.  Pt stopped halfway through and she opted not to go for the root canal.    She is having more difficulty getting up out of the chair.  She's wondering if PT can help her with an exercise to work on getting up.    Talks about having pain in her vaginal area, then passing urine 5 different times which started off with stones coming out.  She saw no hematuria or discoloration to urine that she can see (vision very poor).    Past Medical History:  Diagnosis Date  . Anemia, unspecified   . Asymptomatic varicose veins   . Carotid bruit    hx of  . Carotid bruit    doppler normal in the past  . Diverticulosis   . Dyslipidemia   . Esophageal reflux   . Female bladder prolapse, acquired 07/17/2010  . Fibroid   . Full incontinence of feces   . Hemorrhoid    1963 surgical excision  . History of colon cancer    Adenocarcinoma of right colon,  stage T2, N1.s/p resection/chemotherapy 2002  . HTN (hypertension)   . Hx of colonic polyps    1965 surgical excision  . Hypothyroidism   . Idiopathic osteoporosis   . Idiopathic osteoporosis   . Internal hemorrhoids without mention of complication    bleed easily  . Irritable bowel syndrome   . Melanoma of skin, site unspecified   . MVP (mitral valve prolapse)   . OA (osteoarthritis)    left hip  . Osteoarthrosis, unspecified whether generalized or localized, pelvic region and thigh   . Peripheral neuropathy (HCC)    hands/ feet  . 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 Spinal stenosis, unspecified region other than cervical   . Vaginal bleeding, abnormal   . Vaginitis, atrophic   . Varicose veins     Past Surgical History:  Procedure Laterality Date  . COLONOSCOPY W/ POLYPECTOMY  2006   3 mm polyp destroyed, diverticulosis  .  DILATION AND CURETTAGE OF UTERUS    . ESOPHAGOGASTRODUODENOSCOPY  2009   mild gastritis  . HEMICOLECTOMY  2002   right  . [place]  . HYSTEROSCOPY    . MELANOMA EXCISION      Allergies  Allergen Reactions  . Amoxil [Amoxicillin] Diarrhea    Has patient had a PCN reaction causing immediate rash, facial/tongue/throat swelling, SOB or lightheadedness with hypotension: no Has patient had a PCN reaction causing severe rash involving mucus membranes or skin necrosis: no Has patient had a PCN reaction that required hospitalization : no Has patient had a PCN reaction occurring within the last 10 years: no If all of the above answers are "NO", then may proceed with Cephalosporin use.   Otho Darner Allergy]     sensitivity  . Demerol     unknown  . Meperidine Hcl Other (See Comments)    Drop in blood pressure  . Infed [Iron Dextran] Rash    Became very red over face, scalp, and arms  . Neosporin [Neomycin-Bacitracin Zn-Polymyx] Rash    unknown    Allergies as of 08/28/2016      Reactions   Amoxil [amoxicillin] Diarrhea   Has patient had a PCN reaction causing immediate rash, facial/tongue/throat swelling, SOB or lightheadedness with hypotension: no Has patient had a PCN reaction causing severe rash involving mucus membranes or skin necrosis: no Has patient had a PCN reaction that required hospitalization : no Has patient had a PCN reaction occurring within the last 10 years: no If all of the above answers are "NO", then may proceed with Cephalosporin use.   Crab [shellfish Allergy]    sensitivity   Demerol    unknown   Meperidine Hcl Other (See Comments)   Drop in blood pressure   Infed [iron Dextran] Rash   Became very red over face, scalp, and arms   Neosporin [neomycin-bacitracin Zn-polymyx] Rash   unknown      Medication List       Accurate as of 08/28/16 10:18 AM. Always use your most recent med list.          ALIGN 4 MG Caps Take 1 tablet by  mouth daily.   ALPRAZolam 0.25 MG tablet Commonly known as:  XANAX Take one tablet by mouth at bedtime as needed for anxiety   antiseptic oral rinse Liqd 15 mLs by Mouth Rinse route as needed for dry mouth.   cholecalciferol 1000 units tablet Commonly known as:  VITAMIN D Take 1 tablet (1,000 Units total) by mouth daily.  CITRUCEL FIBERSHAKE oral powder Generic drug:  methylcellulose Take 1 packet by mouth daily.   fluticasone 50 MCG/ACT nasal spray Commonly known as:  FLONASE Place 1 spray into both nostrils daily.   fosfomycin 3 g Pack Commonly known as:  MONUROL Take 3 g by mouth once.   hydrocortisone 2.5 % rectal cream Commonly known as:  ANUSOL-HC Place 1 application rectally as needed for hemorrhoids or itching.   hydrocortisone 25 MG suppository Commonly known as:  ANUSOL-HC Place 25 mg rectally 2 (two) times daily as needed for hemorrhoids or itching.   levothyroxine 75 MCG tablet Commonly known as:  SYNTHROID, LEVOTHROID TAKE 1 TABLET DAILY BEFORE BREAKFAST FOR THYROID.   methenamine 1 g tablet Commonly known as:  HIPREX Take 1 g by mouth at bedtime.   nitrofurantoin (macrocrystal-monohydrate) 100 MG capsule Commonly known as:  MACROBID Take 100 mg by mouth 2 (two) times daily.   ranitidine 150 MG tablet Commonly known as:  ZANTAC Take 150 mg by mouth 2 (two) times daily as needed for heartburn.   trimethoprim 100 MG tablet Commonly known as:  TRIMPEX Take 100 mg by mouth daily.   TUMS 500 MG chewable tablet Generic drug:  calcium carbonate Chew 1 tablet by mouth daily as needed for indigestion (indigestion).   URIBEL PO Take 1 capsule by mouth daily.       Review of Systems:  Review of Systems  Constitutional: Negative for chills, fever and malaise/fatigue.  HENT: Negative for congestion.   Eyes: Positive for blurred vision.  Respiratory: Negative for cough and shortness of breath.   Cardiovascular: Negative for chest pain, palpitations  and leg swelling.  Gastrointestinal: Positive for constipation. Negative for abdominal pain, blood in stool, diarrhea and melena.       Fecal incontinence  Genitourinary: Positive for frequency and urgency. Negative for dysuria.       Retention and incontinence  Musculoskeletal: Positive for falls and joint pain.  Skin: Negative for itching and rash.  Neurological: Positive for weakness. Negative for dizziness and loss of consciousness.  Endo/Heme/Allergies: Bruises/bleeds easily.  Psychiatric/Behavioral: Negative for depression and memory loss. The patient is nervous/anxious.     Health Maintenance  Topic Date Due  . TETANUS/TDAP  10/08/2021  . INFLUENZA VACCINE  Completed  . DEXA SCAN  Completed  . PNA vac Low Risk Adult  Completed    Physical Exam: Vitals:   08/28/16 0931  BP: 130/60  Pulse: 68  Temp: 98.1 F (36.7 C)  TempSrc: Oral  SpO2: 98%  Weight: 97 lb (44 kg)   Body mass index is 21.75 kg/m. Physical Exam  Constitutional: She is oriented to person, place, and time. She appears well-developed and well-nourished. No distress.  Cardiovascular: Normal rate, regular rhythm, normal heart sounds and intact distal pulses.   Pulmonary/Chest: Effort normal and breath sounds normal. No respiratory distress.  Abdominal: Soft. Bowel sounds are normal.  Musculoskeletal: Normal range of motion.  Left foot drop, no right groin tenderness on exam, did have difficulty getting up out of chair even with walker for support  Neurological: She is alert and oriented to person, place, and time.  Skin: Skin is warm and dry. There is pallor.  Psychiatric: She has a normal mood and affect.    Labs reviewed: Basic Metabolic Panel:  Recent Labs  11/07/15 0700 12/01/15 1937  NA 142 125*  K 4.0 3.2*  CL  --  90*  CO2  --  24  GLUCOSE  --  127*  BUN 11 12  CREATININE 0.5 0.51  CALCIUM  --  8.3*   Liver Function Tests:  Recent Labs  12/01/15 1937  AST 37  ALT 21  ALKPHOS  75  BILITOT 1.4*  PROT 6.6  ALBUMIN 3.3*   No results for input(s): LIPASE, AMYLASE in the last 8760 hours. No results for input(s): AMMONIA in the last 8760 hours. CBC:  Recent Labs  11/07/15 0700 12/01/15 1937  WBC 4.8 11.8*  NEUTROABS  --  10.5*  HGB 12.6 10.5*  HCT 36 30.6*  MCV  --  89.5  PLT 203 181   Lipid Panel:  Recent Labs  11/07/15 0700  CHOL 174  HDL 79*  LDLCALC 78  TRIG 87   Lab Results  Component Value Date   HGBA1C 5.9 12/02/2006    Assessment/Plan 1. Fall in bathtub, initial encounter -was sudden, fell backwards, could be a circulatory issue, but pt would not want intervention so no reason to do a series of expensive tests   2. Right groin pain -has improved with exercises in chair exercise program, but still has some pain while walking--I suspect it's due to arthritis of her right hip -referred for PT for this and #5  3. Recurrent UTI (urinary tract infection) -currently on cipro and then will begin methenamine as her next prophylactic per urology -was having increased frequency at night that led to testing  4. Urinary retention -chronic, gets cathed by nursing staff in evening  5. Proximal muscle weakness -not clear if increased difficulty getting up out of chair was due to right hip OA or simply progressing weakness from frailty and sarcopenia  6. Incontinence of feces with fecal urgency -chronic, contributes to UTIs b/c of difficulty maintaining hygiene  Labs/tests ordered:  No orders of the defined types were placed in this encounter. PT eval and tx   Next appt:  11/27/2016  Vester Titsworth L. Kosha Jaquith, D.O. [facility] [address], [place] [address] Cell Phone (Mon-Fri 8am-5pm):  [phone] On Call:  [phone] & follow prompts after 5pm & weekends Office Phone:  [phone] Office Fax:  [phone]

## 2016-08-30 ENCOUNTER — Telehealth: Payer: Self-pay | Admitting: *Deleted

## 2016-08-30 NOTE — Telephone Encounter (Signed)
[facility] patient called and stated that she wants you to write an order and fax it to [facility] for her to get PT with [facility]. Please fax order. Please Advise.

## 2016-08-30 NOTE — Telephone Encounter (Signed)
Left message to have someone return my call, per last OV with Dr. Mariea Clonts, she wrote the order and gave to the patient, pt put order in her rolling walker, per Dr. Mariea Clonts

## 2016-08-30 NOTE — Telephone Encounter (Signed)
.  left message to have patient return my call.  

## 2016-09-06 ENCOUNTER — Other Ambulatory Visit: Payer: Self-pay | Admitting: Internal Medicine

## 2016-09-09 ENCOUNTER — Encounter (INDEPENDENT_AMBULATORY_CARE_PROVIDER_SITE_OTHER): Payer: Medicare Other | Admitting: Ophthalmology

## 2016-09-09 DIAGNOSIS — H353211 Exudative age-related macular degeneration, right eye, with active choroidal neovascularization: Secondary | ICD-10-CM | POA: Diagnosis not present

## 2016-09-09 DIAGNOSIS — H2513 Age-related nuclear cataract, bilateral: Secondary | ICD-10-CM | POA: Diagnosis not present

## 2016-09-09 DIAGNOSIS — H353122 Nonexudative age-related macular degeneration, left eye, intermediate dry stage: Secondary | ICD-10-CM

## 2016-09-09 DIAGNOSIS — H43813 Vitreous degeneration, bilateral: Secondary | ICD-10-CM | POA: Diagnosis not present

## 2016-09-09 DIAGNOSIS — D3132 Benign neoplasm of left choroid: Secondary | ICD-10-CM

## 2016-09-11 DIAGNOSIS — M1612 Unilateral primary osteoarthritis, left hip: Secondary | ICD-10-CM | POA: Diagnosis not present

## 2016-09-11 DIAGNOSIS — R278 Other lack of coordination: Secondary | ICD-10-CM | POA: Diagnosis not present

## 2016-09-11 DIAGNOSIS — N312 Flaccid neuropathic bladder, not elsewhere classified: Secondary | ICD-10-CM | POA: Diagnosis not present

## 2016-09-11 DIAGNOSIS — M48061 Spinal stenosis, lumbar region without neurogenic claudication: Secondary | ICD-10-CM | POA: Diagnosis not present

## 2016-09-11 DIAGNOSIS — R2689 Other abnormalities of gait and mobility: Secondary | ICD-10-CM | POA: Diagnosis not present

## 2016-09-11 DIAGNOSIS — R2681 Unsteadiness on feet: Secondary | ICD-10-CM | POA: Diagnosis not present

## 2016-09-12 DIAGNOSIS — R2689 Other abnormalities of gait and mobility: Secondary | ICD-10-CM | POA: Diagnosis not present

## 2016-09-12 DIAGNOSIS — M1612 Unilateral primary osteoarthritis, left hip: Secondary | ICD-10-CM | POA: Diagnosis not present

## 2016-09-12 DIAGNOSIS — M48061 Spinal stenosis, lumbar region without neurogenic claudication: Secondary | ICD-10-CM | POA: Diagnosis not present

## 2016-09-12 DIAGNOSIS — R278 Other lack of coordination: Secondary | ICD-10-CM | POA: Diagnosis not present

## 2016-09-12 DIAGNOSIS — R2681 Unsteadiness on feet: Secondary | ICD-10-CM | POA: Diagnosis not present

## 2016-09-16 ENCOUNTER — Other Ambulatory Visit: Payer: Self-pay | Admitting: Internal Medicine

## 2016-09-16 ENCOUNTER — Other Ambulatory Visit: Payer: Self-pay | Admitting: *Deleted

## 2016-09-16 NOTE — Telephone Encounter (Signed)
rx called into pharmacy

## 2016-09-17 DIAGNOSIS — M48061 Spinal stenosis, lumbar region without neurogenic claudication: Secondary | ICD-10-CM | POA: Diagnosis not present

## 2016-09-17 DIAGNOSIS — M1612 Unilateral primary osteoarthritis, left hip: Secondary | ICD-10-CM | POA: Diagnosis not present

## 2016-09-17 DIAGNOSIS — R2681 Unsteadiness on feet: Secondary | ICD-10-CM | POA: Diagnosis not present

## 2016-09-17 DIAGNOSIS — R278 Other lack of coordination: Secondary | ICD-10-CM | POA: Diagnosis not present

## 2016-09-17 DIAGNOSIS — R2689 Other abnormalities of gait and mobility: Secondary | ICD-10-CM | POA: Diagnosis not present

## 2016-09-19 DIAGNOSIS — M48061 Spinal stenosis, lumbar region without neurogenic claudication: Secondary | ICD-10-CM | POA: Diagnosis not present

## 2016-09-19 DIAGNOSIS — R2681 Unsteadiness on feet: Secondary | ICD-10-CM | POA: Diagnosis not present

## 2016-09-19 DIAGNOSIS — R278 Other lack of coordination: Secondary | ICD-10-CM | POA: Diagnosis not present

## 2016-09-19 DIAGNOSIS — M1612 Unilateral primary osteoarthritis, left hip: Secondary | ICD-10-CM | POA: Diagnosis not present

## 2016-09-19 DIAGNOSIS — R2689 Other abnormalities of gait and mobility: Secondary | ICD-10-CM | POA: Diagnosis not present

## 2016-09-25 DIAGNOSIS — M1612 Unilateral primary osteoarthritis, left hip: Secondary | ICD-10-CM | POA: Diagnosis not present

## 2016-09-25 DIAGNOSIS — R278 Other lack of coordination: Secondary | ICD-10-CM | POA: Diagnosis not present

## 2016-09-25 DIAGNOSIS — N3020 Other chronic cystitis without hematuria: Secondary | ICD-10-CM | POA: Diagnosis not present

## 2016-09-25 DIAGNOSIS — N3941 Urge incontinence: Secondary | ICD-10-CM | POA: Diagnosis not present

## 2016-09-25 DIAGNOSIS — M48061 Spinal stenosis, lumbar region without neurogenic claudication: Secondary | ICD-10-CM | POA: Diagnosis not present

## 2016-09-25 DIAGNOSIS — R2689 Other abnormalities of gait and mobility: Secondary | ICD-10-CM | POA: Diagnosis not present

## 2016-09-25 DIAGNOSIS — R2681 Unsteadiness on feet: Secondary | ICD-10-CM | POA: Diagnosis not present

## 2016-09-30 DIAGNOSIS — R2689 Other abnormalities of gait and mobility: Secondary | ICD-10-CM | POA: Diagnosis not present

## 2016-09-30 DIAGNOSIS — M48061 Spinal stenosis, lumbar region without neurogenic claudication: Secondary | ICD-10-CM | POA: Diagnosis not present

## 2016-09-30 DIAGNOSIS — R278 Other lack of coordination: Secondary | ICD-10-CM | POA: Diagnosis not present

## 2016-09-30 DIAGNOSIS — M1612 Unilateral primary osteoarthritis, left hip: Secondary | ICD-10-CM | POA: Diagnosis not present

## 2016-09-30 DIAGNOSIS — R2681 Unsteadiness on feet: Secondary | ICD-10-CM | POA: Diagnosis not present

## 2016-10-03 DIAGNOSIS — R2681 Unsteadiness on feet: Secondary | ICD-10-CM | POA: Diagnosis not present

## 2016-10-03 DIAGNOSIS — M1612 Unilateral primary osteoarthritis, left hip: Secondary | ICD-10-CM | POA: Diagnosis not present

## 2016-10-03 DIAGNOSIS — R278 Other lack of coordination: Secondary | ICD-10-CM | POA: Diagnosis not present

## 2016-10-03 DIAGNOSIS — M48061 Spinal stenosis, lumbar region without neurogenic claudication: Secondary | ICD-10-CM | POA: Diagnosis not present

## 2016-10-03 DIAGNOSIS — R2689 Other abnormalities of gait and mobility: Secondary | ICD-10-CM | POA: Diagnosis not present

## 2016-10-04 ENCOUNTER — Encounter: Payer: Self-pay | Admitting: Internal Medicine

## 2016-10-04 ENCOUNTER — Telehealth: Payer: Self-pay

## 2016-10-04 DIAGNOSIS — Z79899 Other long term (current) drug therapy: Secondary | ICD-10-CM | POA: Diagnosis not present

## 2016-10-04 DIAGNOSIS — D649 Anemia, unspecified: Secondary | ICD-10-CM | POA: Diagnosis not present

## 2016-10-04 DIAGNOSIS — R319 Hematuria, unspecified: Secondary | ICD-10-CM | POA: Diagnosis not present

## 2016-10-04 DIAGNOSIS — R509 Fever, unspecified: Secondary | ICD-10-CM | POA: Diagnosis not present

## 2016-10-04 DIAGNOSIS — N390 Urinary tract infection, site not specified: Secondary | ICD-10-CM | POA: Diagnosis not present

## 2016-10-04 LAB — BASIC METABOLIC PANEL
BUN: 12 mg/dL (ref 4–21)
CREATININE: 0.5 mg/dL (ref 0.5–1.1)
Glucose: 113 mg/dL
Potassium: 3.1 mmol/L — AB (ref 3.4–5.3)
SODIUM: 136 mmol/L — AB (ref 137–147)

## 2016-10-04 LAB — CBC AND DIFFERENTIAL
HEMATOCRIT: 33 % — AB (ref 36–46)
Hemoglobin: 11.3 g/dL — AB (ref 12.0–16.0)
PLATELETS: 215 10*3/uL (ref 150–399)
WBC: 5.8 10^3/mL

## 2016-10-04 LAB — HEPATIC FUNCTION PANEL
ALK PHOS: 94 U/L (ref 25–125)
ALT: 16 U/L (ref 7–35)
AST: 31 U/L (ref 13–35)
Bilirubin, Total: 0.6 mg/dL

## 2016-10-04 NOTE — Telephone Encounter (Signed)
Recommend she be seen asap next week b/c schedule now full today.  For now, obtain properly I/O cath by nurse, cbc with diff and cmp at [facility].  I wish she'd called earlier when I had an appt.

## 2016-10-04 NOTE — Telephone Encounter (Signed)
Order faxed to Maudie Mercury at [facility]

## 2016-10-04 NOTE — Telephone Encounter (Signed)
Patient called c/o low-grade fever at night x 3 nights. Patient states her temp the last 3 nights were 98.8, 99.2, and 99.1. In the morning temp is 97.0,97.1,97.4.  Patient denies any other symptoms other than her usual aches and pains.  Patient called her urologist and was told to call her PCP  Please advise

## 2016-10-07 ENCOUNTER — Ambulatory Visit: Payer: Medicare Other | Admitting: Internal Medicine

## 2016-10-07 ENCOUNTER — Encounter (INDEPENDENT_AMBULATORY_CARE_PROVIDER_SITE_OTHER): Payer: Medicare Other | Admitting: Ophthalmology

## 2016-10-07 DIAGNOSIS — H43813 Vitreous degeneration, bilateral: Secondary | ICD-10-CM

## 2016-10-07 DIAGNOSIS — D3132 Benign neoplasm of left choroid: Secondary | ICD-10-CM

## 2016-10-07 DIAGNOSIS — H353122 Nonexudative age-related macular degeneration, left eye, intermediate dry stage: Secondary | ICD-10-CM

## 2016-10-07 DIAGNOSIS — H353211 Exudative age-related macular degeneration, right eye, with active choroidal neovascularization: Secondary | ICD-10-CM | POA: Diagnosis not present

## 2016-10-07 DIAGNOSIS — H2513 Age-related nuclear cataract, bilateral: Secondary | ICD-10-CM | POA: Diagnosis not present

## 2016-10-10 ENCOUNTER — Encounter: Payer: Self-pay | Admitting: Internal Medicine

## 2016-10-10 ENCOUNTER — Ambulatory Visit (INDEPENDENT_AMBULATORY_CARE_PROVIDER_SITE_OTHER): Payer: Medicare Other | Admitting: Internal Medicine

## 2016-10-10 VITALS — BP 122/70 | HR 70 | Temp 97.9°F | Wt 98.0 lb

## 2016-10-10 DIAGNOSIS — R278 Other lack of coordination: Secondary | ICD-10-CM | POA: Diagnosis not present

## 2016-10-10 DIAGNOSIS — R339 Retention of urine, unspecified: Secondary | ICD-10-CM | POA: Diagnosis not present

## 2016-10-10 DIAGNOSIS — K648 Other hemorrhoids: Secondary | ICD-10-CM | POA: Diagnosis not present

## 2016-10-10 DIAGNOSIS — H04123 Dry eye syndrome of bilateral lacrimal glands: Secondary | ICD-10-CM

## 2016-10-10 DIAGNOSIS — K5904 Chronic idiopathic constipation: Secondary | ICD-10-CM

## 2016-10-10 DIAGNOSIS — F419 Anxiety disorder, unspecified: Secondary | ICD-10-CM

## 2016-10-10 DIAGNOSIS — N368 Other specified disorders of urethra: Secondary | ICD-10-CM

## 2016-10-10 DIAGNOSIS — M1612 Unilateral primary osteoarthritis, left hip: Secondary | ICD-10-CM | POA: Diagnosis not present

## 2016-10-10 DIAGNOSIS — R2681 Unsteadiness on feet: Secondary | ICD-10-CM | POA: Diagnosis not present

## 2016-10-10 DIAGNOSIS — R2689 Other abnormalities of gait and mobility: Secondary | ICD-10-CM | POA: Diagnosis not present

## 2016-10-10 DIAGNOSIS — M48061 Spinal stenosis, lumbar region without neurogenic claudication: Secondary | ICD-10-CM | POA: Diagnosis not present

## 2016-10-10 MED ORDER — ALPRAZOLAM 0.25 MG PO TABS: ORAL_TABLET | ORAL | 0 refills | Status: DC

## 2016-10-10 NOTE — Progress Notes (Signed)
Location:  [facility] clinic Provider: Aquilla Shambley L. Mariea Clonts, D.O., C.M.D.  Code Status: DNR Goals of Care:  Advanced Directives 10/10/2016  Does Patient Have a Medical Advance Directive? Yes  Type of Paramedic of [place];Living will;Out of facility DNR (pink MOST or yellow form)  Does patient want to make changes to medical advance directive? -  Copy of [place] in Chart? Yes  Pre-existing out of facility DNR order (yellow form or pink MOST form) Yellow form placed in chart (order not valid for inpatient use);Pink MOST form placed in chart (order not valid for inpatient use)   Chief Complaint  Patient presents with  . Acute Visit    fevers    HPI: Patient is a 83 y.o. female seen today for an acute visit for low grade fevers on Thursday and Friday of last week.  She had labs and a UA c+s.  Culture only grew out 30K of bacteria, I believe E coli, but report is not yet scanned in epic.  She has her chronic urinary issues and is now getting caths bid by home care nursing.   Reports her usual difficulties with getting up at night to urinate despite twice a day I/O caths.  She is very anxious and has apparently dropped a few xanax on the floor that she cannot see to find so will refill a little early.  Says she has not taken any extra.  She continues to c/o constipation difficulty but does move her bowels around mid-day with fiber and water intake plus walking or nustep exercise.  Discussed moving to assisted living--listed many reasons for her.  Director is visiting her place today to see if her furniture will fit and computer.  Past Medical History:  Diagnosis Date  . Anemia, unspecified   . Asymptomatic varicose veins   . Carotid bruit    hx of  . Carotid bruit    doppler normal in the past  . Diverticulosis   . Dyslipidemia   . Esophageal reflux   . Female bladder prolapse, acquired 07/17/2010  . Fibroid   . Full incontinence of feces   .  Hemorrhoid    1963 surgical excision  . History of colon cancer    Adenocarcinoma of right colon, stage T2, N1.s/p resection/chemotherapy 2002  . HTN (hypertension)   . Hx of colonic polyps    1965 surgical excision  . Hypothyroidism   . Idiopathic osteoporosis   . Idiopathic osteoporosis   . Internal hemorrhoids without mention of complication    bleed easily  . Irritable bowel syndrome   . Melanoma of skin, site unspecified   . MVP (mitral valve prolapse)   . OA (osteoarthritis)    left hip  . Osteoarthrosis, unspecified whether generalized or localized, pelvic region and thigh   . Peripheral neuropathy    hands/ feet  . 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 Spinal stenosis, unspecified region other than cervical   . Vaginal bleeding, abnormal   . Vaginitis, atrophic   . Varicose veins     Past Surgical History:  Procedure Laterality Date  . COLONOSCOPY W/ POLYPECTOMY  2006   3 mm polyp destroyed, diverticulosis  . DILATION AND CURETTAGE OF UTERUS    . ESOPHAGOGASTRODUODENOSCOPY  2009   mild gastritis  . HEMICOLECTOMY  2002   right  . [place]  . HYSTEROSCOPY    . MELANOMA EXCISION      Allergies  Allergen Reactions  .  Amoxil [Amoxicillin] Diarrhea    Has patient had a PCN reaction causing immediate rash, facial/tongue/throat swelling, SOB or lightheadedness with hypotension: no Has patient had a PCN reaction causing severe rash involving mucus membranes or skin necrosis: no Has patient had a PCN reaction that required hospitalization : no Has patient had a PCN reaction occurring within the last 10 years: no If all of the above answers are "NO", then may proceed with Cephalosporin use.   Otho Darner Allergy]     sensitivity  . Demerol     unknown  . Meperidine Hcl Other (See Comments)    Drop in blood pressure  . Infed [Iron Dextran] Rash    Became very red over face, scalp, and arms  . Neosporin [Neomycin-Bacitracin Zn-Polymyx] Rash     unknown    Allergies as of 10/10/2016      Reactions   Amoxil [amoxicillin] Diarrhea   Has patient had a PCN reaction causing immediate rash, facial/tongue/throat swelling, SOB or lightheadedness with hypotension: no Has patient had a PCN reaction causing severe rash involving mucus membranes or skin necrosis: no Has patient had a PCN reaction that required hospitalization : no Has patient had a PCN reaction occurring within the last 10 years: no If all of the above answers are "NO", then may proceed with Cephalosporin use.   Crab [shellfish Allergy]    sensitivity   Demerol    unknown   Meperidine Hcl Other (See Comments)   Drop in blood pressure   Infed [iron Dextran] Rash   Became very red over face, scalp, and arms   Neosporin [neomycin-bacitracin Zn-polymyx] Rash   unknown      Medication List       Accurate as of 10/10/16  8:26 AM. Always use your most recent med list.          ALIGN 4 MG Caps Take 1 tablet by mouth daily.   ALPRAZolam 0.25 MG tablet Commonly known as:  XANAX TAKE 1 TABLET AT BEDTIME AS NEEDED FOR ANXIETY.   antiseptic oral rinse Liqd 15 mLs by Mouth Rinse route as needed for dry mouth.   BESIVANCE 0.6 % Susp Generic drug:  Besifloxacin HCl   cholecalciferol 1000 units tablet Commonly known as:  VITAMIN D Take 1 tablet (1,000 Units total) by mouth daily.   CITRUCEL FIBERSHAKE oral powder Generic drug:  methylcellulose Take 1 packet by mouth daily.   fluticasone 50 MCG/ACT nasal spray Commonly known as:  FLONASE Place 1 spray into both nostrils daily.   hydrocortisone 2.5 % rectal cream Commonly known as:  ANUSOL-HC Place 1 application rectally as needed for hemorrhoids or itching.   hydrocortisone 25 MG suppository Commonly known as:  ANUSOL-HC Place 25 mg rectally 2 (two) times daily as needed for hemorrhoids or itching.   levothyroxine 75 MCG tablet Commonly known as:  SYNTHROID, LEVOTHROID TAKE 1 TABLET DAILY BEFORE BREAKFAST  FOR THYROID.   methenamine 1 g tablet Commonly known as:  HIPREX Take 1 g by mouth at bedtime.   ranitidine 150 MG tablet Commonly known as:  ZANTAC Take 150 mg by mouth 2 (two) times daily as needed for heartburn.   TUMS 500 MG chewable tablet Generic drug:  calcium carbonate Chew 1 tablet by mouth daily as needed for indigestion (indigestion).   URIBEL PO Take 1 capsule by mouth daily.       Review of Systems:  Review of Systems  Constitutional: Positive for diaphoresis. Negative for chills and fever.  HENT: Positive for hearing loss.        Hearing worse today  Eyes: Positive for blurred vision.       Dry eyes  Respiratory: Negative for shortness of breath.   Cardiovascular: Negative for chest pain and palpitations.  Gastrointestinal: Positive for constipation. Negative for abdominal pain, blood in stool, melena, nausea and vomiting.       No recent hemorrhoidal bleeding  Genitourinary: Positive for dysuria and frequency.       Retention with I/O cath bid  Musculoskeletal: Negative for falls and joint pain.  Skin: Negative for itching and rash.  Neurological: Positive for weakness. Negative for dizziness and loss of consciousness.  Psychiatric/Behavioral: Negative for memory loss. The patient is nervous/anxious.     Health Maintenance  Topic Date Due  . INFLUENZA VACCINE  01/15/2017  . TETANUS/TDAP  10/08/2021  . DEXA SCAN  Completed  . PNA vac Low Risk Adult  Completed    Physical Exam: Vitals:   10/10/16 0823  BP: 122/70  Pulse: 70  Temp: 97.9 F (36.6 C)  TempSrc: Oral  SpO2: 96%  Weight: 98 lb (44.5 kg)   Body mass index is 21.97 kg/m. Physical Exam  Constitutional: She is oriented to person, place, and time. No distress.  HENT:  Head: Normocephalic and atraumatic.  Cardiovascular: Normal rate, regular rhythm, normal heart sounds and intact distal pulses.   Pulmonary/Chest: Effort normal and breath sounds normal. No respiratory distress.    Abdominal: Soft. Bowel sounds are normal. She exhibits no distension. There is no tenderness.  Musculoskeletal:  Left foot drop wearing brace, walks with walker, right hip pain now better with exercise  Neurological: She is alert and oriented to person, place, and time.  Skin: Skin is warm and dry. Capillary refill takes less than 2 seconds.  Skin tag left arm  Psychiatric:  Anxious and jittery today    Labs reviewed: Basic Metabolic Panel:  Recent Labs  11/07/15 0700 12/01/15 1937 10/04/16  NA 142 125* 136*  K 4.0 3.2* 3.1*  CL  --  90*  --   CO2  --  24  --   GLUCOSE  --  127*  --   BUN 11 12 12   CREATININE 0.5 0.51 0.5  CALCIUM  --  8.3*  --    Liver Function Tests:  Recent Labs  12/01/15 1937 10/04/16  AST 37 31  ALT 21 16  ALKPHOS 75 94  BILITOT 1.4*  --   PROT 6.6  --   ALBUMIN 3.3*  --    No results for input(s): LIPASE, AMYLASE in the last 8760 hours. No results for input(s): AMMONIA in the last 8760 hours. CBC:  Recent Labs  11/07/15 0700 12/01/15 1937 10/04/16  WBC 4.8 11.8* 5.8  NEUTROABS  --  10.5*  --   HGB 12.6 10.5* 11.3*  HCT 36 30.6* 33*  MCV  --  89.5  --   PLT 203 181 215   Lipid Panel:  Recent Labs  11/07/15 0700  CHOL 174  HDL 79*  LDLCALC 78  TRIG 87   Lab Results  Component Value Date   HGBA1C 5.9 12/02/2006    Assessment/Plan 1. Urinary retention -ongoing, follows with urology, getting I/O caths bid -on chronic abx thru urology  2. Prolapsed urethral mucosa -chronic issue with dysuria, frequency, urge incontinence  3. Chronic idiopathic constipation -cont fiber, water, exercise  4. Anxiety -xanax called in today  5. Hemorrhoids, internal, with bleeding and prolapse -  no bleeding recently, bowels moving daily with fiber, water, exercise  6. Bilateral dry eyes -cont drops, needs help with these   Labs/tests ordered:  No orders of the defined types were placed in this encounter.  Next appt:   11/27/2016  Dary Dilauro L. Russell Engelstad, D.O. [facility] [address], [place] [address] Cell Phone (Mon-Fri 8am-5pm):  [phone] On Call:  [phone] & follow prompts after 5pm & weekends Office Phone:  [phone] Office Fax:  [phone]

## 2016-10-10 NOTE — Patient Instructions (Signed)
I recommend you move to assisted living for the following reasons: 1)  They can help you with your catheterization twice a day. 2)   They can give you your medications and keep track of changes in them. 3)  I can see you in your room when the clinic is full so you won't have to ride over to the main office. 4) They can put your eye drops in and you won't have to walk over to the clinic 5) We can keep track of your vital signs.  6)  We can help keep track of your bowel movements so you don't have to stress over it. 7) There is more help available if you are unable to do your daily routine on your own. AND MORE!

## 2016-10-11 ENCOUNTER — Encounter: Payer: Self-pay | Admitting: Internal Medicine

## 2016-10-29 ENCOUNTER — Encounter (HOSPITAL_COMMUNITY): Payer: Self-pay | Admitting: *Deleted

## 2016-10-29 ENCOUNTER — Emergency Department (HOSPITAL_COMMUNITY)
Admission: EM | Admit: 2016-10-29 | Discharge: 2016-10-29 | Disposition: A | Discharge status: Home / Self Care | Payer: Medicare Other | Attending: Emergency Medicine | Admitting: Emergency Medicine

## 2016-10-29 DIAGNOSIS — Z85820 Personal history of malignant melanoma of skin: Secondary | ICD-10-CM | POA: Insufficient documentation

## 2016-10-29 DIAGNOSIS — Z79899 Other long term (current) drug therapy: Secondary | ICD-10-CM | POA: Insufficient documentation

## 2016-10-29 DIAGNOSIS — R410 Disorientation, unspecified: Secondary | ICD-10-CM | POA: Diagnosis not present

## 2016-10-29 DIAGNOSIS — E039 Hypothyroidism, unspecified: Secondary | ICD-10-CM | POA: Diagnosis not present

## 2016-10-29 DIAGNOSIS — Z85038 Personal history of other malignant neoplasm of large intestine: Secondary | ICD-10-CM | POA: Diagnosis not present

## 2016-10-29 DIAGNOSIS — Z87891 Personal history of nicotine dependence: Secondary | ICD-10-CM | POA: Insufficient documentation

## 2016-10-29 DIAGNOSIS — T83511A Infection and inflammatory reaction due to indwelling urethral catheter, initial encounter: Secondary | ICD-10-CM

## 2016-10-29 DIAGNOSIS — N390 Urinary tract infection, site not specified: Secondary | ICD-10-CM | POA: Diagnosis not present

## 2016-10-29 DIAGNOSIS — F22 Delusional disorders: Secondary | ICD-10-CM | POA: Diagnosis not present

## 2016-10-29 DIAGNOSIS — E876 Hypokalemia: Secondary | ICD-10-CM | POA: Diagnosis not present

## 2016-10-29 DIAGNOSIS — R4182 Altered mental status, unspecified: Secondary | ICD-10-CM | POA: Diagnosis not present

## 2016-10-29 DIAGNOSIS — Z85528 Personal history of other malignant neoplasm of kidney: Secondary | ICD-10-CM | POA: Insufficient documentation

## 2016-10-29 DIAGNOSIS — B999 Unspecified infectious disease: Secondary | ICD-10-CM | POA: Diagnosis not present

## 2016-10-29 DIAGNOSIS — I10 Essential (primary) hypertension: Secondary | ICD-10-CM | POA: Diagnosis not present

## 2016-10-29 DIAGNOSIS — R030 Elevated blood-pressure reading, without diagnosis of hypertension: Secondary | ICD-10-CM | POA: Diagnosis not present

## 2016-10-29 LAB — CBC WITH DIFFERENTIAL/PLATELET
BASOS PCT: 0 %
Basophils Absolute: 0 10*3/uL (ref 0.0–0.1)
Eosinophils Absolute: 0.1 10*3/uL (ref 0.0–0.7)
Eosinophils Relative: 1 %
HEMATOCRIT: 33.4 % — AB (ref 36.0–46.0)
HEMOGLOBIN: 11 g/dL — AB (ref 12.0–15.0)
LYMPHS ABS: 1 10*3/uL (ref 0.7–4.0)
Lymphocytes Relative: 22 %
MCH: 30.1 pg (ref 26.0–34.0)
MCHC: 32.9 g/dL (ref 30.0–36.0)
MCV: 91.3 fL (ref 78.0–100.0)
MONO ABS: 0.4 10*3/uL (ref 0.1–1.0)
MONOS PCT: 10 %
NEUTROS ABS: 2.9 10*3/uL (ref 1.7–7.7)
NEUTROS PCT: 67 %
Platelets: 215 10*3/uL (ref 150–400)
RBC: 3.66 MIL/uL — ABNORMAL LOW (ref 3.87–5.11)
RDW: 14.4 % (ref 11.5–15.5)
WBC: 4.4 10*3/uL (ref 4.0–10.5)

## 2016-10-29 LAB — URINALYSIS, ROUTINE W REFLEX MICROSCOPIC
Bilirubin Urine: NEGATIVE
Glucose, UA: NEGATIVE mg/dL
Ketones, ur: NEGATIVE mg/dL
Nitrite: POSITIVE — AB
PROTEIN: NEGATIVE mg/dL
SPECIFIC GRAVITY, URINE: 1.01 (ref 1.005–1.030)
pH: 7 (ref 5.0–8.0)

## 2016-10-29 LAB — COMPREHENSIVE METABOLIC PANEL
ALBUMIN: 3.3 g/dL — AB (ref 3.5–5.0)
ALK PHOS: 79 U/L (ref 38–126)
ALT: 18 U/L (ref 14–54)
ANION GAP: 9 (ref 5–15)
AST: 39 U/L (ref 15–41)
BUN: 13 mg/dL (ref 6–20)
CALCIUM: 8.7 mg/dL — AB (ref 8.9–10.3)
CHLORIDE: 99 mmol/L — AB (ref 101–111)
CO2: 28 mmol/L (ref 22–32)
CREATININE: 0.58 mg/dL (ref 0.44–1.00)
GFR calc Af Amer: >60 mL/min (ref >60)
GFR calc non Af Amer: >60 mL/min (ref >60)
GLUCOSE: 95 mg/dL (ref 65–99)
Potassium: 3.1 mmol/L — ABNORMAL LOW (ref 3.5–5.1)
SODIUM: 136 mmol/L (ref 135–145)
Total Bilirubin: 0.8 mg/dL (ref 0.3–1.2)
Total Protein: 6.7 g/dL (ref 6.5–8.1)

## 2016-10-29 MED ORDER — POTASSIUM CHLORIDE ER 20 MEQ PO TBCR: 20.0000 meq | EXTENDED_RELEASE_TABLET | Freq: Two times a day (BID) | ORAL | 0 refills | Status: DC

## 2016-10-29 MED ORDER — POTASSIUM CHLORIDE CRYS ER 20 MEQ PO TBCR
40.0000 meq | EXTENDED_RELEASE_TABLET | Freq: Once | ORAL | Status: AC
  Administered 2016-10-29: 40 meq via ORAL
  Filled 2016-10-29: qty 2

## 2016-10-29 MED ORDER — CIPROFLOXACIN HCL 500 MG PO TABS: 500.0000 mg | ORAL_TABLET | Freq: Two times a day (BID) | ORAL | 0 refills | Status: DC

## 2016-10-29 NOTE — ED Notes (Signed)
PER BERNADETTE AT [facility], THE PT NEEDS TO BE PLACED IN THE REHAB DEPARTMENT FOR SAFETY. ERA MADE AWARE.

## 2016-10-29 NOTE — ED Provider Notes (Signed)
Pt seen and evaluated.  Discussed with PA. Pt awake and alert. Dementia noted. VSS. Benign abdominal exam. +UA. BMP without acute changes x Hypokalemia.  Agree with DC to SNF. Rx PO Cipro based onprior culprits being sensitive. PO K+CL- x 7 days.   Tanna Furry, MD 10/29/16 [phone]

## 2016-10-29 NOTE — ED Provider Notes (Signed)
[facility] DEPT Provider Note   CSN: [id number] Arrival date & time: 10/29/16  0854     History   Chief Complaint Chief Complaint  [name] Small presents with  . Altered Mental Status    HPI [name] Small is a 83 y.o. female with a medical history as listed below, who presents today with a chief complaint of being altered this morning. [name] Small lives at independent living [facility]. [name] Small is unsure why [name] Small is here, states that [name] Small had a bad dream that [name] Small was in an abyss. [name] Small denies headache, chest pain, shortness of breath, abdominal pain, pain with urination or extremity pain. Denies recent falls or trauma. [name] Small has no complaints today. No nausea/vomiting or fevers at home.  HPI  Past Medical History:  Diagnosis Date  . Anemia, unspecified   . Asymptomatic varicose veins   . Carotid bruit    hx of  . Carotid bruit    doppler normal in the past  . Diverticulosis   . Dyslipidemia   . Esophageal reflux   . Female bladder prolapse, acquired 07/17/2010  . Fibroid   . Full incontinence of feces   . Hemorrhoid    1963 surgical excision  . History of colon cancer    Adenocarcinoma of right colon, stage T2, N1.s/p resection/chemotherapy 2002  . HTN (hypertension)   . Hx of colonic polyps    1965 surgical excision  . Hypothyroidism   . Idiopathic osteoporosis   . Idiopathic osteoporosis   . Internal hemorrhoids without mention of complication    bleed easily  . Irritable bowel syndrome   . Melanoma of skin, site unspecified   . MVP (mitral valve prolapse)   . OA (osteoarthritis)    left hip  . Osteoarthrosis, unspecified whether generalized or localized, pelvic region and thigh   . Peripheral neuropathy    hands/ feet  . 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 Spinal stenosis, unspecified region other than cervical   . Vaginal bleeding, abnormal   . Vaginitis, atrophic   . Varicose veins     [name] Small Active Problem List   Diagnosis Date Noted  . Bleeding  internal hemorrhoids 09/08/2015  . Urinary retention 09/08/2015  . Excessive flatus 09/08/2015  . Atrophic vaginitis 07/26/2014  . Prolapsed urethral mucosa 07/26/2014  . Essential hypertension 07/26/2014  . Renal mass, left 07/26/2014  . Elevated liver enzymes 04/04/2014  . Renal cancer ([place]) 04/04/2014  . Colon cancer ([place]) 10/12/2013  . History of colon cancer   . Bilateral dry eyes 10/27/2012  . Dry mouth 10/27/2012  . Advanced care planning/counseling discussion 07/15/2012  . Knee pain, bilateral 10/09/2011  . HTN (hypertension)   . Carotid bruit   . PERSONAL HX COLONIC POLYPS 08/08/2010  . Female bladder prolapse, acquired 07/17/2010  . VAGINAL BLEEDING, ABNORMAL 06/28/2010  . Osteoarthritis of left hip 06/28/2010  . Full incontinence of feces 03/23/2010  . VARICOSE VEINS, LOWER EXTREMITIES 01/06/2009  . Anemia 12/03/2007  . VAGINITIS, ATROPHIC 12/03/2007  . Senile osteoporosis 12/03/2007  . Melanoma of skin, site unspecified 07/13/2007  . Hemorrhoids, internal, with bleeding and prolapse 06/12/2007  . Irritable bowel syndrome 06/12/2007  . Hypothyroidism 02/25/2007  . Hyperlipidemia 02/25/2007  . Hereditary and idiopathic peripheral neuropathy 02/25/2007  . GASTROESOPHAGEAL REFLUX DISEASE 02/25/2007  . Spinal stenosis of lumbar region 02/25/2007  . History of malignant neoplasm of large intestine 02/25/2007  . DIVERTICULOSIS, COLON, HX OF 02/25/2007    Past Surgical History:  Procedure Laterality Date  .  COLONOSCOPY W/ POLYPECTOMY  2006   3 mm polyp destroyed, diverticulosis  . DILATION AND CURETTAGE OF UTERUS    . ESOPHAGOGASTRODUODENOSCOPY  2009   mild gastritis  . HEMICOLECTOMY  2002   right  . [place]  . HYSTEROSCOPY    . MELANOMA EXCISION      OB History    Gravida Para Term Preterm AB Living   2 2 2     2    SAB TAB Ectopic Multiple Live Births                   Home Medications    Prior to Admission medications   Medication  Sig Start Date End Date Taking? Authorizing Provider  ALPRAZolam Duanne Moron) 0.25 MG tablet TAKE 1 TABLET AT BEDTIME AS NEEDED FOR ANXIETY. 10/10/16   Reed, Tiffany L, DO  antiseptic oral rinse (BIOTENE) LIQD 15 mLs by Mouth Rinse route as needed for dry mouth. 06/04/13   Norins, Heinz Knuckles, MD  BESIVANCE 0.6 % SUSP  10/07/16   [provider]  calcium carbonate (TUMS) 500 MG chewable tablet Chew 1 tablet by mouth daily as needed for indigestion (indigestion).     [provider]  cholecalciferol (VITAMIN D) 1000 UNITS tablet Take 1 tablet (1,000 Units total) by mouth daily. 05/03/15   Reed, Tiffany L, DO  ciprofloxacin (CIPRO) 500 MG tablet Take 1 tablet (500 mg total) by mouth 2 (two) times daily. 10/29/16 11/05/16  Lorin Glass, PA-C  fluticasone [facility]) 50 MCG/ACT nasal spray Place 1 spray into both nostrils daily.    [provider]  hydrocortisone (ANUSOL-HC) 2.5 % rectal cream Place 1 application rectally as needed for hemorrhoids or itching.    [provider]  hydrocortisone (ANUSOL-HC) 25 MG suppository Place 25 mg rectally 2 (two) times daily as needed for hemorrhoids or itching.    [provider]  levothyroxine (SYNTHROID, LEVOTHROID) 75 MCG tablet TAKE 1 TABLET DAILY BEFORE BREAKFAST FOR THYROID. 09/06/16   Reed, Tiffany L, DO  Meth-Hyo-M Bl-Na Phos-Ph Sal (URIBEL PO) Take 1 capsule by mouth daily.     [provider]  methenamine (HIPREX) 1 g tablet Take 1 g by mouth at bedtime.    [provider]  methylcellulose (CITRUCEL FIBERSHAKE) oral powder Take 1 packet by mouth daily.    [provider]  Potassium Chloride ER 20 MEQ TBCR Take 20 mEq by mouth 2 (two) times daily. 10/29/16 11/05/16  Lorin Glass, PA-C  Probiotic Product (ALIGN) 4 MG CAPS Take 1 tablet by mouth daily.      [provider]  ranitidine (ZANTAC) 150 MG tablet Take 150 mg by mouth 2 (two) times daily as needed for heartburn.      [provider]    Family History Family History  Problem Relation Age of Onset  . Heart disease Father   . Diabetes Father   . Heart disease Sister   . Stomach cancer Maternal Grandfather   . Colon cancer Neg Hx     Social History Social History  Substance Use Topics  . Smoking status: Former Smoker    Years: 2.00    Types: Cigarettes    Quit date: 09/09/1943  . Smokeless tobacco: Never Used  . Alcohol use 0.0 oz/week     Comment: occas wine 2 glasses a week     Allergies   Amoxil [amoxicillin]; Crab [shellfish allergy]; Demerol; Meperidine hcl; Infed [iron dextran]; and Neosporin [neomycin-bacitracin zn-polymyx]  Review of Systems Review of Systems  Constitutional: Negative for activity change, appetite change, fatigue and fever.  HENT: Negative for congestion and sore throat.   Eyes: Negative for visual disturbance.  Respiratory: Negative for cough and chest tightness.   Cardiovascular: Negative for chest pain.  Gastrointestinal: Negative for abdominal pain and constipation.  Genitourinary: Negative for dysuria, flank pain and urgency.  Musculoskeletal: Negative for arthralgias and myalgias.  Skin: Negative for rash.  Neurological: Negative for numbness and headaches.  Psychiatric/Behavioral: Positive for confusion. Negative for agitation.     Physical Exam Updated Vital Signs BP (!) 146/77 (BP Location: Right Arm)   Pulse 78   Temp 97.7 F (36.5 C) (Oral)   Resp 20   Ht 4\' 8"  (1.422 m)   Wt 43.5 kg   SpO2 98%   BMI 21.52 kg/m   Physical Exam  Constitutional: [name] Small is oriented to person, place, and time. [name] Small appears well-developed and well-nourished. No distress.  HENT:  Head: Normocephalic and atraumatic.  Mouth/Throat: No oropharyngeal exudate.  Eyes: Conjunctivae are normal. Pupils are equal, round, and reactive to light. Right eye exhibits no discharge. Left eye exhibits no discharge. No scleral icterus.  Neck: Normal range of motion.  Neck supple.  Cardiovascular: Normal rate, intact distal pulses and normal pulses.  An irregular rhythm present.  Murmur heard. Cardiac murmur best heard on the right side of the chest, lateral to the manubrium  Pulmonary/Chest: Effort normal and breath sounds normal. No stridor. No respiratory distress. [name] Small has no wheezes. [name] Small has no rales. [name] Small exhibits no tenderness.  Abdominal: Soft. Bowel sounds are normal. [name] Small exhibits no distension. There is no tenderness.  Musculoskeletal: [name] Small exhibits no edema or deformity.  Neurological: [name] Small is alert and oriented to person, place, and time. [name] Small exhibits normal muscle tone.  Skin: Skin is warm and dry. [name] Small is not diaphoretic.  Psychiatric: [name] Small has a normal mood and affect. [name] Small behavior is normal.  Nursing note and vitals reviewed.    ED Treatments / Results  Labs (all labs ordered are listed, but only abnormal results are displayed) Labs Reviewed  URINALYSIS, ROUTINE W REFLEX MICROSCOPIC - Abnormal; Notable for the following:       Result Value   Color, Urine GREEN (*)    APPearance HAZY (*)    Hgb urine dipstick SMALL (*)    Nitrite POSITIVE (*)    Leukocytes, UA SMALL (*)    Bacteria, UA MANY (*)    Squamous Epithelial / LPF 0-5 (*)    All other components within normal limits  COMPREHENSIVE METABOLIC PANEL - Abnormal; Notable for the following:    Potassium 3.1 (*)    Chloride 99 (*)    Calcium 8.7 (*)    Albumin 3.3 (*)    All other components within normal limits  CBC WITH DIFFERENTIAL/PLATELET - Abnormal; Notable for the following:    RBC 3.66 (*)    Hemoglobin 11.0 (*)    HCT 33.4 (*)    All other components within normal limits  URINE CULTURE    EKG  EKG Interpretation  Date/Time:  Tuesday Oct 29 2016 10:01:51 EDT Ventricular Rate:  72 PR Interval:    QRS Duration: 110 QT Interval:  422 QTC Calculation: 462 R Axis:   -55 Text Interpretation:  Sinus rhythm Left anterior fascicular block Left ventricular hypertrophy  Confirmed by Jeneen Rinks  MD, [place] ([id number]) on 10/29/2016 10:04:15 AM       Radiology No results found.  Procedures Procedures (  including critical care time)  Medications Ordered in ED Medications  potassium chloride SA (K-DUR,KLOR-CON) CR tablet 40 mEq (not administered)     Initial Impression / Assessment and Plan / ED Course  I have reviewed the triage vital signs and the nursing notes.  Pertinent labs & imaging results that were available during my care of the [name] Small were reviewed by me and considered in my medical decision making (see chart for details).  Clinical Course as of Oct 29 1413  Tue Oct 29, 2016  1404 Explained results to [name] Small. No questions.  [EH]    Clinical Course User Index [EH] Lorin Glass, PA-C   Pt has been diagnosed with a UTI. Pt is afebrile, no CVA tenderness, normotensive, and denies N/V. Pt to be dc home with antibiotics and instructions to follow up with PCP if symptoms persist.  [name] Small was given Ciprofloxacin for antibiotics due to [name] Small history of catheter use and UTIs caused by Pseudomonas. Previous cultures were reviewed and found sensitive to Cipro.  [name] Small was noted to to be hypokalemic, will provide potassium supplementation throughout the duration of [name] Small Cipro treatment.  Suspect that UTI along with probable undiagnosed dementia are responsible for the [name] Small's altered mental status this morning.  This [name] Small was discussed with Dr. Jeneen Rinks who saw the [name] Small and agrees with my plan.  Final Clinical Impressions(s) / ED Diagnoses   Final diagnoses:  Confusion  Urinary tract infection associated with catheterization of urinary tract, unspecified indwelling urinary catheter type, initial encounter ([place])  Hypokalemia    New Prescriptions Current Discharge Medication List    START taking these medications   Details  ciprofloxacin (CIPRO) 500 MG tablet Take 1 tablet (500 mg total) by mouth 2 (two) times daily. Qty: 14 tablet, Refills: 0      Potassium Chloride ER 20 MEQ TBCR Take 20 mEq by mouth 2 (two) times daily. Qty: 14 tablet, Refills: 0         Ollen Gross 10/29/16 1416    Tanna Furry, MD 11/01/16 1622

## 2016-10-29 NOTE — ED Notes (Signed)
PT DISCHARGED. REPORT GIVEN TO [place], FROM  [facility]. INSTRUCTIONS AND PRESCRIPTIONS GIVEN TO PTAR STAFF. AAOX3. PT IN NO APPARENT DISTRESS OR PAIN. THE OPPORTUNITY TO ASK QUESTIONS WAS PROVIDED.

## 2016-10-29 NOTE — ED Notes (Signed)
rn will collect labs

## 2016-10-29 NOTE — ED Triage Notes (Signed)
Per EMS, Pt, from [facility], presents after waking up altered.  Pt currently A & Ox4 w/ no complaints.  Hx of recurrent UTIs and indwelling foley.   Pt has Home Health resources.

## 2016-10-29 NOTE — ED Notes (Signed)
Bed: [facility] Expected date:  Expected time:  Means of arrival:  Comments: No Bed 

## 2016-10-29 NOTE — Progress Notes (Signed)
Pharmacy is unable to verify her home meds. Per Jeanmarie Hubert, and the patient, she manages her medications herself. She is confused and unable to verify her medications.  Call pharmacy to repeat the interview if her mental status improves.  Romeo Rabon, PharmD, pager [phone]. 10/29/2016,11:04 AM.

## 2016-10-30 ENCOUNTER — Telehealth: Payer: Self-pay

## 2016-10-30 ENCOUNTER — Encounter: Payer: Self-pay | Admitting: Gynecology

## 2016-10-30 NOTE — Telephone Encounter (Signed)
This is a patient of [facility], who was admitted to [facility] after hospitalization. [facility] F/U is needed. Hospital discharge from [facility] on 10/29/16

## 2016-10-31 ENCOUNTER — Encounter: Payer: Self-pay | Admitting: Adult Health

## 2016-10-31 ENCOUNTER — Non-Acute Institutional Stay (SKILLED_NURSING_FACILITY): Payer: Medicare Other | Admitting: Adult Health

## 2016-10-31 DIAGNOSIS — R2681 Unsteadiness on feet: Secondary | ICD-10-CM | POA: Diagnosis not present

## 2016-10-31 DIAGNOSIS — M21372 Foot drop, left foot: Secondary | ICD-10-CM | POA: Diagnosis not present

## 2016-10-31 DIAGNOSIS — R5381 Other malaise: Secondary | ICD-10-CM

## 2016-10-31 DIAGNOSIS — K219 Gastro-esophageal reflux disease without esophagitis: Secondary | ICD-10-CM

## 2016-10-31 DIAGNOSIS — I10 Essential (primary) hypertension: Secondary | ICD-10-CM | POA: Diagnosis not present

## 2016-10-31 DIAGNOSIS — R339 Retention of urine, unspecified: Secondary | ICD-10-CM | POA: Diagnosis not present

## 2016-10-31 DIAGNOSIS — T83511A Infection and inflammatory reaction due to indwelling urethral catheter, initial encounter: Secondary | ICD-10-CM

## 2016-10-31 DIAGNOSIS — M25551 Pain in right hip: Secondary | ICD-10-CM | POA: Insufficient documentation

## 2016-10-31 DIAGNOSIS — R682 Dry mouth, unspecified: Secondary | ICD-10-CM

## 2016-10-31 DIAGNOSIS — R143 Flatulence: Secondary | ICD-10-CM | POA: Diagnosis not present

## 2016-10-31 DIAGNOSIS — M48061 Spinal stenosis, lumbar region without neurogenic claudication: Secondary | ICD-10-CM | POA: Diagnosis not present

## 2016-10-31 DIAGNOSIS — M6389 Disorders of muscle in diseases classified elsewhere, multiple sites: Secondary | ICD-10-CM | POA: Diagnosis not present

## 2016-10-31 DIAGNOSIS — E039 Hypothyroidism, unspecified: Secondary | ICD-10-CM

## 2016-10-31 DIAGNOSIS — E876 Hypokalemia: Secondary | ICD-10-CM | POA: Diagnosis not present

## 2016-10-31 DIAGNOSIS — R278 Other lack of coordination: Secondary | ICD-10-CM | POA: Diagnosis not present

## 2016-10-31 DIAGNOSIS — N390 Urinary tract infection, site not specified: Secondary | ICD-10-CM

## 2016-10-31 DIAGNOSIS — R2689 Other abnormalities of gait and mobility: Secondary | ICD-10-CM | POA: Diagnosis not present

## 2016-10-31 DIAGNOSIS — Z9181 History of falling: Secondary | ICD-10-CM | POA: Diagnosis not present

## 2016-10-31 DIAGNOSIS — G609 Hereditary and idiopathic neuropathy, unspecified: Secondary | ICD-10-CM | POA: Diagnosis not present

## 2016-10-31 DIAGNOSIS — K117 Disturbances of salivary secretion: Secondary | ICD-10-CM

## 2016-10-31 DIAGNOSIS — M25559 Pain in unspecified hip: Secondary | ICD-10-CM | POA: Diagnosis not present

## 2016-10-31 LAB — URINE CULTURE

## 2016-10-31 NOTE — Progress Notes (Signed)
Location:  Occupational psychologist of Service:  SNF (31) Provider:   Cindi Carbon, ANP [place] [phone]   Gayland Curry, DO  Patient Care Team: Gayland Curry, DO as PCP - General (Geriatric Medicine) Terrance Mass, MD as Consulting Physician (Gynecology) Shon Hough, MD as Consulting Physician (Ophthalmology) Carolan Clines, MD as Consulting Physician (Urology) Gatha Mayer, MD as Consulting Physician (Gastroenterology) Wyatt Portela, MD as Consulting Physician (Oncology)  Extended Emergency Contact Information Primary Emergency Contact: Fox,Richard (Dr) Address: [address]          [place], [place] Phone: [phone] Work Phone: [phone] Relation: Alanson Puls Secondary Emergency Contact: Claudell Kyle Address: [place] [address] Johnnette Litter of [place] Mobile Phone: [phone] Relation: Son  Code Status:  DNR Goals of care: Advanced Directive information Advanced Directives 10/29/2016  Does Patient Have a Medical Advance Directive? Yes  Type of Advance Directive [place]  Does patient want to make changes to medical advance directive? -  Copy of [place] in Chart? Yes  Pre-existing out of facility DNR order (yellow form or pink MOST form) Yellow form placed in chart (order not valid for inpatient use)     Chief Complaint  Patient presents with  . Acute Visit    right hip pain, UTI    HPI:  Pt is a 83 y.o. female seen today at [facility] rehab after an ER visit on 5/15.  She was evaluated for AMS and found to have a UTI, 100,000 colonies of E.coli grew from urine. She did not have any urinary symptoms or fever. Her labs showed a K of 3.1 and so she was put on Kdur 20 meq BID for 1 week. Also on Cipro 500 mg BID for 1 week.  Staff report that she feels like her body is rising from the bed or chair even  though it is not and she is able to recognize that this is a delusion. When she first came to the ER she was confused and had just had a dream that she was in an abyss.  Staff at [facility] indicate she has had episodes of feeling like she is floating before. She is convinced that the Cipro is causing the delusions but has had Cipro before without issues.  She has chronic urinary retention, bladder prolapse, and has home health do catheterizations BID.  Followed by Dr. Hartley Barefoot.   Resident reports right hip/groin pain for 1 year. She denies any associated injury but later reports that she has had falls at home due to left foot drop.  The pain is not radicular and seems worse at night and with ambulation.   MMSE 26/30, passed clock, failed at serial 7's and did not know the date  CT of the abd/pelvis 01/26/14 IMPRESSION: 1. New enhancing 1.8 cm mass on the lower pole of the left kidney worrisome for renal cell carcinoma. 2. Single tiny gallstone. 3. Multiple tiny low-density areas in the right kidney, most likely cysts but there to small to characterize. 4. No evidence of recurrent colon cancer. 5. Severe spinal stenosis at L4-5.  Functional status: walks with a rolling walker, intermittently incontinent and wears a brief. Lives in [place] but is planning to move to [place] in the future.   Past Medical History:  Diagnosis Date  . Anemia, unspecified   . Asymptomatic  varicose veins   . Carotid bruit    hx of  . Carotid bruit    doppler normal in the past  . Diverticulosis   . Dyslipidemia   . Esophageal reflux   . Female bladder prolapse, acquired 07/17/2010  . Fibroid   . Full incontinence of feces   . Hemorrhoid    1963 surgical excision  . History of colon cancer    Adenocarcinoma of right colon, stage T2, N1.s/p resection/chemotherapy 2002  . HTN (hypertension)   . Hx of colonic polyps    1965 surgical excision  . Hypothyroidism   . Idiopathic osteoporosis   . Idiopathic  osteoporosis   . Internal hemorrhoids without mention of complication    bleed easily  . Irritable bowel syndrome   . Melanoma of skin, site unspecified   . MVP (mitral valve prolapse)   . OA (osteoarthritis)    left hip  . Osteoarthrosis, unspecified whether generalized or localized, pelvic region and thigh   . Peripheral neuropathy    hands/ feet  . 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 Spinal stenosis, unspecified region other than cervical   . Vaginal bleeding, abnormal   . Vaginitis, atrophic   . Varicose veins    Past Surgical History:  Procedure Laterality Date  . COLONOSCOPY W/ POLYPECTOMY  2006   3 mm polyp destroyed, diverticulosis  . DILATION AND CURETTAGE OF UTERUS    . ESOPHAGOGASTRODUODENOSCOPY  2009   mild gastritis  . HEMICOLECTOMY  2002   right  . [place]  . HYSTEROSCOPY    . MELANOMA EXCISION      Allergies  Allergen Reactions  . Amoxil [Amoxicillin] Diarrhea    Has patient had a PCN reaction causing immediate rash, facial/tongue/throat swelling, SOB or lightheadedness with hypotension: no Has patient had a PCN reaction causing severe rash involving mucus membranes or skin necrosis: no Has patient had a PCN reaction that required hospitalization : no Has patient had a PCN reaction occurring within the last 10 years: no If all of the above answers are "NO", then may proceed with Cephalosporin use.   Otho Darner Allergy]     sensitivity  . Demerol     unknown  . Meperidine Hcl Other (See Comments)    Drop in blood pressure  . Infed [Iron Dextran] Rash    Became very red over face, scalp, and arms  . Neosporin [Neomycin-Bacitracin Zn-Polymyx] Rash    unknown    Allergies as of 10/31/2016      Reactions   Amoxil [amoxicillin] Diarrhea   Has patient had a PCN reaction causing immediate rash, facial/tongue/throat swelling, SOB or lightheadedness with hypotension: no Has patient had a PCN reaction causing severe rash involving  mucus membranes or skin necrosis: no Has patient had a PCN reaction that required hospitalization : no Has patient had a PCN reaction occurring within the last 10 years: no If all of the above answers are "NO", then may proceed with Cephalosporin use.   Crab [shellfish Allergy]    sensitivity   Demerol    unknown   Meperidine Hcl Other (See Comments)   Drop in blood pressure   Infed [iron Dextran] Rash   Became very red over face, scalp, and arms   Neosporin [neomycin-bacitracin Zn-polymyx] Rash   unknown      Medication List       Accurate as of 10/31/16 10:14 AM. Always use your most recent med list.  ALIGN 4 MG Caps Take 1 tablet by mouth daily.   ALPRAZolam 0.25 MG tablet Commonly known as:  XANAX TAKE 1 TABLET AT BEDTIME AS NEEDED FOR ANXIETY.   antiseptic oral rinse Liqd 15 mLs by Mouth Rinse route as needed for dry mouth.   BESIVANCE 0.6 % Susp Generic drug:  Besifloxacin HCl   cholecalciferol 1000 units tablet Commonly known as:  VITAMIN D Take 1 tablet (1,000 Units total) by mouth daily.   ciprofloxacin 500 MG tablet Commonly known as:  CIPRO Take 1 tablet (500 mg total) by mouth 2 (two) times daily.   CITRUCEL FIBERSHAKE oral powder Generic drug:  methylcellulose Take 1 packet by mouth daily.   fluticasone 50 MCG/ACT nasal spray Commonly known as:  FLONASE Place 1 spray into both nostrils daily.   hydrocortisone 2.5 % rectal cream Commonly known as:  ANUSOL-HC Place 1 application rectally as needed for hemorrhoids or itching.   hydrocortisone 25 MG suppository Commonly known as:  ANUSOL-HC Place 25 mg rectally 2 (two) times daily as needed for hemorrhoids or itching.   levothyroxine 75 MCG tablet Commonly known as:  SYNTHROID, LEVOTHROID TAKE 1 TABLET DAILY BEFORE BREAKFAST FOR THYROID.   methenamine 1 g tablet Commonly known as:  HIPREX Take 1 g by mouth at bedtime.   Potassium Chloride ER 20 MEQ Tbcr Take 20 mEq by mouth 2 (two)  times daily.   ranitidine 150 MG tablet Commonly known as:  ZANTAC Take 150 mg by mouth 2 (two) times daily as needed for heartburn.   TUMS 500 MG chewable tablet Generic drug:  calcium carbonate Chew 1 tablet by mouth daily as needed for indigestion (indigestion).   URIBEL PO Take 1 capsule by mouth daily.       Review of Systems  Constitutional: Positive for activity change. Negative for appetite change, chills, diaphoresis, fatigue, fever and unexpected weight change.  HENT: Negative for congestion and trouble swallowing.        Dry mouth, chronic dry skin, HOH  Eyes:       Poor vision  Respiratory: Negative for cough, shortness of breath and wheezing.   Cardiovascular: Negative for chest pain, palpitations and leg swelling.  Gastrointestinal: Negative for abdominal distention, abdominal pain, constipation, diarrhea, nausea, rectal pain and vomiting.  Endocrine: Negative for polyuria.  Genitourinary: Negative for decreased urine volume, difficulty urinating, dysuria, flank pain, frequency, genital sores, pelvic pain and urgency.       Catheterization BID due to retention  Musculoskeletal: Positive for arthralgias and gait problem. Negative for back pain, joint swelling and myalgias.       Uses walker  Neurological: Positive for weakness and numbness. Negative for dizziness, tremors, seizures, syncope, facial asymmetry, speech difficulty, light-headedness and headaches.  Psychiatric/Behavioral: Positive for sleep disturbance. Negative for agitation, behavioral problems, confusion, decreased concentration and dysphoric mood. The patient is not nervous/anxious and is not hyperactive.        Mild memory loss. Delusions    Immunization History  Administered Date(s) Administered  . H1N1 05/30/2008  . Influenza Whole 03/17/2000, 03/05/2010, 03/17/2012  . Influenza,inj,Quad PF,36+ Mos 03/14/2015  . Influenza-Unspecified 04/16/2013, 03/31/2014, 04/11/2016  . Pneumococcal  Conjugate-13 09/08/2015  . Pneumococcal Polysaccharide-23 03/17/1997  . Tdap 10/09/2011   Pertinent  Health Maintenance Due  Topic Date Due  . INFLUENZA VACCINE  01/15/2017  . DEXA SCAN  Completed  . PNA vac Low Risk Adult  Completed   Fall Risk  10/10/2016 08/28/2016 05/15/2016 02/14/2016 02/01/2016  Falls in the  past year? Yes Yes No No No  Number falls in past yr: 1 1 - - -  Injury with Fall? - No - - -   Functional Status Survey:    Vitals:   10/31/16 1012  BP: 121/68  Pulse: 64  Resp: 18  Temp: 97.6 F (36.4 C)  SpO2: 98%  Weight: 92 lb 12.8 oz (42.1 kg)   Body mass index is 20.81 kg/m. Physical Exam  Constitutional: She is oriented to person, place, and time. No distress.  HENT:  Head: Normocephalic and atraumatic.  Nose: Nose normal.  Mouth/Throat: No oropharyngeal exudate.  Dry mouth  Eyes: Conjunctivae are normal. Pupils are equal, round, and reactive to light. Right eye exhibits no discharge. Left eye exhibits no discharge.  Neck: No JVD present.  Cardiovascular: Normal rate and regular rhythm.   Murmur heard. Pulmonary/Chest: Effort normal and breath sounds normal. No respiratory distress. She has no wheezes.  Abdominal: Soft. Bowel sounds are normal. She exhibits no distension. There is no tenderness.  Musculoskeletal: She exhibits no edema or tenderness.  Crepitus and slight swelling to the right shoulder which she states is chronic, no pain or redness. No pain with ROM of the right hip or with palpation. Left foot drop noted. Strength 4/5 to both quads  Neurological: She is alert and oriented to person, place, and time. No cranial nerve deficit.  Skin: Skin is warm and dry. She is not diaphoretic.  Psychiatric: She has a normal mood and affect.  Nursing note and vitals reviewed.   Labs reviewed:  Recent Labs  12/01/15 1937 10/04/16 10/29/16 1220  NA 125* 136* 136  K 3.2* 3.1* 3.1*  CL 90*  --  99*  CO2 24  --  28  GLUCOSE 127*  --  95  BUN 12  12 13   CREATININE 0.51 0.5 0.58  CALCIUM 8.3*  --  8.7*    Recent Labs  12/01/15 1937 10/04/16 10/29/16 1220  AST 37 31 39  ALT 21 16 18   ALKPHOS 75 94 79  BILITOT 1.4*  --  0.8  PROT 6.6  --  6.7  ALBUMIN 3.3*  --  3.3*    Recent Labs  12/01/15 1937 10/04/16 10/29/16 1220  WBC 11.8* 5.8 4.4  NEUTROABS 10.5*  --  2.9  HGB 10.5* 11.3* 11.0*  HCT 30.6* 33* 33.4*  MCV 89.5  --  91.3  PLT 181 215 215   Lab Results  Component Value Date   TSH 1.18 04/25/2015   Lab Results  Component Value Date   HGBA1C 5.9 12/02/2006   Lab Results  Component Value Date   CHOL 174 11/07/2015   HDL 79 (A) 11/07/2015   LDLCALC 78 11/07/2015   LDLDIRECT 123.7 12/08/2007   TRIG 87 11/07/2015   CHOLHDL 3 07/16/2011    Significant Diagnostic Results in last 30 days:  No results found.  Assessment/Plan  1. Urinary tract infection associated with indwelling urethral catheter, initial encounter ([place]) Will reduce Cipro to 250 mg BID given her age/size and renal function, she reports this may be causing her confusion but this could also be associated with underlying dementia and current infection  Hold Methenamine while on Cipro  2. Urinary retention Continue catheterizations BID  Follow up with [facility] as indicated  3. Hypokalemia Continue Kdur 20 meq BID to complete 7 days  4. Excessive flatus Simethicone 125 gm TID prn   5. Hypothyroidism, unspecified type Continue synthroid 75 mcg qd  6. Essential hypertension  Controlled without meds  7. Gastroesophageal reflux disease without esophagitis Hx of gastritis Continue Zantac 150 mg qd Resident wants TUMs d/c'd  8. Right hip pain Chronic, most likely OA related, but given recurrent fall hx will check xray Try tylenol 650 mg BID scheduled  9. Xerostomia Biotene 15 ml TID   10. Left foot drop Wears brace to left, hx of spinal stenosis noted on CT L4-5 distrubution  11. Renal mass Noted on the left kidney CT in  2015, followed by urology (their notes are not available)  12. Deconditioning PT and OT eval and treat Would benefit from AL placement but will await their input.   Family/ staff Communication: discussed with resident  Labs/tests ordered: right hip xray

## 2016-11-01 ENCOUNTER — Telehealth: Payer: Self-pay | Admitting: *Deleted

## 2016-11-01 DIAGNOSIS — E876 Hypokalemia: Secondary | ICD-10-CM | POA: Diagnosis not present

## 2016-11-01 DIAGNOSIS — R2689 Other abnormalities of gait and mobility: Secondary | ICD-10-CM | POA: Diagnosis not present

## 2016-11-01 DIAGNOSIS — R278 Other lack of coordination: Secondary | ICD-10-CM | POA: Diagnosis not present

## 2016-11-01 DIAGNOSIS — G609 Hereditary and idiopathic neuropathy, unspecified: Secondary | ICD-10-CM | POA: Diagnosis not present

## 2016-11-01 DIAGNOSIS — M21372 Foot drop, left foot: Secondary | ICD-10-CM | POA: Diagnosis not present

## 2016-11-01 DIAGNOSIS — M6389 Disorders of muscle in diseases classified elsewhere, multiple sites: Secondary | ICD-10-CM | POA: Diagnosis not present

## 2016-11-01 NOTE — Telephone Encounter (Signed)
Post ED Visit - Positive Culture Follow-up  Culture report reviewed by antimicrobial stewardship pharmacist:  []  Elenor Quinones, Pharm.D. [x]  Heide Guile, Pharm.D., BCPS AQ-ID []  Parks Neptune, Pharm.D., BCPS []  Alycia Rossetti, Pharm.D., BCPS []  [place], Pharm.D., BCPS, AAHIVP []  Legrand Como, Pharm.D., BCPS, AAHIVP []  Salome Arnt, PharmD, BCPS []  Dimitri Ped, PharmD, BCPS []  Vincenza Hews, PharmD, BCPS  Positive urine culture Treated with Ciprofloxacin, organism sensitive to the same and no further patient follow-up is required at this time.  Harlon Flor [facility] 11/01/2016, 9:37 AM

## 2016-11-04 ENCOUNTER — Encounter (INDEPENDENT_AMBULATORY_CARE_PROVIDER_SITE_OTHER): Payer: Self-pay | Admitting: Ophthalmology

## 2016-11-04 DIAGNOSIS — G609 Hereditary and idiopathic neuropathy, unspecified: Secondary | ICD-10-CM | POA: Diagnosis not present

## 2016-11-04 DIAGNOSIS — R2689 Other abnormalities of gait and mobility: Secondary | ICD-10-CM | POA: Diagnosis not present

## 2016-11-04 DIAGNOSIS — R278 Other lack of coordination: Secondary | ICD-10-CM | POA: Diagnosis not present

## 2016-11-04 DIAGNOSIS — E876 Hypokalemia: Secondary | ICD-10-CM | POA: Diagnosis not present

## 2016-11-04 DIAGNOSIS — M21372 Foot drop, left foot: Secondary | ICD-10-CM | POA: Diagnosis not present

## 2016-11-04 DIAGNOSIS — M6389 Disorders of muscle in diseases classified elsewhere, multiple sites: Secondary | ICD-10-CM | POA: Diagnosis not present

## 2016-11-05 ENCOUNTER — Encounter (INDEPENDENT_AMBULATORY_CARE_PROVIDER_SITE_OTHER): Payer: Self-pay | Admitting: Ophthalmology

## 2016-11-05 ENCOUNTER — Non-Acute Institutional Stay (SKILLED_NURSING_FACILITY): Payer: Medicare Other | Admitting: Internal Medicine

## 2016-11-05 ENCOUNTER — Encounter: Payer: Self-pay | Admitting: Internal Medicine

## 2016-11-05 DIAGNOSIS — N8110 Cystocele, unspecified: Secondary | ICD-10-CM

## 2016-11-05 DIAGNOSIS — N390 Urinary tract infection, site not specified: Secondary | ICD-10-CM

## 2016-11-05 DIAGNOSIS — F05 Delirium due to known physiological condition: Secondary | ICD-10-CM

## 2016-11-05 DIAGNOSIS — M25551 Pain in right hip: Secondary | ICD-10-CM | POA: Diagnosis not present

## 2016-11-05 DIAGNOSIS — E876 Hypokalemia: Secondary | ICD-10-CM

## 2016-11-05 DIAGNOSIS — M6389 Disorders of muscle in diseases classified elsewhere, multiple sites: Secondary | ICD-10-CM | POA: Diagnosis not present

## 2016-11-05 DIAGNOSIS — R339 Retention of urine, unspecified: Secondary | ICD-10-CM | POA: Diagnosis not present

## 2016-11-05 DIAGNOSIS — T83511D Infection and inflammatory reaction due to indwelling urethral catheter, subsequent encounter: Secondary | ICD-10-CM

## 2016-11-05 DIAGNOSIS — K5909 Other constipation: Secondary | ICD-10-CM

## 2016-11-05 DIAGNOSIS — G609 Hereditary and idiopathic neuropathy, unspecified: Secondary | ICD-10-CM | POA: Diagnosis not present

## 2016-11-05 DIAGNOSIS — R5381 Other malaise: Secondary | ICD-10-CM | POA: Diagnosis not present

## 2016-11-05 DIAGNOSIS — R278 Other lack of coordination: Secondary | ICD-10-CM | POA: Diagnosis not present

## 2016-11-05 DIAGNOSIS — M21372 Foot drop, left foot: Secondary | ICD-10-CM | POA: Diagnosis not present

## 2016-11-05 DIAGNOSIS — R2689 Other abnormalities of gait and mobility: Secondary | ICD-10-CM | POA: Diagnosis not present

## 2016-11-05 NOTE — Progress Notes (Signed)
Patient ID: [name] Small, female   DOB: 1917-03-01, 83 y.o.   MRN: [id number]  Provider:  Gwenith Spitz. Renato Gails, D.O., C.M.D. Location:  [facility] Room Number: 146 Rehab Place of Service:  SNF (31)  PCP: Kermit Balo, DO Patient Care Team: Kermit Balo, DO as PCP - General (Geriatric Medicine) Ok Edwards, MD as Consulting Physician (Gynecology) Mckinley Jewel, MD as Consulting Physician (Ophthalmology) Jethro Bolus, MD as Consulting Physician (Urology) Iva Boop, MD as Consulting Physician (Gastroenterology) Benjiman Core, MD as Consulting Physician (Oncology)  Extended Emergency Contact Information Primary Emergency Contact: Fox,Richard (Dr) Address: [address]          [place], [place] [employer] [place] Home Phone: [phone] Work Phone: [phone] Relation: Aurther Loft Secondary Emergency Contact: Antonieta Iba Address: [address]          [place], [place] [address] [place] Phone: [phone] Relation: Son  Code Status: DNR Goals of Care: Advanced Directive information Advanced Directives 11/05/2016  Does Patient Have a Medical Advance Directive? Yes  Type of Advance Directive Out of facility DNR (pink MOST or yellow form);Living will;Healthcare Power of Attorney  Does patient want to make changes to medical advance directive? -  Copy of Healthcare Power of Attorney in Chart? Yes  Pre-existing out of facility DNR order (yellow form or pink MOST form) Yellow form placed in chart (order not valid for inpatient use);Pink MOST form placed in chart (order not valid for inpatient use)   Chief Complaint  Patient presents with  . New Admit To SNF    Rehab admission    HPI: Patient is a 83 y.o. female seen today for admission to [facility] rehab s/p ED visit for delirium felt to be secondary to UTI. Pt has h/o urinary retention with bid I/O caths by [facility] nursing.  She follows  with urology and has been on medications for bladder spasms and prolapse and is on abx for UTIs off and on for the past two years.  She also has difficulty with urinary and fecal incontinence with hemorrhoidal bleeding and irritation which complicates the situation.  She has been on methenamine prophylaxis which apparently is not working either if these are legit infections.  Her vision is declining and she's having more difficulty taking care of herself safely in [place].  She requires help to put in her eye drops.  She ambulates with a walker and has a brace on her foot due to foot drop.  She's recently had some difficulty with right hip pain which has been evaluated twice in clinic and felt to be arthritis.  She had the renal mass in 2015 and opted to do nothing with it.    When she was sent out on 10/29/16, she was not even seen by [facility] nursing first--apparently, [facility] nurses found her weak and confused in bed, and called EMS directly.  Not surprisingly, her urine dipstick was positive in the ED.  Potassium was low at 3.1 and repleted.  She was started on cipro due to prior cultures positive for pseudomonas.  Culture from that day grew out E coli sensitive to the cipro therapy 500mg  po bid x 7 days.  She actually did pretty well on her MMSE when seen here 5/17 by NP with score of 26/30 passing clock.  She has had some recent cognitive losses notable and has significant anxiety.  Past Medical History:  Diagnosis Date  . Anemia, unspecified   .  Asymptomatic varicose veins   . Carotid bruit    hx of  . Carotid bruit    doppler normal in the past  . Diverticulosis   . Dyslipidemia   . Esophageal reflux   . Female bladder prolapse, acquired 07/17/2010  . Fibroid   . Full incontinence of feces   . Hemorrhoid    1963 surgical excision  . History of colon cancer    Adenocarcinoma of right colon, stage T2, N1.s/p resection/chemotherapy 2002  . HTN (hypertension)   . Hx of colonic polyps    1965 surgical  excision  . Hypothyroidism   . Idiopathic osteoporosis   . Idiopathic osteoporosis   . Internal hemorrhoids without mention of complication    bleed easily  . Irritable bowel syndrome   . Melanoma of skin, site unspecified   . MVP (mitral valve prolapse)   . OA (osteoarthritis)    left hip  . Osteoarthrosis, unspecified whether generalized or localized, pelvic region and thigh   . Peripheral neuropathy    hands/ feet  . 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 Spinal stenosis, unspecified region other than cervical   . Vaginal bleeding, abnormal   . Vaginitis, atrophic   . Varicose veins    Past Surgical History:  Procedure Laterality Date  . COLONOSCOPY W/ POLYPECTOMY  2006   3 mm polyp destroyed, diverticulosis  . DILATION AND CURETTAGE OF UTERUS    . ESOPHAGOGASTRODUODENOSCOPY  2009   mild gastritis  . HEMICOLECTOMY  2002   right  . HEMORRHOID SURGERY  1965  . HYSTEROSCOPY    . MELANOMA EXCISION      reports that she quit smoking about 73 years ago. Her smoking use included Cigarettes. She quit after 2.00 years of use. She has never used smokeless tobacco. She reports that she drinks alcohol. She reports that she does not use drugs. Social History   Social History  . Marital status: Widowed    Spouse name: N/A  . Number of children: 2  . Years of education: N/A   Occupational History  . retired Retired   Social History Main Topics  . Smoking status: Former Smoker    Years: 2.00    Types: Cigarettes    Quit date: 09/09/1943  . Smokeless tobacco: Never Used  . Alcohol use 0.0 oz/week     Comment: occas wine 2 glasses a week  . Drug use: No  . Sexual activity: No   Other Topics Concern  . Not on file   Social History Narrative   widowed after a very long and happy marriage almost 60 years. 2 sons - one a Nurse, learning disability, several grandchildren. Close and attentive family. Lives alone at [facility], since 7/ 2004 : paricipates in yoga and exercise. I-ADLs  including driving. Supportive family who checks on her and visits her regularly.   Advanced Care planning documents: DNR, MOST form, Living Will   Walks with walker   Exercise: M-W-F class         Functional Status Survey:  currently requiring bid I/O caths, assistance with eye drops, but able to bathe and dress herself  Family History  Problem Relation Age of Onset  . Heart disease Father   . Diabetes Father   . Heart disease Sister   . Stomach cancer Maternal Grandfather   . Colon cancer Neg Hx     Health Maintenance  Topic Date Due  . INFLUENZA VACCINE  01/15/2017  . TETANUS/TDAP  10/08/2021  .  DEXA SCAN  Completed  . PNA vac Low Risk Adult  Completed    Allergies  Allergen Reactions  . Amoxil [Amoxicillin] Diarrhea    Has patient had a PCN reaction causing immediate rash, facial/tongue/throat swelling, SOB or lightheadedness with hypotension: no Has patient had a PCN reaction causing severe rash involving mucus membranes or skin necrosis: no Has patient had a PCN reaction that required hospitalization : no Has patient had a PCN reaction occurring within the last 10 years: no If all of the above answers are "NO", then may proceed with Cephalosporin use.   Parke Simmers Allergy]     sensitivity  . Demerol     unknown  . Meperidine Hcl Other (See Comments)    Drop in blood pressure  . Infed [Iron Dextran] Rash    Became very red over face, scalp, and arms  . Neosporin [Neomycin-Bacitracin Zn-Polymyx] Rash    unknown    Outpatient Encounter Prescriptions as of 11/05/2016  Medication Sig  . acetaminophen (TYLENOL) 650 MG CR tablet Take 650 mg by mouth 2 (two) times daily.  Marland Kitchen ALPRAZolam (XANAX) 0.25 MG tablet TAKE 1 TABLET AT BEDTIME AS NEEDED FOR ANXIETY.  Marland Kitchen antiseptic oral rinse (BIOTENE) LIQD 15 mLs by Mouth Rinse route 3 (three) times daily.  . Artificial Tear Ointment (ARTIFICIAL TEARS) ointment Place 2 drops into the left eye 2 (two) times daily.  .  cholecalciferol (VITAMIN D) 1000 UNITS tablet Take 1 tablet (1,000 Units total) by mouth daily.  . hydrocortisone (ANUSOL-HC) 2.5 % rectal cream Place 1 application rectally daily as needed for hemorrhoids or itching.   . levothyroxine (SYNTHROID, LEVOTHROID) 75 MCG tablet TAKE 1 TABLET DAILY BEFORE BREAKFAST FOR THYROID.  Marland Kitchen Meth-Hyo-M Bl-Na Phos-Ph Sal (URIBEL PO) Take 1 capsule by mouth daily.   . methenamine (HIPREX) 1 g tablet Take 1 g by mouth at bedtime.  . methylcellulose (CITRUCEL FIBERSHAKE) oral powder Take 1 packet by mouth daily.  Bertram Gala Glycol-Propyl Glycol (SYSTANE) 0.4-0.3 % SOLN Place 2 drops into the right eye 2 (two) times daily.  . Potassium Chloride ER 20 MEQ TBCR Take 20 mEq by mouth 2 (two) times daily.  . potassium chloride SA (K-DUR,KLOR-CON) 20 MEQ tablet Take 20 mEq by mouth 2 (two) times daily.  . Probiotic Product (ALIGN) 4 MG CAPS Take 1 tablet by mouth daily.    . ranitidine (ZANTAC) 150 MG tablet Take 150 mg by mouth 2 (two) times daily as needed for heartburn.   . simethicone (MYLICON) 125 MG chewable tablet Chew 125 mg by mouth 3 (three) times daily as needed for flatulence.  . [DISCONTINUED] antiseptic oral rinse (BIOTENE) LIQD 15 mLs by Mouth Rinse route as needed for dry mouth.  . [DISCONTINUED] BESIVANCE 0.6 % SUSP   . [DISCONTINUED] calcium carbonate (TUMS) 500 MG chewable tablet Chew 1 tablet by mouth daily as needed for indigestion (indigestion).   . [DISCONTINUED] ciprofloxacin (CIPRO) 500 MG tablet Take 1 tablet (500 mg total) by mouth 2 (two) times daily.  . [DISCONTINUED] fluticasone (FLONASE) 50 MCG/ACT nasal spray Place 1 spray into both nostrils daily.  . [DISCONTINUED] hydrocortisone (ANUSOL-HC) 25 MG suppository Place 25 mg rectally 2 (two) times daily as needed for hemorrhoids or itching.   No facility-administered encounter medications on file as of 11/05/2016.     Review of Systems  Constitutional: Positive for activity change and  fatigue. Negative for appetite change, chills and fever.  HENT: Positive for hearing loss. Negative for congestion and  trouble swallowing.   Eyes: Positive for visual disturbance.       Poor vision  Respiratory: Negative for chest tightness and shortness of breath.   Cardiovascular: Negative for chest pain, palpitations and leg swelling.  Gastrointestinal: Negative for abdominal distention, abdominal pain, constipation, nausea and vomiting.  Genitourinary: Positive for urgency. Negative for decreased urine volume, dysuria, frequency and vaginal bleeding.       Urinary leakage  Musculoskeletal: Positive for arthralgias and gait problem.       Ambulates with walker with ankle brace for foot drop; right groin pain ongoing  Allergic/Immunologic: Negative for immunocompromised state.  Neurological: Positive for weakness. Negative for dizziness.       Getting PT, OT  Hematological: Bruises/bleeds easily.  Psychiatric/Behavioral: Positive for confusion. Negative for agitation, behavioral problems and sleep disturbance. The patient is nervous/anxious.     Vitals:   11/05/16 0922  BP: (!) 149/78  Pulse: 79  Resp: 20  Temp: 97.5 F (36.4 C)  TempSrc: Oral  SpO2: 98%  Weight: 92 lb (41.7 kg)   Body mass index is 20.63 kg/m. Physical Exam  Constitutional: She is oriented to person, place, and time. She appears well-developed and well-nourished. No distress.  Thin female  HENT:  Head: Normocephalic and atraumatic.  Right Ear: External ear normal.  Left Ear: External ear normal.  Nose: Nose normal.  Mouth/Throat: Oropharynx is clear and moist.  Very HOH  Eyes: Conjunctivae are normal. Pupils are equal, round, and reactive to light. No scleral icterus.  Poor vision  Neck: Normal range of motion. Neck supple. No JVD present.  Cardiovascular: Normal rate, regular rhythm, normal heart sounds and intact distal pulses.   Pulmonary/Chest: Effort normal and breath sounds normal. No respiratory  distress.  Abdominal: Soft. Bowel sounds are normal. She exhibits no distension and no mass. There is no tenderness. There is no rebound and no guarding.  Genitourinary:  Genitourinary Comments: No suprapubic tenderness or cva tenderness  Musculoskeletal: Normal range of motion. She exhibits tenderness.  foot drop; tenderness right groin area  Lymphadenopathy:    She has no cervical adenopathy.  Neurological: She is alert and oriented to person, place, and time.  A little slower giving history and gradually more confused over last several visits  Skin: Skin is warm and dry. There is pallor.  Psychiatric: Her behavior is normal.  Usual anxious self, talks fast    Labs reviewed: Basic Metabolic Panel:  Recent Labs  16/10/96 1937 10/04/16 10/29/16 1220  NA 125* 136* 136  K 3.2* 3.1* 3.1*  CL 90*  --  99*  CO2 24  --  28  GLUCOSE 127*  --  95  BUN 12 12 13   CREATININE 0.51 0.5 0.58  CALCIUM 8.3*  --  8.7*   Liver Function Tests:  Recent Labs  12/01/15 1937 10/04/16 10/29/16 1220  AST 37 31 39  ALT 21 16 18   ALKPHOS 75 94 79  BILITOT 1.4*  --  0.8  PROT 6.6  --  6.7  ALBUMIN 3.3*  --  3.3*   No results for input(s): LIPASE, AMYLASE in the last 8760 hours. No results for input(s): AMMONIA in the last 8760 hours. CBC:  Recent Labs  12/01/15 1937 10/04/16 10/29/16 1220  WBC 11.8* 5.8 4.4  NEUTROABS 10.5*  --  2.9  HGB 10.5* 11.3* 11.0*  HCT 30.6* 33* 33.4*  MCV 89.5  --  91.3  PLT 181 215 215   Cardiac Enzymes: No results for input(s):  CKTOTAL, CKMB, CKMBINDEX, TROPONINI in the last 8760 hours. BNP: Invalid input(s): POCBNP Lab Results  Component Value Date   HGBA1C 5.9 12/02/2006   Lab Results  Component Value Date   TSH 1.18 04/25/2015   Lab Results  Component Value Date   VITAMINB12 >1500 (H) 07/05/2013   Lab Results  Component Value Date   FOLATE >20.0 ng/mL 01/06/2009   Lab Results  Component Value Date   IRON 65 06/04/2013   TIBC 416  06/20/2009   FERRITIN 34 06/20/2009    Imaging and Procedures obtained prior to SNF admission: ED labs and studies reviewed.  Hip xray reviewed.    Assessment/Plan 1. Urinary tract infection associated with catheterization of urinary tract, unspecified indwelling urinary catheter type, subsequent encounter -was treated with appropriate abx with cipro for Ecoli (was sensitive) -completes abx tonight -mentions still having pain, but when asked to localize, it's her right groin not her bladder -she is continuing to have an increase in incontinence but this is likely due to progression of her incontinence from bladder prolapse or she may be retaining more urine and require more frequent caths -resume abx prophylaxis as per urology (methenamine) -cont uribel for cystitis  2. Urinary retention -getting I/O caths bid, staff to check with Dr. Patsi Sears if this must increase  3. Female bladder prolapse, acquired -contributory to incontinence, also, and constant slight leakage which contributes to infections -suspect she's being overtreated due to colonization with I/O caths  4. Right hip pain -has severe degenerative disease of both of her hips as expected --tylenol 650mg  po bid has been added for this, but was not given just yet  5. Delirium due to multiple etiologies -mix of UTI, pain, ED visit, relocation to rehab, etc. -improving, still describing unusual levitation delusion from earlier this am, but does not seem actively delirious at present moment  6. Hypokalemia -potassium was repleted  7. Physical deconditioning -cont PT, OT in prep for [place] admission  8. Chronic constipation with overflow incontinence -cont simethicone, align, citrucel -has anusol cream for hemorrhoids but suppositories were d/cd for some reason (she used them pretty often at home)  Family/ staff Communication: discussed with [facility] rehab nurse  Labs/tests ordered:  No new  Admission to [place] planned for next  month--may need respite stay there between  Costantino Kohlbeck L. Nusrat Encarnacion, D.O. Geriatrics [facility] [address] [place], [place] [address] Cell Phone (Mon-Fri 8am-5pm):  [phone] On Call:  [phone] & follow prompts after 5pm & weekends Office Phone:  [phone] Office Fax:  [phone]

## 2016-11-06 DIAGNOSIS — R2689 Other abnormalities of gait and mobility: Secondary | ICD-10-CM | POA: Diagnosis not present

## 2016-11-06 DIAGNOSIS — R278 Other lack of coordination: Secondary | ICD-10-CM | POA: Diagnosis not present

## 2016-11-06 DIAGNOSIS — E876 Hypokalemia: Secondary | ICD-10-CM | POA: Diagnosis not present

## 2016-11-06 DIAGNOSIS — G609 Hereditary and idiopathic neuropathy, unspecified: Secondary | ICD-10-CM | POA: Diagnosis not present

## 2016-11-06 DIAGNOSIS — M21372 Foot drop, left foot: Secondary | ICD-10-CM | POA: Diagnosis not present

## 2016-11-06 DIAGNOSIS — M6389 Disorders of muscle in diseases classified elsewhere, multiple sites: Secondary | ICD-10-CM | POA: Diagnosis not present

## 2016-11-07 DIAGNOSIS — R278 Other lack of coordination: Secondary | ICD-10-CM | POA: Diagnosis not present

## 2016-11-07 DIAGNOSIS — M6389 Disorders of muscle in diseases classified elsewhere, multiple sites: Secondary | ICD-10-CM | POA: Diagnosis not present

## 2016-11-07 DIAGNOSIS — R2689 Other abnormalities of gait and mobility: Secondary | ICD-10-CM | POA: Diagnosis not present

## 2016-11-07 DIAGNOSIS — M21372 Foot drop, left foot: Secondary | ICD-10-CM | POA: Diagnosis not present

## 2016-11-07 DIAGNOSIS — E876 Hypokalemia: Secondary | ICD-10-CM | POA: Diagnosis not present

## 2016-11-07 DIAGNOSIS — G609 Hereditary and idiopathic neuropathy, unspecified: Secondary | ICD-10-CM | POA: Diagnosis not present

## 2016-11-08 DIAGNOSIS — G609 Hereditary and idiopathic neuropathy, unspecified: Secondary | ICD-10-CM | POA: Diagnosis not present

## 2016-11-08 DIAGNOSIS — R2689 Other abnormalities of gait and mobility: Secondary | ICD-10-CM | POA: Diagnosis not present

## 2016-11-08 DIAGNOSIS — R278 Other lack of coordination: Secondary | ICD-10-CM | POA: Diagnosis not present

## 2016-11-08 DIAGNOSIS — M21372 Foot drop, left foot: Secondary | ICD-10-CM | POA: Diagnosis not present

## 2016-11-08 DIAGNOSIS — E876 Hypokalemia: Secondary | ICD-10-CM | POA: Diagnosis not present

## 2016-11-08 DIAGNOSIS — M6389 Disorders of muscle in diseases classified elsewhere, multiple sites: Secondary | ICD-10-CM | POA: Diagnosis not present

## 2016-11-11 DIAGNOSIS — E876 Hypokalemia: Secondary | ICD-10-CM | POA: Diagnosis not present

## 2016-11-11 DIAGNOSIS — M21372 Foot drop, left foot: Secondary | ICD-10-CM | POA: Diagnosis not present

## 2016-11-11 DIAGNOSIS — R278 Other lack of coordination: Secondary | ICD-10-CM | POA: Diagnosis not present

## 2016-11-11 DIAGNOSIS — M6389 Disorders of muscle in diseases classified elsewhere, multiple sites: Secondary | ICD-10-CM | POA: Diagnosis not present

## 2016-11-11 DIAGNOSIS — G609 Hereditary and idiopathic neuropathy, unspecified: Secondary | ICD-10-CM | POA: Diagnosis not present

## 2016-11-11 DIAGNOSIS — R2689 Other abnormalities of gait and mobility: Secondary | ICD-10-CM | POA: Diagnosis not present

## 2016-11-12 DIAGNOSIS — M6389 Disorders of muscle in diseases classified elsewhere, multiple sites: Secondary | ICD-10-CM | POA: Diagnosis not present

## 2016-11-12 DIAGNOSIS — M21372 Foot drop, left foot: Secondary | ICD-10-CM | POA: Diagnosis not present

## 2016-11-12 DIAGNOSIS — R2689 Other abnormalities of gait and mobility: Secondary | ICD-10-CM | POA: Diagnosis not present

## 2016-11-12 DIAGNOSIS — G609 Hereditary and idiopathic neuropathy, unspecified: Secondary | ICD-10-CM | POA: Diagnosis not present

## 2016-11-12 DIAGNOSIS — R278 Other lack of coordination: Secondary | ICD-10-CM | POA: Diagnosis not present

## 2016-11-12 DIAGNOSIS — E876 Hypokalemia: Secondary | ICD-10-CM | POA: Diagnosis not present

## 2016-11-13 DIAGNOSIS — M21372 Foot drop, left foot: Secondary | ICD-10-CM | POA: Diagnosis not present

## 2016-11-13 DIAGNOSIS — E876 Hypokalemia: Secondary | ICD-10-CM | POA: Diagnosis not present

## 2016-11-13 DIAGNOSIS — R278 Other lack of coordination: Secondary | ICD-10-CM | POA: Diagnosis not present

## 2016-11-13 DIAGNOSIS — R2689 Other abnormalities of gait and mobility: Secondary | ICD-10-CM | POA: Diagnosis not present

## 2016-11-13 DIAGNOSIS — M6389 Disorders of muscle in diseases classified elsewhere, multiple sites: Secondary | ICD-10-CM | POA: Diagnosis not present

## 2016-11-13 DIAGNOSIS — G609 Hereditary and idiopathic neuropathy, unspecified: Secondary | ICD-10-CM | POA: Diagnosis not present

## 2016-11-14 DIAGNOSIS — E876 Hypokalemia: Secondary | ICD-10-CM | POA: Diagnosis not present

## 2016-11-14 DIAGNOSIS — M6389 Disorders of muscle in diseases classified elsewhere, multiple sites: Secondary | ICD-10-CM | POA: Diagnosis not present

## 2016-11-14 DIAGNOSIS — G609 Hereditary and idiopathic neuropathy, unspecified: Secondary | ICD-10-CM | POA: Diagnosis not present

## 2016-11-14 DIAGNOSIS — R278 Other lack of coordination: Secondary | ICD-10-CM | POA: Diagnosis not present

## 2016-11-14 DIAGNOSIS — M21372 Foot drop, left foot: Secondary | ICD-10-CM | POA: Diagnosis not present

## 2016-11-14 DIAGNOSIS — R2689 Other abnormalities of gait and mobility: Secondary | ICD-10-CM | POA: Diagnosis not present

## 2016-11-15 DIAGNOSIS — R2681 Unsteadiness on feet: Secondary | ICD-10-CM | POA: Diagnosis not present

## 2016-11-15 DIAGNOSIS — N390 Urinary tract infection, site not specified: Secondary | ICD-10-CM | POA: Diagnosis not present

## 2016-11-15 DIAGNOSIS — R278 Other lack of coordination: Secondary | ICD-10-CM | POA: Diagnosis not present

## 2016-11-15 DIAGNOSIS — M21372 Foot drop, left foot: Secondary | ICD-10-CM | POA: Diagnosis not present

## 2016-11-15 DIAGNOSIS — G609 Hereditary and idiopathic neuropathy, unspecified: Secondary | ICD-10-CM | POA: Diagnosis not present

## 2016-11-15 DIAGNOSIS — E876 Hypokalemia: Secondary | ICD-10-CM | POA: Diagnosis not present

## 2016-11-15 DIAGNOSIS — M48061 Spinal stenosis, lumbar region without neurogenic claudication: Secondary | ICD-10-CM | POA: Diagnosis not present

## 2016-11-15 DIAGNOSIS — M6389 Disorders of muscle in diseases classified elsewhere, multiple sites: Secondary | ICD-10-CM | POA: Diagnosis not present

## 2016-11-15 DIAGNOSIS — Z9181 History of falling: Secondary | ICD-10-CM | POA: Diagnosis not present

## 2016-11-15 DIAGNOSIS — R2689 Other abnormalities of gait and mobility: Secondary | ICD-10-CM | POA: Diagnosis not present

## 2016-11-18 DIAGNOSIS — R278 Other lack of coordination: Secondary | ICD-10-CM | POA: Diagnosis not present

## 2016-11-18 DIAGNOSIS — M21372 Foot drop, left foot: Secondary | ICD-10-CM | POA: Diagnosis not present

## 2016-11-18 DIAGNOSIS — E876 Hypokalemia: Secondary | ICD-10-CM | POA: Diagnosis not present

## 2016-11-18 DIAGNOSIS — M6389 Disorders of muscle in diseases classified elsewhere, multiple sites: Secondary | ICD-10-CM | POA: Diagnosis not present

## 2016-11-18 DIAGNOSIS — G609 Hereditary and idiopathic neuropathy, unspecified: Secondary | ICD-10-CM | POA: Diagnosis not present

## 2016-11-18 DIAGNOSIS — R2689 Other abnormalities of gait and mobility: Secondary | ICD-10-CM | POA: Diagnosis not present

## 2016-11-19 DIAGNOSIS — R278 Other lack of coordination: Secondary | ICD-10-CM | POA: Diagnosis not present

## 2016-11-19 DIAGNOSIS — E876 Hypokalemia: Secondary | ICD-10-CM | POA: Diagnosis not present

## 2016-11-19 DIAGNOSIS — M21372 Foot drop, left foot: Secondary | ICD-10-CM | POA: Diagnosis not present

## 2016-11-19 DIAGNOSIS — G609 Hereditary and idiopathic neuropathy, unspecified: Secondary | ICD-10-CM | POA: Diagnosis not present

## 2016-11-19 DIAGNOSIS — R2689 Other abnormalities of gait and mobility: Secondary | ICD-10-CM | POA: Diagnosis not present

## 2016-11-19 DIAGNOSIS — M6389 Disorders of muscle in diseases classified elsewhere, multiple sites: Secondary | ICD-10-CM | POA: Diagnosis not present

## 2016-11-20 DIAGNOSIS — M6389 Disorders of muscle in diseases classified elsewhere, multiple sites: Secondary | ICD-10-CM | POA: Diagnosis not present

## 2016-11-20 DIAGNOSIS — R2689 Other abnormalities of gait and mobility: Secondary | ICD-10-CM | POA: Diagnosis not present

## 2016-11-20 DIAGNOSIS — R278 Other lack of coordination: Secondary | ICD-10-CM | POA: Diagnosis not present

## 2016-11-20 DIAGNOSIS — G609 Hereditary and idiopathic neuropathy, unspecified: Secondary | ICD-10-CM | POA: Diagnosis not present

## 2016-11-20 DIAGNOSIS — M21372 Foot drop, left foot: Secondary | ICD-10-CM | POA: Diagnosis not present

## 2016-11-20 DIAGNOSIS — E876 Hypokalemia: Secondary | ICD-10-CM | POA: Diagnosis not present

## 2016-11-21 ENCOUNTER — Non-Acute Institutional Stay (SKILLED_NURSING_FACILITY): Payer: Medicare Other | Admitting: Adult Health

## 2016-11-21 ENCOUNTER — Encounter: Payer: Self-pay | Admitting: Adult Health

## 2016-11-21 DIAGNOSIS — M6389 Disorders of muscle in diseases classified elsewhere, multiple sites: Secondary | ICD-10-CM | POA: Diagnosis not present

## 2016-11-21 DIAGNOSIS — N8110 Cystocele, unspecified: Secondary | ICD-10-CM | POA: Diagnosis not present

## 2016-11-21 DIAGNOSIS — N390 Urinary tract infection, site not specified: Secondary | ICD-10-CM

## 2016-11-21 DIAGNOSIS — N952 Postmenopausal atrophic vaginitis: Secondary | ICD-10-CM

## 2016-11-21 DIAGNOSIS — R339 Retention of urine, unspecified: Secondary | ICD-10-CM

## 2016-11-21 DIAGNOSIS — M21372 Foot drop, left foot: Secondary | ICD-10-CM | POA: Diagnosis not present

## 2016-11-21 DIAGNOSIS — G609 Hereditary and idiopathic neuropathy, unspecified: Secondary | ICD-10-CM | POA: Diagnosis not present

## 2016-11-21 DIAGNOSIS — E876 Hypokalemia: Secondary | ICD-10-CM | POA: Diagnosis not present

## 2016-11-21 DIAGNOSIS — R278 Other lack of coordination: Secondary | ICD-10-CM | POA: Diagnosis not present

## 2016-11-21 DIAGNOSIS — R2689 Other abnormalities of gait and mobility: Secondary | ICD-10-CM | POA: Diagnosis not present

## 2016-11-21 NOTE — Progress Notes (Signed)
Location:  Occupational psychologist of Service:  SNF (31) Provider:   Cindi Carbon, ANP [place] [phone]  Gayland Curry, DO  Patient Care Team: Gayland Curry, DO as PCP - General (Geriatric Medicine) Terrance Mass, MD as Consulting Physician (Gynecology) Shon Hough, MD as Consulting Physician (Ophthalmology) Carolan Clines, MD as Consulting Physician (Urology) Gatha Mayer, MD as Consulting Physician (Gastroenterology) Wyatt Portela, MD as Consulting Physician (Oncology)  Extended Emergency Contact Information Primary Emergency Contact: Fox,Richard (Dr) Address: [address]          [place], [place] Phone: [phone] Work Phone: [phone] Relation: Alanson Puls Secondary Emergency Contact: Claudell Kyle Address: [place] [address] Johnnette Litter of [place] Mobile Phone: [phone] Relation: Son   Code Status:  DNR Goals of care: Advanced Directive information Advanced Directives 11/05/2016  Does Patient Have a Medical Advance Directive? Yes  Type of Advance Directive Out of facility DNR (pink MOST or yellow form);Living will;Healthcare Power of Attorney  Does patient want to make changes to medical advance directive? -  Copy of [place] in Chart? Yes  Pre-existing out of facility DNR order (yellow form or pink MOST form) Yellow form placed in chart (order not valid for inpatient use);Pink MOST form placed in chart (order not valid for inpatient use)     Chief Complaint  Patient presents with  . Acute Visit    purulent urine, frequency, decreased amt    HPI:  Pt is a 83 y.o. Small seen today for an acute visit for purulent urine, frequency, decreased urine amount, and increased delusional behavior.  She has a hx of prolapsed bladder with urinary retention and has I and O cath performed twice daily. She has recurrent UTI and is  followed by DR. Tannenbaum. She was treated for UTI 100,000 colonies of E.coli with Cipro on 5/15 by the ER.  During that time she had a delusions that she was rising off the bed and could not control the floating movements which was alarming to her. This symptoms went away after the treatment for the UTI but it was noted that she had similar behaviors at her home prior to this incident. The nurse reports that she is having the same sensation again. She is up every hr at night to urinate as well. There is some burning as well. The nurse reports when they catheterize her the amt has decreased to around 300 and is now quite purulent.   She started on Ellura for UTI prevention 3 days ago. Also takes Vit C 2000 mg qd for urine acidity and Hiprex 1 gram qd.   Past Medical History:  Diagnosis Date  . Anemia, unspecified   . Asymptomatic varicose veins   . Carotid bruit    hx of  . Carotid bruit    doppler normal in the past  . Diverticulosis   . Dyslipidemia   . Esophageal reflux   . Small bladder prolapse, acquired 07/17/2010  . Fibroid   . Full incontinence of feces   . Hemorrhoid    1963 surgical excision  . History of colon cancer    Adenocarcinoma of right colon, stage T2, N1.s/p resection/chemotherapy 2002  . HTN (hypertension)   . Hx of colonic polyps    1965 surgical excision  . Hypothyroidism   . Idiopathic osteoporosis   . Idiopathic osteoporosis   .  Internal hemorrhoids without mention of complication    bleed easily  . Irritable bowel syndrome   . Melanoma of skin, site unspecified   . MVP (mitral valve prolapse)   . OA (osteoarthritis)    left hip  . Osteoarthrosis, unspecified whether generalized or localized, pelvic region and thigh   . Peripheral neuropathy    hands/ feet  . 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 Spinal stenosis, unspecified region other than cervical   . Vaginal bleeding, abnormal   . Vaginitis, atrophic   . Varicose veins    Past Surgical  History:  Procedure Laterality Date  . COLONOSCOPY W/ POLYPECTOMY  2006   3 mm polyp destroyed, diverticulosis  . DILATION AND CURETTAGE OF UTERUS    . ESOPHAGOGASTRODUODENOSCOPY  2009   mild gastritis  . HEMICOLECTOMY  2002   right  . [place]  . HYSTEROSCOPY    . MELANOMA EXCISION      Allergies  Allergen Reactions  . Amoxil [Amoxicillin] Diarrhea    Has patient had a PCN reaction causing immediate rash, facial/tongue/throat swelling, SOB or lightheadedness with hypotension: no Has patient had a PCN reaction causing severe rash involving mucus membranes or skin necrosis: no Has patient had a PCN reaction that required hospitalization : no Has patient had a PCN reaction occurring within the last 10 years: no If all of the above answers are "NO", then may proceed with Cephalosporin use.   Otho Darner Allergy]     sensitivity  . Demerol     unknown  . Meperidine Hcl Other (See Comments)    Drop in blood pressure  . Infed [Iron Dextran] Rash    Became very red over face, scalp, and arms  . Neosporin [Neomycin-Bacitracin Zn-Polymyx] Rash    unknown    Outpatient Encounter Prescriptions as of 11/21/2016  Medication Sig  . Ascorbic Acid (VITAMIN C) 1000 MG tablet Take 2,000 mg by mouth daily.  . Cranberry (ELLURA PO) Take 1 capsule by mouth daily.  . phenazopyridine (PYRIDIUM) 100 MG tablet Take 100 mg by mouth 3 (three) times daily as needed for pain.  Marland Kitchen acetaminophen (TYLENOL) 650 MG CR tablet Take 650 mg by mouth 2 (two) times daily.  Marland Kitchen ALPRAZolam (XANAX) 0.25 MG tablet TAKE 1 TABLET AT BEDTIME AS NEEDED FOR ANXIETY.  Marland Kitchen antiseptic oral rinse (BIOTENE) LIQD 15 mLs by Mouth Rinse route 3 (three) times daily.  . Artificial Tear Ointment (ARTIFICIAL TEARS) ointment Place 2 drops into the left eye 2 (two) times daily.  . cholecalciferol (VITAMIN D) 1000 UNITS tablet Take 1 tablet (1,000 Units total) by mouth daily.  . hydrocortisone (ANUSOL-HC) 2.5 % rectal  cream Place 1 application rectally daily as needed for hemorrhoids or itching.   . levothyroxine (SYNTHROID, LEVOTHROID) 75 MCG tablet TAKE 1 TABLET DAILY BEFORE BREAKFAST FOR THYROID.  Marland Kitchen methenamine (HIPREX) 1 g tablet Take 1 g by mouth at bedtime.  . methylcellulose (CITRUCEL FIBERSHAKE) oral powder Take 1 packet by mouth daily.  Vladimir Faster Glycol-Propyl Glycol (SYSTANE) 0.4-0.3 % SOLN Place 2 drops into the right eye 2 (two) times daily.  . Potassium Chloride ER 20 MEQ TBCR Take 20 mEq by mouth 2 (two) times daily.  . potassium chloride SA (K-DUR,KLOR-CON) 20 MEQ tablet Take 20 mEq by mouth 2 (two) times daily.  . Probiotic Product (ALIGN) 4 MG CAPS Take 1 tablet by mouth daily.    . ranitidine (ZANTAC) 150 MG tablet Take 150 mg by mouth 2 (  two) times daily as needed for heartburn.   . simethicone (MYLICON) 333 MG chewable tablet Chew 125 mg by mouth 3 (three) times daily as needed for flatulence.  . [DISCONTINUED] Meth-Hyo-M Bl-Na Phos-Ph Sal (URIBEL PO) Take 1 capsule by mouth daily.    No facility-administered encounter medications on file as of 11/21/2016.     Review of Systems  Constitutional: Negative for activity change, appetite change, chills, diaphoresis, fatigue, fever and unexpected weight change.  HENT: Negative for congestion.   Respiratory: Negative for cough, shortness of breath and wheezing.   Cardiovascular: Negative for chest pain, palpitations and leg swelling.  Gastrointestinal: Negative for abdominal distention, abdominal pain, constipation, diarrhea and nausea.  Genitourinary: Positive for decreased urine volume, dysuria, frequency and urgency. Negative for difficulty urinating, flank pain, genital sores, hematuria, pelvic pain, vaginal bleeding, vaginal discharge and vaginal pain.  Musculoskeletal: Positive for arthralgias and gait problem. Negative for back pain, joint swelling and myalgias.  Neurological: Negative for dizziness, tremors, seizures, syncope, facial  asymmetry, speech difficulty, weakness, light-headedness, numbness and headaches.  Psychiatric/Behavioral: Positive for confusion. Negative for agitation and behavioral problems.       Delusion    Immunization History  Administered Date(s) Administered  . H1N1 05/30/2008  . Influenza Whole 03/17/2000, 03/05/2010, 03/17/2012  . Influenza,inj,Quad PF,36+ Mos 03/14/2015  . Influenza-Unspecified 04/16/2013, 03/31/2014, 04/11/2016  . Pneumococcal Conjugate-13 09/08/2015  . Pneumococcal Polysaccharide-23 03/17/1997  . Tdap 10/09/2011   Pertinent  Health Maintenance Due  Topic Date Due  . INFLUENZA VACCINE  01/15/2017  . DEXA SCAN  Completed  . PNA vac Low Risk Adult  Completed   Fall Risk  10/10/2016 08/28/2016 05/15/2016 02/14/2016 02/01/2016  Falls in the past year? Yes Yes No No No  Number falls in past yr: 1 1 - - -  Injury with Fall? - No - - -   Functional Status Survey:    Vitals:   11/21/16 1232  BP: 110/73  Pulse: 73  Resp: 20  Temp: [name] F (36.1 C)  SpO2: 96%  Weight: 92 lb 9.6 oz (42 kg)   Body mass index is 20.76 kg/m. Physical Exam  Constitutional: She is oriented to person, place, and time. No distress.  HENT:  Head: Normocephalic and atraumatic.  Neck: No JVD present.  Cardiovascular: Normal rate and regular rhythm.   No murmur heard. Pulmonary/Chest: Effort normal. No respiratory distress. She has wheezes.  Abdominal: Soft. Bowel sounds are normal. She exhibits no distension.  Genitourinary: Rectum normal. There is no rash, tenderness, lesion or injury on the right labia. There is no rash, tenderness, lesion or injury on the left labia. No erythema, tenderness or bleeding in the vagina. No foreign body in the vagina. No signs of injury around the vagina. No vaginal discharge found.  Neurological: She is alert and oriented to person, place, and time.  Confused about the details of her care and did not remember me.   Skin: Skin is warm and dry. She is not  diaphoretic.  Psychiatric: She has a normal mood and affect.    Labs reviewed:  Recent Labs  12/01/15 1937 10/04/16 10/29/16 1220  NA 125* 136* 136  K 3.2* 3.1* 3.1*  CL 90*  --  99*  CO2 24  --  28  GLUCOSE 127*  --  95  BUN 12 12 13   CREATININE 0.51 0.5 0.58  CALCIUM 8.3*  --  8.7*    Recent Labs  12/01/15 1937 10/04/16 10/29/16 1220  AST 37 31  39  ALT 21 16 18   ALKPHOS 75 94 79  BILITOT 1.4*  --  0.8  PROT 6.6  --  6.7  ALBUMIN 3.3*  --  3.3*    Recent Labs  12/01/15 1937 10/04/16 10/29/16 1220  WBC 11.8* 5.8 4.4  NEUTROABS 10.5*  --  2.9  HGB 10.5* 11.3* 11.0*  HCT 30.6* 33* 33.4*  MCV 89.5  --  91.3  PLT 181 215 215   Lab Results  Component Value Date   TSH 1.18 04/25/2015   Lab Results  Component Value Date   HGBA1C 5.9 12/02/2006   Lab Results  Component Value Date   CHOL 174 11/07/2015   HDL 79 (A) 11/07/2015   LDLCALC 78 11/07/2015   LDLDIRECT 123.7 12/08/2007   TRIG 87 11/07/2015   CHOLHDL 3 07/16/2011    Significant Diagnostic Results in last 30 days:  No results found.  Assessment/Plan  1. Urinary retention Continue catheterization BID, f/u with Tannenbaum as indicated  2. Small bladder prolapse, acquired Not notable one exam externally  3. VAGINITIS, ATROPHIC Not currently on medication Was not notable during my exam  4. Recurrent UTI Continue Ellura and Vit C Check UA C and S as she is having multiple symptoms  Family/ staff Communication: discussed with resident and staff  Labs/tests ordered:  UA C and S

## 2016-11-22 DIAGNOSIS — R2689 Other abnormalities of gait and mobility: Secondary | ICD-10-CM | POA: Diagnosis not present

## 2016-11-22 DIAGNOSIS — R300 Dysuria: Secondary | ICD-10-CM | POA: Diagnosis not present

## 2016-11-22 DIAGNOSIS — E876 Hypokalemia: Secondary | ICD-10-CM | POA: Diagnosis not present

## 2016-11-22 DIAGNOSIS — R278 Other lack of coordination: Secondary | ICD-10-CM | POA: Diagnosis not present

## 2016-11-22 DIAGNOSIS — N390 Urinary tract infection, site not specified: Secondary | ICD-10-CM | POA: Diagnosis not present

## 2016-11-22 DIAGNOSIS — M21372 Foot drop, left foot: Secondary | ICD-10-CM | POA: Diagnosis not present

## 2016-11-22 DIAGNOSIS — Z79899 Other long term (current) drug therapy: Secondary | ICD-10-CM | POA: Diagnosis not present

## 2016-11-22 DIAGNOSIS — G609 Hereditary and idiopathic neuropathy, unspecified: Secondary | ICD-10-CM | POA: Diagnosis not present

## 2016-11-22 DIAGNOSIS — M6389 Disorders of muscle in diseases classified elsewhere, multiple sites: Secondary | ICD-10-CM | POA: Diagnosis not present

## 2016-11-22 DIAGNOSIS — R319 Hematuria, unspecified: Secondary | ICD-10-CM | POA: Diagnosis not present

## 2016-11-23 DIAGNOSIS — R2689 Other abnormalities of gait and mobility: Secondary | ICD-10-CM | POA: Diagnosis not present

## 2016-11-23 DIAGNOSIS — R278 Other lack of coordination: Secondary | ICD-10-CM | POA: Diagnosis not present

## 2016-11-23 DIAGNOSIS — G609 Hereditary and idiopathic neuropathy, unspecified: Secondary | ICD-10-CM | POA: Diagnosis not present

## 2016-11-23 DIAGNOSIS — E876 Hypokalemia: Secondary | ICD-10-CM | POA: Diagnosis not present

## 2016-11-23 DIAGNOSIS — M21372 Foot drop, left foot: Secondary | ICD-10-CM | POA: Diagnosis not present

## 2016-11-23 DIAGNOSIS — M6389 Disorders of muscle in diseases classified elsewhere, multiple sites: Secondary | ICD-10-CM | POA: Diagnosis not present

## 2016-11-25 DIAGNOSIS — R278 Other lack of coordination: Secondary | ICD-10-CM | POA: Diagnosis not present

## 2016-11-25 DIAGNOSIS — R2689 Other abnormalities of gait and mobility: Secondary | ICD-10-CM | POA: Diagnosis not present

## 2016-11-25 DIAGNOSIS — M21372 Foot drop, left foot: Secondary | ICD-10-CM | POA: Diagnosis not present

## 2016-11-25 DIAGNOSIS — M6389 Disorders of muscle in diseases classified elsewhere, multiple sites: Secondary | ICD-10-CM | POA: Diagnosis not present

## 2016-11-25 DIAGNOSIS — E876 Hypokalemia: Secondary | ICD-10-CM | POA: Diagnosis not present

## 2016-11-25 DIAGNOSIS — G609 Hereditary and idiopathic neuropathy, unspecified: Secondary | ICD-10-CM | POA: Diagnosis not present

## 2016-11-26 DIAGNOSIS — G609 Hereditary and idiopathic neuropathy, unspecified: Secondary | ICD-10-CM | POA: Diagnosis not present

## 2016-11-26 DIAGNOSIS — R2689 Other abnormalities of gait and mobility: Secondary | ICD-10-CM | POA: Diagnosis not present

## 2016-11-26 DIAGNOSIS — E876 Hypokalemia: Secondary | ICD-10-CM | POA: Diagnosis not present

## 2016-11-26 DIAGNOSIS — M21372 Foot drop, left foot: Secondary | ICD-10-CM | POA: Diagnosis not present

## 2016-11-26 DIAGNOSIS — M6389 Disorders of muscle in diseases classified elsewhere, multiple sites: Secondary | ICD-10-CM | POA: Diagnosis not present

## 2016-11-26 DIAGNOSIS — R278 Other lack of coordination: Secondary | ICD-10-CM | POA: Diagnosis not present

## 2016-11-27 ENCOUNTER — Encounter: Payer: Medicare Other | Admitting: Internal Medicine

## 2016-11-27 DIAGNOSIS — M21372 Foot drop, left foot: Secondary | ICD-10-CM | POA: Diagnosis not present

## 2016-11-27 DIAGNOSIS — R278 Other lack of coordination: Secondary | ICD-10-CM | POA: Diagnosis not present

## 2016-11-27 DIAGNOSIS — M6389 Disorders of muscle in diseases classified elsewhere, multiple sites: Secondary | ICD-10-CM | POA: Diagnosis not present

## 2016-11-27 DIAGNOSIS — E876 Hypokalemia: Secondary | ICD-10-CM | POA: Diagnosis not present

## 2016-11-27 DIAGNOSIS — G609 Hereditary and idiopathic neuropathy, unspecified: Secondary | ICD-10-CM | POA: Diagnosis not present

## 2016-11-27 DIAGNOSIS — R2689 Other abnormalities of gait and mobility: Secondary | ICD-10-CM | POA: Diagnosis not present

## 2016-11-29 DIAGNOSIS — R2689 Other abnormalities of gait and mobility: Secondary | ICD-10-CM | POA: Diagnosis not present

## 2016-11-29 DIAGNOSIS — M21372 Foot drop, left foot: Secondary | ICD-10-CM | POA: Diagnosis not present

## 2016-11-29 DIAGNOSIS — G609 Hereditary and idiopathic neuropathy, unspecified: Secondary | ICD-10-CM | POA: Diagnosis not present

## 2016-11-29 DIAGNOSIS — M6389 Disorders of muscle in diseases classified elsewhere, multiple sites: Secondary | ICD-10-CM | POA: Diagnosis not present

## 2016-11-29 DIAGNOSIS — R278 Other lack of coordination: Secondary | ICD-10-CM | POA: Diagnosis not present

## 2016-11-29 DIAGNOSIS — E876 Hypokalemia: Secondary | ICD-10-CM | POA: Diagnosis not present

## 2016-12-02 DIAGNOSIS — R278 Other lack of coordination: Secondary | ICD-10-CM | POA: Diagnosis not present

## 2016-12-02 DIAGNOSIS — M6389 Disorders of muscle in diseases classified elsewhere, multiple sites: Secondary | ICD-10-CM | POA: Diagnosis not present

## 2016-12-02 DIAGNOSIS — M21372 Foot drop, left foot: Secondary | ICD-10-CM | POA: Diagnosis not present

## 2016-12-02 DIAGNOSIS — R2689 Other abnormalities of gait and mobility: Secondary | ICD-10-CM | POA: Diagnosis not present

## 2016-12-02 DIAGNOSIS — E876 Hypokalemia: Secondary | ICD-10-CM | POA: Diagnosis not present

## 2016-12-02 DIAGNOSIS — G609 Hereditary and idiopathic neuropathy, unspecified: Secondary | ICD-10-CM | POA: Diagnosis not present

## 2016-12-03 DIAGNOSIS — M21372 Foot drop, left foot: Secondary | ICD-10-CM | POA: Diagnosis not present

## 2016-12-03 DIAGNOSIS — M6389 Disorders of muscle in diseases classified elsewhere, multiple sites: Secondary | ICD-10-CM | POA: Diagnosis not present

## 2016-12-03 DIAGNOSIS — E876 Hypokalemia: Secondary | ICD-10-CM | POA: Diagnosis not present

## 2016-12-03 DIAGNOSIS — G609 Hereditary and idiopathic neuropathy, unspecified: Secondary | ICD-10-CM | POA: Diagnosis not present

## 2016-12-03 DIAGNOSIS — R278 Other lack of coordination: Secondary | ICD-10-CM | POA: Diagnosis not present

## 2016-12-03 DIAGNOSIS — R2689 Other abnormalities of gait and mobility: Secondary | ICD-10-CM | POA: Diagnosis not present

## 2016-12-04 DIAGNOSIS — E876 Hypokalemia: Secondary | ICD-10-CM | POA: Diagnosis not present

## 2016-12-04 DIAGNOSIS — M6389 Disorders of muscle in diseases classified elsewhere, multiple sites: Secondary | ICD-10-CM | POA: Diagnosis not present

## 2016-12-04 DIAGNOSIS — G609 Hereditary and idiopathic neuropathy, unspecified: Secondary | ICD-10-CM | POA: Diagnosis not present

## 2016-12-04 DIAGNOSIS — R278 Other lack of coordination: Secondary | ICD-10-CM | POA: Diagnosis not present

## 2016-12-04 DIAGNOSIS — R2689 Other abnormalities of gait and mobility: Secondary | ICD-10-CM | POA: Diagnosis not present

## 2016-12-04 DIAGNOSIS — M21372 Foot drop, left foot: Secondary | ICD-10-CM | POA: Diagnosis not present

## 2016-12-05 DIAGNOSIS — M6389 Disorders of muscle in diseases classified elsewhere, multiple sites: Secondary | ICD-10-CM | POA: Diagnosis not present

## 2016-12-05 DIAGNOSIS — G609 Hereditary and idiopathic neuropathy, unspecified: Secondary | ICD-10-CM | POA: Diagnosis not present

## 2016-12-05 DIAGNOSIS — R278 Other lack of coordination: Secondary | ICD-10-CM | POA: Diagnosis not present

## 2016-12-05 DIAGNOSIS — R2689 Other abnormalities of gait and mobility: Secondary | ICD-10-CM | POA: Diagnosis not present

## 2016-12-05 DIAGNOSIS — M21372 Foot drop, left foot: Secondary | ICD-10-CM | POA: Diagnosis not present

## 2016-12-05 DIAGNOSIS — E876 Hypokalemia: Secondary | ICD-10-CM | POA: Diagnosis not present

## 2016-12-06 DIAGNOSIS — R278 Other lack of coordination: Secondary | ICD-10-CM | POA: Diagnosis not present

## 2016-12-06 DIAGNOSIS — M6389 Disorders of muscle in diseases classified elsewhere, multiple sites: Secondary | ICD-10-CM | POA: Diagnosis not present

## 2016-12-06 DIAGNOSIS — R2689 Other abnormalities of gait and mobility: Secondary | ICD-10-CM | POA: Diagnosis not present

## 2016-12-06 DIAGNOSIS — G609 Hereditary and idiopathic neuropathy, unspecified: Secondary | ICD-10-CM | POA: Diagnosis not present

## 2016-12-06 DIAGNOSIS — E876 Hypokalemia: Secondary | ICD-10-CM | POA: Diagnosis not present

## 2016-12-06 DIAGNOSIS — M21372 Foot drop, left foot: Secondary | ICD-10-CM | POA: Diagnosis not present

## 2016-12-09 DIAGNOSIS — N312 Flaccid neuropathic bladder, not elsewhere classified: Secondary | ICD-10-CM | POA: Diagnosis not present

## 2016-12-09 DIAGNOSIS — E876 Hypokalemia: Secondary | ICD-10-CM | POA: Diagnosis not present

## 2016-12-09 DIAGNOSIS — D3000 Benign neoplasm of unspecified kidney: Secondary | ICD-10-CM | POA: Diagnosis not present

## 2016-12-09 DIAGNOSIS — G609 Hereditary and idiopathic neuropathy, unspecified: Secondary | ICD-10-CM | POA: Diagnosis not present

## 2016-12-09 DIAGNOSIS — M21372 Foot drop, left foot: Secondary | ICD-10-CM | POA: Diagnosis not present

## 2016-12-09 DIAGNOSIS — N952 Postmenopausal atrophic vaginitis: Secondary | ICD-10-CM | POA: Diagnosis not present

## 2016-12-09 DIAGNOSIS — M6389 Disorders of muscle in diseases classified elsewhere, multiple sites: Secondary | ICD-10-CM | POA: Diagnosis not present

## 2016-12-09 DIAGNOSIS — R278 Other lack of coordination: Secondary | ICD-10-CM | POA: Diagnosis not present

## 2016-12-09 DIAGNOSIS — R2689 Other abnormalities of gait and mobility: Secondary | ICD-10-CM | POA: Diagnosis not present

## 2016-12-10 DIAGNOSIS — G609 Hereditary and idiopathic neuropathy, unspecified: Secondary | ICD-10-CM | POA: Diagnosis not present

## 2016-12-10 DIAGNOSIS — R2689 Other abnormalities of gait and mobility: Secondary | ICD-10-CM | POA: Diagnosis not present

## 2016-12-10 DIAGNOSIS — R278 Other lack of coordination: Secondary | ICD-10-CM | POA: Diagnosis not present

## 2016-12-10 DIAGNOSIS — M21372 Foot drop, left foot: Secondary | ICD-10-CM | POA: Diagnosis not present

## 2016-12-10 DIAGNOSIS — M6389 Disorders of muscle in diseases classified elsewhere, multiple sites: Secondary | ICD-10-CM | POA: Diagnosis not present

## 2016-12-10 DIAGNOSIS — E876 Hypokalemia: Secondary | ICD-10-CM | POA: Diagnosis not present

## 2016-12-11 DIAGNOSIS — G609 Hereditary and idiopathic neuropathy, unspecified: Secondary | ICD-10-CM | POA: Diagnosis not present

## 2016-12-11 DIAGNOSIS — M21372 Foot drop, left foot: Secondary | ICD-10-CM | POA: Diagnosis not present

## 2016-12-11 DIAGNOSIS — M6389 Disorders of muscle in diseases classified elsewhere, multiple sites: Secondary | ICD-10-CM | POA: Diagnosis not present

## 2016-12-11 DIAGNOSIS — R2689 Other abnormalities of gait and mobility: Secondary | ICD-10-CM | POA: Diagnosis not present

## 2016-12-11 DIAGNOSIS — R278 Other lack of coordination: Secondary | ICD-10-CM | POA: Diagnosis not present

## 2016-12-11 DIAGNOSIS — E876 Hypokalemia: Secondary | ICD-10-CM | POA: Diagnosis not present

## 2016-12-12 DIAGNOSIS — G609 Hereditary and idiopathic neuropathy, unspecified: Secondary | ICD-10-CM | POA: Diagnosis not present

## 2016-12-12 DIAGNOSIS — R2689 Other abnormalities of gait and mobility: Secondary | ICD-10-CM | POA: Diagnosis not present

## 2016-12-12 DIAGNOSIS — E876 Hypokalemia: Secondary | ICD-10-CM | POA: Diagnosis not present

## 2016-12-12 DIAGNOSIS — M6389 Disorders of muscle in diseases classified elsewhere, multiple sites: Secondary | ICD-10-CM | POA: Diagnosis not present

## 2016-12-12 DIAGNOSIS — R278 Other lack of coordination: Secondary | ICD-10-CM | POA: Diagnosis not present

## 2016-12-12 DIAGNOSIS — M21372 Foot drop, left foot: Secondary | ICD-10-CM | POA: Diagnosis not present

## 2016-12-13 DIAGNOSIS — E876 Hypokalemia: Secondary | ICD-10-CM | POA: Diagnosis not present

## 2016-12-13 DIAGNOSIS — M6389 Disorders of muscle in diseases classified elsewhere, multiple sites: Secondary | ICD-10-CM | POA: Diagnosis not present

## 2016-12-13 DIAGNOSIS — R278 Other lack of coordination: Secondary | ICD-10-CM | POA: Diagnosis not present

## 2016-12-13 DIAGNOSIS — G609 Hereditary and idiopathic neuropathy, unspecified: Secondary | ICD-10-CM | POA: Diagnosis not present

## 2016-12-13 DIAGNOSIS — M21372 Foot drop, left foot: Secondary | ICD-10-CM | POA: Diagnosis not present

## 2016-12-13 DIAGNOSIS — R2689 Other abnormalities of gait and mobility: Secondary | ICD-10-CM | POA: Diagnosis not present

## 2016-12-15 DIAGNOSIS — R339 Retention of urine, unspecified: Secondary | ICD-10-CM | POA: Diagnosis not present

## 2016-12-15 DIAGNOSIS — R319 Hematuria, unspecified: Secondary | ICD-10-CM | POA: Diagnosis not present

## 2016-12-15 DIAGNOSIS — Z79899 Other long term (current) drug therapy: Secondary | ICD-10-CM | POA: Diagnosis not present

## 2016-12-15 DIAGNOSIS — N390 Urinary tract infection, site not specified: Secondary | ICD-10-CM | POA: Diagnosis not present

## 2016-12-16 DIAGNOSIS — Z9181 History of falling: Secondary | ICD-10-CM | POA: Diagnosis not present

## 2016-12-16 DIAGNOSIS — R278 Other lack of coordination: Secondary | ICD-10-CM | POA: Diagnosis not present

## 2016-12-16 DIAGNOSIS — E876 Hypokalemia: Secondary | ICD-10-CM | POA: Diagnosis not present

## 2016-12-16 DIAGNOSIS — N390 Urinary tract infection, site not specified: Secondary | ICD-10-CM | POA: Diagnosis not present

## 2016-12-16 DIAGNOSIS — M6389 Disorders of muscle in diseases classified elsewhere, multiple sites: Secondary | ICD-10-CM | POA: Diagnosis not present

## 2016-12-17 DIAGNOSIS — R278 Other lack of coordination: Secondary | ICD-10-CM | POA: Diagnosis not present

## 2016-12-17 DIAGNOSIS — E876 Hypokalemia: Secondary | ICD-10-CM | POA: Diagnosis not present

## 2016-12-17 DIAGNOSIS — N390 Urinary tract infection, site not specified: Secondary | ICD-10-CM | POA: Diagnosis not present

## 2016-12-17 DIAGNOSIS — M6389 Disorders of muscle in diseases classified elsewhere, multiple sites: Secondary | ICD-10-CM | POA: Diagnosis not present

## 2016-12-17 DIAGNOSIS — Z9181 History of falling: Secondary | ICD-10-CM | POA: Diagnosis not present

## 2016-12-18 DIAGNOSIS — E876 Hypokalemia: Secondary | ICD-10-CM | POA: Diagnosis not present

## 2016-12-18 DIAGNOSIS — M6389 Disorders of muscle in diseases classified elsewhere, multiple sites: Secondary | ICD-10-CM | POA: Diagnosis not present

## 2016-12-18 DIAGNOSIS — Z9181 History of falling: Secondary | ICD-10-CM | POA: Diagnosis not present

## 2016-12-18 DIAGNOSIS — R278 Other lack of coordination: Secondary | ICD-10-CM | POA: Diagnosis not present

## 2016-12-18 DIAGNOSIS — N390 Urinary tract infection, site not specified: Secondary | ICD-10-CM | POA: Diagnosis not present

## 2016-12-19 ENCOUNTER — Encounter: Payer: Self-pay | Admitting: Adult Health

## 2016-12-19 ENCOUNTER — Non-Acute Institutional Stay: Payer: Medicare Other | Admitting: Adult Health

## 2016-12-19 DIAGNOSIS — N390 Urinary tract infection, site not specified: Secondary | ICD-10-CM | POA: Diagnosis not present

## 2016-12-19 DIAGNOSIS — E876 Hypokalemia: Secondary | ICD-10-CM | POA: Diagnosis not present

## 2016-12-19 DIAGNOSIS — W19XXXA Unspecified fall, initial encounter: Secondary | ICD-10-CM | POA: Diagnosis not present

## 2016-12-19 DIAGNOSIS — Z9181 History of falling: Secondary | ICD-10-CM | POA: Diagnosis not present

## 2016-12-19 DIAGNOSIS — R339 Retention of urine, unspecified: Secondary | ICD-10-CM | POA: Diagnosis not present

## 2016-12-19 DIAGNOSIS — N3000 Acute cystitis without hematuria: Secondary | ICD-10-CM

## 2016-12-19 DIAGNOSIS — M6389 Disorders of muscle in diseases classified elsewhere, multiple sites: Secondary | ICD-10-CM | POA: Diagnosis not present

## 2016-12-19 DIAGNOSIS — R278 Other lack of coordination: Secondary | ICD-10-CM | POA: Diagnosis not present

## 2016-12-19 DIAGNOSIS — N3945 Continuous leakage: Secondary | ICD-10-CM

## 2016-12-19 NOTE — Progress Notes (Signed)
Location:  Occupational psychologist of Service:  ALF (13) Provider:   Cindi Carbon, ANP [place] [phone]  Gayland Curry, DO  Patient Care Team: Gayland Curry, DO as PCP - General (Geriatric Medicine) Terrance Mass, MD as Consulting Physician (Gynecology) Shon Hough, MD as Consulting Physician (Ophthalmology) Carolan Clines, MD as Consulting Physician (Urology) Gatha Mayer, MD as Consulting Physician (Gastroenterology) Wyatt Portela, MD as Consulting Physician (Oncology)  Extended Emergency Contact Information Primary Emergency Contact: Fox,Richard (Dr) Address: [address]          [place], [place] Phone: [phone] Work Phone: [phone] Relation: Alanson Puls Secondary Emergency Contact: Claudell Kyle Address: [place] [address] Johnnette Litter of [place] Mobile Phone: [phone] Relation: Son  Code Status:  DNR Goals of care: Advanced Directive information Advanced Directives 11/05/2016  Does Patient Have a Medical Advance Directive? Yes  Type of Advance Directive Out of facility DNR (pink MOST or yellow form);Living will;Healthcare Power of Attorney  Does patient want to make changes to medical advance directive? -  Copy of [place] in Chart? Yes  Pre-existing out of facility DNR order (yellow form or pink MOST form) Yellow form placed in chart (order not valid for inpatient use);Pink MOST form placed in chart (order not valid for inpatient use)     Chief Complaint  Patient presents with  . Acute Visit    fall, increased incontinence, positive urine    HPI:  Pt is a 83 y.o. female seen today for an acute visit for fall, increased incontinence, and positive UA.  The staff reported that she was up multiple times in the night earlier this week to urinate and when being catheterized the urine appeared purulent.  A UA was obtained  and the final urine came back showing >100,000 colonies of E.Coli (collected 7/2).  She has chronic retention of urine with bladder prolapse and is cathed BID.  She recently moved to [place] and the staff report that she is frequently incontinent (worse than usual) and wears pads to help with this but does not remember to change them and keep proper hygiene.  On 7/4 she was found on the floor and hit the left cheek area with a small abrasion. No change in cognition or other injuries were observed.  The fall was not observed but felt to be mechanical. The resident reports lower abd pain at times and frequency but no burning.  She has not had a fever. She was treated for a UTI a few weeks ago with macrobid.   She has some memory loss and it is difficult to obtain a hx in straight forward fashion.   The staff is concerned that if she can not manage her incontinence she is not technically AL appropriate.   UA 7/2: >100,000 colonies of E.Coli, 3+ leuk esterase, pos nitrite, 2+ blood, WBC 50-100 2+bacteria  Past Medical History:  Diagnosis Date  . Anemia, unspecified   . Asymptomatic varicose veins   . Carotid bruit    hx of  . Carotid bruit    doppler normal in the past  . Diverticulosis   . Dyslipidemia   . Esophageal reflux   . Female bladder prolapse, acquired 07/17/2010  . Fibroid   . Full incontinence of feces   . Hemorrhoid    1963 surgical excision  . History of colon  cancer    Adenocarcinoma of right colon, stage T2, N1.s/p resection/chemotherapy 2002  . HTN (hypertension)   . Hx of colonic polyps    1965 surgical excision  . Hypothyroidism   . Idiopathic osteoporosis   . Idiopathic osteoporosis   . Internal hemorrhoids without mention of complication    bleed easily  . Irritable bowel syndrome   . Melanoma of skin, site unspecified   . MVP (mitral valve prolapse)   . OA (osteoarthritis)    left hip  . Osteoarthrosis, unspecified whether generalized or localized, pelvic region and  thigh   . Peripheral neuropathy    hands/ feet  . 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 Spinal stenosis, unspecified region other than cervical   . Vaginal bleeding, abnormal   . Vaginitis, atrophic   . Varicose veins    Past Surgical History:  Procedure Laterality Date  . COLONOSCOPY W/ POLYPECTOMY  2006   3 mm polyp destroyed, diverticulosis  . DILATION AND CURETTAGE OF UTERUS    . ESOPHAGOGASTRODUODENOSCOPY  2009   mild gastritis  . HEMICOLECTOMY  2002   right  . [place]  . HYSTEROSCOPY    . MELANOMA EXCISION      Allergies  Allergen Reactions  . Amoxil [Amoxicillin] Diarrhea    Has patient had a PCN reaction causing immediate rash, facial/tongue/throat swelling, SOB or lightheadedness with hypotension: no Has patient had a PCN reaction causing severe rash involving mucus membranes or skin necrosis: no Has patient had a PCN reaction that required hospitalization : no Has patient had a PCN reaction occurring within the last 10 years: no If all of the above answers are "NO", then may proceed with Cephalosporin use.   Otho Darner Allergy]     sensitivity  . Demerol     unknown  . Meperidine Hcl Other (See Comments)    Drop in blood pressure  . Infed [Iron Dextran] Rash    Became very red over face, scalp, and arms  . Neosporin [Neomycin-Bacitracin Zn-Polymyx] Rash    unknown    Outpatient Encounter Prescriptions as of 12/19/2016  Medication Sig  . acetaminophen (TYLENOL) 650 MG CR tablet Take 650 mg by mouth 2 (two) times daily.  Marland Kitchen ALPRAZolam (XANAX) 0.25 MG tablet TAKE 1 TABLET AT BEDTIME AS NEEDED FOR ANXIETY.  Marland Kitchen antiseptic oral rinse (BIOTENE) LIQD 15 mLs by Mouth Rinse route 3 (three) times daily.  . Artificial Tear Ointment (ARTIFICIAL TEARS) ointment Place 2 drops into the left eye 2 (two) times daily.  . Ascorbic Acid (VITAMIN C) 1000 MG tablet Take 2,000 mg by mouth daily.  . cholecalciferol (VITAMIN D) 1000 UNITS tablet Take 1 tablet  (1,000 Units total) by mouth daily.  . Cranberry (ELLURA PO) Take 1 capsule by mouth daily.  . hydrocortisone (ANUSOL-HC) 2.5 % rectal cream Place 1 application rectally daily as needed for hemorrhoids or itching.   . levothyroxine (SYNTHROID, LEVOTHROID) 75 MCG tablet TAKE 1 TABLET DAILY BEFORE BREAKFAST FOR THYROID.  Marland Kitchen methenamine (HIPREX) 1 g tablet Take 1 g by mouth at bedtime.  . methylcellulose (CITRUCEL FIBERSHAKE) oral powder Take 1 packet by mouth daily.  . phenazopyridine (PYRIDIUM) 100 MG tablet Take 100 mg by mouth 3 (three) times daily as needed for pain.  Vladimir Faster Glycol-Propyl Glycol (SYSTANE) 0.4-0.3 % SOLN Place 2 drops into the right eye 2 (two) times daily.  . Potassium Chloride ER 20 MEQ TBCR Take 20 mEq by mouth 2 (two) times daily.  Marland Kitchen  potassium chloride SA (K-DUR,KLOR-CON) 20 MEQ tablet Take 20 mEq by mouth 2 (two) times daily.  . Probiotic Product (ALIGN) 4 MG CAPS Take 1 tablet by mouth daily.    . ranitidine (ZANTAC) 150 MG tablet Take 150 mg by mouth 2 (two) times daily as needed for heartburn.   . simethicone (MYLICON) 520 MG chewable tablet Chew 125 mg by mouth 3 (three) times daily as needed for flatulence.   No facility-administered encounter medications on file as of 12/19/2016.     Review of Systems  Constitutional: Negative for activity change, appetite change, chills, diaphoresis, fatigue, fever and unexpected weight change.       Feels tired  HENT: Negative for congestion.   Respiratory: Negative for cough, shortness of breath and wheezing.   Cardiovascular: Negative for chest pain, palpitations and leg swelling.  Gastrointestinal: Negative for abdominal distention, abdominal pain, constipation and diarrhea.  Genitourinary: Positive for frequency, pelvic pain and urgency. Negative for decreased urine volume, difficulty urinating, dysuria, flank pain, genital sores, hematuria, menstrual problem, vaginal bleeding, vaginal discharge and vaginal pain.    Musculoskeletal: Positive for arthralgias and gait problem. Negative for back pain, joint swelling and myalgias.  Neurological: Negative for dizziness, tremors, seizures, syncope, facial asymmetry, speech difficulty, weakness, light-headedness, numbness and headaches.  Psychiatric/Behavioral: Negative for agitation, behavioral problems and confusion.       Memory loss    Immunization History  Administered Date(s) Administered  . H1N1 05/30/2008  . Influenza Whole 03/17/2000, 03/05/2010, 03/17/2012  . Influenza,inj,Quad PF,36+ Mos 03/14/2015  . Influenza-Unspecified 04/16/2013, 03/31/2014, 04/11/2016  . Pneumococcal Conjugate-13 09/08/2015  . Pneumococcal Polysaccharide-23 03/17/1997  . Tdap 10/09/2011   Pertinent  Health Maintenance Due  Topic Date Due  . INFLUENZA VACCINE  01/15/2017  . DEXA SCAN  Completed  . PNA vac Low Risk Adult  Completed   Fall Risk  10/10/2016 08/28/2016 05/15/2016 02/14/2016 02/01/2016  Falls in the past year? Yes Yes No No No  Number falls in past yr: 1 1 - - -  Injury with Fall? - No - - -   Functional Status Survey:    Vitals:   12/19/16 1613  BP: (!) 101/53  Pulse: 63  Resp: 20  Temp: (!) 97.5 F (36.4 C)  SpO2: 100%   There is no height or weight on file to calculate BMI. Physical Exam  Constitutional: She is oriented to person, place, and time. No distress.  HENT:  Head: Normocephalic and atraumatic.  Mouth/Throat: No oropharyngeal exudate.  Neck: No JVD present.  Cardiovascular: Normal rate and regular rhythm.   No murmur heard. Pulmonary/Chest: Effort normal. No respiratory distress. She has wheezes.  Abdominal: Soft. Bowel sounds are normal. She exhibits no distension. There is no tenderness.  No cva or S/P tenderness  Musculoskeletal: She exhibits no edema or tenderness.  Neurological: She is alert and oriented to person, place, and time.  Skin: Skin is warm and dry. She is not diaphoretic.  Left cheek abrasion with dressing CDI,  no drainage or erythema  Psychiatric: She has a normal mood and affect.  Nursing note and vitals reviewed.   Labs reviewed:  Recent Labs  10/04/16 10/29/16 1220  NA 136* 136  K 3.1* 3.1*  CL  --  99*  CO2  --  28  GLUCOSE  --  95  BUN 12 13  CREATININE 0.5 0.58  CALCIUM  --  8.7*    Recent Labs  10/04/16 10/29/16 1220  AST 31 39  ALT 16  18  ALKPHOS 94 79  BILITOT  --  0.8  PROT  --  6.7  ALBUMIN  --  3.3*    Recent Labs  10/04/16 10/29/16 1220  WBC 5.8 4.4  NEUTROABS  --  2.9  HGB 11.3* 11.0*  HCT 33* 33.4*  MCV  --  91.3  PLT 215 215   Lab Results  Component Value Date   TSH 1.18 04/25/2015   Lab Results  Component Value Date   HGBA1C 5.9 12/02/2006   Lab Results  Component Value Date   CHOL 174 11/07/2015   HDL 79 (A) 11/07/2015   LDLCALC 78 11/07/2015   LDLDIRECT 123.7 12/08/2007   TRIG 87 11/07/2015   CHOLHDL 3 07/16/2011    Significant Diagnostic Results in last 30 days:  No results found.  Assessment/Plan  1. Acute cystitis without hematuria Due to symptoms listed above I will go ahead and treat but we will have to be diligent in monitoring her symptoms and ensuring proper criteria are met to avoid over use of antibiotics. I have asked the staff to chart urinary symptoms in matrix and report them to [facility] if there are changes for greater consistency.   Recently took macrobid with a sensitivity less than 20, will try Cipro 250 mg BID for 7 days Hold hiprex while on this regimen ?? If premarin would help   2. Urinary retention Cath BID, follow up with urology as indicated  3. Continuous leakage of urine Worsening incontinence over time most likely due to bladder prolapse/age/memory loss Orders indicate to avoid anticholinergics Home care will be involved to help her manage this issue but overall may be more appropriate for AL enhanced  4. Fall, initial encounter No major injuries Has left foot drop which may have contributed Continue  fall prec and use walker   Family/ staff Communication: discussed with staff  Labs/tests ordered:  NA

## 2016-12-20 DIAGNOSIS — E876 Hypokalemia: Secondary | ICD-10-CM | POA: Diagnosis not present

## 2016-12-20 DIAGNOSIS — R278 Other lack of coordination: Secondary | ICD-10-CM | POA: Diagnosis not present

## 2016-12-20 DIAGNOSIS — Z9181 History of falling: Secondary | ICD-10-CM | POA: Diagnosis not present

## 2016-12-20 DIAGNOSIS — N390 Urinary tract infection, site not specified: Secondary | ICD-10-CM | POA: Diagnosis not present

## 2016-12-20 DIAGNOSIS — M6389 Disorders of muscle in diseases classified elsewhere, multiple sites: Secondary | ICD-10-CM | POA: Diagnosis not present

## 2016-12-23 DIAGNOSIS — N390 Urinary tract infection, site not specified: Secondary | ICD-10-CM | POA: Diagnosis not present

## 2016-12-23 DIAGNOSIS — E876 Hypokalemia: Secondary | ICD-10-CM | POA: Diagnosis not present

## 2016-12-23 DIAGNOSIS — M6389 Disorders of muscle in diseases classified elsewhere, multiple sites: Secondary | ICD-10-CM | POA: Diagnosis not present

## 2016-12-23 DIAGNOSIS — Z9181 History of falling: Secondary | ICD-10-CM | POA: Diagnosis not present

## 2016-12-23 DIAGNOSIS — R278 Other lack of coordination: Secondary | ICD-10-CM | POA: Diagnosis not present

## 2016-12-24 DIAGNOSIS — M6389 Disorders of muscle in diseases classified elsewhere, multiple sites: Secondary | ICD-10-CM | POA: Diagnosis not present

## 2016-12-24 DIAGNOSIS — M48061 Spinal stenosis, lumbar region without neurogenic claudication: Secondary | ICD-10-CM | POA: Diagnosis not present

## 2016-12-24 DIAGNOSIS — Z9181 History of falling: Secondary | ICD-10-CM | POA: Diagnosis not present

## 2016-12-24 DIAGNOSIS — M21372 Foot drop, left foot: Secondary | ICD-10-CM | POA: Diagnosis not present

## 2016-12-24 DIAGNOSIS — F0390 Unspecified dementia without behavioral disturbance: Secondary | ICD-10-CM | POA: Diagnosis not present

## 2016-12-24 DIAGNOSIS — R278 Other lack of coordination: Secondary | ICD-10-CM | POA: Diagnosis not present

## 2016-12-24 DIAGNOSIS — G609 Hereditary and idiopathic neuropathy, unspecified: Secondary | ICD-10-CM | POA: Diagnosis not present

## 2016-12-24 DIAGNOSIS — R488 Other symbolic dysfunctions: Secondary | ICD-10-CM | POA: Diagnosis not present

## 2016-12-24 DIAGNOSIS — R2689 Other abnormalities of gait and mobility: Secondary | ICD-10-CM | POA: Diagnosis not present

## 2016-12-24 DIAGNOSIS — N390 Urinary tract infection, site not specified: Secondary | ICD-10-CM | POA: Diagnosis not present

## 2016-12-24 DIAGNOSIS — R1312 Dysphagia, oropharyngeal phase: Secondary | ICD-10-CM | POA: Diagnosis not present

## 2016-12-24 DIAGNOSIS — E876 Hypokalemia: Secondary | ICD-10-CM | POA: Diagnosis not present

## 2016-12-25 ENCOUNTER — Encounter: Payer: Self-pay | Admitting: Internal Medicine

## 2016-12-25 ENCOUNTER — Non-Acute Institutional Stay: Payer: Medicare Other | Admitting: Internal Medicine

## 2016-12-25 VITALS — BP 130/70 | HR 75 | Temp 98.5°F | Wt 98.0 lb

## 2016-12-25 DIAGNOSIS — R1312 Dysphagia, oropharyngeal phase: Secondary | ICD-10-CM | POA: Diagnosis not present

## 2016-12-25 DIAGNOSIS — F05 Delirium due to known physiological condition: Secondary | ICD-10-CM

## 2016-12-25 DIAGNOSIS — K5909 Other constipation: Secondary | ICD-10-CM | POA: Diagnosis not present

## 2016-12-25 DIAGNOSIS — R2689 Other abnormalities of gait and mobility: Secondary | ICD-10-CM | POA: Diagnosis not present

## 2016-12-25 DIAGNOSIS — M25551 Pain in right hip: Secondary | ICD-10-CM | POA: Diagnosis not present

## 2016-12-25 DIAGNOSIS — R488 Other symbolic dysfunctions: Secondary | ICD-10-CM | POA: Diagnosis not present

## 2016-12-25 DIAGNOSIS — N390 Urinary tract infection, site not specified: Secondary | ICD-10-CM | POA: Diagnosis not present

## 2016-12-25 DIAGNOSIS — R296 Repeated falls: Secondary | ICD-10-CM

## 2016-12-25 DIAGNOSIS — M6389 Disorders of muscle in diseases classified elsewhere, multiple sites: Secondary | ICD-10-CM | POA: Diagnosis not present

## 2016-12-25 DIAGNOSIS — E876 Hypokalemia: Secondary | ICD-10-CM | POA: Diagnosis not present

## 2016-12-25 DIAGNOSIS — R278 Other lack of coordination: Secondary | ICD-10-CM | POA: Diagnosis not present

## 2016-12-25 DIAGNOSIS — R636 Underweight: Secondary | ICD-10-CM

## 2016-12-25 NOTE — Progress Notes (Signed)
Location:  [facility] Provider:  Terriana Barreras L. Mariea Clonts, D.O., C.M.D.  Code Status: DNR Goals of Care:  Advanced Directives 12/25/2016  Does Patient Have a Medical Advance Directive? Yes  Type of Advance Directive Out of facility DNR (pink MOST or yellow form);[place];Living will  Does patient want to make changes to medical advance directive? -  Copy of [place] in Chart? Yes  Pre-existing out of facility DNR order (yellow form or pink MOST form) Yellow form placed in chart (order not valid for inpatient use);Pink MOST form placed in chart (order not valid for inpatient use)   Chief Complaint  Patient presents with  . Medical Management of Chronic Issues    has had two falls since moving [place], much more unsteady recently    HPI: Patient is a 83 y.o. female seen today for medical management of chronic diseases.    Two falls since moving to [place].  Says her left cheek injury is not as bad as it looks. She turned her heel and sat down and struck her left cheek (glasses hit face)--happened last week.  Had another fall but w/o any injury when she turned her body.  In am, when she gets up, it's time for breakfast or to get her catheterization.  Her sons would like her to have more help.    She thinks she had a fever for quite a while before her urine culture returned and she got started on cipro 250 mg po bid for 7 days on 7/5.  She reports she was having bad dreams, was very uncomfortable and was trying to use the restroom hourly overnight--only a little came out each time.  She reports she does feel much better now.  Says she is moving slower.  Last two nights, she only got up once to urinate at 3am.  Then up at 6/6:30am.    She is still trying to get settled.  Her son is going to pay bills for her now.    Speech is much less clear than it was a few months ago before her rehab stay.  Says she is able to write better than she can talk.    She tries to use  her walker to get up to urinate at night.  Already has a couple of hours in the am to dress, do I/O cath, and then 2 hrs in evening for similar.      Right hip and groin area still bothersome to her.  Walking less than she was.  Tries to walk twice a day.  She feels better if she does walk.    7/9 her MOCA was 13/30 in the midst of her UTI.  Oddly, we had no MMSEs on file to compare.    Past Medical History:  Diagnosis Date  . Anemia, unspecified   . Asymptomatic varicose veins   . Carotid bruit    hx of  . Carotid bruit    doppler normal in the past  . Diverticulosis   . Dyslipidemia   . Esophageal reflux   . Female bladder prolapse, acquired 07/17/2010  . Fibroid   . Full incontinence of feces   . Hemorrhoid    1963 surgical excision  . History of colon cancer    Adenocarcinoma of right colon, stage T2, N1.s/p resection/chemotherapy 2002  . HTN (hypertension)   . Hx of colonic polyps    1965 surgical excision  . Hypothyroidism   . Idiopathic osteoporosis   .  Idiopathic osteoporosis   . Internal hemorrhoids without mention of complication    bleed easily  . Irritable bowel syndrome   . Melanoma of skin, site unspecified   . MVP (mitral valve prolapse)   . OA (osteoarthritis)    left hip  . Osteoarthrosis, unspecified whether generalized or localized, pelvic region and thigh   . Peripheral neuropathy    hands/ feet  . 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 Spinal stenosis, unspecified region other than cervical   . Vaginal bleeding, abnormal   . Vaginitis, atrophic   . Varicose veins     Past Surgical History:  Procedure Laterality Date  . COLONOSCOPY W/ POLYPECTOMY  2006   3 mm polyp destroyed, diverticulosis  . DILATION AND CURETTAGE OF UTERUS    . ESOPHAGOGASTRODUODENOSCOPY  2009   mild gastritis  . HEMICOLECTOMY  2002   right  . [place]  . HYSTEROSCOPY    . MELANOMA EXCISION      Allergies  Allergen Reactions  . Amoxil [Amoxicillin]  Diarrhea    Has patient had a PCN reaction causing immediate rash, facial/tongue/throat swelling, SOB or lightheadedness with hypotension: no Has patient had a PCN reaction causing severe rash involving mucus membranes or skin necrosis: no Has patient had a PCN reaction that required hospitalization : no Has patient had a PCN reaction occurring within the last 10 years: no If all of the above answers are "NO", then may proceed with Cephalosporin use.   Otho Darner Allergy]     sensitivity  . Demerol     unknown  . Meperidine Hcl Other (See Comments)    Drop in blood pressure  . Infed [Iron Dextran] Rash    Became very red over face, scalp, and arms  . Neosporin [Neomycin-Bacitracin Zn-Polymyx] Rash    unknown    Allergies as of 12/25/2016      Reactions   Amoxil [amoxicillin] Diarrhea   Has patient had a PCN reaction causing immediate rash, facial/tongue/throat swelling, SOB or lightheadedness with hypotension: no Has patient had a PCN reaction causing severe rash involving mucus membranes or skin necrosis: no Has patient had a PCN reaction that required hospitalization : no Has patient had a PCN reaction occurring within the last 10 years: no If all of the above answers are "NO", then may proceed with Cephalosporin use.   Crab [shellfish Allergy]    sensitivity   Demerol    unknown   Meperidine Hcl Other (See Comments)   Drop in blood pressure   Infed [iron Dextran] Rash   Became very red over face, scalp, and arms   Neosporin [neomycin-bacitracin Zn-polymyx] Rash   unknown      Medication List       Accurate as of 12/25/16 11:43 AM. Always use your most recent med list.          acetaminophen 650 MG CR tablet Commonly known as:  TYLENOL Take 650 mg by mouth 2 (two) times daily.   ALIGN 4 MG Caps Take 1 tablet by mouth daily.   ALPRAZolam 0.25 MG tablet Commonly known as:  XANAX TAKE 1 TABLET AT BEDTIME AS NEEDED FOR ANXIETY.   antiseptic oral rinse  Liqd 15 mLs by Mouth Rinse route 3 (three) times daily.   artificial tears ointment Place 2 drops into the left eye 2 (two) times daily.   cholecalciferol 1000 units tablet Commonly known as:  VITAMIN D Take 1 tablet (1,000 Units total) by mouth daily.  CITRUCEL FIBERSHAKE oral powder Generic drug:  methylcellulose Take 1 packet by mouth daily.   ELLURA PO Take 1 capsule by mouth daily.   hydrocortisone 2.5 % rectal cream Commonly known as:  ANUSOL-HC Place 1 application rectally daily as needed for hemorrhoids or itching.   levothyroxine 75 MCG tablet Commonly known as:  SYNTHROID, LEVOTHROID TAKE 1 TABLET DAILY BEFORE BREAKFAST FOR THYROID.   methenamine 1 g tablet Commonly known as:  HIPREX Take 1 g by mouth at bedtime.   phenazopyridine 100 MG tablet Commonly known as:  PYRIDIUM Take 100 mg by mouth 3 (three) times daily as needed for pain.   Potassium Chloride ER 20 MEQ Tbcr Take 20 mEq by mouth 2 (two) times daily.   ranitidine 150 MG tablet Commonly known as:  ZANTAC Take 150 mg by mouth 2 (two) times daily as needed for heartburn.   simethicone 125 MG chewable tablet Commonly known as:  MYLICON Chew 315 mg by mouth 3 (three) times daily as needed for flatulence.   SYSTANE 0.4-0.3 % Soln Generic drug:  Polyethyl Glycol-Propyl Glycol Place 2 drops into the right eye 2 (two) times daily.   vitamin C 1000 MG tablet Take 2,000 mg by mouth daily.       Review of Systems:  Review of Systems  Constitutional: Negative for chills, fever and malaise/fatigue.  HENT: Positive for hearing loss. Negative for congestion.   Eyes: Positive for blurred vision.  Respiratory: Negative for cough and shortness of breath.   Cardiovascular: Positive for leg swelling. Negative for chest pain and palpitations.       Wears hose  Gastrointestinal: Negative for abdominal pain, blood in stool, constipation, diarrhea and melena.       Hemorrhoids, no complaints of constipation  for the first time in years today  Genitourinary: Positive for frequency and urgency. Negative for dysuria, flank pain and hematuria.       Urinary retention  Musculoskeletal: Positive for falls and joint pain.       Right hip pain/groin pain  Neurological: Positive for weakness. Negative for dizziness and loss of consciousness.       Foot drop  Endo/Heme/Allergies: Bruises/bleeds easily.  Psychiatric/Behavioral: Positive for memory loss. Negative for depression. The patient is nervous/anxious. The patient does not have insomnia.     Health Maintenance  Topic Date Due  . INFLUENZA VACCINE  01/15/2017  . TETANUS/TDAP  10/08/2021  . DEXA SCAN  Completed  . PNA vac Low Risk Adult  Completed    Physical Exam: Vitals:   12/25/16 1127  BP: 130/70  Pulse: 75  Temp: 98.5 F (36.9 C)  TempSrc: Oral  SpO2: 94%  Weight: 98 lb (44.5 kg)   Body mass index is 21.97 kg/m. Physical Exam  Constitutional: She is oriented to person, place, and time. No distress.  Frail white female ambulates with walker, has brace for foot drop  HENT:  HOH  Eyes: Pupils are equal, round, and reactive to light. EOM are normal.  Poor vision, wears glasses  Cardiovascular: Normal rate, regular rhythm, normal heart sounds and intact distal pulses.   Pulmonary/Chest: Effort normal and breath sounds normal. No respiratory distress.  Abdominal: Soft. Bowel sounds are normal. She exhibits no distension. There is no tenderness.  Neurological: She is alert and oriented to person, place, and time.  Some short term memory loss, word finding difficulty, dysarthria, gets anxious when talking and then has difficulty putting together her thoughts  Skin: Skin is warm and dry.  Skin tear and ecchymoses left cheek over zygomatic arch to temple area from glasses striking face  Psychiatric:  Anxious and jittery worse than before    Labs reviewed: Basic Metabolic Panel:  Recent Labs  10/04/16 10/29/16 1220  NA 136*  136  K 3.1* 3.1*  CL  --  99*  CO2  --  28  GLUCOSE  --  95  BUN 12 13  CREATININE 0.5 0.58  CALCIUM  --  8.7*   Liver Function Tests:  Recent Labs  10/04/16 10/29/16 1220  AST 31 39  ALT 16 18  ALKPHOS 94 79  BILITOT  --  0.8  PROT  --  6.7  ALBUMIN  --  3.3*   No results for input(s): LIPASE, AMYLASE in the last 8760 hours. No results for input(s): AMMONIA in the last 8760 hours. CBC:  Recent Labs  10/04/16 10/29/16 1220  WBC 5.8 4.4  NEUTROABS  --  2.9  HGB 11.3* 11.0*  HCT 33* 33.4*  MCV  --  91.3  PLT 215 215   Lipid Panel: No results for input(s): CHOL, HDL, LDLCALC, TRIG, CHOLHDL, LDLDIRECT in the last 8760 hours. Lab Results  Component Value Date   HGBA1C 5.9 12/02/2006    Assessment/Plan 1. Falls frequently -recent fall with facial skin tear and ecchymoses -cont use of walker, has had PT, OT in rehab and [place] and adjusting to new environment -multifactorial--foot drop, difficulty when she turns likely a vestibular issue also considering her poor hearing and sight, neuropathy, right hip OA and generalized frailty  2. Underweight -ongoing, does eat well and is back to eating, follows a healthy diet and has never been overweight  3. Recurrent UTI -urine is being checked too often for chronic symptoms due to atonic bladder and urinary retention -she was on chronic hiprex through urology--we should be leaving her mgt of her bladder control issues to them  -hiprex is on hold while she finished cipro that was started 7/5 by NP for UTI but to be restarted when completes cipro -also on ellura cranberry, prn pyridium  4. Delirium due to multiple etiologies -improving, but MMSE was done while delirious so not necessarily accurate -does have some cognitive impairment not helped by some dysarthria, anxiety (on benzo and has been for years as she does have true GAD) and sensory impairments -[place] to get med mgt and support with ADL routine, I/O caths  bid which must be following proper sterile technique -cont ST for her dysarthria  5. Right hip pain -ongoing, cont tylenol scheduled bid for pain, does not tolerate stronger meds well and they will worsen her confusion -has had PT for this also and has exercises   6. Chronic constipation with overflow incontinence -apparently doing much better as she actually did not bring this up as she usually does -cont simethicone tid prn, align probiotic, citrucel daily, plus her high fiber diet and hydration  Recommended Shingrix series to prevent shingles--unclear what cost will be to resident (part D med)  Labs/tests ordered:  No new Next appt:  12/31/2016  Yaris Ferrell L. Stephaney Steven, D.O. [facility] [address], [place] [address] Cell Phone (Mon-Fri 8am-5pm):  [phone] On Call:  [phone] & follow prompts after 5pm & weekends Office Phone:  [phone] Office Fax:  [phone]

## 2016-12-26 ENCOUNTER — Encounter (INDEPENDENT_AMBULATORY_CARE_PROVIDER_SITE_OTHER): Payer: Self-pay | Admitting: Ophthalmology

## 2016-12-26 DIAGNOSIS — R488 Other symbolic dysfunctions: Secondary | ICD-10-CM | POA: Diagnosis not present

## 2016-12-26 DIAGNOSIS — R278 Other lack of coordination: Secondary | ICD-10-CM | POA: Diagnosis not present

## 2016-12-26 DIAGNOSIS — N390 Urinary tract infection, site not specified: Secondary | ICD-10-CM | POA: Diagnosis not present

## 2016-12-26 DIAGNOSIS — D649 Anemia, unspecified: Secondary | ICD-10-CM | POA: Diagnosis not present

## 2016-12-26 DIAGNOSIS — E876 Hypokalemia: Secondary | ICD-10-CM | POA: Diagnosis not present

## 2016-12-26 DIAGNOSIS — F05 Delirium due to known physiological condition: Secondary | ICD-10-CM | POA: Diagnosis not present

## 2016-12-26 DIAGNOSIS — R1312 Dysphagia, oropharyngeal phase: Secondary | ICD-10-CM | POA: Diagnosis not present

## 2016-12-26 DIAGNOSIS — R2689 Other abnormalities of gait and mobility: Secondary | ICD-10-CM | POA: Diagnosis not present

## 2016-12-26 DIAGNOSIS — M6389 Disorders of muscle in diseases classified elsewhere, multiple sites: Secondary | ICD-10-CM | POA: Diagnosis not present

## 2016-12-26 LAB — BASIC METABOLIC PANEL
BUN: 15 (ref 4–21)
Creatinine: 0.6 (ref 0.5–1.1)
Glucose: 165
Potassium: 4.1 (ref 3.4–5.3)
Sodium: 131 — AB (ref 137–147)

## 2016-12-26 LAB — HEPATIC FUNCTION PANEL
ALT: 14 (ref 7–35)
AST: 29 (ref 13–35)
Alkaline Phosphatase: 96 (ref 25–125)
Bilirubin, Total: 0.5

## 2016-12-26 LAB — CBC AND DIFFERENTIAL
HCT: 37 (ref 36–46)
Hemoglobin: 12.6 (ref 12.0–16.0)
Platelets: 304 (ref 150–399)
WBC: 6.1

## 2016-12-27 DIAGNOSIS — M6389 Disorders of muscle in diseases classified elsewhere, multiple sites: Secondary | ICD-10-CM | POA: Diagnosis not present

## 2016-12-27 DIAGNOSIS — R278 Other lack of coordination: Secondary | ICD-10-CM | POA: Diagnosis not present

## 2016-12-27 DIAGNOSIS — R2689 Other abnormalities of gait and mobility: Secondary | ICD-10-CM | POA: Diagnosis not present

## 2016-12-27 DIAGNOSIS — R1312 Dysphagia, oropharyngeal phase: Secondary | ICD-10-CM | POA: Diagnosis not present

## 2016-12-27 DIAGNOSIS — E876 Hypokalemia: Secondary | ICD-10-CM | POA: Diagnosis not present

## 2016-12-27 DIAGNOSIS — R488 Other symbolic dysfunctions: Secondary | ICD-10-CM | POA: Diagnosis not present

## 2016-12-30 DIAGNOSIS — R2689 Other abnormalities of gait and mobility: Secondary | ICD-10-CM | POA: Diagnosis not present

## 2016-12-30 DIAGNOSIS — R278 Other lack of coordination: Secondary | ICD-10-CM | POA: Diagnosis not present

## 2016-12-30 DIAGNOSIS — M6389 Disorders of muscle in diseases classified elsewhere, multiple sites: Secondary | ICD-10-CM | POA: Diagnosis not present

## 2016-12-30 DIAGNOSIS — R488 Other symbolic dysfunctions: Secondary | ICD-10-CM | POA: Diagnosis not present

## 2016-12-30 DIAGNOSIS — R1312 Dysphagia, oropharyngeal phase: Secondary | ICD-10-CM | POA: Diagnosis not present

## 2016-12-30 DIAGNOSIS — E876 Hypokalemia: Secondary | ICD-10-CM | POA: Diagnosis not present

## 2016-12-31 ENCOUNTER — Encounter: Payer: Self-pay | Admitting: Internal Medicine

## 2016-12-31 ENCOUNTER — Non-Acute Institutional Stay: Payer: Medicare Other

## 2016-12-31 DIAGNOSIS — R1312 Dysphagia, oropharyngeal phase: Secondary | ICD-10-CM | POA: Diagnosis not present

## 2016-12-31 DIAGNOSIS — R278 Other lack of coordination: Secondary | ICD-10-CM | POA: Diagnosis not present

## 2016-12-31 DIAGNOSIS — Z0000 Encounter for general adult medical examination without abnormal findings: Secondary | ICD-10-CM | POA: Diagnosis not present

## 2016-12-31 DIAGNOSIS — R2689 Other abnormalities of gait and mobility: Secondary | ICD-10-CM | POA: Diagnosis not present

## 2016-12-31 DIAGNOSIS — R488 Other symbolic dysfunctions: Secondary | ICD-10-CM | POA: Diagnosis not present

## 2016-12-31 DIAGNOSIS — E876 Hypokalemia: Secondary | ICD-10-CM | POA: Diagnosis not present

## 2016-12-31 DIAGNOSIS — M6389 Disorders of muscle in diseases classified elsewhere, multiple sites: Secondary | ICD-10-CM | POA: Diagnosis not present

## 2016-12-31 NOTE — Progress Notes (Signed)
Subjective:   [name] Small is a 83 y.o. female who presents for an Initial Medicare Annual Wellness Visit at [facility]        Objective:    Today's Vitals   12/31/16 1423  BP: 112/60  Pulse: 76  Temp: 98.1 F (36.7 C)  TempSrc: Oral  SpO2: 90%  Weight: 98 lb (44.5 kg)  Height: 4\' 8"  (1.422 m)   Body mass index is 21.97 kg/m.   Current Medications (verified) Outpatient Encounter Prescriptions as of 12/31/2016  Medication Sig  . acetaminophen (TYLENOL) 650 MG CR tablet Take 650 mg by mouth 2 (two) times daily.  Marland Kitchen ALPRAZolam (XANAX) 0.25 MG tablet TAKE 1 TABLET AT BEDTIME AS NEEDED FOR ANXIETY.  Marland Kitchen antiseptic oral rinse (BIOTENE) LIQD 15 mLs by Mouth Rinse route 3 (three) times daily.  . Artificial Tear Ointment (ARTIFICIAL TEARS) ointment Place 2 drops into the left eye 2 (two) times daily.  . Ascorbic Acid (VITAMIN C) 1000 MG tablet Take 2,000 mg by mouth daily.  . cholecalciferol (VITAMIN D) 1000 UNITS tablet Take 1 tablet (1,000 Units total) by mouth daily.  . Cranberry (ELLURA PO) Take 1 capsule by mouth daily.  . hydrocortisone (ANUSOL-HC) 2.5 % rectal cream Place 1 application rectally daily as needed for hemorrhoids or itching.   . levothyroxine (SYNTHROID, LEVOTHROID) 75 MCG tablet TAKE 1 TABLET DAILY BEFORE BREAKFAST FOR THYROID.  Marland Kitchen methenamine (HIPREX) 1 g tablet Take 1 g by mouth at bedtime.  . methylcellulose (CITRUCEL FIBERSHAKE) oral powder Take 1 packet by mouth daily.  . phenazopyridine (PYRIDIUM) 100 MG tablet Take 100 mg by mouth 3 (three) times daily as needed for pain.  Vladimir Faster Glycol-Propyl Glycol (SYSTANE) 0.4-0.3 % SOLN Place 2 drops into the right eye 2 (two) times daily.  . Potassium Chloride ER 20 MEQ TBCR Take 20 mEq by mouth 2 (two) times daily.  . Probiotic Product (ALIGN) 4 MG CAPS Take 1 tablet by mouth daily.    . ranitidine (ZANTAC) 150 MG tablet Take 150 mg by mouth 2 (two) times daily as needed for heartburn.   .  simethicone (MYLICON) 419 MG chewable tablet Chew 125 mg by mouth 3 (three) times daily as needed for flatulence.   No facility-administered encounter medications on file as of 12/31/2016.     Allergies (verified) Amoxil [amoxicillin]; Crab [shellfish allergy]; Demerol; Meperidine hcl; Infed [iron dextran]; and Neosporin [neomycin-bacitracin zn-polymyx]   History: Past Medical History:  Diagnosis Date  . Anemia, unspecified   . Asymptomatic varicose veins   . Carotid bruit    hx of  . Carotid bruit    doppler normal in the past  . Diverticulosis   . Dyslipidemia   . Esophageal reflux   . Female bladder prolapse, acquired 07/17/2010  . Fibroid   . Full incontinence of feces   . Hemorrhoid    1963 surgical excision  . History of colon cancer    Adenocarcinoma of right colon, stage T2, N1.s/p resection/chemotherapy 2002  . HTN (hypertension)   . Hx of colonic polyps    1965 surgical excision  . Hypothyroidism   . Idiopathic osteoporosis   . Idiopathic osteoporosis   . Internal hemorrhoids without mention of complication    bleed easily  . Irritable bowel syndrome   . Melanoma of skin, site unspecified   . MVP (mitral valve prolapse)   . OA (osteoarthritis)    left hip  . Osteoarthrosis, unspecified whether generalized or localized, pelvic region  and thigh   . Peripheral neuropathy    hands/ feet  . 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 Spinal stenosis, unspecified region other than cervical   . Vaginal bleeding, abnormal   . Vaginitis, atrophic   . Varicose veins    Past Surgical History:  Procedure Laterality Date  . COLONOSCOPY W/ POLYPECTOMY  2006   3 mm polyp destroyed, diverticulosis  . DILATION AND CURETTAGE OF UTERUS    . ESOPHAGOGASTRODUODENOSCOPY  2009   mild gastritis  . HEMICOLECTOMY  2002   right  . [place]  . HYSTEROSCOPY    . MELANOMA EXCISION     Family History  Problem Relation Age of Onset  . Heart disease Father   .  Diabetes Father   . Heart disease Sister   . Stomach cancer Maternal Grandfather   . Colon cancer Neg Hx    Social History   Occupational History  . retired Retired   Social History Main Topics  . Smoking status: Former Smoker    Years: 2.00    Types: Cigarettes    Quit date: 09/09/1943  . Smokeless tobacco: Never Used  . Alcohol use 0.0 oz/week     Comment: occas wine 2 glasses a week  . Drug use: No  . Sexual activity: No    Tobacco Counseling Counseling given: Not Answered   Activities of Daily Living In your present state of health, do you have any difficulty performing the following activities: 12/31/2016  Hearing? Y  Vision? Y  Difficulty concentrating or making decisions? Y  Walking or climbing stairs? Y  Dressing or bathing? Y  Doing errands, shopping? Y  Preparing Food and eating ? Y  Using the Toilet? Y  In the past six months, have you accidently leaked urine? Y  Do you have problems with loss of bowel control? Y  Managing your Medications? Y  Managing your Finances? Y  Housekeeping or managing your Housekeeping? Y  Some recent data might be hidden    Immunizations and Health Maintenance Immunization History  Administered Date(s) Administered  . H1N1 05/30/2008  . Influenza Whole 03/17/2000, 03/05/2010, 03/17/2012  . Influenza,inj,Quad PF,36+ Mos 03/14/2015  . Influenza-Unspecified 04/16/2013, 03/31/2014, 04/11/2016  . Pneumococcal Conjugate-13 09/08/2015  . Pneumococcal Polysaccharide-23 03/17/1997  . Tdap 10/09/2011   There are no preventive care reminders to display for this patient.  Patient Care Team: Gayland Curry, DO as PCP - General (Geriatric Medicine) Terrance Mass, MD as Consulting Physician (Gynecology) Shon Hough, MD as Consulting Physician (Ophthalmology) Carolan Clines, MD as Consulting Physician (Urology) Gatha Mayer, MD as Consulting Physician (Gastroenterology) Wyatt Portela, MD as Consulting Physician  (Oncology)  Indicate any recent Medical Services you may have received from other than [facility] providers in the past year (date may be approximate).     Assessment:   This is a routine wellness examination for [name] Small.   Hearing/Vision screen No exam data present  Dietary issues and exercise activities discussed: Current Exercise Habits: The patient does not participate in regular exercise at present, Exercise limited by: orthopedic condition(s)  Goals    None     Depression Screen PHQ 2/9 Scores 12/31/2016 12/25/2016 10/10/2016 08/28/2016 05/15/2016 02/14/2016 02/01/2016  PHQ - 2 Score 0 0 0 0 0 0 0    Fall Risk Fall Risk  12/31/2016 12/25/2016 10/10/2016 08/28/2016 05/15/2016  Falls in the past year? Yes Yes Yes Yes No  Number falls in past yr: 2 or more 2 or  more 1 1 -  Injury with Fall? Yes Yes - No -    Cognitive Function: MMSE - Mini Mental State Exam 10/30/2016  Orientation to time 4  Orientation to Place 5  Registration 3  Attention/ Calculation 2  Recall 3  Language- name 2 objects 2  Language- repeat 1  Language- follow 3 step command 3  Language- read & follow direction 1  Write a sentence 1  Copy design 1  Total score 26        Screening Tests Health Maintenance  Topic Date Due  . INFLUENZA VACCINE  01/15/2017  . TETANUS/TDAP  10/08/2021  . DEXA SCAN  Completed  . PNA vac Low Risk Adult  Completed      Plan:    I have personally reviewed and addressed the Medicare Annual Wellness questionnaire and have noted the following in the patient's chart:  A. Medical and social history B. Use of alcohol, tobacco or illicit drugs  C. Current medications and supplements D. Functional ability and status E.  Nutritional status F.  Physical activity G. Advance directives H. List of other physicians I.  Hospitalizations, surgeries, and ER visits in previous 12 months J.  [place] to include hearing, vision, cognitive, depression L. Referrals and  appointments - none  In addition, I have reviewed and discussed with patient certain preventive protocols, quality metrics, and best practice recommendations. A written personalized care plan for preventive services as well as general preventive health recommendations were provided to patient.  See attached scanned questionnaire for additional information.   Signed,   Rich Reining, RN Nurse Health Advisor   Quick Notes   Health Maintenance: Up to date     Abnormal Screen: MMSE 26/30 on 10/30/16. Did not pass clock drawing     Patient Concerns: None     Nurse Concerns: None

## 2017-01-01 DIAGNOSIS — M6389 Disorders of muscle in diseases classified elsewhere, multiple sites: Secondary | ICD-10-CM | POA: Diagnosis not present

## 2017-01-01 DIAGNOSIS — R1312 Dysphagia, oropharyngeal phase: Secondary | ICD-10-CM | POA: Diagnosis not present

## 2017-01-01 DIAGNOSIS — R488 Other symbolic dysfunctions: Secondary | ICD-10-CM | POA: Diagnosis not present

## 2017-01-01 DIAGNOSIS — R278 Other lack of coordination: Secondary | ICD-10-CM | POA: Diagnosis not present

## 2017-01-01 DIAGNOSIS — R2689 Other abnormalities of gait and mobility: Secondary | ICD-10-CM | POA: Diagnosis not present

## 2017-01-01 DIAGNOSIS — E876 Hypokalemia: Secondary | ICD-10-CM | POA: Diagnosis not present

## 2017-01-02 DIAGNOSIS — R488 Other symbolic dysfunctions: Secondary | ICD-10-CM | POA: Diagnosis not present

## 2017-01-02 DIAGNOSIS — R1312 Dysphagia, oropharyngeal phase: Secondary | ICD-10-CM | POA: Diagnosis not present

## 2017-01-02 DIAGNOSIS — E876 Hypokalemia: Secondary | ICD-10-CM | POA: Diagnosis not present

## 2017-01-02 DIAGNOSIS — M6389 Disorders of muscle in diseases classified elsewhere, multiple sites: Secondary | ICD-10-CM | POA: Diagnosis not present

## 2017-01-02 DIAGNOSIS — R2689 Other abnormalities of gait and mobility: Secondary | ICD-10-CM | POA: Diagnosis not present

## 2017-01-02 DIAGNOSIS — R278 Other lack of coordination: Secondary | ICD-10-CM | POA: Diagnosis not present

## 2017-01-03 DIAGNOSIS — R2689 Other abnormalities of gait and mobility: Secondary | ICD-10-CM | POA: Diagnosis not present

## 2017-01-03 DIAGNOSIS — E876 Hypokalemia: Secondary | ICD-10-CM | POA: Diagnosis not present

## 2017-01-03 DIAGNOSIS — R1312 Dysphagia, oropharyngeal phase: Secondary | ICD-10-CM | POA: Diagnosis not present

## 2017-01-03 DIAGNOSIS — R278 Other lack of coordination: Secondary | ICD-10-CM | POA: Diagnosis not present

## 2017-01-03 DIAGNOSIS — R488 Other symbolic dysfunctions: Secondary | ICD-10-CM | POA: Diagnosis not present

## 2017-01-03 DIAGNOSIS — M6389 Disorders of muscle in diseases classified elsewhere, multiple sites: Secondary | ICD-10-CM | POA: Diagnosis not present

## 2017-01-06 ENCOUNTER — Encounter (INDEPENDENT_AMBULATORY_CARE_PROVIDER_SITE_OTHER): Payer: Medicare Other | Admitting: Ophthalmology

## 2017-01-06 DIAGNOSIS — M6389 Disorders of muscle in diseases classified elsewhere, multiple sites: Secondary | ICD-10-CM | POA: Diagnosis not present

## 2017-01-06 DIAGNOSIS — H353122 Nonexudative age-related macular degeneration, left eye, intermediate dry stage: Secondary | ICD-10-CM | POA: Diagnosis not present

## 2017-01-06 DIAGNOSIS — H353211 Exudative age-related macular degeneration, right eye, with active choroidal neovascularization: Secondary | ICD-10-CM | POA: Diagnosis not present

## 2017-01-06 DIAGNOSIS — H43813 Vitreous degeneration, bilateral: Secondary | ICD-10-CM | POA: Diagnosis not present

## 2017-01-06 DIAGNOSIS — R488 Other symbolic dysfunctions: Secondary | ICD-10-CM | POA: Diagnosis not present

## 2017-01-06 DIAGNOSIS — R1312 Dysphagia, oropharyngeal phase: Secondary | ICD-10-CM | POA: Diagnosis not present

## 2017-01-06 DIAGNOSIS — D3132 Benign neoplasm of left choroid: Secondary | ICD-10-CM

## 2017-01-06 DIAGNOSIS — R278 Other lack of coordination: Secondary | ICD-10-CM | POA: Diagnosis not present

## 2017-01-06 DIAGNOSIS — R2689 Other abnormalities of gait and mobility: Secondary | ICD-10-CM | POA: Diagnosis not present

## 2017-01-06 DIAGNOSIS — E876 Hypokalemia: Secondary | ICD-10-CM | POA: Diagnosis not present

## 2017-01-07 DIAGNOSIS — R488 Other symbolic dysfunctions: Secondary | ICD-10-CM | POA: Diagnosis not present

## 2017-01-07 DIAGNOSIS — E876 Hypokalemia: Secondary | ICD-10-CM | POA: Diagnosis not present

## 2017-01-07 DIAGNOSIS — M6389 Disorders of muscle in diseases classified elsewhere, multiple sites: Secondary | ICD-10-CM | POA: Diagnosis not present

## 2017-01-07 DIAGNOSIS — R278 Other lack of coordination: Secondary | ICD-10-CM | POA: Diagnosis not present

## 2017-01-07 DIAGNOSIS — R2689 Other abnormalities of gait and mobility: Secondary | ICD-10-CM | POA: Diagnosis not present

## 2017-01-07 DIAGNOSIS — R1312 Dysphagia, oropharyngeal phase: Secondary | ICD-10-CM | POA: Diagnosis not present

## 2017-01-08 DIAGNOSIS — R1312 Dysphagia, oropharyngeal phase: Secondary | ICD-10-CM | POA: Diagnosis not present

## 2017-01-08 DIAGNOSIS — R488 Other symbolic dysfunctions: Secondary | ICD-10-CM | POA: Diagnosis not present

## 2017-01-08 DIAGNOSIS — R2689 Other abnormalities of gait and mobility: Secondary | ICD-10-CM | POA: Diagnosis not present

## 2017-01-08 DIAGNOSIS — E876 Hypokalemia: Secondary | ICD-10-CM | POA: Diagnosis not present

## 2017-01-08 DIAGNOSIS — Z79899 Other long term (current) drug therapy: Secondary | ICD-10-CM | POA: Diagnosis not present

## 2017-01-08 DIAGNOSIS — R278 Other lack of coordination: Secondary | ICD-10-CM | POA: Diagnosis not present

## 2017-01-08 DIAGNOSIS — M6389 Disorders of muscle in diseases classified elsewhere, multiple sites: Secondary | ICD-10-CM | POA: Diagnosis not present

## 2017-01-08 DIAGNOSIS — R319 Hematuria, unspecified: Secondary | ICD-10-CM | POA: Diagnosis not present

## 2017-01-08 DIAGNOSIS — N390 Urinary tract infection, site not specified: Secondary | ICD-10-CM | POA: Diagnosis not present

## 2017-01-09 DIAGNOSIS — E876 Hypokalemia: Secondary | ICD-10-CM | POA: Diagnosis not present

## 2017-01-09 DIAGNOSIS — M6389 Disorders of muscle in diseases classified elsewhere, multiple sites: Secondary | ICD-10-CM | POA: Diagnosis not present

## 2017-01-09 DIAGNOSIS — R2689 Other abnormalities of gait and mobility: Secondary | ICD-10-CM | POA: Diagnosis not present

## 2017-01-09 DIAGNOSIS — R1312 Dysphagia, oropharyngeal phase: Secondary | ICD-10-CM | POA: Diagnosis not present

## 2017-01-09 DIAGNOSIS — R488 Other symbolic dysfunctions: Secondary | ICD-10-CM | POA: Diagnosis not present

## 2017-01-09 DIAGNOSIS — R278 Other lack of coordination: Secondary | ICD-10-CM | POA: Diagnosis not present

## 2017-01-10 DIAGNOSIS — R2689 Other abnormalities of gait and mobility: Secondary | ICD-10-CM | POA: Diagnosis not present

## 2017-01-10 DIAGNOSIS — R278 Other lack of coordination: Secondary | ICD-10-CM | POA: Diagnosis not present

## 2017-01-10 DIAGNOSIS — E876 Hypokalemia: Secondary | ICD-10-CM | POA: Diagnosis not present

## 2017-01-10 DIAGNOSIS — M6389 Disorders of muscle in diseases classified elsewhere, multiple sites: Secondary | ICD-10-CM | POA: Diagnosis not present

## 2017-01-10 DIAGNOSIS — R488 Other symbolic dysfunctions: Secondary | ICD-10-CM | POA: Diagnosis not present

## 2017-01-10 DIAGNOSIS — R1312 Dysphagia, oropharyngeal phase: Secondary | ICD-10-CM | POA: Diagnosis not present

## 2017-01-13 DIAGNOSIS — H6123 Impacted cerumen, bilateral: Secondary | ICD-10-CM | POA: Diagnosis not present

## 2017-01-13 DIAGNOSIS — R1312 Dysphagia, oropharyngeal phase: Secondary | ICD-10-CM | POA: Diagnosis not present

## 2017-01-13 DIAGNOSIS — R488 Other symbolic dysfunctions: Secondary | ICD-10-CM | POA: Diagnosis not present

## 2017-01-13 DIAGNOSIS — E876 Hypokalemia: Secondary | ICD-10-CM | POA: Diagnosis not present

## 2017-01-13 DIAGNOSIS — R2689 Other abnormalities of gait and mobility: Secondary | ICD-10-CM | POA: Diagnosis not present

## 2017-01-13 DIAGNOSIS — H6093 Unspecified otitis externa, bilateral: Secondary | ICD-10-CM | POA: Diagnosis not present

## 2017-01-13 DIAGNOSIS — R278 Other lack of coordination: Secondary | ICD-10-CM | POA: Diagnosis not present

## 2017-01-13 DIAGNOSIS — H903 Sensorineural hearing loss, bilateral: Secondary | ICD-10-CM | POA: Diagnosis not present

## 2017-01-13 DIAGNOSIS — M6389 Disorders of muscle in diseases classified elsewhere, multiple sites: Secondary | ICD-10-CM | POA: Diagnosis not present

## 2017-01-14 DIAGNOSIS — R278 Other lack of coordination: Secondary | ICD-10-CM | POA: Diagnosis not present

## 2017-01-14 DIAGNOSIS — R1312 Dysphagia, oropharyngeal phase: Secondary | ICD-10-CM | POA: Diagnosis not present

## 2017-01-14 DIAGNOSIS — M6389 Disorders of muscle in diseases classified elsewhere, multiple sites: Secondary | ICD-10-CM | POA: Diagnosis not present

## 2017-01-14 DIAGNOSIS — R488 Other symbolic dysfunctions: Secondary | ICD-10-CM | POA: Diagnosis not present

## 2017-01-14 DIAGNOSIS — E876 Hypokalemia: Secondary | ICD-10-CM | POA: Diagnosis not present

## 2017-01-14 DIAGNOSIS — R2689 Other abnormalities of gait and mobility: Secondary | ICD-10-CM | POA: Diagnosis not present

## 2017-01-15 DIAGNOSIS — F0390 Unspecified dementia without behavioral disturbance: Secondary | ICD-10-CM | POA: Diagnosis not present

## 2017-01-15 DIAGNOSIS — R2689 Other abnormalities of gait and mobility: Secondary | ICD-10-CM | POA: Diagnosis not present

## 2017-01-15 DIAGNOSIS — M21372 Foot drop, left foot: Secondary | ICD-10-CM | POA: Diagnosis not present

## 2017-01-15 DIAGNOSIS — R488 Other symbolic dysfunctions: Secondary | ICD-10-CM | POA: Diagnosis not present

## 2017-01-15 DIAGNOSIS — M48061 Spinal stenosis, lumbar region without neurogenic claudication: Secondary | ICD-10-CM | POA: Diagnosis not present

## 2017-01-15 DIAGNOSIS — M6389 Disorders of muscle in diseases classified elsewhere, multiple sites: Secondary | ICD-10-CM | POA: Diagnosis not present

## 2017-01-15 DIAGNOSIS — E876 Hypokalemia: Secondary | ICD-10-CM | POA: Diagnosis not present

## 2017-01-15 DIAGNOSIS — R278 Other lack of coordination: Secondary | ICD-10-CM | POA: Diagnosis not present

## 2017-01-15 DIAGNOSIS — Z9181 History of falling: Secondary | ICD-10-CM | POA: Diagnosis not present

## 2017-01-15 DIAGNOSIS — G609 Hereditary and idiopathic neuropathy, unspecified: Secondary | ICD-10-CM | POA: Diagnosis not present

## 2017-01-15 DIAGNOSIS — N390 Urinary tract infection, site not specified: Secondary | ICD-10-CM | POA: Diagnosis not present

## 2017-01-15 DIAGNOSIS — R1312 Dysphagia, oropharyngeal phase: Secondary | ICD-10-CM | POA: Diagnosis not present

## 2017-01-16 DIAGNOSIS — R1312 Dysphagia, oropharyngeal phase: Secondary | ICD-10-CM | POA: Diagnosis not present

## 2017-01-16 DIAGNOSIS — M6389 Disorders of muscle in diseases classified elsewhere, multiple sites: Secondary | ICD-10-CM | POA: Diagnosis not present

## 2017-01-16 DIAGNOSIS — E876 Hypokalemia: Secondary | ICD-10-CM | POA: Diagnosis not present

## 2017-01-16 DIAGNOSIS — R278 Other lack of coordination: Secondary | ICD-10-CM | POA: Diagnosis not present

## 2017-01-16 DIAGNOSIS — R2689 Other abnormalities of gait and mobility: Secondary | ICD-10-CM | POA: Diagnosis not present

## 2017-01-16 DIAGNOSIS — R488 Other symbolic dysfunctions: Secondary | ICD-10-CM | POA: Diagnosis not present

## 2017-01-17 DIAGNOSIS — E876 Hypokalemia: Secondary | ICD-10-CM | POA: Diagnosis not present

## 2017-01-17 DIAGNOSIS — M6389 Disorders of muscle in diseases classified elsewhere, multiple sites: Secondary | ICD-10-CM | POA: Diagnosis not present

## 2017-01-17 DIAGNOSIS — R2689 Other abnormalities of gait and mobility: Secondary | ICD-10-CM | POA: Diagnosis not present

## 2017-01-17 DIAGNOSIS — R278 Other lack of coordination: Secondary | ICD-10-CM | POA: Diagnosis not present

## 2017-01-17 DIAGNOSIS — R488 Other symbolic dysfunctions: Secondary | ICD-10-CM | POA: Diagnosis not present

## 2017-01-17 DIAGNOSIS — R1312 Dysphagia, oropharyngeal phase: Secondary | ICD-10-CM | POA: Diagnosis not present

## 2017-01-20 DIAGNOSIS — R488 Other symbolic dysfunctions: Secondary | ICD-10-CM | POA: Diagnosis not present

## 2017-01-20 DIAGNOSIS — R1312 Dysphagia, oropharyngeal phase: Secondary | ICD-10-CM | POA: Diagnosis not present

## 2017-01-20 DIAGNOSIS — R2689 Other abnormalities of gait and mobility: Secondary | ICD-10-CM | POA: Diagnosis not present

## 2017-01-20 DIAGNOSIS — E876 Hypokalemia: Secondary | ICD-10-CM | POA: Diagnosis not present

## 2017-01-20 DIAGNOSIS — R278 Other lack of coordination: Secondary | ICD-10-CM | POA: Diagnosis not present

## 2017-01-20 DIAGNOSIS — M6389 Disorders of muscle in diseases classified elsewhere, multiple sites: Secondary | ICD-10-CM | POA: Diagnosis not present

## 2017-01-21 DIAGNOSIS — E876 Hypokalemia: Secondary | ICD-10-CM | POA: Diagnosis not present

## 2017-01-21 DIAGNOSIS — M6389 Disorders of muscle in diseases classified elsewhere, multiple sites: Secondary | ICD-10-CM | POA: Diagnosis not present

## 2017-01-21 DIAGNOSIS — R2689 Other abnormalities of gait and mobility: Secondary | ICD-10-CM | POA: Diagnosis not present

## 2017-01-21 DIAGNOSIS — R488 Other symbolic dysfunctions: Secondary | ICD-10-CM | POA: Diagnosis not present

## 2017-01-21 DIAGNOSIS — R278 Other lack of coordination: Secondary | ICD-10-CM | POA: Diagnosis not present

## 2017-01-21 DIAGNOSIS — R1312 Dysphagia, oropharyngeal phase: Secondary | ICD-10-CM | POA: Diagnosis not present

## 2017-01-22 DIAGNOSIS — R2689 Other abnormalities of gait and mobility: Secondary | ICD-10-CM | POA: Diagnosis not present

## 2017-01-22 DIAGNOSIS — R488 Other symbolic dysfunctions: Secondary | ICD-10-CM | POA: Diagnosis not present

## 2017-01-22 DIAGNOSIS — E876 Hypokalemia: Secondary | ICD-10-CM | POA: Diagnosis not present

## 2017-01-22 DIAGNOSIS — R1312 Dysphagia, oropharyngeal phase: Secondary | ICD-10-CM | POA: Diagnosis not present

## 2017-01-22 DIAGNOSIS — M6389 Disorders of muscle in diseases classified elsewhere, multiple sites: Secondary | ICD-10-CM | POA: Diagnosis not present

## 2017-01-22 DIAGNOSIS — R278 Other lack of coordination: Secondary | ICD-10-CM | POA: Diagnosis not present

## 2017-01-23 DIAGNOSIS — R488 Other symbolic dysfunctions: Secondary | ICD-10-CM | POA: Diagnosis not present

## 2017-01-23 DIAGNOSIS — E876 Hypokalemia: Secondary | ICD-10-CM | POA: Diagnosis not present

## 2017-01-23 DIAGNOSIS — H6093 Unspecified otitis externa, bilateral: Secondary | ICD-10-CM | POA: Diagnosis not present

## 2017-01-23 DIAGNOSIS — R278 Other lack of coordination: Secondary | ICD-10-CM | POA: Diagnosis not present

## 2017-01-23 DIAGNOSIS — R2689 Other abnormalities of gait and mobility: Secondary | ICD-10-CM | POA: Diagnosis not present

## 2017-01-23 DIAGNOSIS — H6123 Impacted cerumen, bilateral: Secondary | ICD-10-CM | POA: Diagnosis not present

## 2017-01-23 DIAGNOSIS — M6389 Disorders of muscle in diseases classified elsewhere, multiple sites: Secondary | ICD-10-CM | POA: Diagnosis not present

## 2017-01-23 DIAGNOSIS — R1312 Dysphagia, oropharyngeal phase: Secondary | ICD-10-CM | POA: Diagnosis not present

## 2017-01-23 DIAGNOSIS — H903 Sensorineural hearing loss, bilateral: Secondary | ICD-10-CM | POA: Diagnosis not present

## 2017-01-24 DIAGNOSIS — R2689 Other abnormalities of gait and mobility: Secondary | ICD-10-CM | POA: Diagnosis not present

## 2017-01-24 DIAGNOSIS — E876 Hypokalemia: Secondary | ICD-10-CM | POA: Diagnosis not present

## 2017-01-24 DIAGNOSIS — R488 Other symbolic dysfunctions: Secondary | ICD-10-CM | POA: Diagnosis not present

## 2017-01-24 DIAGNOSIS — M6389 Disorders of muscle in diseases classified elsewhere, multiple sites: Secondary | ICD-10-CM | POA: Diagnosis not present

## 2017-01-24 DIAGNOSIS — R1312 Dysphagia, oropharyngeal phase: Secondary | ICD-10-CM | POA: Diagnosis not present

## 2017-01-24 DIAGNOSIS — R278 Other lack of coordination: Secondary | ICD-10-CM | POA: Diagnosis not present

## 2017-01-27 DIAGNOSIS — M6389 Disorders of muscle in diseases classified elsewhere, multiple sites: Secondary | ICD-10-CM | POA: Diagnosis not present

## 2017-01-27 DIAGNOSIS — R488 Other symbolic dysfunctions: Secondary | ICD-10-CM | POA: Diagnosis not present

## 2017-01-27 DIAGNOSIS — R278 Other lack of coordination: Secondary | ICD-10-CM | POA: Diagnosis not present

## 2017-01-27 DIAGNOSIS — E876 Hypokalemia: Secondary | ICD-10-CM | POA: Diagnosis not present

## 2017-01-27 DIAGNOSIS — R1312 Dysphagia, oropharyngeal phase: Secondary | ICD-10-CM | POA: Diagnosis not present

## 2017-01-27 DIAGNOSIS — R2689 Other abnormalities of gait and mobility: Secondary | ICD-10-CM | POA: Diagnosis not present

## 2017-01-28 DIAGNOSIS — M6389 Disorders of muscle in diseases classified elsewhere, multiple sites: Secondary | ICD-10-CM | POA: Diagnosis not present

## 2017-01-28 DIAGNOSIS — R488 Other symbolic dysfunctions: Secondary | ICD-10-CM | POA: Diagnosis not present

## 2017-01-28 DIAGNOSIS — R278 Other lack of coordination: Secondary | ICD-10-CM | POA: Diagnosis not present

## 2017-01-28 DIAGNOSIS — R1312 Dysphagia, oropharyngeal phase: Secondary | ICD-10-CM | POA: Diagnosis not present

## 2017-01-28 DIAGNOSIS — R2689 Other abnormalities of gait and mobility: Secondary | ICD-10-CM | POA: Diagnosis not present

## 2017-01-28 DIAGNOSIS — E876 Hypokalemia: Secondary | ICD-10-CM | POA: Diagnosis not present

## 2017-01-29 ENCOUNTER — Encounter: Payer: Self-pay | Admitting: Internal Medicine

## 2017-01-29 ENCOUNTER — Non-Acute Institutional Stay: Payer: Medicare Other | Admitting: Internal Medicine

## 2017-01-29 VITALS — BP 120/62 | HR 86 | Temp 97.8°F | Wt 100.0 lb

## 2017-01-29 DIAGNOSIS — R339 Retention of urine, unspecified: Secondary | ICD-10-CM

## 2017-01-29 DIAGNOSIS — R400 Somnolence: Secondary | ICD-10-CM | POA: Diagnosis not present

## 2017-01-29 DIAGNOSIS — K5909 Other constipation: Secondary | ICD-10-CM | POA: Diagnosis not present

## 2017-01-29 DIAGNOSIS — R488 Other symbolic dysfunctions: Secondary | ICD-10-CM | POA: Diagnosis not present

## 2017-01-29 DIAGNOSIS — F0390 Unspecified dementia without behavioral disturbance: Secondary | ICD-10-CM

## 2017-01-29 DIAGNOSIS — H903 Sensorineural hearing loss, bilateral: Secondary | ICD-10-CM | POA: Diagnosis not present

## 2017-01-29 DIAGNOSIS — E039 Hypothyroidism, unspecified: Secondary | ICD-10-CM | POA: Diagnosis not present

## 2017-01-29 DIAGNOSIS — E876 Hypokalemia: Secondary | ICD-10-CM | POA: Diagnosis not present

## 2017-01-29 DIAGNOSIS — R278 Other lack of coordination: Secondary | ICD-10-CM | POA: Diagnosis not present

## 2017-01-29 DIAGNOSIS — R2689 Other abnormalities of gait and mobility: Secondary | ICD-10-CM | POA: Diagnosis not present

## 2017-01-29 DIAGNOSIS — R1312 Dysphagia, oropharyngeal phase: Secondary | ICD-10-CM | POA: Diagnosis not present

## 2017-01-29 DIAGNOSIS — M6389 Disorders of muscle in diseases classified elsewhere, multiple sites: Secondary | ICD-10-CM | POA: Diagnosis not present

## 2017-01-29 NOTE — Progress Notes (Signed)
Location:  [facility] of Service:  Clinic (12)  Provider: Dani Danis L. Mariea Clonts, D.O., C.M.D.  Code Status: DNR Goals of Care:  Advanced Directives 01/29/2017  Does Patient Have a Medical Advance Directive? Yes  Type of Advance Directive Out of facility DNR (pink MOST or yellow form);[place];Living will  Does patient want to make changes to medical advance directive? -  Copy of [place] in Chart? Yes  Pre-existing out of facility DNR order (yellow form or pink MOST form) Yellow form placed in chart (order not valid for inpatient use);Pink MOST form placed in chart (order not valid for inpatient use)   Chief Complaint  Patient presents with  . Acute Visit    falling asleep frequently    HPI: Patient is a 83 y.o. female seen today for an acute visit for falling asleep too much.    Pt did not know why she was here.  She lives in [place] since her rehab stay when she had a UTI and had [place] falling [place].  [place] fell last 01[place]14/17 [place] area.  Urinates once all day.  Then goes at 10pm, then 2-3 am, going 2-4x total.  It aches all over her abdomen when she goes.  Right leg aches.  Has some hip arthritis.      She was never a good sleeper.  Used to go to bed at 11pm and not sleep any other time.  She fell asleep during the care plan meeting.  Also will fall sleep when sitting at the computer.   Her only medication that would make her sleepy is bedtime xanax as needed.    ST working with her about memory and speech.   Mouth very dry.  Speech not clear.     PT working with her on her left drop foot.  Tightening the brace has helped.    Hearing continues to get worse.  Seems she is not hearing me at all today.    Past Medical History:  Diagnosis Date  . Anemia, unspecified   . Asymptomatic varicose veins   . Carotid bruit    hx of  . Carotid bruit    doppler normal in the past  . Diverticulosis   .  Dyslipidemia   . Esophageal reflux   . Female bladder prolapse, acquired 07/17/2010  . Fibroid   . Full incontinence of feces   . Hemorrhoid    1963 surgical excision  . History of colon cancer    Adenocarcinoma of right colon, stage T2, N1.s/p resection/chemotherapy 2002  . HTN (hypertension)   . Hx of colonic polyps    1965 surgical excision  . Hypothyroidism   . Idiopathic osteoporosis   . Idiopathic osteoporosis   . Internal hemorrhoids without mention of complication    bleed easily  . Irritable bowel syndrome   . Melanoma of skin, site unspecified   . MVP (mitral valve prolapse)   . OA (osteoarthritis)    left hip  . Osteoarthrosis, unspecified whether generalized or localized, pelvic region and thigh   . Peripheral neuropathy    hands/ feet  . 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 Spinal stenosis, unspecified region other than cervical   . Vaginal bleeding, abnormal   . Vaginitis, atrophic   . Varicose veins     Past Surgical History:  Procedure Laterality Date  . COLONOSCOPY W/ POLYPECTOMY  2006   3 mm polyp destroyed,  diverticulosis  . DILATION AND CURETTAGE OF UTERUS    . ESOPHAGOGASTRODUODENOSCOPY  2009   mild gastritis  . HEMICOLECTOMY  2002   right  . [place]  . HYSTEROSCOPY    . MELANOMA EXCISION      Allergies  Allergen Reactions  . Amoxil [Amoxicillin] Diarrhea    Has patient had a PCN reaction causing immediate rash, facial/tongue/throat swelling, SOB or lightheadedness with hypotension: no Has patient had a PCN reaction causing severe rash involving mucus membranes or skin necrosis: no Has patient had a PCN reaction that required hospitalization : no Has patient had a PCN reaction occurring within the last 10 years: no If all of the above answers are "NO", then may proceed with Cephalosporin use.   Otho Darner Allergy]     sensitivity  . Demerol     unknown  . Meperidine Hcl Other (See Comments)    Drop in blood  pressure  . Infed [Iron Dextran] Rash    Became very red over face, scalp, and arms  . Neosporin [Neomycin-Bacitracin Zn-Polymyx] Rash    unknown    Allergies as of 01/29/2017      Reactions   Amoxil [amoxicillin] Diarrhea   Has patient had a PCN reaction causing immediate rash, facial/tongue/throat swelling, SOB or lightheadedness with hypotension: no Has patient had a PCN reaction causing severe rash involving mucus membranes or skin necrosis: no Has patient had a PCN reaction that required hospitalization : no Has patient had a PCN reaction occurring within the last 10 years: no If all of the above answers are "NO", then may proceed with Cephalosporin use.   Crab [shellfish Allergy]    sensitivity   Demerol    unknown   Meperidine Hcl Other (See Comments)   Drop in blood pressure   Infed [iron Dextran] Rash   Became very red over face, scalp, and arms   Neosporin [neomycin-bacitracin Zn-polymyx] Rash   unknown      Medication List       Accurate as of 01/29/17  2:34 PM. Always use your most recent med list.          acetaminophen 650 MG CR tablet Commonly known as:  TYLENOL Take 650 mg by mouth 2 (two) times daily.   ALIGN 4 MG Caps Take 1 tablet by mouth daily.   ALPRAZolam 0.25 MG tablet Commonly known as:  XANAX TAKE 1 TABLET AT BEDTIME AS NEEDED FOR ANXIETY.   antiseptic oral rinse Liqd 15 mLs by Mouth Rinse route 3 (three) times daily.   artificial tears ointment Place 2 drops into the left eye 2 (two) times daily.   cholecalciferol 1000 units tablet Commonly known as:  VITAMIN D Take 1 tablet (1,000 Units total) by mouth daily.   CITRUCEL FIBERSHAKE oral powder Generic drug:  methylcellulose Take 1 packet by mouth every other day.   ELLURA PO Take 1 capsule by mouth daily.   hydrocortisone 2.5 % rectal cream Commonly known as:  ANUSOL-HC Place 1 application rectally daily as needed for hemorrhoids or itching.   levothyroxine 75 MCG  tablet Commonly known as:  SYNTHROID, LEVOTHROID TAKE 1 TABLET DAILY BEFORE BREAKFAST FOR THYROID.   methenamine 1 g tablet Commonly known as:  HIPREX Take 1 g by mouth at bedtime.   Potassium Chloride ER 20 MEQ Tbcr Take 20 mEq by mouth 2 (two) times daily.   ranitidine 150 MG tablet Commonly known as:  ZANTAC Take 150 mg by mouth 2 (  two) times daily as needed for heartburn.   simethicone 125 MG chewable tablet Commonly known as:  MYLICON Chew 967 mg by mouth 3 (three) times daily as needed for flatulence.   SYSTANE 0.4-0.3 % Soln Generic drug:  Polyethyl Glycol-Propyl Glycol Place 2 drops into the right eye 2 (two) times daily.   vitamin C 1000 MG tablet Take 2,000 mg by mouth daily.       Review of Systems:  Review of Systems  Constitutional: Positive for malaise/fatigue. Negative for chills and fever.  HENT: Positive for hearing loss.   Eyes: Positive for blurred vision.  Respiratory: Negative for shortness of breath.   Cardiovascular: Negative for chest pain, palpitations and leg swelling.  Gastrointestinal: Positive for constipation. Negative for abdominal pain, blood in stool, diarrhea and melena.  Genitourinary: Positive for frequency and urgency. Negative for dysuria, flank pain and hematuria.  Musculoskeletal: Positive for falls and joint pain.       Foot drop, right hip pain  Skin: Negative for itching and rash.  Neurological: Negative for dizziness, loss of consciousness and weakness.  Endo/Heme/Allergies: Bruises/bleeds easily.  Psychiatric/Behavioral: Positive for depression and memory loss. The patient is nervous/anxious and has insomnia.     Health Maintenance  Topic Date Due  . INFLUENZA VACCINE  01/15/2017  . TETANUS/TDAP  10/08/2021  . DEXA SCAN  Completed  . PNA vac Low Risk Adult  Completed    Physical Exam: Vitals:   01/29/17 1403  BP: 120/62  Pulse: 86  Temp: 97.8 F (36.6 C)  TempSrc: Oral  SpO2: 96%  Weight: 100 lb (45.4 kg)    Body mass index is 22.42 kg/m. Physical Exam  Constitutional: No distress.  Cardiovascular: Normal rate, regular rhythm, normal heart sounds and intact distal pulses.   Pulmonary/Chest: Effort normal and breath sounds normal. No respiratory distress.  Abdominal: Bowel sounds are normal.  Musculoskeletal: Normal range of motion.  Neurological:  Left foot drop with brace in place, walking with walker, speech more slurred, anxious, jittery  Skin: Skin is warm and dry. There is pallor.  Psychiatric: She has a normal mood and affect.    Labs reviewed: Basic Metabolic Panel:  Recent Labs  10/04/16 10/29/16 1220 12/26/16 0700  NA 136* 136 131*  K 3.1* 3.1* 4.1  CL  --  99*  --   CO2  --  28  --   GLUCOSE  --  95  --   BUN 12 13 15   CREATININE 0.5 0.58 0.6  CALCIUM  --  8.7*  --    Liver Function Tests:  Recent Labs  10/04/16 10/29/16 1220 12/26/16 0700  AST 31 39 29  ALT 16 18 14   ALKPHOS 94 79 96  BILITOT  --  0.8  --   PROT  --  6.7  --   ALBUMIN  --  3.3*  --    No results for input(s): LIPASE, AMYLASE in the last 8760 hours. No results for input(s): AMMONIA in the last 8760 hours. CBC:  Recent Labs  10/04/16 10/29/16 1220 12/26/16 0700  WBC 5.8 4.4 6.1  NEUTROABS  --  2.9  --   HGB 11.3* 11.0* 12.6  HCT 33* 33.4* 37  MCV  --  91.3  --   PLT 215 215 304   Lipid Panel: No results for input(s): CHOL, HDL, LDLCALC, TRIG, CHOLHDL, LDLDIRECT in the last 8760 hours. Lab Results  Component Value Date   HGBA1C 5.9 12/02/2006    Procedures since  last visit: No results found.  Assessment/Plan 1. Urinary retention -still getting bid I/O caths -need to have a means of clearly documenting each trip to the restroom--resident still goes on her own so hard to do--I'm not sure she is keeping good track on her own b/c history is hard to track (see hpi)  -I'm wondering if the timing of the caths is the best considering her frequency pattern at hs if what she  describes is accurate -follows with urology also -has a loss of bladder tone  2. Dementia without behavioral disturbance, unspecified dementia type -progressing and now lives in [place], seems she had a stroke in the midst of the last "UTI" she had b/c her speech has become much harder to understand and her history providing skills have deteriorate considerably -cont AL support and bid nursing for caths  3. Sensorineural hearing loss (SNHL) of both ears -advanced and vision very poor, too, which contribute to her dementia progression -did not have her right hearing aid on (has a new different one and cannot see the switch to operate it, but when we turned it on, it helped dramatically)  4. Hypothyroidism, unspecified type -cont current levothyroxine Lab Results  Component Value Date   TSH 1.18 04/25/2015  -due to check this  5. Daytime sleepiness -suspect due to some extent to getting up to urinate at night, but hard to track this--need more data -also due to progressing frailty at 89  6. Chronic constipation with overflow incontinence -chronic issue, cont current bowel regimen and monitor  Labs/tests ordered:  Tsh, cbc, cmp next draw Next appt:  Keep October appt, also schedule December Waneta Fitting L. Geoge Lawrance, D.O. [facility] [address], [place] [address] Cell Phone (Mon-Fri 8am-5pm):  [phone] On Call:  [phone] & follow prompts after 5pm & weekends Office Phone:  [phone] Office Fax:  [phone]

## 2017-01-30 DIAGNOSIS — R278 Other lack of coordination: Secondary | ICD-10-CM | POA: Diagnosis not present

## 2017-01-30 DIAGNOSIS — R488 Other symbolic dysfunctions: Secondary | ICD-10-CM | POA: Diagnosis not present

## 2017-01-30 DIAGNOSIS — D649 Anemia, unspecified: Secondary | ICD-10-CM | POA: Diagnosis not present

## 2017-01-30 DIAGNOSIS — R6889 Other general symptoms and signs: Secondary | ICD-10-CM | POA: Diagnosis not present

## 2017-01-30 DIAGNOSIS — R946 Abnormal results of thyroid function studies: Secondary | ICD-10-CM | POA: Diagnosis not present

## 2017-01-30 DIAGNOSIS — R7989 Other specified abnormal findings of blood chemistry: Secondary | ICD-10-CM | POA: Diagnosis not present

## 2017-01-30 DIAGNOSIS — R2689 Other abnormalities of gait and mobility: Secondary | ICD-10-CM | POA: Diagnosis not present

## 2017-01-30 DIAGNOSIS — E039 Hypothyroidism, unspecified: Secondary | ICD-10-CM | POA: Diagnosis not present

## 2017-01-30 DIAGNOSIS — R1312 Dysphagia, oropharyngeal phase: Secondary | ICD-10-CM | POA: Diagnosis not present

## 2017-01-30 DIAGNOSIS — M6389 Disorders of muscle in diseases classified elsewhere, multiple sites: Secondary | ICD-10-CM | POA: Diagnosis not present

## 2017-01-30 DIAGNOSIS — E876 Hypokalemia: Secondary | ICD-10-CM | POA: Diagnosis not present

## 2017-01-30 LAB — HEPATIC FUNCTION PANEL
ALT: 17 (ref 7–35)
AST: 30 (ref 13–35)
Alkaline Phosphatase: 125 (ref 25–125)
Bilirubin, Total: 0.5

## 2017-01-30 LAB — BASIC METABOLIC PANEL
BUN: 17 (ref 4–21)
Creatinine: 0.5 (ref 0.5–1.1)
Glucose: 100
Potassium: 4.3 (ref 3.4–5.3)
Sodium: 133 — AB (ref 137–147)

## 2017-01-30 LAB — CBC AND DIFFERENTIAL
HCT: 35 — AB (ref 36–46)
Hemoglobin: 11.8 — AB (ref 12.0–16.0)
Platelets: 283 (ref 150–399)
WBC: 7

## 2017-01-30 LAB — TSH: TSH: 2.38 (ref 0.41–5.90)

## 2017-02-03 ENCOUNTER — Encounter (INDEPENDENT_AMBULATORY_CARE_PROVIDER_SITE_OTHER): Payer: Medicare Other | Admitting: Ophthalmology

## 2017-02-03 DIAGNOSIS — H353122 Nonexudative age-related macular degeneration, left eye, intermediate dry stage: Secondary | ICD-10-CM | POA: Diagnosis not present

## 2017-02-03 DIAGNOSIS — H2513 Age-related nuclear cataract, bilateral: Secondary | ICD-10-CM

## 2017-02-03 DIAGNOSIS — H43813 Vitreous degeneration, bilateral: Secondary | ICD-10-CM | POA: Diagnosis not present

## 2017-02-03 DIAGNOSIS — H353211 Exudative age-related macular degeneration, right eye, with active choroidal neovascularization: Secondary | ICD-10-CM

## 2017-02-03 DIAGNOSIS — D3132 Benign neoplasm of left choroid: Secondary | ICD-10-CM | POA: Diagnosis not present

## 2017-02-04 ENCOUNTER — Encounter: Payer: Self-pay | Admitting: Internal Medicine

## 2017-02-05 DIAGNOSIS — R1312 Dysphagia, oropharyngeal phase: Secondary | ICD-10-CM | POA: Diagnosis not present

## 2017-02-05 DIAGNOSIS — R488 Other symbolic dysfunctions: Secondary | ICD-10-CM | POA: Diagnosis not present

## 2017-02-05 DIAGNOSIS — R278 Other lack of coordination: Secondary | ICD-10-CM | POA: Diagnosis not present

## 2017-02-05 DIAGNOSIS — M6389 Disorders of muscle in diseases classified elsewhere, multiple sites: Secondary | ICD-10-CM | POA: Diagnosis not present

## 2017-02-05 DIAGNOSIS — R2689 Other abnormalities of gait and mobility: Secondary | ICD-10-CM | POA: Diagnosis not present

## 2017-02-05 DIAGNOSIS — E876 Hypokalemia: Secondary | ICD-10-CM | POA: Diagnosis not present

## 2017-02-20 DIAGNOSIS — N312 Flaccid neuropathic bladder, not elsewhere classified: Secondary | ICD-10-CM | POA: Diagnosis not present

## 2017-02-22 DIAGNOSIS — Z79899 Other long term (current) drug therapy: Secondary | ICD-10-CM | POA: Diagnosis not present

## 2017-02-22 DIAGNOSIS — R5381 Other malaise: Secondary | ICD-10-CM | POA: Diagnosis not present

## 2017-02-22 DIAGNOSIS — N390 Urinary tract infection, site not specified: Secondary | ICD-10-CM | POA: Diagnosis not present

## 2017-02-22 DIAGNOSIS — R300 Dysuria: Secondary | ICD-10-CM | POA: Diagnosis not present

## 2017-02-22 DIAGNOSIS — R319 Hematuria, unspecified: Secondary | ICD-10-CM | POA: Diagnosis not present

## 2017-02-22 DIAGNOSIS — N3000 Acute cystitis without hematuria: Secondary | ICD-10-CM | POA: Diagnosis not present

## 2017-03-03 ENCOUNTER — Encounter (INDEPENDENT_AMBULATORY_CARE_PROVIDER_SITE_OTHER): Payer: Self-pay | Admitting: Ophthalmology

## 2017-03-03 DIAGNOSIS — Z79899 Other long term (current) drug therapy: Secondary | ICD-10-CM | POA: Diagnosis not present

## 2017-03-03 DIAGNOSIS — R300 Dysuria: Secondary | ICD-10-CM | POA: Diagnosis not present

## 2017-03-03 DIAGNOSIS — N390 Urinary tract infection, site not specified: Secondary | ICD-10-CM | POA: Diagnosis not present

## 2017-03-03 DIAGNOSIS — R4182 Altered mental status, unspecified: Secondary | ICD-10-CM | POA: Diagnosis not present

## 2017-03-03 DIAGNOSIS — R319 Hematuria, unspecified: Secondary | ICD-10-CM | POA: Diagnosis not present

## 2017-03-05 ENCOUNTER — Encounter (INDEPENDENT_AMBULATORY_CARE_PROVIDER_SITE_OTHER): Payer: Medicare Other | Admitting: Ophthalmology

## 2017-03-05 DIAGNOSIS — H353122 Nonexudative age-related macular degeneration, left eye, intermediate dry stage: Secondary | ICD-10-CM | POA: Diagnosis not present

## 2017-03-05 DIAGNOSIS — D3132 Benign neoplasm of left choroid: Secondary | ICD-10-CM

## 2017-03-05 DIAGNOSIS — H2513 Age-related nuclear cataract, bilateral: Secondary | ICD-10-CM | POA: Diagnosis not present

## 2017-03-05 DIAGNOSIS — H43813 Vitreous degeneration, bilateral: Secondary | ICD-10-CM

## 2017-03-05 DIAGNOSIS — H353211 Exudative age-related macular degeneration, right eye, with active choroidal neovascularization: Secondary | ICD-10-CM | POA: Diagnosis not present

## 2017-03-26 ENCOUNTER — Encounter: Payer: Self-pay | Admitting: Internal Medicine

## 2017-03-26 ENCOUNTER — Non-Acute Institutional Stay: Payer: Medicare Other | Admitting: Internal Medicine

## 2017-03-26 VITALS — BP 128/80 | HR 70 | Temp 98.5°F | Wt 102.0 lb

## 2017-03-26 DIAGNOSIS — N952 Postmenopausal atrophic vaginitis: Secondary | ICD-10-CM | POA: Diagnosis not present

## 2017-03-26 DIAGNOSIS — M25551 Pain in right hip: Secondary | ICD-10-CM | POA: Diagnosis not present

## 2017-03-26 DIAGNOSIS — N368 Other specified disorders of urethra: Secondary | ICD-10-CM

## 2017-03-26 DIAGNOSIS — F0390 Unspecified dementia without behavioral disturbance: Secondary | ICD-10-CM

## 2017-03-26 DIAGNOSIS — H353230 Exudative age-related macular degeneration, bilateral, stage unspecified: Secondary | ICD-10-CM

## 2017-03-26 DIAGNOSIS — H903 Sensorineural hearing loss, bilateral: Secondary | ICD-10-CM

## 2017-03-26 DIAGNOSIS — R339 Retention of urine, unspecified: Secondary | ICD-10-CM

## 2017-03-26 NOTE — Progress Notes (Signed)
Location:   [facility]   Place of Service:   clinic  Provider: Janith Nielson L. Mariea Clonts, D.O., C.M.D.  Code Status: DNR Goals of Care:  Advanced Directives 03/26/2017  Does Patient Have a Medical Advance Directive? Yes  Type of Advance Directive Out of facility DNR (pink MOST or yellow form);[place];Living will  Does patient want to make changes to medical advance directive? -  Copy of [place] in Chart? Yes  Pre-existing out of facility DNR order (yellow form or pink MOST form) Yellow form placed in chart (order not valid for inpatient use);Pink MOST form placed in chart (order not valid for inpatient use)   Chief Complaint  Patient presents with  . Medical Management of Chronic Issues    81mth follow-up    HPI: Patient is a 83 y.o. female seen today for medical management of chronic diseases.    Nursing noted her urine was thick and sludgy.  Nothing new.  No need to check urine AGAIN.  Takes medicine at 12 midnight then up at 1-2am then up 3-4 times to go.  Had been having dysuria, but now just itchy.  Had a cream that worked well.  Had been using anusol cream for her hemorrhoids, would think estrace for her vaginal area would be beneficial.  Also having fewer full incontinence episodes.  She's had no fevers that I've been made aware of either.  We discussed a catheter for her at this point due to the severity of her frequency and interference with sleep.  She also obsesses about her urination and this seems to affect her quality of life and make her more anxious.  She does not want to do this.  Remembers having one for 2 days at one point--seems it bothers her b/c she's concerned about the appearance and emptying it.  Discussed thigh bag.  Isn't bothered during the day, only at night.      She seems mentally pretty clear today.  Talks fast and jittery which is not new or different.    Inner thigh on right bothers her sometimes, but less than she did  before.  Has brace for foot drop on left leg.  Knows she is walking slower.    Shots are no longer helping her eyes.  Tires to walk 2x a day or at least once to the first floor.  She goes to the sitting exercise also.  It's now 4 days per week.    Labs from august were reviewed with her.    C/o nodding off when sitting.  Is taking xanax at hs only--this is not new.  Discussed that she does not get good sleep b/c of frequent urination.  Falls asleep at computer.  Has to use computer b/c her writing is up and down and scribbly, she reports.    Past Medical History:  Diagnosis Date  . Anemia, unspecified   . Asymptomatic varicose veins   . Carotid bruit    hx of  . Carotid bruit    doppler normal in the past  . Diverticulosis   . Dyslipidemia   . Esophageal reflux   . Female bladder prolapse, acquired 07/17/2010  . Fibroid   . Full incontinence of feces   . Hemorrhoid    1963 surgical excision  . History of colon cancer    Adenocarcinoma of right colon, stage T2, N1.s/p resection/chemotherapy 2002  . HTN (hypertension)   . Hx of colonic polyps    1965 surgical  excision  . Hypothyroidism   . Idiopathic osteoporosis   . Idiopathic osteoporosis   . Internal hemorrhoids without mention of complication    bleed easily  . Irritable bowel syndrome   . Melanoma of skin, site unspecified   . MVP (mitral valve prolapse)   . OA (osteoarthritis)    left hip  . Osteoarthrosis, unspecified whether generalized or localized, pelvic region and thigh   . Peripheral neuropathy    hands/ feet  . 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 Spinal stenosis, unspecified region other than cervical   . Vaginal bleeding, abnormal   . Vaginitis, atrophic   . Varicose veins     Past Surgical History:  Procedure Laterality Date  . COLONOSCOPY W/ POLYPECTOMY  2006   3 mm polyp destroyed, diverticulosis  . DILATION AND CURETTAGE OF UTERUS    . ESOPHAGOGASTRODUODENOSCOPY  2009   mild gastritis  .  HEMICOLECTOMY  2002   right  . [place]  . HYSTEROSCOPY    . MELANOMA EXCISION      Allergies  Allergen Reactions  . Amoxil [Amoxicillin] Diarrhea    Has patient had a PCN reaction causing immediate rash, facial/tongue/throat swelling, SOB or lightheadedness with hypotension: no Has patient had a PCN reaction causing severe rash involving mucus membranes or skin necrosis: no Has patient had a PCN reaction that required hospitalization : no Has patient had a PCN reaction occurring within the last 10 years: no If all of the above answers are "NO", then may proceed with Cephalosporin use.   Otho Darner Allergy]     sensitivity  . Demerol     unknown  . Meperidine Hcl Other (See Comments)    Drop in blood pressure  . Infed [Iron Dextran] Rash    Became very red over face, scalp, and arms  . Neosporin [Neomycin-Bacitracin Zn-Polymyx] Rash    unknown    Outpatient Encounter Prescriptions as of 03/26/2017  Medication Sig  . acetaminophen (TYLENOL) 650 MG CR tablet Take 650 mg by mouth 2 (two) times daily.  Marland Kitchen ALPRAZolam (XANAX) 0.25 MG tablet TAKE 1 TABLET AT BEDTIME AS NEEDED FOR ANXIETY.  Marland Kitchen antiseptic oral rinse (BIOTENE) LIQD 15 mLs by Mouth Rinse route 3 (three) times daily.  . Artificial Tear Ointment (ARTIFICIAL TEARS) ointment Place 2 drops into the left eye 2 (two) times daily.  . cholecalciferol (VITAMIN D) 1000 UNITS tablet Take 1 tablet (1,000 Units total) by mouth daily.  . Cranberry (ELLURA PO) Take 1 capsule by mouth daily.  . hydrocortisone (ANUSOL-HC) 2.5 % rectal cream Place 1 application rectally daily as needed for hemorrhoids or itching.   . levothyroxine (SYNTHROID, LEVOTHROID) 75 MCG tablet TAKE 1 TABLET DAILY BEFORE BREAKFAST FOR THYROID.  Marland Kitchen methenamine (HIPREX) 1 g tablet Take 1 g by mouth at bedtime.  . methylcellulose (CITRUCEL FIBERSHAKE) oral powder Take 1 packet by mouth every other day.   Vladimir Faster Glycol-Propyl Glycol (SYSTANE)  0.4-0.3 % SOLN Place 2 drops into the right eye 2 (two) times daily.  . Potassium Chloride ER 20 MEQ TBCR Take 20 mEq by mouth 2 (two) times daily.  . Probiotic Product (ALIGN) 4 MG CAPS Take 1 tablet by mouth daily.    . ranitidine (ZANTAC) 150 MG tablet Take 150 mg by mouth 2 (two) times daily as needed for heartburn.   . simethicone (MYLICON) 283 MG chewable tablet Chew 125 mg by mouth 3 (three) times daily as needed for flatulence.   No  facility-administered encounter medications on file as of 03/26/2017.     Review of Systems:  Review of Systems  Constitutional: Positive for malaise/fatigue. Negative for chills and fever.  HENT: Positive for hearing loss.        Hearing aids  Eyes: Positive for blurred vision.       Glasses  Respiratory: Negative for shortness of breath.   Cardiovascular: Negative for chest pain, palpitations and leg swelling.  Gastrointestinal: Negative for abdominal pain, blood in stool, constipation and melena.  Genitourinary: Positive for dysuria, frequency and urgency. Negative for flank pain and hematuria.  Musculoskeletal: Positive for falls.  Skin: Positive for itching. Negative for rash.       Itching of vaginal area  Neurological: Negative for dizziness and weakness.  Endo/Heme/Allergies: Bruises/bleeds easily.  Psychiatric/Behavioral: Positive for memory loss. The patient is nervous/anxious and has insomnia.     Health Maintenance  Topic Date Due  . INFLUENZA VACCINE  01/15/2017  . TETANUS/TDAP  10/08/2021  . DEXA SCAN  Completed  . PNA vac Low Risk Adult  Completed    Physical Exam: Vitals:   03/26/17 1059  BP: 128/80  Pulse: 70  Temp: 98.5 F (36.9 C)  TempSrc: Oral  SpO2: 96%  Weight: 102 lb (46.3 kg)   Body mass index is 22.87 kg/m. Physical Exam  Constitutional: She is oriented to person, place, and time. No distress.  Frail white female, walks with rollator walker very slowly  HENT:  Head: Normocephalic and atraumatic.    Cardiovascular: Normal rate, regular rhythm, normal heart sounds and intact distal pulses.   Pulmonary/Chest: Effort normal and breath sounds normal. No respiratory distress.  Abdominal: Bowel sounds are normal.  Musculoskeletal: Normal range of motion.  Left foot drop with brace, tenderness of right groin  Neurological: She is alert and oriented to person, place, and time.  Poor historian at times  Skin: Skin is warm and dry. There is pallor.  Psychiatric:  Anxious and jittery, talks fast--unchanged    Labs reviewed: Basic Metabolic Panel:  Recent Labs  10/29/16 1220 12/26/16 0700 01/30/17 0700  NA 136 131* 133*  K 3.1* 4.1 4.3  CL 99*  --   --   CO2 28  --   --   GLUCOSE 95  --   --   BUN 13 15 17   CREATININE 0.58 0.6 0.5  CALCIUM 8.7*  --   --   TSH  --   --  2.38   Liver Function Tests:  Recent Labs  10/29/16 1220 12/26/16 0700 01/30/17 0700  AST 39 29 30  ALT 18 14 17   ALKPHOS 79 96 125  BILITOT 0.8  --   --   PROT 6.7  --   --   ALBUMIN 3.3*  --   --    No results for input(s): LIPASE, AMYLASE in the last 8760 hours. No results for input(s): AMMONIA in the last 8760 hours. CBC:  Recent Labs  10/29/16 1220 12/26/16 0700 01/30/17 0700  WBC 4.4 6.1 7.0  NEUTROABS 2.9  --   --   HGB 11.0* 12.6 11.8*  HCT 33.4* 37 35*  MCV 91.3  --   --   PLT 215 304 283   Lipid Panel: No results for input(s): CHOL, HDL, LDLCALC, TRIG, CHOLHDL, LDLDIRECT in the last 8760 hours. Lab Results  Component Value Date   HGBA1C 5.9 12/02/2006    Assessment/Plan 1. Urinary retention -ongoing, has difficulty with frequency at night and gets up  a lot despite three times per day I/O caths -discussed foley catheter use, but pt refuses at this time due to difficulties with leg bag and her skirts and just thinks it won't work for her for some reason -unfortunately, does not get good sleep and falls asleep during computer work   2. Atrophic vaginitis -estrace might help,  but pt says she had a cream from urology before that worked best--was not the replens that gyn prescribed and she does not think it's hydrocortisone (but then says it's a different strength of what is used for her bowels which was anusol/hydrocortisone)--is mostly having itching now  3. Prolapsed urethral mucosa -likely also why she has discomfort and itching, see#2  4. Right hip pain -ongoing, but not as bad lately, still walking for exercise and attending classes, has tylenol scheduled bid which is likely why it's better  5. Dementia without behavioral disturbance, unspecified dementia type -gradually progressing, also worsened by anxiety and poor sleep  6. Sensorineural hearing loss (SNHL) of both ears -severe despite hearing aids  7. Bilateral exudative age-related macular degeneration, unspecified stage (HCC) -severe, no longer responds to shots, continues on drops per ophtho  Labs/tests ordered: no new Next appt:  F/u 4 mos  Krishauna Schatzman L. Janalynn Eder, D.O. [facility] [address], [place] [address] Cell Phone (Mon-Fri 8am-5pm):  [phone] On Call:  [phone] & follow prompts after 5pm & weekends Office Phone:  [phone] Office Fax:  [phone]

## 2017-03-31 ENCOUNTER — Encounter (INDEPENDENT_AMBULATORY_CARE_PROVIDER_SITE_OTHER): Payer: Medicare Other | Admitting: Ophthalmology

## 2017-03-31 DIAGNOSIS — H353122 Nonexudative age-related macular degeneration, left eye, intermediate dry stage: Secondary | ICD-10-CM | POA: Diagnosis not present

## 2017-03-31 DIAGNOSIS — H43813 Vitreous degeneration, bilateral: Secondary | ICD-10-CM

## 2017-03-31 DIAGNOSIS — D3132 Benign neoplasm of left choroid: Secondary | ICD-10-CM

## 2017-03-31 DIAGNOSIS — H2513 Age-related nuclear cataract, bilateral: Secondary | ICD-10-CM

## 2017-03-31 DIAGNOSIS — H353211 Exudative age-related macular degeneration, right eye, with active choroidal neovascularization: Secondary | ICD-10-CM | POA: Diagnosis not present

## 2017-04-10 DIAGNOSIS — Z23 Encounter for immunization: Secondary | ICD-10-CM | POA: Diagnosis not present

## 2017-04-17 DIAGNOSIS — H353211 Exudative age-related macular degeneration, right eye, with active choroidal neovascularization: Secondary | ICD-10-CM | POA: Diagnosis not present

## 2017-04-17 DIAGNOSIS — H02102 Unspecified ectropion of right lower eyelid: Secondary | ICD-10-CM | POA: Diagnosis not present

## 2017-04-17 DIAGNOSIS — H52203 Unspecified astigmatism, bilateral: Secondary | ICD-10-CM | POA: Diagnosis not present

## 2017-04-17 DIAGNOSIS — H25813 Combined forms of age-related cataract, bilateral: Secondary | ICD-10-CM | POA: Diagnosis not present

## 2017-05-15 ENCOUNTER — Encounter (INDEPENDENT_AMBULATORY_CARE_PROVIDER_SITE_OTHER): Payer: Medicare Other | Admitting: Ophthalmology

## 2017-05-15 DIAGNOSIS — I10 Essential (primary) hypertension: Secondary | ICD-10-CM | POA: Diagnosis not present

## 2017-05-15 DIAGNOSIS — H353122 Nonexudative age-related macular degeneration, left eye, intermediate dry stage: Secondary | ICD-10-CM | POA: Diagnosis not present

## 2017-05-15 DIAGNOSIS — H35033 Hypertensive retinopathy, bilateral: Secondary | ICD-10-CM | POA: Diagnosis not present

## 2017-05-15 DIAGNOSIS — H353211 Exudative age-related macular degeneration, right eye, with active choroidal neovascularization: Secondary | ICD-10-CM

## 2017-05-15 DIAGNOSIS — H43813 Vitreous degeneration, bilateral: Secondary | ICD-10-CM | POA: Diagnosis not present

## 2017-05-15 DIAGNOSIS — D3132 Benign neoplasm of left choroid: Secondary | ICD-10-CM | POA: Diagnosis not present

## 2017-05-22 DIAGNOSIS — N312 Flaccid neuropathic bladder, not elsewhere classified: Secondary | ICD-10-CM | POA: Diagnosis not present

## 2017-05-22 DIAGNOSIS — N3020 Other chronic cystitis without hematuria: Secondary | ICD-10-CM | POA: Diagnosis not present

## 2017-06-04 ENCOUNTER — Encounter: Payer: Self-pay | Admitting: Internal Medicine

## 2017-06-16 DIAGNOSIS — N390 Urinary tract infection, site not specified: Secondary | ICD-10-CM | POA: Diagnosis not present

## 2017-06-16 DIAGNOSIS — N3000 Acute cystitis without hematuria: Secondary | ICD-10-CM | POA: Diagnosis not present

## 2017-06-16 DIAGNOSIS — Z79899 Other long term (current) drug therapy: Secondary | ICD-10-CM | POA: Diagnosis not present

## 2017-06-16 DIAGNOSIS — T83511A Infection and inflammatory reaction due to indwelling urethral catheter, initial encounter: Secondary | ICD-10-CM | POA: Diagnosis not present

## 2017-07-03 ENCOUNTER — Encounter (INDEPENDENT_AMBULATORY_CARE_PROVIDER_SITE_OTHER): Payer: Medicare Other | Admitting: Ophthalmology

## 2017-07-03 DIAGNOSIS — H35033 Hypertensive retinopathy, bilateral: Secondary | ICD-10-CM

## 2017-07-03 DIAGNOSIS — H43813 Vitreous degeneration, bilateral: Secondary | ICD-10-CM | POA: Diagnosis not present

## 2017-07-03 DIAGNOSIS — H353122 Nonexudative age-related macular degeneration, left eye, intermediate dry stage: Secondary | ICD-10-CM

## 2017-07-03 DIAGNOSIS — I10 Essential (primary) hypertension: Secondary | ICD-10-CM | POA: Diagnosis not present

## 2017-07-03 DIAGNOSIS — H353211 Exudative age-related macular degeneration, right eye, with active choroidal neovascularization: Secondary | ICD-10-CM

## 2017-07-03 DIAGNOSIS — H2513 Age-related nuclear cataract, bilateral: Secondary | ICD-10-CM | POA: Diagnosis not present

## 2017-07-17 ENCOUNTER — Non-Acute Institutional Stay: Payer: Medicare Other | Admitting: Adult Health

## 2017-07-17 ENCOUNTER — Encounter: Payer: Self-pay | Admitting: Adult Health

## 2017-07-17 DIAGNOSIS — R05 Cough: Secondary | ICD-10-CM | POA: Diagnosis not present

## 2017-07-17 DIAGNOSIS — R059 Cough, unspecified: Secondary | ICD-10-CM

## 2017-07-17 NOTE — Progress Notes (Signed)
Location:  [facility] Room Number: 381 Place of Service:  ALF [phone]) Provider:  Algis Greenhouse, RN, NP-Student/ Royal Hawthorn, ANP  Gayland Curry, DO  Patient Care Team: Gayland Curry, DO as PCP - General (Geriatric Medicine) Terrance Mass, MD as Consulting Physician (Gynecology) Shon Hough, MD as Consulting Physician (Ophthalmology) Carolan Clines, MD as Consulting Physician (Urology) Gatha Mayer, MD as Consulting Physician (Gastroenterology) Wyatt Portela, MD as Consulting Physician (Oncology)  Extended Emergency Contact Information Primary Emergency Contact: Fox,Richard (Dr) Address: [address]          [place], [place] Phone: [phone] Work Phone: [phone] Relation: Alanson Puls Secondary Emergency Contact: Claudell Kyle Address: [place] [address] Johnnette Litter of [place] Mobile Phone: [phone] Relation: Son  Code Status:  DNR Goals of care: Advanced Directive information Advanced Directives 03/26/2017  Does Patient Have a Medical Advance Directive? Yes  Type of Advance Directive Out of facility DNR (pink MOST or yellow form);[place];Living will  Does patient want to make changes to medical advance directive? -  Copy of Icard in Chart? Yes  Pre-existing out of facility DNR order (yellow form or pink MOST form) Yellow form placed in chart (order not valid for inpatient use);Pink MOST form placed in chart (order not valid for inpatient use)     Chief Complaint  Patient presents with  . Cough    HPI:  Pt is a 84 y.o. female seen today for an acute visit for cough for 4 days. She describes the cough as intermittent, dry, and "hard". The cough is occasionally productive with white mucous. She cannot identify any aggravating or relieving factors. Denies sick contacts. She has taken Robitussin as  needed with minimal symptom relief. The cough has prevented her from going to a few meals according to RN staff.    Past Medical History:  Diagnosis Date  . Anemia, unspecified   . Asymptomatic varicose veins   . Carotid bruit    hx of  . Carotid bruit    doppler normal in the past  . Diverticulosis   . Dyslipidemia   . Esophageal reflux   . Female bladder prolapse, acquired 07/17/2010  . Fibroid   . Full incontinence of feces   . Hemorrhoid    1963 surgical excision  . History of colon cancer    Adenocarcinoma of right colon, stage T2, N1.s/p resection/chemotherapy 2002  . HTN (hypertension)   . Hx of colonic polyps    1965 surgical excision  . Hypothyroidism   . Idiopathic osteoporosis   . Idiopathic osteoporosis   . Internal hemorrhoids without mention of complication    bleed easily  . Irritable bowel syndrome   . Melanoma of skin, site unspecified   . MVP (mitral valve prolapse)   . OA (osteoarthritis)    left hip  . Osteoarthrosis, unspecified whether generalized or localized, pelvic region and thigh   . Peripheral neuropathy    hands/ feet  . 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 Spinal stenosis, unspecified region other than cervical   . Vaginal bleeding, abnormal   . Vaginitis, atrophic   . Varicose veins    Past Surgical History:  Procedure Laterality Date  . COLONOSCOPY W/ POLYPECTOMY  2006   3 mm polyp destroyed, diverticulosis  . DILATION AND CURETTAGE OF UTERUS    . ESOPHAGOGASTRODUODENOSCOPY  2009   mild gastritis  . HEMICOLECTOMY  2002   right  . [place]  . HYSTEROSCOPY    . MELANOMA EXCISION      Allergies  Allergen Reactions  . Amoxil [Amoxicillin] Diarrhea    Has patient had a PCN reaction causing immediate rash, facial/tongue/throat swelling, SOB or lightheadedness with hypotension: no Has patient had a PCN reaction causing severe rash involving mucus membranes or skin necrosis: no Has patient had a PCN reaction that  required hospitalization : no Has patient had a PCN reaction occurring within the last 10 years: no If all of the above answers are "NO", then may proceed with Cephalosporin use.   Otho Darner Allergy]     sensitivity  . Demerol     unknown  . Meperidine Hcl Other (See Comments)    Drop in blood pressure  . Infed [Iron Dextran] Rash    Became very red over face, scalp, and arms  . Neosporin [Neomycin-Bacitracin Zn-Polymyx] Rash    unknown    Outpatient Encounter Medications as of 07/17/2017  Medication Sig  . acetaminophen (TYLENOL) 650 MG CR tablet Take 650 mg by mouth 2 (two) times daily.  Marland Kitchen antiseptic oral rinse (BIOTENE) LIQD 15 mLs by Mouth Rinse route 3 (three) times daily.  . Artificial Tear Ointment (ARTIFICIAL TEARS) ointment Place 2 drops into the left eye 2 (two) times daily.  . cholecalciferol (VITAMIN D) 1000 UNITS tablet Take 1 tablet (1,000 Units total) by mouth daily.  . Cranberry (ELLURA PO) Take 1 capsule by mouth daily.  . hydrocortisone (ANUSOL-HC) 2.5 % rectal cream Place 1 application rectally daily as needed for hemorrhoids or itching.   . levothyroxine (SYNTHROID, LEVOTHROID) 75 MCG tablet TAKE 1 TABLET DAILY BEFORE BREAKFAST FOR THYROID.  Marland Kitchen methenamine (HIPREX) 1 g tablet Take 1 g by mouth at bedtime.  . methylcellulose (CITRUCEL FIBERSHAKE) oral powder Take 1 packet by mouth every other day.   . phenazopyridine (PYRIDIUM) 200 MG tablet Take 200 mg by mouth 3 (three) times daily as needed for pain.  Vladimir Faster Glycol-Propyl Glycol (SYSTANE) 0.4-0.3 % SOLN Place 2 drops into the right eye 2 (two) times daily.  . Potassium Chloride ER 20 MEQ TBCR Take 20 mEq by mouth 2 (two) times daily.  . Probiotic Product (ALIGN) 4 MG CAPS Take 1 tablet by mouth daily.    . vitamin C (ASCORBIC ACID) 500 MG tablet Take 500 mg by mouth daily.  Marland Kitchen ALPRAZolam (XANAX) 0.25 MG tablet TAKE 1 TABLET AT BEDTIME AS NEEDED FOR ANXIETY. (Patient not taking: Reported on 07/17/2017)    . ranitidine (ZANTAC) 150 MG tablet Take 150 mg by mouth 2 (two) times daily as needed for heartburn.   . simethicone (MYLICON) 782 MG chewable tablet Chew 125 mg by mouth 3 (three) times daily as needed for flatulence.   No facility-administered encounter medications on file as of 07/17/2017.     Review of Systems  Constitutional: Positive for activity change and appetite change. Negative for chills, fever and unexpected weight change.       Not attending meals per RN staff   HENT: Positive for hearing loss. Negative for congestion, ear pain, postnasal drip, sinus pressure, sinus pain, sore throat and trouble swallowing.   Eyes: Negative.   Respiratory: Positive for cough. Negative for shortness of breath and wheezing.   Cardiovascular: Negative.   Skin: Negative.   Psychiatric/Behavioral: Negative.     Immunization History  Administered Date(s) Administered  .  H1N1 05/30/2008  . Influenza Whole 03/17/2000, 03/05/2010, 03/17/2012  . Influenza,inj,Quad PF,6+ Mos 03/14/2015  . Influenza-Unspecified 04/16/2013, 03/31/2014, 04/11/2016, 04/10/2017  . Pneumococcal Conjugate-13 09/08/2015  . Pneumococcal Polysaccharide-23 03/17/1997  . Tdap 10/09/2011   Pertinent  Health Maintenance Due  Topic Date Due  . INFLUENZA VACCINE  Completed  . DEXA SCAN  Completed  . PNA vac Low Risk Adult  Completed   Fall Risk  01/29/2017 12/31/2016 12/25/2016 10/10/2016 08/28/2016  Falls in the past year? Yes Yes Yes Yes Yes  Number falls in past yr: 1 2 or more 2 or more 1 1  Injury with Fall? No Yes Yes - No   Functional Status Survey:    Vitals:   07/17/17 1450  BP: (!) 161/79  Pulse: 74  Resp: 18  Temp: 98.4 F (36.9 C)  SpO2: 96%   There is no height or weight on file to calculate BMI. Physical Exam  Constitutional: She is oriented to person, place, and time. She appears well-developed and well-nourished.  HENT:  Nose: Nose normal. No rhinorrhea. Right sinus exhibits no maxillary sinus  tenderness and no frontal sinus tenderness. Left sinus exhibits no maxillary sinus tenderness and no frontal sinus tenderness.  Mouth/Throat: Uvula is midline, oropharynx is clear and moist and mucous membranes are normal.  Cardiovascular: Normal rate, regular rhythm, normal heart sounds and intact distal pulses.  Pulmonary/Chest: Effort normal and breath sounds normal. No respiratory distress. She has no decreased breath sounds. She has no wheezes.  No cough noted during exam.   Lymphadenopathy:       Head (right side): No submental, no submandibular, no tonsillar, no preauricular, no posterior auricular and no occipital adenopathy present.       Head (left side): No submental, no submandibular, no tonsillar, no preauricular, no posterior auricular and no occipital adenopathy present.  Neurological: She is alert and oriented to person, place, and time.  Skin: Skin is warm and dry.  Nursing note and vitals reviewed.   Labs reviewed: Recent Labs    10/29/16 1220 12/26/16 0700 01/30/17 0700  NA 136 131* 133*  K 3.1* 4.1 4.3  CL 99*  --   --   CO2 28  --   --   GLUCOSE 95  --   --   BUN 13 15 17   CREATININE 0.58 0.6 0.5  CALCIUM 8.7*  --   --    Recent Labs    10/29/16 1220 12/26/16 0700 01/30/17 0700  AST 39 29 30  ALT 18 14 17   ALKPHOS 79 96 125  BILITOT 0.8  --   --   PROT 6.7  --   --   ALBUMIN 3.3*  --   --    Recent Labs    10/29/16 1220 12/26/16 0700 01/30/17 0700  WBC 4.4 6.1 7.0  NEUTROABS 2.9  --   --   HGB 11.0* 12.6 11.8*  HCT 33.4* 37 35*  MCV 91.3  --   --   PLT 215 304 283   Lab Results  Component Value Date   TSH 2.38 01/30/2017   Lab Results  Component Value Date   HGBA1C 5.9 12/02/2006   Lab Results  Component Value Date   CHOL 174 11/07/2015   HDL 79 (A) 11/07/2015   LDLCALC 78 11/07/2015   LDLDIRECT 123.7 12/08/2007   TRIG 87 11/07/2015   CHOLHDL 3 07/16/2011    Significant Diagnostic Results in last 30 days:  No results  found.  Assessment/Plan  1. Cough in adult Unable to appreciate cough during exam. Lung sounds are clear bilaterally, lack of fevers, and clear production at this time to render need for CXR. Will plan to treat with Duonebulizers BID for 5 days and also monitor VS during the course of treatment daily. Will plan to reassess PRN per RN staff reporting of patients response to treatment.    Family/ staff Communication: Communicated with RN and patient.  Labs/tests ordered:  n/a

## 2017-07-21 DIAGNOSIS — N390 Urinary tract infection, site not specified: Secondary | ICD-10-CM | POA: Diagnosis not present

## 2017-07-21 DIAGNOSIS — N2889 Other specified disorders of kidney and ureter: Secondary | ICD-10-CM | POA: Diagnosis not present

## 2017-07-21 DIAGNOSIS — T83511A Infection and inflammatory reaction due to indwelling urethral catheter, initial encounter: Secondary | ICD-10-CM | POA: Diagnosis not present

## 2017-07-21 DIAGNOSIS — Z79899 Other long term (current) drug therapy: Secondary | ICD-10-CM | POA: Diagnosis not present

## 2017-07-21 DIAGNOSIS — R05 Cough: Secondary | ICD-10-CM | POA: Diagnosis not present

## 2017-07-22 DIAGNOSIS — D649 Anemia, unspecified: Secondary | ICD-10-CM | POA: Diagnosis not present

## 2017-07-22 DIAGNOSIS — J189 Pneumonia, unspecified organism: Secondary | ICD-10-CM | POA: Diagnosis not present

## 2017-07-22 LAB — BASIC METABOLIC PANEL
BUN: 11 (ref 4–21)
Creatinine: 0.5 (ref 0.5–1.1)
Glucose: 88
Potassium: 4.6 (ref 3.4–5.3)
Sodium: 134 — AB (ref 137–147)

## 2017-07-22 LAB — CBC AND DIFFERENTIAL
HCT: 37 (ref 36–46)
Hemoglobin: 12.3 (ref 12.0–16.0)
Platelets: 268 (ref 150–399)
WBC: 6.4

## 2017-07-23 ENCOUNTER — Non-Acute Institutional Stay: Payer: Medicare Other | Admitting: Internal Medicine

## 2017-07-23 ENCOUNTER — Encounter: Payer: Self-pay | Admitting: Internal Medicine

## 2017-07-23 VITALS — BP 150/80 | HR 80 | Temp 97.9°F | Wt 105.0 lb

## 2017-07-23 DIAGNOSIS — F0390 Unspecified dementia without behavioral disturbance: Secondary | ICD-10-CM

## 2017-07-23 DIAGNOSIS — N952 Postmenopausal atrophic vaginitis: Secondary | ICD-10-CM

## 2017-07-23 DIAGNOSIS — Z229 Carrier of infectious disease, unspecified: Secondary | ICD-10-CM

## 2017-07-23 DIAGNOSIS — J159 Unspecified bacterial pneumonia: Secondary | ICD-10-CM

## 2017-07-23 DIAGNOSIS — R339 Retention of urine, unspecified: Secondary | ICD-10-CM

## 2017-07-23 NOTE — Progress Notes (Signed)
Location:  [facility] of Service:  Clinic (12)  Provider: Stacie Knutzen L. Mariea Clonts, D.O., C.M.D.  Code Status: DNR Goals of Care:  Advanced Directives 03/26/2017  Does Patient Have a Medical Advance Directive? Yes  Type of Advance Directive Out of facility DNR (pink MOST or yellow form);[place];Living will  Does patient want to make changes to medical advance directive? -  Copy of [place] in Chart? Yes  Pre-existing out of facility DNR order (yellow form or pink MOST form) Yellow form placed in chart (order not valid for inpatient use);Pink MOST form placed in chart (order not valid for inpatient use)     Chief Complaint  Patient presents with  . Acute Visit    pneumonia    HPI: Patient is a 84 y.o. female seen today for an acute visit for ongoing pneumonia--seen 1/31 by NP Wert for cough for 4 days.  She had an intermittent, dry, hard cough sometimes productive of white mucus.  She missed a couple of meals in the dining room.  At that time, she was started on duonebs bid for 5 days and more frequent VS checks in the daytime.  Apparently, she was not improving, so CXR done.  Pt was diagnosed by CXR on Monday and started on levaquin therapy by NP.  Apparently, her urine got checked again 07/21/17, too, and is now growing out 100K of lactose fermenting gram negative rods.  No idea why this got done b/c pt has chronic frequency and incontinence.  Has retention and gets I/O cathed.  Says the levaquin previously made her "crawl the walls" and is interfering with her sleep at night.  Pt up urinating multiple times each night (or trying to urinate).    Past Medical History:  Diagnosis Date  . Anemia, unspecified   . Asymptomatic varicose veins   . Carotid bruit    hx of  . Carotid bruit    doppler normal in the past  . Diverticulosis   . Dyslipidemia   . Esophageal reflux   . Female bladder prolapse, acquired 07/17/2010  . Fibroid   . Full  incontinence of feces   . Hemorrhoid    1963 surgical excision  . History of colon cancer    Adenocarcinoma of right colon, stage T2, N1.s/p resection/chemotherapy 2002  . HTN (hypertension)   . Hx of colonic polyps    1965 surgical excision  . Hypothyroidism   . Idiopathic osteoporosis   . Idiopathic osteoporosis   . Internal hemorrhoids without mention of complication    bleed easily  . Irritable bowel syndrome   . Melanoma of skin, site unspecified   . MVP (mitral valve prolapse)   . OA (osteoarthritis)    left hip  . Osteoarthrosis, unspecified whether generalized or localized, pelvic region and thigh   . Peripheral neuropathy    hands/ feet  . 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 Spinal stenosis, unspecified region other than cervical   . Vaginal bleeding, abnormal   . Vaginitis, atrophic   . Varicose veins     Past Surgical History:  Procedure Laterality Date  . COLONOSCOPY W/ POLYPECTOMY  2006   3 mm polyp destroyed, diverticulosis  . DILATION AND CURETTAGE OF UTERUS    . ESOPHAGOGASTRODUODENOSCOPY  2009   mild gastritis  . HEMICOLECTOMY  2002   right  . [place]  . HYSTEROSCOPY    . MELANOMA EXCISION      Allergies  Allergen Reactions  . Amoxil [Amoxicillin] Diarrhea    Has patient had a PCN reaction causing immediate rash, facial/tongue/throat swelling, SOB or lightheadedness with hypotension: no Has patient had a PCN reaction causing severe rash involving mucus membranes or skin necrosis: no Has patient had a PCN reaction that required hospitalization : no Has patient had a PCN reaction occurring within the last 10 years: no If all of the above answers are "NO", then may proceed with Cephalosporin use.   Otho Darner Allergy]     sensitivity  . Demerol     unknown  . Meperidine Hcl Other (See Comments)    Drop in blood pressure  . Infed [Iron Dextran] Rash    Became very red over face, scalp, and arms  . Neosporin  [Neomycin-Bacitracin Zn-Polymyx] Rash    unknown    Outpatient Encounter Medications as of 07/23/2017  Medication Sig  . acetaminophen (TYLENOL) 650 MG CR tablet Take 650 mg by mouth 2 (two) times daily.  Marland Kitchen ALPRAZolam (XANAX) 0.25 MG tablet TAKE 1 TABLET AT BEDTIME AS NEEDED FOR ANXIETY. (Patient not taking: Reported on 07/17/2017)  . antiseptic oral rinse (BIOTENE) LIQD 15 mLs by Mouth Rinse route 3 (three) times daily.  . Artificial Tear Ointment (ARTIFICIAL TEARS) ointment Place 2 drops into the left eye 2 (two) times daily.  . cholecalciferol (VITAMIN D) 1000 UNITS tablet Take 1 tablet (1,000 Units total) by mouth daily.  . Cranberry (ELLURA PO) Take 1 capsule by mouth daily.  . hydrocortisone (ANUSOL-HC) 2.5 % rectal cream Place 1 application rectally daily as needed for hemorrhoids or itching.   . levothyroxine (SYNTHROID, LEVOTHROID) 75 MCG tablet TAKE 1 TABLET DAILY BEFORE BREAKFAST FOR THYROID.  Marland Kitchen methenamine (HIPREX) 1 g tablet Take 1 g by mouth at bedtime.  . methylcellulose (CITRUCEL FIBERSHAKE) oral powder Take 1 packet by mouth every other day.   . phenazopyridine (PYRIDIUM) 200 MG tablet Take 200 mg by mouth 3 (three) times daily as needed for pain.  Vladimir Faster Glycol-Propyl Glycol (SYSTANE) 0.4-0.3 % SOLN Place 2 drops into the right eye 2 (two) times daily.  . Potassium Chloride ER 20 MEQ TBCR Take 20 mEq by mouth 2 (two) times daily.  . Probiotic Product (ALIGN) 4 MG CAPS Take 1 tablet by mouth daily.    . ranitidine (ZANTAC) 150 MG tablet Take 150 mg by mouth 2 (two) times daily as needed for heartburn.   . simethicone (MYLICON) 258 MG chewable tablet Chew 125 mg by mouth 3 (three) times daily as needed for flatulence.  . vitamin C (ASCORBIC ACID) 500 MG tablet Take 500 mg by mouth daily.   No facility-administered encounter medications on file as of 07/23/2017.     Review of Systems:  Review of Systems  Constitutional: Positive for malaise/fatigue. Negative for chills and  fever.  HENT: Negative for congestion.   Eyes: Negative for blurred vision.  Respiratory: Positive for cough and shortness of breath. Negative for wheezing.   Cardiovascular: Negative for chest pain, palpitations and leg swelling.  Gastrointestinal: Negative for abdominal pain, blood in stool, constipation and melena.  Genitourinary: Positive for dysuria, frequency and urgency.       All chronic  Musculoskeletal: Positive for joint pain. Negative for falls.  Neurological: Positive for weakness. Negative for dizziness and loss of consciousness.  Psychiatric/Behavioral: Positive for memory loss. The patient is nervous/anxious.     Health Maintenance  Topic Date Due  . TETANUS/TDAP  10/08/2021  . INFLUENZA VACCINE  Completed  . DEXA SCAN  Completed  . PNA vac Low Risk Adult  Completed    Physical Exam: Vitals:   07/23/17 0908  BP: (!) 150/80  Pulse: 80  Temp: 97.9 F (36.6 C)  TempSrc: Oral  SpO2: 96%  Weight: 105 lb (47.6 kg)   Body mass index is 23.54 kg/m. Physical Exam  Constitutional: No distress.  HENT:  Head: Normocephalic and atraumatic.  Cardiovascular: Normal rate, regular rhythm, normal heart sounds and intact distal pulses.  Pulmonary/Chest:  Coarse rhonchi throughout bilateral lung fields anteriorly and posteriorly; dyspneic on exertion  Abdominal: Bowel sounds are normal.  Musculoskeletal:  Ambulates with rollator walker, brace on ankle  Neurological: She is alert.  Oriented to person and place, poor historian--time-frame is mixed up  Skin: Skin is warm and dry. There is pallor.  Psychiatric:  Anxious, pressured speech    Labs reviewed: Basic Metabolic Panel: Recent Labs    10/29/16 1220 12/26/16 0700 01/30/17 0700  NA 136 131* 133*  K 3.1* 4.1 4.3  CL 99*  --   --   CO2 28  --   --   GLUCOSE 95  --   --   BUN 13 15 17   CREATININE 0.58 0.6 0.5  CALCIUM 8.7*  --   --   TSH  --   --  2.38   Liver Function Tests: Recent Labs     10/29/16 1220 12/26/16 0700 01/30/17 0700  AST 39 29 30  ALT 18 14 17   ALKPHOS 79 96 125  BILITOT 0.8  --   --   PROT 6.7  --   --   ALBUMIN 3.3*  --   --    No results for input(s): LIPASE, AMYLASE in the last 8760 hours. No results for input(s): AMMONIA in the last 8760 hours. CBC: Recent Labs    10/29/16 1220 12/26/16 0700 01/30/17 0700  WBC 4.4 6.1 7.0  NEUTROABS 2.9  --   --   HGB 11.0* 12.6 11.8*  HCT 33.4* 37 35*  MCV 91.3  --   --   PLT 215 304 283   Lipid Panel: No results for input(s): CHOL, HDL, LDLCALC, TRIG, CHOLHDL, LDLDIRECT in the last 8760 hours. Lab Results  Component Value Date   HGBA1C 5.9 12/02/2006    Assessment/Plan 1. Bacterial lobar pneumonia -complete 7 days of levaquin 500mg  po daily, cont probiotic, pushing fluids, duonebs also thru the 7 days (initially was 5 days)  2. Urinary retention -chronic, continue I/O caths  3. Atrophic vaginitis -causes some degree of chronic dysuria  4. Dementia without behavioral disturbance, unspecified dementia type -progressing gradually, has some superimposed delirium secondary to her pneumonia right now  5. Colonization status -has never had a clean urine sample in the 3 years I've been here -stop checking urine especially without localizing NEW symptoms -continue hiprex, cranberry, pushing water intake -also followed by urology who just treated another UTI with bactrim early January for 2 wks -current urine culture with gr neg rod with final c+s pending--would not tx this given pt has known pneumonia to explain her cough and does not meet McGeer criteria, is colonized  Labs/tests ordered:  No orders of the defined types were placed in this encounter.   Next appt:  07/30/2017  Fairy Ashlock L. Ophia Shamoon, D.O. [facility] [address], [place] [address] Cell Phone (Mon-Fri 8am-5pm):  [phone] On Call:  [phone] & follow prompts after 5pm &  weekends Office Phone:  [phone] Office Fax:  [phone]

## 2017-07-29 ENCOUNTER — Encounter: Payer: Self-pay | Admitting: Internal Medicine

## 2017-07-30 ENCOUNTER — Encounter: Payer: Self-pay | Admitting: Internal Medicine

## 2017-07-30 ENCOUNTER — Non-Acute Institutional Stay: Payer: Medicare Other | Admitting: Internal Medicine

## 2017-07-30 VITALS — BP 120/70 | HR 79 | Temp 98.2°F | Wt 106.0 lb

## 2017-07-30 DIAGNOSIS — H353230 Exudative age-related macular degeneration, bilateral, stage unspecified: Secondary | ICD-10-CM

## 2017-07-30 DIAGNOSIS — G629 Polyneuropathy, unspecified: Secondary | ICD-10-CM | POA: Diagnosis not present

## 2017-07-30 DIAGNOSIS — F0390 Unspecified dementia without behavioral disturbance: Secondary | ICD-10-CM

## 2017-07-30 DIAGNOSIS — Z229 Carrier of infectious disease, unspecified: Secondary | ICD-10-CM

## 2017-07-30 DIAGNOSIS — F5104 Psychophysiologic insomnia: Secondary | ICD-10-CM | POA: Diagnosis not present

## 2017-07-30 DIAGNOSIS — J159 Unspecified bacterial pneumonia: Secondary | ICD-10-CM

## 2017-07-30 NOTE — Progress Notes (Signed)
Location:  [facility] of Service:  Clinic (12)  Provider: Alania Overholt L. Mariea Clonts, D.O., C.M.D.  Code Status: DNR Goals of Care:  Advanced Directives 07/23/2017  Does Patient Have a Medical Advance Directive? Yes  Type of Paramedic of [place];Living will;Out of facility DNR (pink MOST or yellow form)  Does patient want to make changes to medical advance directive? No - Patient declined  Copy of [place] in Chart? Yes  Pre-existing out of facility DNR order (yellow form or pink MOST form) Yellow form placed in chart (order not valid for inpatient use);Pink MOST form placed in chart (order not valid for inpatient use)   Chief Complaint  Patient presents with  . Medical Management of Chronic Issues    24mth follow-up  . ACP    DNR, MOST, LIVING WILL, HCPOA    HPI: Patient is a 84 y.o. female seen today for medical management of chronic diseases.  She also had pneumonia and was on levaquin therapy.    She feels much better.  She slept very well 2 nights after she completed the levaquin.  Says she was only up twice to urinate after her I/O cath.  Usually is up 4-5 times.    Left foot aches all of the time due to neuropathy.  At night, this bothers her when she is awake.  She also has foot drop.  Uses a brace.  levaquin covered her pneumonia and UTI.  She is not coughing much at all.  It's impossible to tell if the urine is any different b/c of her chronic frequency, urgency, and incontinence.  Past Medical History:  Diagnosis Date  . Anemia, unspecified   . Asymptomatic varicose veins   . Carotid bruit    hx of  . Carotid bruit    doppler normal in the past  . Diverticulosis   . Dyslipidemia   . Esophageal reflux   . Female bladder prolapse, acquired 07/17/2010  . Fibroid   . Full incontinence of feces   . Hemorrhoid    1963 surgical excision  . History of colon cancer    Adenocarcinoma of right colon, stage  T2, N1.s/p resection/chemotherapy 2002  . HTN (hypertension)   . Hx of colonic polyps    1965 surgical excision  . Hypothyroidism   . Idiopathic osteoporosis   . Idiopathic osteoporosis   . Internal hemorrhoids without mention of complication    bleed easily  . Irritable bowel syndrome   . Melanoma of skin, site unspecified   . MVP (mitral valve prolapse)   . OA (osteoarthritis)    left hip  . Osteoarthrosis, unspecified whether generalized or localized, pelvic region and thigh   . Peripheral neuropathy    hands/ feet  . 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 Spinal stenosis, unspecified region other than cervical   . Vaginal bleeding, abnormal   . Vaginitis, atrophic   . Varicose veins     Past Surgical History:  Procedure Laterality Date  . COLONOSCOPY W/ POLYPECTOMY  2006   3 mm polyp destroyed, diverticulosis  . DILATION AND CURETTAGE OF UTERUS    . ESOPHAGOGASTRODUODENOSCOPY  2009   mild gastritis  . HEMICOLECTOMY  2002   right  . [place]  . HYSTEROSCOPY    . MELANOMA EXCISION      Allergies  Allergen Reactions  . Amoxil [Amoxicillin] Diarrhea    Has patient had a PCN reaction causing immediate rash,  facial/tongue/throat swelling, SOB or lightheadedness with hypotension: no Has patient had a PCN reaction causing severe rash involving mucus membranes or skin necrosis: no Has patient had a PCN reaction that required hospitalization : no Has patient had a PCN reaction occurring within the last 10 years: no If all of the above answers are "NO", then may proceed with Cephalosporin use.   Otho Darner Allergy]     sensitivity  . Demerol     unknown  . Meperidine Hcl Other (See Comments)    Drop in blood pressure  . Infed [Iron Dextran] Rash    Became very red over face, scalp, and arms  . Neosporin [Neomycin-Bacitracin Zn-Polymyx] Rash    unknown    Outpatient Encounter Medications as of 07/30/2017  Medication Sig  . acetaminophen  (TYLENOL) 650 MG CR tablet Take 650 mg by mouth 2 (two) times daily.  Marland Kitchen antiseptic oral rinse (BIOTENE) LIQD 15 mLs by Mouth Rinse route 3 (three) times daily.  . Artificial Tear Ointment (ARTIFICIAL TEARS) ointment Place 2 drops into the left eye 2 (two) times daily.  . cholecalciferol (VITAMIN D) 1000 UNITS tablet Take 1 tablet (1,000 Units total) by mouth daily.  . Cranberry (ELLURA PO) Take 1 capsule by mouth daily.  . Eyelid Cleansers (OCUSOFT EYELID CLEANSING) PADS Apply topically as needed.  . hydrocortisone (ANUSOL-HC) 2.5 % rectal cream Place 1 application rectally daily as needed for hemorrhoids or itching.   . levothyroxine (SYNTHROID, LEVOTHROID) 75 MCG tablet TAKE 1 TABLET DAILY BEFORE BREAKFAST FOR THYROID.  . Melatonin 5 MG TABS Take by mouth at bedtime.  . methenamine (HIPREX) 1 g tablet Take 1 g by mouth at bedtime.  . methylcellulose (CITRUCEL FIBERSHAKE) oral powder Take 1 packet by mouth every other day.   . phenazopyridine (PYRIDIUM) 200 MG tablet Take 200 mg by mouth 3 (three) times daily as needed for pain.  Vladimir Faster Glycol-Propyl Glycol (SYSTANE) 0.4-0.3 % SOLN Place 2 drops into the right eye 2 (two) times daily.  . Potassium Chloride ER 20 MEQ TBCR Take 20 mEq by mouth 2 (two) times daily.  . Probiotic Product (ALIGN) 4 MG CAPS Take 1 tablet by mouth daily.    Marland Kitchen saccharomyces boulardii (FLORASTOR) 250 MG capsule Take 250 mg by mouth daily.  . sodium chloride (OCEAN) 0.65 % SOLN nasal spray Place 1 spray into both nostrils as needed for congestion.  . vitamin C (ASCORBIC ACID) 500 MG tablet Take 500 mg by mouth daily.   No facility-administered encounter medications on file as of 07/30/2017.     Review of Systems:  Review of Systems  Constitutional: Negative for chills, fever and malaise/fatigue.       Energy better  HENT: Positive for hearing loss. Negative for congestion.   Eyes: Positive for blurred vision.       Macular degeneration  Respiratory: Negative  for cough and shortness of breath.   Cardiovascular: Negative for chest pain, palpitations and leg swelling.  Gastrointestinal: Positive for constipation. Negative for abdominal pain, blood in stool, diarrhea and melena.       Hemorrhoids  Genitourinary: Negative for dysuria.  Musculoskeletal: Negative for falls.  Skin: Negative for itching and rash.  Neurological: Positive for tingling and sensory change. Negative for dizziness, loss of consciousness and weakness.       Left foot with left foot drop  Psychiatric/Behavioral: Positive for memory loss. The patient is nervous/anxious and has insomnia.     Health Maintenance  Topic Date Due  .  TETANUS/TDAP  10/08/2021  . INFLUENZA VACCINE  Completed  . DEXA SCAN  Completed  . PNA vac Low Risk Adult  Completed    Physical Exam: Vitals:   07/30/17 1004  BP: 120/70  Pulse: 79  Temp: 98.2 F (36.8 C)  TempSrc: Oral  SpO2: 97%  Weight: 106 lb (48.1 kg)   Body mass index is 23.76 kg/m. Physical Exam  Constitutional: No distress.  Looks much better, color back  HENT:  Head: Normocephalic and atraumatic.  Eyes:  glasses  Cardiovascular: Normal rate, regular rhythm and intact distal pulses.  Murmur heard. Pulmonary/Chest: Effort normal and breath sounds normal. No respiratory distress.  Few rhonchi, lungs clear  Abdominal: Soft. Bowel sounds are normal. She exhibits no distension. There is no tenderness.  Musculoskeletal: Normal range of motion.  Walks with walker and has left foot drop with brace  Neurological: She is alert.  Mentation much clearer and able to focus and listen to me speak today; oriented to person and place  Skin: Skin is warm and dry.  Psychiatric: She has a normal mood and affect.    Labs reviewed: Basic Metabolic Panel: Recent Labs    10/29/16 1220 12/26/16 0700 01/30/17 0700 07/22/17 0300  NA 136 131* 133* 134*  K 3.1* 4.1 4.3 4.6  CL 99*  --   --   --   CO2 28  --   --   --   GLUCOSE 95  --    --   --   BUN 13 15 17 11   CREATININE 0.58 0.6 0.5 0.5  CALCIUM 8.7*  --   --   --   TSH  --   --  2.38  --    Liver Function Tests: Recent Labs    10/29/16 1220 12/26/16 0700 01/30/17 0700  AST 39 29 30  ALT 18 14 17   ALKPHOS 79 96 125  BILITOT 0.8  --   --   PROT 6.7  --   --   ALBUMIN 3.3*  --   --    No results for input(s): LIPASE, AMYLASE in the last 8760 hours. No results for input(s): AMMONIA in the last 8760 hours. CBC: Recent Labs    10/29/16 1220 12/26/16 0700 01/30/17 0700 07/22/17 0300  WBC 4.4 6.1 7.0 6.4  NEUTROABS 2.9  --   --   --   HGB 11.0* 12.6 11.8* 12.3  HCT 33.4* 37 35* 37  MCV 91.3  --   --   --   PLT 215 304 283 268   Lipid Panel: No results for input(s): CHOL, HDL, LDLCALC, TRIG, CHOLHDL, LDLDIRECT in the last 8760 hours. Lab Results  Component Value Date   HGBA1C 5.9 12/02/2006   Assessment/Plan 1. Bacterial lobar pneumonia -seems to be resolved after 7 days of levaquin, much better clinically  2. Colonization status -urine colonized, not to check without clearing with me first as pt with chronic nocturia, frequency, urgency and incontinence--would only check if new abdominal pain (that's not constipation), hematuria, flank pain or fever that are not explained by another type of infection  3. Dementia without behavioral disturbance, unspecified dementia type -cognition improved after abx for pneumonia which also covered "UTI"  4. Psychophysiological insomnia -has anxiety and nocturia that interfere -will see if addition of gabapentin for neuropathy low dose also helps sleep; if not, use the melatonin that's been ordered in the past   5. Neuropathy -add gabapentin 166m po qhs for neuropathy to see if  it helps the left foot and sleep  Labs/tests ordered:   Orders Placed This Encounter  Procedures  . Do not attempt resuscitation (DNR)    Discussed at clinic visit, scanned copy should be in documents and media    Order Specific  Question:   In the event of cardiac or respiratory ARREST    Answer:   Do not call a "code blue"    Order Specific Question:   In the event of cardiac or respiratory ARREST    Answer:   Do not perform Intubation, CPR, defibrillation or ACLS    Order Specific Question:   In the event of cardiac or respiratory ARREST    Answer:   Use medication by any route, position, wound care, and other measures to relive pain and suffering. May use oxygen, suction and manual treatment of airway obstruction as needed for comfort.    Next appt:  11/26/2017 med mgt   Venera Privott L. Etai Copado, D.O. [facility] [address], [place] [address] Cell Phone (Mon-Fri 8am-5pm):  [phone] On Call:  [phone] & follow prompts after 5pm & weekends Office Phone:  [phone] Office Fax:  [phone]

## 2017-08-21 ENCOUNTER — Encounter (INDEPENDENT_AMBULATORY_CARE_PROVIDER_SITE_OTHER): Payer: Self-pay | Admitting: Ophthalmology

## 2017-08-21 DIAGNOSIS — N312 Flaccid neuropathic bladder, not elsewhere classified: Secondary | ICD-10-CM | POA: Diagnosis not present

## 2017-08-21 DIAGNOSIS — D3000 Benign neoplasm of unspecified kidney: Secondary | ICD-10-CM | POA: Diagnosis not present

## 2017-08-21 DIAGNOSIS — N3020 Other chronic cystitis without hematuria: Secondary | ICD-10-CM | POA: Diagnosis not present

## 2017-08-22 ENCOUNTER — Encounter (INDEPENDENT_AMBULATORY_CARE_PROVIDER_SITE_OTHER): Payer: Medicare Other | Admitting: Ophthalmology

## 2017-08-22 DIAGNOSIS — H35033 Hypertensive retinopathy, bilateral: Secondary | ICD-10-CM | POA: Diagnosis not present

## 2017-08-22 DIAGNOSIS — H353122 Nonexudative age-related macular degeneration, left eye, intermediate dry stage: Secondary | ICD-10-CM | POA: Diagnosis not present

## 2017-08-22 DIAGNOSIS — H43813 Vitreous degeneration, bilateral: Secondary | ICD-10-CM | POA: Diagnosis not present

## 2017-08-22 DIAGNOSIS — H353211 Exudative age-related macular degeneration, right eye, with active choroidal neovascularization: Secondary | ICD-10-CM | POA: Diagnosis not present

## 2017-08-22 DIAGNOSIS — H2513 Age-related nuclear cataract, bilateral: Secondary | ICD-10-CM | POA: Diagnosis not present

## 2017-08-22 DIAGNOSIS — I10 Essential (primary) hypertension: Secondary | ICD-10-CM | POA: Diagnosis not present

## 2017-09-18 ENCOUNTER — Ambulatory Visit (INDEPENDENT_AMBULATORY_CARE_PROVIDER_SITE_OTHER): Payer: Medicare Other | Admitting: Internal Medicine

## 2017-09-18 ENCOUNTER — Encounter: Payer: Self-pay | Admitting: Internal Medicine

## 2017-09-18 VITALS — BP 120/60 | HR 62 | Temp 98.0°F | Ht <= 58 in | Wt 108.2 lb

## 2017-09-18 DIAGNOSIS — K625 Hemorrhage of anus and rectum: Secondary | ICD-10-CM | POA: Diagnosis not present

## 2017-09-18 DIAGNOSIS — Z229 Carrier of infectious disease, unspecified: Secondary | ICD-10-CM | POA: Diagnosis not present

## 2017-09-18 DIAGNOSIS — R339 Retention of urine, unspecified: Secondary | ICD-10-CM | POA: Diagnosis not present

## 2017-09-18 NOTE — Progress Notes (Signed)
Location:  [facility] clinic Provider: Jathan Balling L. Mariea Clonts, D.O., C.M.D.  Code Status: DNR Goals of Care:  Advanced Directives 07/23/2017  Does Patient Have a Medical Advance Directive? Yes  Type of Paramedic of [place];Living will;Out of facility DNR (pink MOST or yellow form)  Does patient want to make changes to medical advance directive? No - Patient declined  Copy of [place] in Chart? Yes  Pre-existing out of facility DNR order (yellow form or pink MOST form) Yellow form placed in chart (order not valid for inpatient use);Pink MOST form placed in chart (order not valid for inpatient use)   Chief Complaint  Patient presents with  . Acute Visit    Pt having s/s of diarrhea;pt stated she is having black stools onset 1 week and frequent urination; Sitter from [place] is with her  . Medication Refill    No Refills  . ACP    HPOA, LW, on file    HPI: Patient is a 84 y.o. female seen today for an acute visit for diarrhea with black stool for past week and urinary frequency.  Had no bms yesterday, but was having liquid black bms for days before that.  It was upsetting her.  Stopped in [employer] to get a book, walked back home, had an accident in the bathroom before getting to the toilet.  Has not changed her diet any. Normally, has her morning bran cereal or cooked cereal.  She is concerned that she may have cancer of her rectum.    Past Medical History:  Diagnosis Date  . Anemia, unspecified   . Asymptomatic varicose veins   . Carotid bruit    hx of  . Carotid bruit    doppler normal in the past  . Diverticulosis   . Dyslipidemia   . Esophageal reflux   . Female bladder prolapse, acquired 07/17/2010  . Fibroid   . Full incontinence of feces   . Hemorrhoid    1963 surgical excision  . History of colon cancer    Adenocarcinoma of right colon, stage T2, N1.s/p resection/chemotherapy 2002  . HTN (hypertension)   . Hx of colonic polyps    1965  surgical excision  . Hypothyroidism   . Idiopathic osteoporosis   . Idiopathic osteoporosis   . Internal hemorrhoids without mention of complication    bleed easily  . Irritable bowel syndrome   . Melanoma of skin, site unspecified   . MVP (mitral valve prolapse)   . OA (osteoarthritis)    left hip  . Osteoarthrosis, unspecified whether generalized or localized, pelvic region and thigh   . Peripheral neuropathy    hands/ feet  . 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 Spinal stenosis, unspecified region other than cervical   . Vaginal bleeding, abnormal   . Vaginitis, atrophic   . Varicose veins     Past Surgical History:  Procedure Laterality Date  . COLONOSCOPY W/ POLYPECTOMY  2006   3 mm polyp destroyed, diverticulosis  . DILATION AND CURETTAGE OF UTERUS    . ESOPHAGOGASTRODUODENOSCOPY  2009   mild gastritis  . HEMICOLECTOMY  2002   right  . [place]  . HYSTEROSCOPY    . MELANOMA EXCISION      Allergies  Allergen Reactions  . Amoxil [Amoxicillin] Diarrhea    Has patient had a PCN reaction causing immediate rash, facial/tongue/throat swelling, SOB or lightheadedness with hypotension: no Has patient had a PCN reaction causing severe rash  involving mucus membranes or skin necrosis: no Has patient had a PCN reaction that required hospitalization : no Has patient had a PCN reaction occurring within the last 10 years: no If all of the above answers are "NO", then may proceed with Cephalosporin use.   Otho Darner Allergy]     sensitivity  . Demerol     unknown  . Meperidine Hcl Other (See Comments)    Drop in blood pressure  . Infed [Iron Dextran] Rash    Became very red over face, scalp, and arms  . Neosporin [Neomycin-Bacitracin Zn-Polymyx] Rash    unknown    Outpatient Encounter Medications as of 09/18/2017  Medication Sig  . acetaminophen (TYLENOL) 650 MG CR tablet Take 650 mg by mouth 2 (two) times daily.  Marland Kitchen antiseptic oral rinse (BIOTENE)  LIQD 15 mLs by Mouth Rinse route 3 (three) times daily.  . Artificial Tear Ointment (ARTIFICIAL TEARS) ointment Place 2 drops into the left eye 2 (two) times daily.  . cholecalciferol (VITAMIN D) 1000 UNITS tablet Take 1 tablet (1,000 Units total) by mouth daily.  . Cranberry (ELLURA PO) Take 1 capsule by mouth daily.  . Eyelid Cleansers (OCUSOFT EYELID CLEANSING) PADS Apply topically as needed.  . hydrocortisone (ANUSOL-HC) 2.5 % rectal cream Place 1 application rectally daily as needed for hemorrhoids or itching.   . levothyroxine (SYNTHROID, LEVOTHROID) 75 MCG tablet TAKE 1 TABLET DAILY BEFORE BREAKFAST FOR THYROID.  . Melatonin 5 MG TABS Take by mouth at bedtime.  . methenamine (HIPREX) 1 g tablet Take 1 g by mouth at bedtime.  . methylcellulose (CITRUCEL FIBERSHAKE) oral powder Take 1 packet by mouth every other day.   . phenazopyridine (PYRIDIUM) 200 MG tablet Take 200 mg by mouth 3 (three) times daily as needed for pain.  Vladimir Faster Glycol-Propyl Glycol (SYSTANE) 0.4-0.3 % SOLN Place 2 drops into the right eye 2 (two) times daily.  . Potassium Chloride ER 20 MEQ TBCR Take 20 mEq by mouth 2 (two) times daily.  . Probiotic Product (ALIGN) 4 MG CAPS Take 1 tablet by mouth daily.    Marland Kitchen saccharomyces boulardii (FLORASTOR) 250 MG capsule Take 250 mg by mouth daily.  . sodium chloride (OCEAN) 0.65 % SOLN nasal spray Place 1 spray into both nostrils as needed for congestion.  . vitamin C (ASCORBIC ACID) 500 MG tablet Take 500 mg by mouth daily.   No facility-administered encounter medications on file as of 09/18/2017.     Review of Systems:  Review of Systems  Constitutional: Positive for malaise/fatigue. Negative for chills and fever.  HENT: Positive for hearing loss.   Eyes: Positive for blurred vision.  Respiratory: Negative for shortness of breath.   Cardiovascular: Negative for chest pain, palpitations and leg swelling.  Gastrointestinal: Positive for blood in stool, constipation,  diarrhea and melena. Negative for abdominal pain.  Genitourinary: Positive for frequency and urgency. Negative for dysuria, flank pain and hematuria.  Skin: Negative for rash.  Neurological: Positive for tingling and sensory change. Negative for dizziness.       Foot drop  Endo/Heme/Allergies: Bruises/bleeds easily.  Psychiatric/Behavioral: Positive for memory loss. The patient is nervous/anxious.     Health Maintenance  Topic Date Due  . INFLUENZA VACCINE  01/15/2018  . TETANUS/TDAP  10/08/2021  . DEXA SCAN  Completed  . PNA vac Low Risk Adult  Completed    Physical Exam: Vitals:   09/18/17 1114  BP: 120/60  Pulse: 62  Temp: 98 F (36.7 C)  TempSrc: Oral  SpO2: 99%  Weight: 108 lb 3.2 oz (49.1 kg)   Body mass index is 24.26 kg/m. Physical Exam  Constitutional:  Frail white female  Cardiovascular: Normal rate, regular rhythm and normal heart sounds.  Pulmonary/Chest: Effort normal and breath sounds normal. No respiratory distress.  Abdominal: Soft. Bowel sounds are normal. She exhibits no distension and no mass. There is no tenderness. There is no rebound and no guarding.  Genitourinary: Rectal exam shows guaiac positive stool.  Genitourinary Comments: Internal and external hemorrhoids; no tarry appearing stool only hard brown stool in rectal vault, but hemoccult positive during rectal exam, no visible bright red blood either, no tenderness during exam  Musculoskeletal: Normal range of motion.  Neurological: She is alert.  Oriented to person, place, some confusion about recent history  Skin: Skin is warm and dry. There is pallor.  Psychiatric:  Anxious and jittery    Labs reviewed: Basic Metabolic Panel: Recent Labs    10/29/16 1220 12/26/16 0700 01/30/17 0700 07/22/17  NA 136 131* 133* 134*  K 3.1* 4.1 4.3 4.6  CL 99*  --   --   --   CO2 28  --   --   --   GLUCOSE 95  --   --   --   BUN 13 15 17 11   CREATININE 0.58 0.6 0.5 0.5  CALCIUM 8.7*  --   --   --     TSH  --   --  2.38  --    Liver Function Tests: Recent Labs    10/29/16 1220 12/26/16 0700 01/30/17 0700  AST 39 29 30  ALT 18 14 17   ALKPHOS 79 96 125  BILITOT 0.8  --   --   PROT 6.7  --   --   ALBUMIN 3.3*  --   --    No results for input(s): LIPASE, AMYLASE in the last 8760 hours. No results for input(s): AMMONIA in the last 8760 hours. CBC: Recent Labs    10/29/16 1220 12/26/16 0700 01/30/17 0700 07/22/17  WBC 4.4 6.1 7.0 6.4  NEUTROABS 2.9  --   --   --   HGB 11.0* 12.6 11.8* 12.3  HCT 33.4* 37 35* 37  MCV 91.3  --   --   --   PLT 215 304 283 268   Lipid Panel: No results for input(s): CHOL, HDL, LDLCALC, TRIG, CHOLHDL, LDLDIRECT in the last 8760 hours. Lab Results  Component Value Date   HGBA1C 5.9 12/02/2006    Assessment/Plan 1. Rectal bleeding - pt is anxious that she has a recurrence of rectal cancer due to some dark stools she's had -suspect this is due to foods or medications she's taken and also has hemorrhoids -she's also made it clear time and time again that she would not want any surgeries or curative type treatments if this did happen -she requested a CEA be checked -also check cbc due to her history - CBC with Differential/Platelet - COMPLETE METABOLIC PANEL WITH GFR - CEA  2. Urinary retention -ongoing issue with frequent trips to restroom, need for I/O caths -she is frustrated about getting up so often at night -has not wanted a foley in the past and has recurrent UTIs as it is (or likely asymptomatic bacteriuria since she has some degree of chronic frequency, urgency and dysuria due to OAB, retention, prolapse, and atrophic vaginitis)  3. Colonization status -bladder is colonized with bacteria and she is incontinent of urine and  sometimes stool so clean catch sample and even I/O cath often contaminated -trying to avoid excess checking of urine without meeting McGeer criteria -cont to hydrate well  Labs/tests ordered:   Orders Placed  This Encounter  Procedures  . CBC with Differential/Platelet  . COMPLETE METABOLIC PANEL WITH GFR  . CEA    Next appt:  11/26/2017 med mgt  Chiara Coltrin L. Alejandro Adcox, D.O. [facility] [address], [place] [address] Cell Phone (Mon-Fri 8am-5pm):  [phone] On Call:  [phone] & follow prompts after 5pm & weekends Office Phone:  [phone] Office Fax:  [phone]

## 2017-09-18 NOTE — Patient Instructions (Addendum)
You have problems emptying your bladder which makes it seem like you have a urine infection (because you urinate often).  You have hemorrhoids which cause you to have blood in your stool.  We checked the CEA test to look for colon cancer to rule it out as the cause of your black tarry stools.  I suspect that it was from things you've eaten and the bowel medicines you received to prevent constipation.

## 2017-09-19 ENCOUNTER — Encounter: Payer: Self-pay | Admitting: *Deleted

## 2017-09-19 LAB — CBC WITH DIFFERENTIAL/PLATELET
Basophils Absolute: 20 cells/uL (ref 0–200)
Basophils Relative: 0.3 %
Eosinophils Absolute: 121 cells/uL (ref 15–500)
Eosinophils Relative: 1.8 %
HCT: 36.1 % (ref 35.0–45.0)
Hemoglobin: 12.6 g/dL (ref 11.7–15.5)
Lymphs Abs: 1307 cells/uL (ref 850–3900)
MCH: 31.2 pg (ref 27.0–33.0)
MCHC: 34.9 g/dL (ref 32.0–36.0)
MCV: 89.4 fL (ref 80.0–100.0)
MPV: 10.5 fL (ref 7.5–12.5)
Monocytes Relative: 8.1 %
Neutro Abs: 4710 cells/uL (ref 1500–7800)
Neutrophils Relative %: 70.3 %
Platelets: 253 10*3/uL (ref 140–400)
RBC: 4.04 10*6/uL (ref 3.80–5.10)
RDW: 12.4 % (ref 11.0–15.0)
Total Lymphocyte: 19.5 %
WBC mixed population: 543 cells/uL (ref 200–950)
WBC: 6.7 10*3/uL (ref 3.8–10.8)

## 2017-09-19 LAB — COMPLETE METABOLIC PANEL WITH GFR
AG Ratio: 1.3 (calc) (ref 1.0–2.5)
ALT: 9 U/L (ref 6–29)
AST: 21 U/L (ref 10–35)
Albumin: 4.1 g/dL (ref 3.6–5.1)
Alkaline phosphatase (APISO): 95 U/L (ref 33–130)
BUN/Creatinine Ratio: 28 (calc) — ABNORMAL HIGH (ref 6–22)
BUN: 15 mg/dL (ref 7–25)
CO2: 30 mmol/L (ref 20–32)
Calcium: 9.5 mg/dL (ref 8.6–10.4)
Chloride: 96 mmol/L — ABNORMAL LOW (ref 98–110)
Creat: 0.54 mg/dL — ABNORMAL LOW (ref 0.60–0.88)
GFR, Est African American: 91 mL/min/{1.73_m2} (ref >=60)
GFR, Est Non African American: 79 mL/min/{1.73_m2} (ref >=60)
Globulin: 3.2 g/dL (calc) (ref 1.9–3.7)
Glucose, Bld: 78 mg/dL (ref 65–139)
Potassium: 5.1 mmol/L (ref 3.5–5.3)
Sodium: 133 mmol/L — ABNORMAL LOW (ref 135–146)
Total Bilirubin: 0.5 mg/dL (ref 0.2–1.2)
Total Protein: 7.3 g/dL (ref 6.1–8.1)

## 2017-09-19 LAB — CEA: CEA: 1 ng/mL

## 2017-09-24 ENCOUNTER — Encounter: Payer: Self-pay | Admitting: Internal Medicine

## 2017-09-26 ENCOUNTER — Encounter: Payer: Self-pay | Admitting: Internal Medicine

## 2017-10-14 DIAGNOSIS — H903 Sensorineural hearing loss, bilateral: Secondary | ICD-10-CM | POA: Diagnosis not present

## 2017-10-24 ENCOUNTER — Encounter (INDEPENDENT_AMBULATORY_CARE_PROVIDER_SITE_OTHER): Payer: Medicare Other | Admitting: Ophthalmology

## 2017-10-24 DIAGNOSIS — H35033 Hypertensive retinopathy, bilateral: Secondary | ICD-10-CM | POA: Diagnosis not present

## 2017-10-24 DIAGNOSIS — H2513 Age-related nuclear cataract, bilateral: Secondary | ICD-10-CM | POA: Diagnosis not present

## 2017-10-24 DIAGNOSIS — H353122 Nonexudative age-related macular degeneration, left eye, intermediate dry stage: Secondary | ICD-10-CM | POA: Diagnosis not present

## 2017-10-24 DIAGNOSIS — D3132 Benign neoplasm of left choroid: Secondary | ICD-10-CM

## 2017-10-24 DIAGNOSIS — H353211 Exudative age-related macular degeneration, right eye, with active choroidal neovascularization: Secondary | ICD-10-CM | POA: Diagnosis not present

## 2017-10-24 DIAGNOSIS — H43813 Vitreous degeneration, bilateral: Secondary | ICD-10-CM | POA: Diagnosis not present

## 2017-10-24 DIAGNOSIS — I10 Essential (primary) hypertension: Secondary | ICD-10-CM | POA: Diagnosis not present

## 2017-11-13 DIAGNOSIS — H903 Sensorineural hearing loss, bilateral: Secondary | ICD-10-CM | POA: Diagnosis not present

## 2017-11-13 DIAGNOSIS — H6506 Acute serous otitis media, recurrent, bilateral: Secondary | ICD-10-CM | POA: Diagnosis not present

## 2017-11-13 DIAGNOSIS — H60321 Hemorrhagic otitis externa, right ear: Secondary | ICD-10-CM | POA: Diagnosis not present

## 2017-11-13 DIAGNOSIS — H73011 Bullous myringitis, right ear: Secondary | ICD-10-CM | POA: Diagnosis not present

## 2017-11-13 DIAGNOSIS — H60313 Diffuse otitis externa, bilateral: Secondary | ICD-10-CM | POA: Diagnosis not present

## 2017-11-21 DIAGNOSIS — N3020 Other chronic cystitis without hematuria: Secondary | ICD-10-CM | POA: Diagnosis not present

## 2017-11-21 DIAGNOSIS — N312 Flaccid neuropathic bladder, not elsewhere classified: Secondary | ICD-10-CM | POA: Diagnosis not present

## 2017-11-26 ENCOUNTER — Non-Acute Institutional Stay: Payer: Medicare Other | Admitting: Internal Medicine

## 2017-11-26 ENCOUNTER — Encounter: Payer: Self-pay | Admitting: Internal Medicine

## 2017-11-26 VITALS — BP 120/70 | HR 66 | Temp 98.1°F | Ht <= 58 in | Wt 107.0 lb

## 2017-11-26 DIAGNOSIS — R195 Other fecal abnormalities: Secondary | ICD-10-CM

## 2017-11-26 DIAGNOSIS — F0390 Unspecified dementia without behavioral disturbance: Secondary | ICD-10-CM

## 2017-11-26 DIAGNOSIS — H60393 Other infective otitis externa, bilateral: Secondary | ICD-10-CM | POA: Diagnosis not present

## 2017-11-26 DIAGNOSIS — Z85038 Personal history of other malignant neoplasm of large intestine: Secondary | ICD-10-CM | POA: Diagnosis not present

## 2017-11-26 DIAGNOSIS — R339 Retention of urine, unspecified: Secondary | ICD-10-CM

## 2017-11-26 DIAGNOSIS — R0602 Shortness of breath: Secondary | ICD-10-CM

## 2017-11-26 NOTE — Progress Notes (Signed)
Location:  Occupational psychologist of Service:  Clinic (12)  Provider: Creed Kail L. Mariea Clonts, D.O., C.M.D.  Code Status: DNR Goals of Care:  Advanced Directives 11/26/2017  Does Patient Have a Medical Advance Directive? Yes  Type of Paramedic of [place];Living will;Out of facility DNR (pink MOST or yellow form)  Does patient want to make changes to medical advance directive? No - Patient declined  Copy of [place] in Chart? Yes  Pre-existing out of facility DNR order (yellow form or pink MOST form) Yellow form placed in chart (order not valid for inpatient use);Pink MOST form placed in chart (order not valid for inpatient use)   Chief Complaint  Patient presents with  . Medical Management of Chronic Issues    42mth follow-up    HPI: Patient is a 84 y.o. female seen today for medical management of chronic diseases.    Had ear infection and had antibiotics for it last week.  Her son is visiting from [place].  Her hearing is far worse since this.  She still has blood and drainage in her ear canals on exam.    She was haivng loose bowels after every meal.  Had tried to go w/o milk which helped.  She also has been better the past few days.  She is on abx for the ear infection which isn't helping.    Reviewed with her son that we meet regularly for changes in her bowels to either constipation or diarrhea and she is fearful she has recurrent rectal cancer, but wouldn't do anything about it if she did.    Past Medical History:  Diagnosis Date  . Anemia, unspecified   . Asymptomatic varicose veins   . Carotid bruit    hx of  . Carotid bruit    doppler normal in the past  . Diverticulosis   . Dyslipidemia   . Esophageal reflux   . Female bladder prolapse, acquired 07/17/2010  . Fibroid   . Full incontinence of feces   . Hemorrhoid    1963 surgical excision  . History of colon cancer    Adenocarcinoma of right colon, stage T2,  N1.s/p resection/chemotherapy 2002  . HTN (hypertension)   . Hx of colonic polyps    1965 surgical excision  . Hypothyroidism   . Idiopathic osteoporosis   . Idiopathic osteoporosis   . Internal hemorrhoids without mention of complication    bleed easily  . Irritable bowel syndrome   . Melanoma of skin, site unspecified   . MVP (mitral valve prolapse)   . OA (osteoarthritis)    left hip  . Osteoarthrosis, unspecified whether generalized or localized, pelvic region and thigh   . Peripheral neuropathy    hands/ feet  . 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 Spinal stenosis, unspecified region other than cervical   . Vaginal bleeding, abnormal   . Vaginitis, atrophic   . Varicose veins     Past Surgical History:  Procedure Laterality Date  . COLONOSCOPY W/ POLYPECTOMY  2006   3 mm polyp destroyed, diverticulosis  . DILATION AND CURETTAGE OF UTERUS    . ESOPHAGOGASTRODUODENOSCOPY  2009   mild gastritis  . HEMICOLECTOMY  2002   right  . [place]  . HYSTEROSCOPY    . MELANOMA EXCISION      Allergies  Allergen Reactions  . Amoxil [Amoxicillin] Diarrhea    Has patient had a PCN reaction causing immediate rash, facial/tongue/throat swelling,  SOB or lightheadedness with hypotension: no Has patient had a PCN reaction causing severe rash involving mucus membranes or skin necrosis: no Has patient had a PCN reaction that required hospitalization : no Has patient had a PCN reaction occurring within the last 10 years: no If all of the above answers are "NO", then may proceed with Cephalosporin use.   Otho Darner Allergy]     sensitivity  . Demerol     unknown  . Meperidine Hcl Other (See Comments)    Drop in blood pressure  . Infed [Iron Dextran] Rash    Became very red over face, scalp, and arms  . Neosporin [Neomycin-Bacitracin Zn-Polymyx] Rash    unknown    Outpatient Encounter Medications as of 11/26/2017  Medication Sig  . acetaminophen (TYLENOL)  650 MG CR tablet Take 650 mg by mouth 2 (two) times daily.  Marland Kitchen antiseptic oral rinse (BIOTENE) LIQD 15 mLs by Mouth Rinse route 3 (three) times daily.  . Artificial Tear Ointment (ARTIFICIAL TEARS) ointment Place 2 drops into the left eye 2 (two) times daily.  . cholecalciferol (VITAMIN D) 1000 UNITS tablet Take 1 tablet (1,000 Units total) by mouth daily.  . Cranberry (ELLURA PO) Take 1 capsule by mouth daily.  . Eyelid Cleansers (OCUSOFT EYELID CLEANSING) PADS Apply topically as needed.  . hydrocortisone (ANUSOL-HC) 2.5 % rectal cream Place 1 application rectally daily as needed for hemorrhoids or itching.   . levothyroxine (SYNTHROID, LEVOTHROID) 75 MCG tablet TAKE 1 TABLET DAILY BEFORE BREAKFAST FOR THYROID.  . Melatonin 5 MG TABS Take by mouth at bedtime.  . methenamine (HIPREX) 1 g tablet Take 1 g by mouth at bedtime.  . methylcellulose (CITRUCEL FIBERSHAKE) oral powder Take 1 packet by mouth every other day.   . nitrofurantoin (MACRODANTIN) 100 MG capsule Take 100 mg by mouth 2 (two) times daily.  . phenazopyridine (PYRIDIUM) 200 MG tablet Take 200 mg by mouth 3 (three) times daily as needed for pain.  Vladimir Faster Glycol-Propyl Glycol (SYSTANE) 0.4-0.3 % SOLN Place 2 drops into the right eye 2 (two) times daily.  . Potassium Chloride ER 20 MEQ TBCR Take 20 mEq by mouth 2 (two) times daily.  . Probiotic Product (ALIGN) 4 MG CAPS Take 1 tablet by mouth daily.    Marland Kitchen saccharomyces boulardii (FLORASTOR) 250 MG capsule Take 250 mg by mouth daily.  . sodium chloride (OCEAN) 0.65 % SOLN nasal spray Place 1 spray into both nostrils as needed for congestion.  . vitamin C (ASCORBIC ACID) 500 MG tablet Take 500 mg by mouth daily.   No facility-administered encounter medications on file as of 11/26/2017.     Review of Systems:  Review of Systems  Constitutional: Positive for malaise/fatigue. Negative for chills and fever.  HENT: Positive for ear discharge, ear pain and hearing loss. Negative for  sore throat and tinnitus.   Eyes: Positive for blurred vision.  Respiratory: Positive for shortness of breath.        With exertion due to deconditiong  Cardiovascular: Negative for chest pain, palpitations and leg swelling.  Gastrointestinal: Positive for constipation and diarrhea. Negative for abdominal pain, blood in stool and melena.  Genitourinary: Positive for dysuria, frequency and urgency.       Chronic incontinence and overactive bladder, some functional now as well  Musculoskeletal: Negative for falls.  Neurological: Negative for dizziness and loss of consciousness.       Foot drop with AFO  Endo/Heme/Allergies: Bruises/bleeds easily.  Psychiatric/Behavioral: Positive for memory  loss. Negative for depression. The patient is nervous/anxious and has insomnia.     Health Maintenance  Topic Date Due  . INFLUENZA VACCINE  01/15/2018  . TETANUS/TDAP  10/08/2021  . DEXA SCAN  Completed  . PNA vac Low Risk Adult  Completed    Physical Exam: Vitals:   11/26/17 1057  BP: 120/70  Pulse: 66  Temp: 98.1 F (36.7 C)  TempSrc: Oral  SpO2: 98%  Weight: 107 lb (48.5 kg)  Height: 4\' 8"  (1.422 m)   Body mass index is 23.99 kg/m. Physical Exam  Constitutional:  Thin frail female  HENT:  Head: Normocephalic and atraumatic.  Bilateral ears with bloody discharge  Eyes: Pupils are equal, round, and reactive to light. EOM are normal.  Cardiovascular: Normal rate, regular rhythm, normal heart sounds and intact distal pulses.  Pulmonary/Chest: Effort normal and breath sounds normal. No respiratory distress.  Abdominal: Bowel sounds are normal.  Musculoskeletal: Normal range of motion.  Neurological: She is alert.  Confused, but does properly relay some of the information  Skin: Skin is warm and dry. Capillary refill takes less than 2 seconds.  Psychiatric: She has a normal mood and affect.    Labs reviewed: Basic Metabolic Panel: Recent Labs    01/30/17 0700 07/22/17  09/18/17 1211  NA 133* 134* 133*  K 4.3 4.6 5.1  CL  --   --  96*  CO2  --   --  30  GLUCOSE  --   --  78  BUN 17 11 15   CREATININE 0.5 0.5 0.54*  CALCIUM  --   --  9.5  TSH 2.38  --   --    Liver Function Tests: Recent Labs    12/26/16 0700 01/30/17 0700 09/18/17 1211  AST 29 30 21   ALT 14 17 9   ALKPHOS 96 125  --   BILITOT  --   --  0.5  PROT  --   --  7.3   No results for input(s): LIPASE, AMYLASE in the last 8760 hours. No results for input(s): AMMONIA in the last 8760 hours. CBC: Recent Labs    01/30/17 0700 07/22/17 09/18/17 1211  WBC 7.0 6.4 6.7  NEUTROABS  --   --  4,710  HGB 11.8* 12.3 12.6  HCT 35* 37 36.1  MCV  --   --  89.4  PLT 283 268 253   Lipid Panel: No results for input(s): CHOL, HDL, LDLCALC, TRIG, CHOLHDL, LDLDIRECT in the last 8760 hours. Lab Results  Component Value Date   HGBA1C 5.9 12/02/2006    Assessment/Plan 1. History of colon cancer in adulthood No evidence of recurrence, has hemorrhoidal bleeding often Won't do anything about it if she had recurrence anyway which she's very clear about  2. Urinary retention Ongoing issue with frequent trips to restroom at night, refuses indwelling cath  3. Other infective acute otitis externa of both ears Complete abx as planned and f/u with ENT  4. Dementia without behavioral disturbance, unspecified dementia type Progressive, does not always recall previous advice on managing her symptoms  5.  Loose stools Lactose free diet and avoidance of items per dietitian  6.  Shortness of breath Incentive spirometer right lung base  Labs/tests ordered:  No orders of the defined types were placed in this encounter.   Next appt:  4 mos med mgt  Samaj Wessells L. Ramonia Mcclaran, D.O. [facility] [address], [place] [address] Cell Phone (Mon-Fri  8am-5pm):  [phone] On Call:  [phone] & follow prompts after 5pm & weekends Office Phone:   [phone] Office Fax:  [phone]

## 2017-11-27 DIAGNOSIS — H903 Sensorineural hearing loss, bilateral: Secondary | ICD-10-CM | POA: Diagnosis not present

## 2018-01-01 ENCOUNTER — Encounter (INDEPENDENT_AMBULATORY_CARE_PROVIDER_SITE_OTHER): Payer: Self-pay | Admitting: Ophthalmology

## 2018-01-02 ENCOUNTER — Encounter (INDEPENDENT_AMBULATORY_CARE_PROVIDER_SITE_OTHER): Payer: Self-pay | Admitting: Ophthalmology

## 2018-01-05 ENCOUNTER — Encounter (INDEPENDENT_AMBULATORY_CARE_PROVIDER_SITE_OTHER): Payer: Medicare Other | Admitting: Ophthalmology

## 2018-01-05 DIAGNOSIS — I10 Essential (primary) hypertension: Secondary | ICD-10-CM

## 2018-01-05 DIAGNOSIS — H353211 Exudative age-related macular degeneration, right eye, with active choroidal neovascularization: Secondary | ICD-10-CM

## 2018-01-05 DIAGNOSIS — H43813 Vitreous degeneration, bilateral: Secondary | ICD-10-CM

## 2018-01-05 DIAGNOSIS — H35033 Hypertensive retinopathy, bilateral: Secondary | ICD-10-CM

## 2018-01-05 DIAGNOSIS — D3132 Benign neoplasm of left choroid: Secondary | ICD-10-CM | POA: Diagnosis not present

## 2018-01-05 DIAGNOSIS — H353122 Nonexudative age-related macular degeneration, left eye, intermediate dry stage: Secondary | ICD-10-CM | POA: Diagnosis not present

## 2018-01-06 ENCOUNTER — Non-Acute Institutional Stay: Payer: Medicare Other

## 2018-01-06 DIAGNOSIS — Z0000 Encounter for general adult medical examination without abnormal findings: Secondary | ICD-10-CM | POA: Diagnosis not present

## 2018-01-06 NOTE — Patient Instructions (Addendum)
[name] Small , Thank you for taking time to come for your Medicare Wellness Visit. I appreciate your ongoing commitment to your health goals. Please review the following plan we discussed and let me know if I can assist you in the future.   Screening recommendations/referrals: Colonoscopy excluded, over age 82 Mammogram excluded, over age 53 Bone Density up to date Recommended yearly ophthalmology/optometry visit for glaucoma screening and checkup Recommended yearly dental visit for hygiene and checkup  Vaccinations: Influenza vaccine up to date, due 2019 fall season Pneumococcal vaccine up to date, completed Tdap vaccine up to date, due 10/08/2021 Shingles vaccine waiting for second shot    Advanced directives: in chart  Conditions/risks identified: none  Next appointment: Dr. Mariea Clonts 03/25/2018 @ 11:30am   Preventive Care 28 Years and Older, Female Preventive care refers to lifestyle choices and visits with your health care provider that can promote health and wellness. What does preventive care include?  A yearly physical exam. This is also called an annual well check.  Dental exams once or twice a year.  Routine eye exams. Ask your health care provider how often you should have your eyes checked.  Personal lifestyle choices, including:  Daily care of your teeth and gums.  Regular physical activity.  Eating a healthy diet.  Avoiding tobacco and drug use.  Limiting alcohol use.  Practicing safe sex.  Taking low-dose aspirin every day.  Taking vitamin and mineral supplements as recommended by your health care provider. What happens during an annual well check? The services and screenings done by your health care provider during your annual well check will depend on your age, overall health, lifestyle risk factors, and family history of disease. Counseling  Your health care provider may ask you questions about your:  Alcohol use.  Tobacco use.  Drug  use.  Emotional well-being.  Home and relationship well-being.  Sexual activity.  Eating habits.  History of falls.  Memory and ability to understand (cognition).  Work and work Statistician.  Reproductive health. Screening  You may have the following tests or measurements:  Height, weight, and BMI.  Blood pressure.  Lipid and cholesterol levels. These may be checked every 5 years, or more frequently if you are over 66 years old.  Skin check.  Lung cancer screening. You may have this screening every year starting at age 73 if you have a 30-pack-year history of smoking and currently smoke or have quit within the past 15 years.  Fecal occult blood test (FOBT) of the stool. You may have this test every year starting at age 25.  Flexible sigmoidoscopy or colonoscopy. You may have a sigmoidoscopy every 5 years or a colonoscopy every 10 years starting at age 10.  Hepatitis C blood test.  Hepatitis B blood test.  Sexually transmitted disease (STD) testing.  Diabetes screening. This is done by checking your blood sugar (glucose) after you have not eaten for a while (fasting). You may have this done every 1-3 years.  Bone density scan. This is done to screen for osteoporosis. You may have this done starting at age 7.  Mammogram. This may be done every 1-2 years. Talk to your health care provider about how often you should have regular mammograms. Talk with your health care provider about your test results, treatment options, and if necessary, the need for more tests. Vaccines  Your health care provider may recommend certain vaccines, such as:  Influenza vaccine. This is recommended every year.  Tetanus, diphtheria, and acellular pertussis (  Tdap, Td) vaccine. You may need a Td booster every 10 years.  Zoster vaccine. You may need this after age 42.  Pneumococcal 13-valent conjugate (PCV13) vaccine. One dose is recommended after age 33.  Pneumococcal polysaccharide  (PPSV23) vaccine. One dose is recommended after age 34. Talk to your health care provider about which screenings and vaccines you need and how often you need them. This information is not intended to replace advice given to you by your health care provider. Make sure you discuss any questions you have with your health care provider. Document Released: 06/30/2015 Document Revised: 02/21/2016 Document Reviewed: 04/04/2015 Elsevier Interactive Patient Education  2017 [place] Prevention in the Home Falls can cause injuries. They can happen to people of all ages. There are many things you can do to make your home safe and to help prevent falls. What can I do on the outside of my home?  Regularly fix the edges of walkways and driveways and fix any cracks.  Remove anything that might make you trip as you walk through a door, such as a raised step or threshold.  Trim any bushes or trees on the path to your home.  Use bright outdoor lighting.  Clear any walking paths of anything that might make someone trip, such as rocks or tools.  Regularly check to see if handrails are loose or broken. Make sure that both sides of any steps have handrails.  Any raised decks and porches should have guardrails on the edges.  Have any leaves, snow, or ice cleared regularly.  Use sand or salt on walking paths during winter.  Clean up any spills in your garage right away. This includes oil or grease spills. What can I do in the bathroom?  Use night lights.  Install grab bars by the toilet and in the tub and shower. Do not use towel bars as grab bars.  Use non-skid mats or decals in the tub or shower.  If you need to sit down in the shower, use a plastic, non-slip stool.  Keep the floor dry. Clean up any water that spills on the floor as soon as it happens.  Remove soap buildup in the tub or shower regularly.  Attach bath mats securely with double-sided non-slip rug tape.  Do not have  throw rugs and other things on the floor that can make you trip. What can I do in the bedroom?  Use night lights.  Make sure that you have a light by your bed that is easy to reach.  Do not use any sheets or blankets that are too big for your bed. They should not hang down onto the floor.  Have a firm chair that has side arms. You can use this for support while you get dressed.  Do not have throw rugs and other things on the floor that can make you trip. What can I do in the kitchen?  Clean up any spills right away.  Avoid walking on wet floors.  Keep items that you use a lot in easy-to-reach places.  If you need to reach something above you, use a strong step stool that has a grab bar.  Keep electrical cords out of the way.  Do not use floor polish or wax that makes floors slippery. If you must use wax, use non-skid floor wax.  Do not have throw rugs and other things on the floor that can make you trip. What can I do with my stairs?  Do  not leave any items on the stairs.  Make sure that there are handrails on both sides of the stairs and use them. Fix handrails that are broken or loose. Make sure that handrails are as long as the stairways.  Check any carpeting to make sure that it is firmly attached to the stairs. Fix any carpet that is loose or worn.  Avoid having throw rugs at the top or bottom of the stairs. If you do have throw rugs, attach them to the floor with carpet tape.  Make sure that you have a light switch at the top of the stairs and the bottom of the stairs. If you do not have them, ask someone to add them for you. What else can I do to help prevent falls?  Wear shoes that:  Do not have high heels.  Have rubber bottoms.  Are comfortable and fit you well.  Are closed at the toe. Do not wear sandals.  If you use a stepladder:  Make sure that it is fully opened. Do not climb a closed stepladder.  Make sure that both sides of the stepladder are  locked into place.  Ask someone to hold it for you, if possible.  Clearly mark and make sure that you can see:  Any grab bars or handrails.  First and last steps.  Where the edge of each step is.  Use tools that help you move around (mobility aids) if they are needed. These include:  Canes.  Walkers.  Scooters.  Crutches.  Turn on the lights when you go into a dark area. Replace any light bulbs as soon as they burn out.  Set up your furniture so you have a clear path. Avoid moving your furniture around.  If any of your floors are uneven, fix them.  If there are any pets around you, be aware of where they are.  Review your medicines with your doctor. Some medicines can make you feel dizzy. This can increase your chance of falling. Ask your doctor what other things that you can do to help prevent falls. This information is not intended to replace advice given to you by your health care provider. Make sure you discuss any questions you have with your health care provider. Document Released: 03/30/2009 Document Revised: 11/09/2015 Document Reviewed: 07/08/2014 Elsevier Interactive Patient Education  2017 [employer].

## 2018-01-06 NOTE — Progress Notes (Signed)
Subjective:   [name] Small is a 84 y.o. female who presents for Medicare Annual (Subsequent) preventive examination at [facility]  Last AWV-12/31/2016    Objective:     Vitals: BP 118/60 (BP Location: Right Arm, Patient Position: Sitting)   Pulse 69   Temp 98 F (36.7 C) (Oral)   Ht 4\' 8"  (1.422 m)   Wt 107 lb (48.5 kg)   SpO2 95%   BMI 23.99 kg/m   Body mass index is 23.99 kg/m.  Advanced Directives 01/06/2018 11/26/2017 07/23/2017 03/26/2017 01/29/2017 12/31/2016 12/25/2016  Does Patient Have a Medical Advance Directive? Yes Yes Yes Yes Yes Yes Yes  Type of Paramedic of [place];Living will;Out of facility DNR (pink MOST or yellow form) [place];Living will;Out of facility DNR (pink MOST or yellow form) [place];Living will;Out of facility DNR (pink MOST or yellow form) Out of facility DNR (pink MOST or yellow form);[place];Living will Out of facility DNR (pink MOST or yellow form);[place];Living will [place];Living will;Out of facility DNR (pink MOST or yellow form) Out of facility DNR (pink MOST or yellow form);[place];Living will  Does patient want to make changes to medical advance directive? No - Patient declined No - Patient declined No - Patient declined - - No - Patient declined -  Copy of [place] in Chart? Yes Yes Yes Yes Yes Yes Yes  Pre-existing out of facility DNR order (yellow form or pink MOST form) Yellow form placed in chart (order not valid for inpatient use);Pink MOST form placed in chart (order not valid for inpatient use) Yellow form placed in chart (order not valid for inpatient use);Pink MOST form placed in chart (order not valid for inpatient use) Yellow form placed in chart (order not valid for inpatient use);Pink MOST form placed in chart (order not valid for inpatient  use) Yellow form placed in chart (order not valid for inpatient use);Pink MOST form placed in chart (order not valid for inpatient use) Yellow form placed in chart (order not valid for inpatient use);Pink MOST form placed in chart (order not valid for inpatient use) Yellow form placed in chart (order not valid for inpatient use);Pink MOST form placed in chart (order not valid for inpatient use) Yellow form placed in chart (order not valid for inpatient use);Pink MOST form placed in chart (order not valid for inpatient use)    Tobacco Social History   Tobacco Use  Smoking Status Former Smoker  . Years: 2.00  . Types: Cigarettes  . Last attempt to quit: 09/09/1943  . Years since quitting: 74.3  Smokeless Tobacco Never Used     Counseling given: Not Answered   Clinical Intake:  Pre-visit preparation completed: No  Pain : No/denies pain     Nutritional Risks: None Diabetes: No  How often do you need to have someone help you when you read instructions, pamphlets, or other written materials from your doctor or pharmacy?: 1 - Never What is the last grade level you completed in school?: some college     Information entered by :: Tyson Dense, RN  Past Medical History:  Diagnosis Date  . Anemia, unspecified   . Asymptomatic varicose veins   . Carotid bruit    hx of  . Carotid bruit    doppler normal in the past  . Diverticulosis   . Dyslipidemia   . Esophageal reflux   .  Female bladder prolapse, acquired 07/17/2010  . Fibroid   . Full incontinence of feces   . Hemorrhoid    1963 surgical excision  . History of colon cancer    Adenocarcinoma of right colon, stage T2, N1.s/p resection/chemotherapy 2002  . HTN (hypertension)   . Hx of colonic polyps    1965 surgical excision  . Hypothyroidism   . Idiopathic osteoporosis   . Idiopathic osteoporosis   . Internal hemorrhoids without mention of complication    bleed easily  . Irritable bowel syndrome   . Melanoma of skin,  site unspecified   . MVP (mitral valve prolapse)   . OA (osteoarthritis)    left hip  . Osteoarthrosis, unspecified whether generalized or localized, pelvic region and thigh   . Peripheral neuropathy    hands/ feet  . 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 Spinal stenosis, unspecified region other than cervical   . Vaginal bleeding, abnormal   . Vaginitis, atrophic   . Varicose veins    Past Surgical History:  Procedure Laterality Date  . COLONOSCOPY W/ POLYPECTOMY  2006   3 mm polyp destroyed, diverticulosis  . DILATION AND CURETTAGE OF UTERUS    . ESOPHAGOGASTRODUODENOSCOPY  2009   mild gastritis  . HEMICOLECTOMY  2002   right  . [place]  . HYSTEROSCOPY    . MELANOMA EXCISION     Family History  Problem Relation Age of Onset  . Heart disease Father   . Diabetes Father   . Heart disease Sister   . Stomach cancer Maternal Grandfather   . Colon cancer Neg Hx    Social History   Socioeconomic History  . Marital status: Widowed    Spouse name: Not on file  . Number of children: 2  . Years of education: Not on file  . Highest education level: Not on file  Occupational History  . Occupation: retired    Fish farm manager: RETIRED  Social Needs  . Financial resource strain: Not hard at all  . Food insecurity:    Worry: Never true    Inability: Never true  . Transportation needs:    Medical: No    Non-medical: No  Tobacco Use  . Smoking status: Former Smoker    Years: 2.00    Types: Cigarettes    Last attempt to quit: 09/09/1943    Years since quitting: 74.3  . Smokeless tobacco: Never Used  Substance and Sexual Activity  . Alcohol use: Yes    Alcohol/week: 0.0 oz    Comment: occas wine 2 glasses a week  . Drug use: No  . Sexual activity: Never  Lifestyle  . Physical activity:    Days per week: 4 days    Minutes per session: 30 min  . Stress: Only a little  Relationships  . Social connections:    Talks on phone: More than three times a week     Gets together: More than three times a week    Attends religious service: More than 4 times per year    Active member of club or organization: No    Attends meetings of clubs or organizations: Never    Relationship status: Widowed  Other Topics Concern  . Not on file  Social History Narrative   widowed after a very long and happy marriage almost 72 years. 2 sons - one a Advice worker, several grandchildren. Close and attentive family. Lives alone at [place], since 7/ 2004 : paricipates in yoga and  exercise. I-ADLs including driving. Supportive family who checks on her and visits her regularly.   Advanced Care planning documents: DNR, MOST form, Living Will   Walks with walker   Exercise: M-W-F class         Outpatient Encounter Medications as of 01/06/2018  Medication Sig  . acetaminophen (TYLENOL) 650 MG CR tablet Take 650 mg by mouth 2 (two) times daily.  Marland Kitchen antiseptic oral rinse (BIOTENE) LIQD 15 mLs by Mouth Rinse route 3 (three) times daily.  . Artificial Tear Ointment (ARTIFICIAL TEARS) ointment Place 2 drops into the left eye 2 (two) times daily.  . cholecalciferol (VITAMIN D) 1000 UNITS tablet Take 1 tablet (1,000 Units total) by mouth daily.  . Cranberry (ELLURA PO) Take 1 capsule by mouth daily.  . Eyelid Cleansers (OCUSOFT EYELID CLEANSING) PADS Apply topically as needed.  . hydrocortisone (ANUSOL-HC) 2.5 % rectal cream Place 1 application rectally daily as needed for hemorrhoids or itching.   . levothyroxine (SYNTHROID, LEVOTHROID) 75 MCG tablet TAKE 1 TABLET DAILY BEFORE BREAKFAST FOR THYROID.  . Melatonin 5 MG TABS Take by mouth at bedtime.  . methenamine (HIPREX) 1 g tablet Take 1 g by mouth at bedtime.  . methylcellulose (CITRUCEL FIBERSHAKE) oral powder Take 1 packet by mouth every other day.   . nitrofurantoin (MACRODANTIN) 100 MG capsule Take 100 mg by mouth 2 (two) times daily.  . phenazopyridine (PYRIDIUM) 200 MG tablet Take 200 mg by mouth 3 (three) times  daily as needed for pain.  Vladimir Faster Glycol-Propyl Glycol (SYSTANE) 0.4-0.3 % SOLN Place 2 drops into the right eye 2 (two) times daily.  . Potassium Chloride ER 20 MEQ TBCR Take 20 mEq by mouth 2 (two) times daily.  . Probiotic Product (ALIGN) 4 MG CAPS Take 1 tablet by mouth daily.    Marland Kitchen saccharomyces boulardii (FLORASTOR) 250 MG capsule Take 250 mg by mouth daily.  . sodium chloride (OCEAN) 0.65 % SOLN nasal spray Place 1 spray into both nostrils as needed for congestion.  . vitamin C (ASCORBIC ACID) 500 MG tablet Take 500 mg by mouth daily.   No facility-administered encounter medications on file as of 01/06/2018.     Activities of Daily Living In your present state of health, do you have any difficulty performing the following activities: 01/06/2018  Hearing? Y  Vision? N  Difficulty concentrating or making decisions? N  Walking or climbing stairs? Y  Dressing or bathing? Y  Doing errands, shopping? Y  Preparing Food and eating ? Y  Using the Toilet? N  In the past six months, have you accidently leaked urine? Y  Do you have problems with loss of bowel control? N  Managing your Medications? Y  Managing your Finances? Y  Housekeeping or managing your Housekeeping? Y  Some recent data might be hidden    Patient Care Team: Gayland Curry, DO as PCP - General (Geriatric Medicine) Terrance Mass, MD as Consulting Physician (Gynecology) Shon Hough, MD as Consulting Physician (Ophthalmology) Carolan Clines, MD as Consulting Physician (Urology) Gatha Mayer, MD as Consulting Physician (Gastroenterology) Wyatt Portela, MD as Consulting Physician (Oncology)    Assessment:   This is a routine wellness examination for [place].  Exercise Activities and Dietary recommendations Current Exercise Habits: Structured exercise class, Type of exercise: Other - see comments(chair fit), Time (Minutes): 30, Frequency (Times/Week): 4, Weekly Exercise (Minutes/Week): 120,  Intensity: Mild, Exercise limited by: orthopedic condition(s)  Goals    None  Fall Risk Fall Risk  01/06/2018 11/26/2017 09/18/2017 01/29/2017 12/31/2016  Falls in the past year? Yes No No Yes Yes  Number falls in past yr: 1 - - 1 2 or more  Injury with Fall? No - - No Yes   Is the patient's home free of loose throw rugs in walkways, pet beds, electrical cords, etc?   yes      Grab bars in the bathroom? yes      Handrails on the stairs?   yes      Adequate lighting?   yes  Timed Get Up and Go performed: 24 seconds  Depression Screen PHQ 2/9 Scores 01/06/2018 11/26/2017 01/29/2017 12/31/2016  PHQ - 2 Score 0 0 0 0     Cognitive Function MMSE - Mini Mental State Exam 11/29/2017 10/30/2016  Orientation to time 5 4  Orientation to Place 5 5  Registration 3 3  Attention/ Calculation 4 2  Recall 0 3  Language- name 2 objects 2 2  Language- repeat 1 1  Language- follow 3 step command 3 3  Language- read & follow direction 1 1  Write a sentence 1 1  Copy design 1 1  Total score 26 26        Immunization History  Administered Date(s) Administered  . H1N1 05/30/2008  . Influenza Whole 03/17/2000, 03/05/2010, 03/17/2012  . Influenza,inj,Quad PF,6+ Mos 03/14/2015  . Influenza-Unspecified 04/16/2013, 03/31/2014, 04/11/2016, 04/10/2017  . Pneumococcal Conjugate-13 09/08/2015  . Pneumococcal Polysaccharide-23 03/17/1997  . Tdap 10/09/2011  . Zoster Recombinat (Shingrix) 07/13/2017    Qualifies for Shingles Vaccine? Waiting for second shot  Screening Tests Health Maintenance  Topic Date Due  . INFLUENZA VACCINE  01/15/2018  . TETANUS/TDAP  10/08/2021  . DEXA SCAN  Completed  . PNA vac Low Risk Adult  Completed    Cancer Screenings: Lung: Low Dose CT Chest recommended if Age 83-80 years, 30 pack-year currently smoking OR have quit w/in 15years. Patient does not qualify. Breast:  Up to date on Mammogram? Yes   Up to date of Bone Density/Dexa? Yes Colorectal: up to  date  Additional Screenings:  Hepatitis C Screening: declined     Plan:    I have personally reviewed and addressed the Medicare Annual Wellness questionnaire and have noted the following in the patient's chart:  A. Medical and social history B. Use of alcohol, tobacco or illicit drugs  C. Current medications and supplements D. Functional ability and status E.  Nutritional status F.  Physical activity G. Advance directives H. List of other physicians I.  Hospitalizations, surgeries, and ER visits in previous 12 months J.  [place] to include hearing, vision, cognitive, depression L. Referrals and appointments - none  In addition, I have reviewed and discussed with patient certain preventive protocols, quality metrics, and best practice recommendations. A written personalized care plan for preventive services as well as general preventive health recommendations were provided to patient.  See attached scanned questionnaire for additional information.   Signed,   Tyson Dense, RN Nurse Health Advisor  Patient Concerns: Not sleeping well at night

## 2018-01-23 ENCOUNTER — Encounter: Payer: Self-pay | Admitting: Adult Health

## 2018-01-23 ENCOUNTER — Non-Acute Institutional Stay: Payer: Medicare Other | Admitting: Adult Health

## 2018-01-23 DIAGNOSIS — B373 Candidiasis of vulva and vagina: Secondary | ICD-10-CM | POA: Diagnosis not present

## 2018-01-23 DIAGNOSIS — B3731 Acute candidiasis of vulva and vagina: Secondary | ICD-10-CM

## 2018-01-23 NOTE — Progress Notes (Signed)
Location:  Occupational psychologist of Service:  ALF (13) Provider:   Cindi Carbon, ANP [place] [phone]   Gayland Curry, DO  Patient Care Team: Gayland Curry, DO as PCP - General (Geriatric Medicine) Terrance Mass, MD as Consulting Physician (Gynecology) Shon Hough, MD as Consulting Physician (Ophthalmology) Carolan Clines, MD as Consulting Physician (Urology) Gatha Mayer, MD as Consulting Physician (Gastroenterology) Wyatt Portela, MD as Consulting Physician (Oncology)  Extended Emergency Contact Information Primary Emergency Contact: Fox,Richard (Dr) Address: [address]          [place], [place] Phone: [phone] Work Phone: [phone] Relation: Alanson Puls Secondary Emergency Contact: Claudell Kyle Address: [place] [address] [place] Litter of [place] Mobile Phone: [phone] Relation: Son  Code Status:  DNR Goals of care: Advanced Directive information Advanced Directives 01/06/2018  Does Patient Have a Medical Advance Directive? Yes  Type of Paramedic of [place];Living will;Out of facility DNR (pink MOST or yellow form)  Does patient want to make changes to medical advance directive? No - Patient declined  Copy of [place] in Chart? Yes  Pre-existing out of facility DNR order (yellow form or pink MOST form) Yellow form placed in chart (order not valid for inpatient use);Pink MOST form placed in chart (order not valid for inpatient use)     Chief Complaint  Patient presents with  . Acute Visit    erythema to vaginal area    HPI:  Pt is a 84 y.o. female seen today for an acute visit for vaginal erythema. She has a hx of urinary retention and is catheterized three times a day. She received an antibiotic in June for a UTI and developed symptoms of vaginal redness and irritation at the end of July.  She has tried Diflucan as well as nystatin cream with minimal improvement. The staff uses powder and endit cream as well. She denies any dysuria, pain, fever, drainage, etc.    Past Medical History:  Diagnosis Date  . Anemia, unspecified   . Asymptomatic varicose veins   . Carotid bruit    hx of  . Carotid bruit    doppler normal in the past  . Diverticulosis   . Dyslipidemia   . Esophageal reflux   . Female bladder prolapse, acquired 07/17/2010  . Fibroid   . Full incontinence of feces   . Hemorrhoid    1963 surgical excision  . History of colon cancer    Adenocarcinoma of right colon, stage T2, N1.s/p resection/chemotherapy 2002  . HTN (hypertension)   . Hx of colonic polyps    1965 surgical excision  . Hypothyroidism   . Idiopathic osteoporosis   . Idiopathic osteoporosis   . Internal hemorrhoids without mention of complication    bleed easily  . Irritable bowel syndrome   . Melanoma of skin, site unspecified   . MVP (mitral valve prolapse)   . OA (osteoarthritis)    left hip  . Osteoarthrosis, unspecified whether generalized or localized, pelvic region and thigh   . Peripheral neuropathy    hands/ feet  . 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 Spinal stenosis, unspecified region other than cervical   . Vaginal bleeding, abnormal   . Vaginitis, atrophic   . Varicose veins    Past Surgical History:  Procedure Laterality Date  . COLONOSCOPY W/ POLYPECTOMY  2006   3 mm polyp destroyed, diverticulosis  . DILATION AND CURETTAGE OF UTERUS    . ESOPHAGOGASTRODUODENOSCOPY  2009   mild gastritis  . HEMICOLECTOMY  2002   right  . [place]  . HYSTEROSCOPY    . MELANOMA EXCISION      Allergies  Allergen Reactions  . Amoxil [Amoxicillin] Diarrhea    Has patient had a PCN reaction causing immediate rash, facial/tongue/throat swelling, SOB or lightheadedness with hypotension: no Has patient had a PCN reaction causing severe rash involving mucus membranes  or skin necrosis: no Has patient had a PCN reaction that required hospitalization : no Has patient had a PCN reaction occurring within the last 10 years: no If all of the above answers are "NO", then may proceed with Cephalosporin use.   Otho Darner Allergy]     sensitivity  . Demerol     unknown  . Meperidine Hcl Other (See Comments)    Drop in blood pressure  . Infed [Iron Dextran] Rash    Became very red over face, scalp, and arms  . Neosporin [Neomycin-Bacitracin Zn-Polymyx] Rash    unknown    Outpatient Encounter Medications as of 01/23/2018  Medication Sig  . nystatin cream (MYCOSTATIN) Apply 1 application topically 2 (two) times daily. X 14 days  . acetaminophen (TYLENOL) 650 MG CR tablet Take 650 mg by mouth 2 (two) times daily.  Marland Kitchen antiseptic oral rinse (BIOTENE) LIQD 15 mLs by Mouth Rinse route 3 (three) times daily.  . Artificial Tear Ointment (ARTIFICIAL TEARS) ointment Place 2 drops into the left eye 2 (two) times daily.  . cholecalciferol (VITAMIN D) 1000 UNITS tablet Take 1 tablet (1,000 Units total) by mouth daily.  . Cranberry (ELLURA PO) Take 1 capsule by mouth daily.  . Eyelid Cleansers (OCUSOFT EYELID CLEANSING) PADS Apply topically as needed.  . hydrocortisone (ANUSOL-HC) 2.5 % rectal cream Place 1 application rectally daily as needed for hemorrhoids or itching.   . levothyroxine (SYNTHROID, LEVOTHROID) 75 MCG tablet TAKE 1 TABLET DAILY BEFORE BREAKFAST FOR THYROID.  . Melatonin 5 MG TABS Take by mouth at bedtime.  . methenamine (HIPREX) 1 g tablet Take 1 g by mouth at bedtime.  . methylcellulose (CITRUCEL FIBERSHAKE) oral powder Take 1 packet by mouth every other day.   . phenazopyridine (PYRIDIUM) 200 MG tablet Take 200 mg by mouth 3 (three) times daily as needed for pain.  Vladimir Faster Glycol-Propyl Glycol (SYSTANE) 0.4-0.3 % SOLN Place 2 drops into the right eye 2 (two) times daily.  . Potassium Chloride ER 20 MEQ TBCR Take 20 mEq by mouth 2 (two) times  daily.  . Probiotic Product (ALIGN) 4 MG CAPS Take 1 tablet by mouth daily.    Marland Kitchen saccharomyces boulardii (FLORASTOR) 250 MG capsule Take 250 mg by mouth daily.  . sodium chloride (OCEAN) 0.65 % SOLN nasal spray Place 1 spray into both nostrils as needed for congestion.  . vitamin C (ASCORBIC ACID) 500 MG tablet Take 500 mg by mouth daily.  . [DISCONTINUED] nitrofurantoin (MACRODANTIN) 100 MG capsule Take 100 mg by mouth 2 (two) times daily.   No facility-administered encounter medications on file as of 01/23/2018.     Review of Systems  Constitutional: Negative for activity change, appetite change, chills, diaphoresis, fatigue and unexpected weight change.  Genitourinary: Negative for difficulty urinating, flank pain, frequency, hematuria and vaginal discharge.  Skin: Positive for rash (vaginal and rectal area).    Immunization History  Administered Date(s) Administered  .  H1N1 05/30/2008  . Influenza Whole 03/17/2000, 03/05/2010, 03/17/2012  . Influenza,inj,Quad PF,6+ Mos 03/14/2015  . Influenza-Unspecified 04/16/2013, 03/31/2014, 04/11/2016, 04/10/2017  . Pneumococcal Conjugate-13 09/08/2015  . Pneumococcal Polysaccharide-23 03/17/1997  . Tdap 10/09/2011  . Zoster Recombinat (Shingrix) 07/13/2017   Pertinent  Health Maintenance Due  Topic Date Due  . INFLUENZA VACCINE  01/15/2018  . DEXA SCAN  Completed  . PNA vac Low Risk Adult  Completed   Fall Risk  01/06/2018 11/26/2017 09/18/2017 01/29/2017 12/31/2016  Falls in the past year? Yes No No Yes Yes  Number falls in past yr: 1 - - 1 2 or more  Injury with Fall? No - - No Yes   Functional Status Survey:    There were no vitals filed for this visit. There is no height or weight on file to calculate BMI. Physical Exam  Constitutional: She is oriented to person, place, and time. No distress.  Neurological: She is alert and oriented to person, place, and time.  Skin: Skin is warm and dry. Rash (macular rash noted to the vaginal area  and the rectal area. No odor or drainage noted. ) noted. She is not diaphoretic. There is erythema.  Psychiatric: She has a normal mood and affect.  Nursing note and vitals reviewed.   Labs reviewed: Recent Labs    01/30/17 0700 07/22/17 09/18/17 1211  NA 133* 134* 133*  K 4.3 4.6 5.1  CL  --   --  96*  CO2  --   --  30  GLUCOSE  --   --  78  BUN 17 11 15   CREATININE 0.5 0.5 0.54*  CALCIUM  --   --  9.5   Recent Labs    01/30/17 0700 09/18/17 1211  AST 30 21  ALT 17 9  ALKPHOS 125  --   BILITOT  --  0.5  PROT  --  7.3   Recent Labs    01/30/17 0700 07/22/17 09/18/17 1211  WBC 7.0 6.4 6.7  NEUTROABS  --   --  4,710  HGB 11.8* 12.3 12.6  HCT 35* 37 36.1  MCV  --   --  89.4  PLT 283 268 253   Lab Results  Component Value Date   TSH 2.38 01/30/2017   Lab Results  Component Value Date   HGBA1C 5.9 12/02/2006   Lab Results  Component Value Date   CHOL 174 11/07/2015   HDL 79 (A) 11/07/2015   LDLCALC 78 11/07/2015   LDLDIRECT 123.7 12/08/2007   TRIG 87 11/07/2015   CHOLHDL 3 07/16/2011    Significant Diagnostic Results in last 30 days:  No results found.  Assessment/Plan  1. Vaginal candidiasis Refractory Diflucan 150 mg q weekly x 6 weeks Triamcinolone 0.1% BID x 10 days Complete 14 day course of nystatin Avoid barrier creams and powders Yogurt qam    Family/ staff Communication: stafff  Labs/tests ordered:  NA

## 2018-01-28 DIAGNOSIS — H903 Sensorineural hearing loss, bilateral: Secondary | ICD-10-CM | POA: Diagnosis not present

## 2018-02-17 DIAGNOSIS — N3000 Acute cystitis without hematuria: Secondary | ICD-10-CM | POA: Diagnosis not present

## 2018-02-17 DIAGNOSIS — R319 Hematuria, unspecified: Secondary | ICD-10-CM | POA: Diagnosis not present

## 2018-02-17 DIAGNOSIS — Z79899 Other long term (current) drug therapy: Secondary | ICD-10-CM | POA: Diagnosis not present

## 2018-02-17 DIAGNOSIS — N390 Urinary tract infection, site not specified: Secondary | ICD-10-CM | POA: Diagnosis not present

## 2018-02-18 ENCOUNTER — Encounter: Payer: Self-pay | Admitting: Obstetrics & Gynecology

## 2018-02-18 ENCOUNTER — Ambulatory Visit: Payer: Self-pay | Admitting: Gynecology

## 2018-02-18 ENCOUNTER — Ambulatory Visit (INDEPENDENT_AMBULATORY_CARE_PROVIDER_SITE_OTHER): Payer: Medicare Other | Admitting: Obstetrics & Gynecology

## 2018-02-18 DIAGNOSIS — B3731 Acute candidiasis of vulva and vagina: Secondary | ICD-10-CM

## 2018-02-18 DIAGNOSIS — B373 Candidiasis of vulva and vagina: Secondary | ICD-10-CM | POA: Diagnosis not present

## 2018-02-18 MED ORDER — TERCONAZOLE 0.8 % VA CREA: 1.0000 | TOPICAL_CREAM | Freq: Every day | VAGINAL | 2 refills | Status: AC

## 2018-02-18 NOTE — Progress Notes (Signed)
    [name] Small 1917/01/28 [id number]        84 y.o.  [id number]   RP: Vulvar irritation  HPI: Vulvar irritation worsened x about 2 weeks.  Was not improving on Clobetasol 0.05% ointment, therefore stopped about 3 days ago and mildly improving on Triamcinolone 0.01% since then.  Per patient and nurse, no urinary or stool incontinence.  No vaginal bleeding.  No pelvic pain.   OB History  Gravida Para Term Preterm AB Living  2 2 2     2   SAB TAB Ectopic Multiple Live Births               # Outcome Date GA Lbr Len/2nd Weight Sex Delivery Anes PTL Lv  2 Term           1 Term             Past medical history,surgical history, problem list, medications, allergies, family history and social history were all reviewed and documented in the EPIC chart.   Directed ROS with pertinent positives and negatives documented in the history of present illness/assessment and plan.  Exam:  There were no vitals filed for this visit. General appearance:  Normal   Gynecologic exam:  Vulva:  Erythema and dry skin at vulva bilaterally all the way to the inguinal areas.  Mild whitish vaginal discharge  C/O yeast vulvovaginitis.   Assessment/Plan:  84 y.o. G2P2002   1. Vulvovaginitis due to yeast Acute vulvitis with Yeast vulvovaginitis.  Treatment with Terazol 3 discussed with patient and with nurse from [facility] by phone.  Will continue with Triamcinolone 0.01% twice daily and add Terazol 3 with vaginal applicator and cream on the vulva at bedtime x 3.  Refill x 2.  May use Vaseline on vulva to protect skin from irritants after completing the treatment.  Other orders - terconazole (TERAZOL 3) 0.8 % vaginal cream; Place 1 applicator vaginally at bedtime for 3 days. Vaginal and vulvar application  Counseling on above issues and coordination of care >50% x 15 minutes.  Princess Bruins MD, 1:15 PM 02/18/2018

## 2018-02-18 NOTE — Patient Instructions (Signed)
1. Vulvovaginitis due to yeast Acute vulvitis with Yeast vulvovaginitis.  Treatment with Terazol 3 discussed with patient and with nurse from [facility] by phone.  Will continue with Triamcinolone 0.01% twice daily and add Terazol 3 with vaginal applicator and cream on the vulva at bedtime x 3.  Refill x 2.  May use Vaseline on vulva to protect skin from irritants after completing the treatment.  Other orders - terconazole (TERAZOL 3) 0.8 % vaginal cream; Place 1 applicator vaginally at bedtime for 3 days. Vaginal and vulvar application  Jerilynn, it was a pleasure meeting you today!

## 2018-03-03 DIAGNOSIS — L304 Erythema intertrigo: Secondary | ICD-10-CM | POA: Diagnosis not present

## 2018-03-03 DIAGNOSIS — L814 Other melanin hyperpigmentation: Secondary | ICD-10-CM | POA: Diagnosis not present

## 2018-03-03 DIAGNOSIS — Z85828 Personal history of other malignant neoplasm of skin: Secondary | ICD-10-CM | POA: Diagnosis not present

## 2018-03-03 DIAGNOSIS — L570 Actinic keratosis: Secondary | ICD-10-CM | POA: Diagnosis not present

## 2018-03-11 ENCOUNTER — Encounter (INDEPENDENT_AMBULATORY_CARE_PROVIDER_SITE_OTHER): Payer: Medicare Other | Admitting: Ophthalmology

## 2018-03-11 DIAGNOSIS — H353211 Exudative age-related macular degeneration, right eye, with active choroidal neovascularization: Secondary | ICD-10-CM | POA: Diagnosis not present

## 2018-03-11 DIAGNOSIS — H35033 Hypertensive retinopathy, bilateral: Secondary | ICD-10-CM

## 2018-03-11 DIAGNOSIS — H353122 Nonexudative age-related macular degeneration, left eye, intermediate dry stage: Secondary | ICD-10-CM

## 2018-03-11 DIAGNOSIS — H43813 Vitreous degeneration, bilateral: Secondary | ICD-10-CM

## 2018-03-11 DIAGNOSIS — I10 Essential (primary) hypertension: Secondary | ICD-10-CM | POA: Diagnosis not present

## 2018-03-13 ENCOUNTER — Encounter (INDEPENDENT_AMBULATORY_CARE_PROVIDER_SITE_OTHER): Payer: Medicare Other | Admitting: Ophthalmology

## 2018-03-16 ENCOUNTER — Encounter (INDEPENDENT_AMBULATORY_CARE_PROVIDER_SITE_OTHER): Payer: Self-pay | Admitting: Ophthalmology

## 2018-03-25 ENCOUNTER — Encounter: Payer: Self-pay | Admitting: Internal Medicine

## 2018-04-08 ENCOUNTER — Encounter: Payer: Self-pay | Admitting: Internal Medicine

## 2018-04-08 ENCOUNTER — Non-Acute Institutional Stay: Payer: Medicare Other | Admitting: Internal Medicine

## 2018-04-08 VITALS — BP 130/70 | HR 84 | Temp 98.0°F | Ht <= 58 in | Wt 112.0 lb

## 2018-04-08 DIAGNOSIS — R339 Retention of urine, unspecified: Secondary | ICD-10-CM

## 2018-04-08 DIAGNOSIS — F0390 Unspecified dementia without behavioral disturbance: Secondary | ICD-10-CM | POA: Diagnosis not present

## 2018-04-08 DIAGNOSIS — R531 Weakness: Secondary | ICD-10-CM | POA: Diagnosis not present

## 2018-04-08 DIAGNOSIS — H353230 Exudative age-related macular degeneration, bilateral, stage unspecified: Secondary | ICD-10-CM

## 2018-04-08 DIAGNOSIS — H903 Sensorineural hearing loss, bilateral: Secondary | ICD-10-CM | POA: Diagnosis not present

## 2018-04-08 DIAGNOSIS — K5909 Other constipation: Secondary | ICD-10-CM

## 2018-04-08 NOTE — Progress Notes (Signed)
Location:  [facility] clinic Provider:  Biddie Sebek L. Mariea Clonts, D.O., C.M.D.  Code Status: DNR Goals of Care:  Advanced Directives 04/08/2018  Does Patient Have a Medical Advance Directive? Yes  Type of Paramedic of [place];Living will;Out of facility DNR (pink MOST or yellow form)  Does patient want to make changes to medical advance directive? No - Patient declined  Copy of [place] in Chart? Yes  Pre-existing out of facility DNR order (yellow form or pink MOST form) Yellow form placed in chart (order not valid for inpatient use);Pink MOST form placed in chart (order not valid for inpatient use)   Chief Complaint  Patient presents with  . Medical Management of Chronic Issues    58mth follow-up    HPI: Patient is a 84 y.o. female seen today for medical management of chronic diseases.    Her mobility appears significantly worse.  It was quite time consuming for her to stand.  She does everything slowly.  It's also harder for her to walk.  1-2 times per week she goes down to the second floor.  She wants to have someone with her for this now.    Hearing is worse today.  She had her hearing aids adjusted several times.    Seen a month ago for her eyes.  Goes every three months to ophtho.    Says she is eating too much and needs to lose 5 lbs.    One of her friends is moving here.  She says she is 60 years younger.    She reads a lot to keep busy.    She is trying to avoid milk products b/c they give her loose bowels.  She still has it after prune juice if she's constipated (does this 2x per week).  She also takes her orange drink that helps her go to the bathroom.    She does not get to urinate during the day.  She cannot go unless she's lying down.  Continues to get up as many as 6 times per night.  Gets in and out catheterization.  This usually goes well.  Gets cathed three times a day and 1130-12 at night.  When she gets up at night, she urinates  on her own.  Thinks the frequency at night is even worse.    She was treated for a yeast infection last month with Dr. Dellis Filbert.  Seems to come and go month to month she says.  Not bothersome right now.    Past Medical History:  Diagnosis Date  . Anemia, unspecified   . Asymptomatic varicose veins   . Carotid bruit    hx of  . Carotid bruit    doppler normal in the past  . Diverticulosis   . Dyslipidemia   . Esophageal reflux   . Female bladder prolapse, acquired 07/17/2010  . Fibroid   . Full incontinence of feces   . Hemorrhoid    1963 surgical excision  . History of colon cancer    Adenocarcinoma of right colon, stage T2, N1.s/p resection/chemotherapy 2002  . HTN (hypertension)   . Hx of colonic polyps    1965 surgical excision  . Hypothyroidism   . Idiopathic osteoporosis   . Idiopathic osteoporosis   . Internal hemorrhoids without mention of complication    bleed easily  . Irritable bowel syndrome   . Melanoma of skin, site unspecified   . MVP (mitral valve prolapse)   . OA (osteoarthritis)  left hip  . Osteoarthrosis, unspecified whether generalized or localized, pelvic region and thigh   . Peripheral neuropathy    hands/ feet  . 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 Spinal stenosis, unspecified region other than cervical   . Vaginal bleeding, abnormal   . Vaginitis, atrophic   . Varicose veins     Past Surgical History:  Procedure Laterality Date  . COLONOSCOPY W/ POLYPECTOMY  2006   3 mm polyp destroyed, diverticulosis  . DILATION AND CURETTAGE OF UTERUS    . ESOPHAGOGASTRODUODENOSCOPY  2009   mild gastritis  . HEMICOLECTOMY  2002   right  . [place]  . HYSTEROSCOPY    . MELANOMA EXCISION      Allergies  Allergen Reactions  . Amoxil [Amoxicillin] Diarrhea    Has patient had a PCN reaction causing immediate rash, facial/tongue/throat swelling, SOB or lightheadedness with hypotension: no Has patient had a PCN reaction causing severe  rash involving mucus membranes or skin necrosis: no Has patient had a PCN reaction that required hospitalization : no Has patient had a PCN reaction occurring within the last 10 years: no If all of the above answers are "NO", then may proceed with Cephalosporin use.   Otho Darner Allergy]     sensitivity  . Demerol     unknown  . Meperidine Hcl Other (See Comments)    Drop in blood pressure  . Infed [Iron Dextran] Rash    Became very red over face, scalp, and arms  . Neosporin [Neomycin-Bacitracin Zn-Polymyx] Rash    unknown    Outpatient Encounter Medications as of 04/08/2018  Medication Sig  . antiseptic oral rinse (BIOTENE) LIQD 15 mLs by Mouth Rinse route 3 (three) times daily.  . Artificial Tear Ointment (ARTIFICIAL TEARS) ointment Place 2 drops into the left eye 2 (two) times daily.  . cholecalciferol (VITAMIN D) 1000 UNITS tablet Take 1 tablet (1,000 Units total) by mouth daily.  . Cranberry (ELLURA PO) Take 1 capsule by mouth daily.  . Eyelid Cleansers (OCUSOFT EYELID CLEANSING) PADS Apply topically as needed.  . gabapentin (NEURONTIN) 100 MG capsule Take 100 mg by mouth at bedtime.  . hydrocortisone (ANUSOL-HC) 2.5 % rectal cream Place 1 application rectally daily as needed for hemorrhoids or itching.   . hydrocortisone 2.5 % cream Apply 1 application topically as needed.  Marland Kitchen ketoconazole (NIZORAL) 2 % cream Apply 1 application topically as needed for irritation.  Marland Kitchen levothyroxine (SYNTHROID, LEVOTHROID) 75 MCG tablet TAKE 1 TABLET DAILY BEFORE BREAKFAST FOR THYROID.  . Melatonin 5 MG TABS Take by mouth at bedtime.  . methenamine (HIPREX) 1 g tablet Take 1 g by mouth at bedtime.  . methylcellulose (CITRUCEL FIBERSHAKE) oral powder Take 1 packet by mouth every other day.   . phenazopyridine (PYRIDIUM) 200 MG tablet Take 200 mg by mouth 3 (three) times daily as needed for pain.  Vladimir Faster Glycol-Propyl Glycol (SYSTANE) 0.4-0.3 % SOLN Place 2 drops into the right eye 2  (two) times daily.  . Potassium Chloride ER 20 MEQ TBCR Take 20 mEq by mouth 2 (two) times daily.  . Probiotic Product (ALIGN) 4 MG CAPS Take 1 tablet by mouth daily.    . vitamin C (ASCORBIC ACID) 500 MG tablet Take 500 mg by mouth daily.  . [DISCONTINUED] acetaminophen (TYLENOL) 650 MG CR tablet Take 650 mg by mouth 2 (two) times daily.  . [DISCONTINUED] nystatin cream (MYCOSTATIN) Apply 1 application topically 2 (two) times daily. X 14 days  . [  DISCONTINUED] saccharomyces boulardii (FLORASTOR) 250 MG capsule Take 250 mg by mouth daily.  . [DISCONTINUED] sodium chloride (OCEAN) 0.65 % SOLN nasal spray Place 1 spray into both nostrils as needed for congestion.   No facility-administered encounter medications on file as of 04/08/2018.     Review of Systems:  Review of Systems  Constitutional: Positive for malaise/fatigue. Negative for chills and fever.       Wt gain  HENT: Positive for hearing loss.   Eyes:       Poor vision, still reading large print books  Respiratory: Negative for cough and shortness of breath.   Cardiovascular: Negative for chest pain, palpitations and leg swelling.  Gastrointestinal: Positive for constipation. Negative for abdominal pain, blood in stool, diarrhea and melena.  Genitourinary: Negative for dysuria.       Vaginitis better  Musculoskeletal: Positive for myalgias. Negative for falls.  Neurological: Negative for dizziness and loss of consciousness.  Psychiatric/Behavioral: Positive for memory loss. Negative for depression. The patient is nervous/anxious.     Health Maintenance  Topic Date Due  . INFLUENZA VACCINE  01/15/2018  . TETANUS/TDAP  10/08/2021  . DEXA SCAN  Completed  . PNA vac Low Risk Adult  Completed    Physical Exam: Vitals:   04/08/18 1339  BP: 130/70  Pulse: 84  Temp: 98 F (36.7 C)  TempSrc: Oral  SpO2: 95%  Weight: 112 lb (50.8 kg)  Height: 4\' 8"  (1.422 m)   Body mass index is 25.11 kg/m. Physical Exam    Constitutional: She is oriented to person, place, and time. No distress.  Cardiovascular: Normal rate, regular rhythm, normal heart sounds and intact distal pulses.  Pulmonary/Chest: Effort normal and breath sounds normal. No respiratory distress.  Abdominal: Bowel sounds are normal.  Musculoskeletal: Normal range of motion.  Neurological: She is alert and oriented to person, place, and time.  Skin: Skin is warm and dry.    Labs reviewed: Basic Metabolic Panel: Recent Labs    07/22/17 09/18/17 1211  NA 134* 133*  K 4.6 5.1  CL  --  96*  CO2  --  30  GLUCOSE  --  78  BUN 11 15  CREATININE 0.5 0.54*  CALCIUM  --  9.5   Liver Function Tests: Recent Labs    09/18/17 1211  AST 21  ALT 9  BILITOT 0.5  PROT 7.3   No results for input(s): LIPASE, AMYLASE in the last 8760 hours. No results for input(s): AMMONIA in the last 8760 hours. CBC: Recent Labs    07/22/17 09/18/17 1211  WBC 6.4 6.7  NEUTROABS  --  4,710  HGB 12.3 12.6  HCT 37 36.1  MCV  --  89.4  PLT 268 253   Lipid Panel: No results for input(s): CHOL, HDL, LDLCALC, TRIG, CHOLHDL, LDLDIRECT in the last 8760 hours. Lab Results  Component Value Date   HGBA1C 5.9 12/02/2006    Assessment/Plan 1. Urinary retention -ongoing issue, sees urology regularly, gets I/O caths  2. Dementia without behavioral disturbance, unspecified dementia type ([place]) -typically repeats history about her bowels and bladder over and over -is also extremely HOH and vision poor and these do not help her cognitive status--seems clearer than usual today  3. Bilateral exudative age-related macular degeneration, unspecified stage ([place]) -continues to gradually progress, getting treatments with retina specialists  4. Sensorineural hearing loss (SNHL) of both ears -continues hearing aids, but still does not hear well, no cerumen impaction present  5. Chronic constipation with  overflow incontinence -cont prune juice, citrucel and  hydration  6. Generalized weakness -seems to be worsening with more difficulty with get up and go, appeared to have pain when walking with the walker, but we also noted her brace for foot drop had not been put on properly  Labs/tests ordered:  No orders of the defined types were placed in this encounter.  Next appt:  08/12/2018   Janyiah Silveri L. Kensey Luepke, D.O. [facility] [address],  [address] Cell Phone (Mon-Fri 8am-5pm):  [phone] On Call:  [phone] & follow prompts after 5pm & weekends Office Phone:  [phone] Office Fax:  [phone]

## 2018-04-16 DIAGNOSIS — Z23 Encounter for immunization: Secondary | ICD-10-CM | POA: Diagnosis not present

## 2018-05-20 ENCOUNTER — Encounter (INDEPENDENT_AMBULATORY_CARE_PROVIDER_SITE_OTHER): Payer: Medicare Other | Admitting: Ophthalmology

## 2018-05-20 DIAGNOSIS — H353211 Exudative age-related macular degeneration, right eye, with active choroidal neovascularization: Secondary | ICD-10-CM

## 2018-05-20 DIAGNOSIS — I10 Essential (primary) hypertension: Secondary | ICD-10-CM

## 2018-05-20 DIAGNOSIS — H43813 Vitreous degeneration, bilateral: Secondary | ICD-10-CM

## 2018-05-20 DIAGNOSIS — D3132 Benign neoplasm of left choroid: Secondary | ICD-10-CM | POA: Diagnosis not present

## 2018-05-20 DIAGNOSIS — H2513 Age-related nuclear cataract, bilateral: Secondary | ICD-10-CM

## 2018-05-20 DIAGNOSIS — H35033 Hypertensive retinopathy, bilateral: Secondary | ICD-10-CM | POA: Diagnosis not present

## 2018-05-20 DIAGNOSIS — H353122 Nonexudative age-related macular degeneration, left eye, intermediate dry stage: Secondary | ICD-10-CM | POA: Diagnosis not present

## 2018-05-22 DIAGNOSIS — N3020 Other chronic cystitis without hematuria: Secondary | ICD-10-CM | POA: Diagnosis not present

## 2018-05-22 DIAGNOSIS — D3000 Benign neoplasm of unspecified kidney: Secondary | ICD-10-CM | POA: Diagnosis not present

## 2018-05-22 DIAGNOSIS — N312 Flaccid neuropathic bladder, not elsewhere classified: Secondary | ICD-10-CM | POA: Diagnosis not present

## 2018-08-12 ENCOUNTER — Encounter (INDEPENDENT_AMBULATORY_CARE_PROVIDER_SITE_OTHER): Payer: Medicare Other | Admitting: Ophthalmology

## 2018-08-12 ENCOUNTER — Encounter: Payer: Medicare Other | Admitting: Internal Medicine

## 2018-08-12 DIAGNOSIS — H35033 Hypertensive retinopathy, bilateral: Secondary | ICD-10-CM

## 2018-08-12 DIAGNOSIS — D3132 Benign neoplasm of left choroid: Secondary | ICD-10-CM

## 2018-08-12 DIAGNOSIS — H353211 Exudative age-related macular degeneration, right eye, with active choroidal neovascularization: Secondary | ICD-10-CM

## 2018-08-12 DIAGNOSIS — I10 Essential (primary) hypertension: Secondary | ICD-10-CM

## 2018-08-12 DIAGNOSIS — H2513 Age-related nuclear cataract, bilateral: Secondary | ICD-10-CM

## 2018-08-12 DIAGNOSIS — H353122 Nonexudative age-related macular degeneration, left eye, intermediate dry stage: Secondary | ICD-10-CM

## 2018-08-12 DIAGNOSIS — H43813 Vitreous degeneration, bilateral: Secondary | ICD-10-CM | POA: Diagnosis not present

## 2018-08-19 ENCOUNTER — Encounter: Payer: Medicare Other | Admitting: Internal Medicine

## 2018-08-20 DIAGNOSIS — N312 Flaccid neuropathic bladder, not elsewhere classified: Secondary | ICD-10-CM | POA: Diagnosis not present

## 2018-08-20 DIAGNOSIS — D3000 Benign neoplasm of unspecified kidney: Secondary | ICD-10-CM | POA: Diagnosis not present

## 2018-08-20 DIAGNOSIS — R8279 Other abnormal findings on microbiological examination of urine: Secondary | ICD-10-CM | POA: Diagnosis not present

## 2018-10-12 ENCOUNTER — Encounter: Payer: Self-pay | Admitting: Adult Health

## 2018-10-12 ENCOUNTER — Non-Acute Institutional Stay: Payer: Medicare Other | Admitting: Adult Health

## 2018-10-12 DIAGNOSIS — B373 Candidiasis of vulva and vagina: Secondary | ICD-10-CM | POA: Diagnosis not present

## 2018-10-12 DIAGNOSIS — B3731 Acute candidiasis of vulva and vagina: Secondary | ICD-10-CM

## 2018-10-12 NOTE — Progress Notes (Signed)
Location:  [facility] of Service:  ALF (13) Provider:   Cindi Carbon, ANP [place] [phone]   Gayland Curry, DO  Patient Care Team: Gayland Curry, DO as PCP - General (Geriatric Medicine) Terrance Mass, MD (Inactive) as Consulting Physician (Gynecology) Shon Hough, MD as Consulting Physician (Ophthalmology) Carolan Clines, MD (Inactive) as Consulting Physician (Urology) Gatha Mayer, MD as Consulting Physician (Gastroenterology) Wyatt Portela, MD as Consulting Physician (Oncology)  Extended Emergency Contact Information Primary Emergency Contact: Fox,Richard (Dr) Address: [address]          [place], [place] Phone: [phone] Work Phone: [phone] Relation: Alanson Puls Secondary Emergency Contact: Claudell Kyle Address: [place] [address] Johnnette Litter of [place] Mobile Phone: [phone] Relation: Son  Code Status:  DNR Goals of care: Advanced Directive information Advanced Directives 04/08/2018  Does Patient Have a Medical Advance Directive? Yes  Type of Paramedic of [place];Living will;Out of facility DNR (pink MOST or yellow form)  Does patient want to make changes to medical advance directive? No - Patient declined  Copy of [place] in Chart? Yes  Pre-existing out of facility DNR order (yellow form or pink MOST form) Yellow form placed in chart (order not valid for inpatient use);Pink MOST form placed in chart (order not valid for inpatient use)     Chief Complaint  Patient presents with  . Acute Visit    vaginal rash    HPI:  Pt is a 85 y.o. female seen today for an acute visit for a vaginal rash with irritation. The staff report a reddened raised rash to her vaginal area. She reports some irritation but no itching or drainage. No fever or abd discomfort. No urinary symptoms.  She  is catheterized regularly due to urinary retention.    Past Medical History:  Diagnosis Date  . Anemia, unspecified   . Asymptomatic varicose veins   . Carotid bruit    hx of  . Carotid bruit    doppler normal in the past  . Diverticulosis   . Dyslipidemia   . Esophageal reflux   . Female bladder prolapse, acquired 07/17/2010  . Fibroid   . Full incontinence of feces   . Hemorrhoid    1963 surgical excision  . History of colon cancer    Adenocarcinoma of right colon, stage T2, N1.s/p resection/chemotherapy 2002  . HTN (hypertension)   . Hx of colonic polyps    1965 surgical excision  . Hypothyroidism   . Idiopathic osteoporosis   . Idiopathic osteoporosis   . Internal hemorrhoids without mention of complication    bleed easily  . Irritable bowel syndrome   . Melanoma of skin, site unspecified   . MVP (mitral valve prolapse)   . OA (osteoarthritis)    left hip  . Osteoarthrosis, unspecified whether generalized or localized, pelvic region and thigh   . Peripheral neuropathy    hands/ feet  . 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 Spinal stenosis, unspecified region other than cervical   . Vaginal bleeding, abnormal   . Vaginitis, atrophic   . Varicose veins    Past Surgical History:  Procedure Laterality Date  . COLONOSCOPY W/ POLYPECTOMY  2006   3 mm polyp destroyed, diverticulosis  . DILATION AND CURETTAGE OF UTERUS    . ESOPHAGOGASTRODUODENOSCOPY  2009   mild  gastritis  . HEMICOLECTOMY  2002   right  . [place]  . HYSTEROSCOPY    . MELANOMA EXCISION      Allergies  Allergen Reactions  . Amoxil [Amoxicillin] Diarrhea    Has patient had a PCN reaction causing immediate rash, facial/tongue/throat swelling, SOB or lightheadedness with hypotension: no Has patient had a PCN reaction causing severe rash involving mucus membranes or skin necrosis: no Has patient had a PCN reaction that required hospitalization : no Has patient had a PCN reaction  occurring within the last 10 years: no If all of the above answers are "NO", then may proceed with Cephalosporin use.   Otho Darner Allergy]     sensitivity  . Demerol     unknown  . Meperidine Hcl Other (See Comments)    Drop in blood pressure  . Infed [Iron Dextran] Rash    Became very red over face, scalp, and arms  . Neosporin [Neomycin-Bacitracin Zn-Polymyx] Rash    unknown    Outpatient Encounter Medications as of 10/12/2018  Medication Sig  . antiseptic oral rinse (BIOTENE) LIQD 15 mLs by Mouth Rinse route 3 (three) times daily.  . Artificial Tear Ointment (ARTIFICIAL TEARS) ointment Place 2 drops into the left eye 2 (two) times daily.  . cholecalciferol (VITAMIN D) 1000 UNITS tablet Take 1 tablet (1,000 Units total) by mouth daily.  . Cranberry (ELLURA PO) Take 1 capsule by mouth daily.  . Eyelid Cleansers (OCUSOFT EYELID CLEANSING) PADS Apply topically as needed.  . gabapentin (NEURONTIN) 100 MG capsule Take 100 mg by mouth at bedtime.  . hydrocortisone (ANUSOL-HC) 2.5 % rectal cream Place 1 application rectally daily as needed for hemorrhoids or itching.   . hydrocortisone 2.5 % cream Apply 1 application topically as needed.  Marland Kitchen ketoconazole (NIZORAL) 2 % cream Apply 1 application topically as needed for irritation.  Marland Kitchen levothyroxine (SYNTHROID, LEVOTHROID) 75 MCG tablet TAKE 1 TABLET DAILY BEFORE BREAKFAST FOR THYROID.  . Melatonin 5 MG TABS Take by mouth at bedtime.  . methenamine (HIPREX) 1 g tablet Take 1 g by mouth at bedtime.  . methylcellulose (CITRUCEL FIBERSHAKE) oral powder Take 1 packet by mouth every other day.   . phenazopyridine (PYRIDIUM) 200 MG tablet Take 200 mg by mouth 3 (three) times daily as needed for pain.  Vladimir Faster Glycol-Propyl Glycol (SYSTANE) 0.4-0.3 % SOLN Place 2 drops into the right eye 2 (two) times daily.  . Potassium Chloride ER 20 MEQ TBCR Take 20 mEq by mouth 2 (two) times daily.  . Probiotic Product (ALIGN) 4 MG CAPS Take 1 tablet  by mouth daily.    . vitamin C (ASCORBIC ACID) 500 MG tablet Take 500 mg by mouth daily.   No facility-administered encounter medications on file as of 10/12/2018.     Review of Systems  Constitutional: Negative for activity change, appetite change, chills, diaphoresis, fatigue and fever.  Genitourinary: Negative for decreased urine volume, difficulty urinating, dysuria, frequency, hematuria, urgency, vaginal bleeding, vaginal discharge and vaginal pain.  Skin: Positive for color change and rash. Negative for pallor and wound.    Immunization History  Administered Date(s) Administered  . H1N1 05/30/2008  . Influenza Whole 03/17/2000, 03/05/2010, 03/17/2012  . Influenza,inj,Quad PF,6+ Mos 03/14/2015, 04/07/2018  . Influenza-Unspecified 04/16/2013, 03/31/2014, 04/11/2016, 04/10/2017  . Pneumococcal Conjugate-13 09/08/2015  . Pneumococcal Polysaccharide-23 03/17/1997  . Tdap 10/09/2011  . Zoster Recombinat (Shingrix) 07/13/2017   Pertinent  Health Maintenance Due  Topic Date Due  . INFLUENZA  VACCINE  01/16/2019  . DEXA SCAN  Completed  . PNA vac Low Risk Adult  Completed   Fall Risk  04/08/2018 01/06/2018 11/26/2017 09/18/2017 01/29/2017  Falls in the past year? No Yes No No Yes  Number falls in past yr: - 1 - - 1  Injury with Fall? - No - - No   Functional Status Survey:    Vitals:   10/12/18 1325  Temp: (!) 97.2 F (36.2 C)   There is no height or weight on file to calculate BMI. Physical Exam Vitals signs and nursing note reviewed.  Constitutional:      Appearance: Normal appearance.  Abdominal:     General: Abdomen is flat. Bowel sounds are normal. There is no distension.     Palpations: Abdomen is soft.     Tenderness: There is no abdominal tenderness. There is no right CVA tenderness or left CVA tenderness.  Skin:    General: Skin is warm and dry.     Findings: Erythema (raised erythematous rash to vulva and vaginal area ) present.  Neurological:     Mental Status:  She is alert and oriented to person, place, and time. Mental status is at baseline.  Psychiatric:        Mood and Affect: Mood normal.     Labs reviewed: No results for input(s): NA, K, CL, CO2, GLUCOSE, BUN, CREATININE, CALCIUM, MG, PHOS in the last 8760 hours. No results for input(s): AST, ALT, ALKPHOS, BILITOT, PROT, ALBUMIN in the last 8760 hours. No results for input(s): WBC, NEUTROABS, HGB, HCT, MCV, PLT in the last 8760 hours. Lab Results  Component Value Date   TSH 2.38 01/30/2017   Lab Results  Component Value Date   HGBA1C 5.9 12/02/2006   Lab Results  Component Value Date   CHOL 174 11/07/2015   HDL 79 (A) 11/07/2015   LDLCALC 78 11/07/2015   LDLDIRECT 123.7 12/08/2007   TRIG 87 11/07/2015   CHOLHDL 3 07/16/2011    Significant Diagnostic Results in last 30 days:  No results found.  Assessment/Plan  1. Vaginal candidiasis Likely yeast related, if no improvement consider lichen planus Triamcinolone 0.01 % ointment bid to vaginal area x 10 days Terazol 3 via vaginal applicator qhs x 3 days apply vaginal and vulva area.    Family/ staff Communication: discussed with resident and staff   Labs/tests ordered: NA

## 2018-11-25 DIAGNOSIS — Z20828 Contact with and (suspected) exposure to other viral communicable diseases: Secondary | ICD-10-CM | POA: Diagnosis not present

## 2018-12-03 ENCOUNTER — Encounter (INDEPENDENT_AMBULATORY_CARE_PROVIDER_SITE_OTHER): Payer: Medicare Other | Admitting: Ophthalmology

## 2018-12-25 DIAGNOSIS — Z20828 Contact with and (suspected) exposure to other viral communicable diseases: Secondary | ICD-10-CM | POA: Diagnosis not present

## 2019-01-04 LAB — NOVEL CORONAVIRUS, NAA: SARS-CoV-2, NAA: NEGATIVE

## 2019-01-07 ENCOUNTER — Encounter: Payer: Self-pay | Admitting: Internal Medicine

## 2019-02-05 ENCOUNTER — Encounter: Payer: Self-pay | Admitting: Adult Health

## 2019-02-05 ENCOUNTER — Non-Acute Institutional Stay: Payer: Medicare Other | Admitting: Adult Health

## 2019-02-05 DIAGNOSIS — L03114 Cellulitis of left upper limb: Secondary | ICD-10-CM | POA: Diagnosis not present

## 2019-02-05 DIAGNOSIS — R198 Other specified symptoms and signs involving the digestive system and abdomen: Secondary | ICD-10-CM

## 2019-02-05 NOTE — Progress Notes (Signed)
Location:  Occupational psychologist of Service:  ALF (13) Provider:   Cindi Carbon, ANP [place] [phone]   Gayland Curry, DO  Patient Care Team: Gayland Curry, DO as PCP - General (Geriatric Medicine) Terrance Mass, MD (Inactive) as Consulting Physician (Gynecology) Shon Hough, MD as Consulting Physician (Ophthalmology) Carolan Clines, MD (Inactive) as Consulting Physician (Urology) Gatha Mayer, MD as Consulting Physician (Gastroenterology) Wyatt Portela, MD as Consulting Physician (Oncology)  Extended Emergency Contact Information Primary Emergency Contact: Fox,Richard (Dr) Address: [address]          [place], [place] Phone: [phone] Work Phone: [phone] Relation: Alanson Puls Secondary Emergency Contact: Claudell Kyle Address: [place] [address] Johnnette Litter of [place] Mobile Phone: [phone] Relation: Son  Code Status:  DNR Goals of care: Advanced Directive information Advanced Directives 04/08/2018  Does Patient Have a Medical Advance Directive? Yes  Type of Paramedic of [place];Living will;Out of facility DNR (pink MOST or yellow form)  Does patient want to make changes to medical advance directive? No - Patient declined  Copy of [place] in Chart? Yes  Pre-existing out of facility DNR order (yellow form or pink MOST form) Yellow form placed in chart (order not valid for inpatient use);Pink MOST form placed in chart (order not valid for inpatient use)     Chief Complaint  Patient presents with  . Acute Visit    arm swelling    HPI:  Pt is a 85 y.o. female seen today for an acute visit for left arm swelling and redness noted today by the nurse. The resident reports that her arm has been tender and uncomfortable for "a long time" but did not notice the redness. She has not had a fever,  numbness, tingling, cp, sob, or change in mentation. She is HOH and difficulty to communicate with during the visit.  She expresses concern that her bowel movements have changes and are loose on regular basis. She has a hx of colon cancer with resection and is concerned that the change in bowel habits has returned. The nursing staff has not noted any loose stools. She has a hx of IBS and at times has issues with lactose. She denies any abd pain, blood in stool, nausea, vomiting, or weight loss.    Past Medical History:  Diagnosis Date  . Anemia, unspecified   . Asymptomatic varicose veins   . Carotid bruit    hx of  . Carotid bruit    doppler normal in the past  . Diverticulosis   . Dyslipidemia   . Esophageal reflux   . Female bladder prolapse, acquired 07/17/2010  . Fibroid   . Full incontinence of feces   . Hemorrhoid    1963 surgical excision  . History of colon cancer    Adenocarcinoma of right colon, stage T2, N1.s/p resection/chemotherapy 2002  . HTN (hypertension)   . Hx of colonic polyps    1965 surgical excision  . Hypothyroidism   . Idiopathic osteoporosis   . Idiopathic osteoporosis   . Internal hemorrhoids without mention of complication    bleed easily  . Irritable bowel syndrome   . Melanoma of skin, site unspecified   . MVP (mitral valve prolapse)   . OA (osteoarthritis)    left hip  . Osteoarthrosis, unspecified whether generalized or localized, pelvic  region and thigh   . Peripheral neuropathy    hands/ feet  . 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 Spinal stenosis, unspecified region other than cervical   . Vaginal bleeding, abnormal   . Vaginitis, atrophic   . Varicose veins    Past Surgical History:  Procedure Laterality Date  . COLONOSCOPY W/ POLYPECTOMY  2006   3 mm polyp destroyed, diverticulosis  . DILATION AND CURETTAGE OF UTERUS    . ESOPHAGOGASTRODUODENOSCOPY  2009   mild gastritis  . HEMICOLECTOMY  2002   right  . [place]   . HYSTEROSCOPY    . MELANOMA EXCISION      Allergies  Allergen Reactions  . Amoxil [Amoxicillin] Diarrhea    Has patient had a PCN reaction causing immediate rash, facial/tongue/throat swelling, SOB or lightheadedness with hypotension: no Has patient had a PCN reaction causing severe rash involving mucus membranes or skin necrosis: no Has patient had a PCN reaction that required hospitalization : no Has patient had a PCN reaction occurring within the last 10 years: no If all of the above answers are "NO", then may proceed with Cephalosporin use.   Otho Darner Allergy]     sensitivity  . Demerol     unknown  . Meperidine Hcl Other (See Comments)    Drop in blood pressure  . Infed [Iron Dextran] Rash    Became very red over face, scalp, and arms  . Neosporin [Neomycin-Bacitracin Zn-Polymyx] Rash    unknown    Outpatient Encounter Medications as of 02/05/2019  Medication Sig  . acetaminophen (TYLENOL) 325 MG tablet Take 650 mg by mouth 2 (two) times daily.  Marland Kitchen antiseptic oral rinse (BIOTENE) LIQD 15 mLs by Mouth Rinse route 3 (three) times daily.  . cholecalciferol (VITAMIN D) 1000 UNITS tablet Take 1 tablet (1,000 Units total) by mouth daily.  . Cranberry (ELLURA PO) Take 1 capsule by mouth 2 (two) times daily.   . Eyelid Cleansers (OCUSOFT EYELID CLEANSING) PADS Apply topically as needed.  . gabapentin (NEURONTIN) 100 MG capsule Take 100 mg by mouth at bedtime.  . hydrocortisone (ANUSOL-HC) 2.5 % rectal cream Place 1 application rectally daily as needed for hemorrhoids or itching.   . hydrocortisone 2.5 % cream Apply 1 application topically as needed.  Marland Kitchen ketoconazole (NIZORAL) 2 % cream Apply 1 application topically as needed for irritation.  Marland Kitchen levothyroxine (SYNTHROID, LEVOTHROID) 75 MCG tablet TAKE 1 TABLET DAILY BEFORE BREAKFAST FOR THYROID.  . Melatonin 5 MG TABS Take by mouth at bedtime.  . methenamine (HIPREX) 1 g tablet Take 1 g by mouth at bedtime.  .  methylcellulose (CITRUCEL FIBERSHAKE) oral powder Take 1 packet by mouth daily as needed.   . phenazopyridine (PYRIDIUM) 200 MG tablet Take 200 mg by mouth 3 (three) times daily as needed for pain.  Vladimir Faster Glycol-Propyl Glycol (SYSTANE) 0.4-0.3 % SOLN Place 2 drops into the right eye 2 (two) times daily.  . Potassium Chloride ER 20 MEQ TBCR Take 20 mEq by mouth 2 (two) times daily.  . Probiotic Product (ALIGN) 4 MG CAPS Take 1 tablet by mouth daily.    . vitamin C (ASCORBIC ACID) 500 MG tablet Take 500 mg by mouth daily.  . [DISCONTINUED] Artificial Tear Ointment (ARTIFICIAL TEARS) ointment Place 2 drops into the left eye 2 (two) times daily.   No facility-administered encounter medications on file as of 02/05/2019.     Review of Systems  Constitutional: Negative for activity change, appetite change,  chills, diaphoresis, fatigue, fever and unexpected weight change.  HENT: Positive for hearing loss. Negative for congestion.   Respiratory: Negative for cough, shortness of breath and wheezing.   Cardiovascular: Negative for chest pain, palpitations and leg swelling.  Gastrointestinal: Positive for diarrhea. Negative for abdominal distention, abdominal pain, blood in stool, constipation, nausea, rectal pain and vomiting.  Genitourinary: Positive for difficulty urinating (catheterized regularly). Negative for dysuria, flank pain and frequency.  Musculoskeletal: Positive for gait problem. Negative for arthralgias, back pain, joint swelling and myalgias.  Skin: Positive for color change and rash.       Left arm swelling   Neurological: Negative for dizziness, tremors, seizures, syncope, facial asymmetry, speech difficulty, weakness, light-headedness, numbness and headaches.  Psychiatric/Behavioral: Positive for confusion. Negative for agitation and behavioral problems.    Immunization History  Administered Date(s) Administered  . H1N1 05/30/2008  . Influenza Whole 03/17/2000, 03/05/2010,  03/17/2012  . Influenza,inj,Quad PF,6+ Mos 03/14/2015, 04/07/2018  . Influenza-Unspecified 04/16/2013, 03/31/2014, 04/11/2016, 04/10/2017  . Pneumococcal Conjugate-13 09/08/2015  . Pneumococcal Polysaccharide-23 03/17/1997  . Tdap 10/09/2011  . Zoster Recombinat (Shingrix) 07/13/2017   Pertinent  Health Maintenance Due  Topic Date Due  . INFLUENZA VACCINE  01/16/2019  . DEXA SCAN  Completed  . PNA vac Low Risk Adult  Completed   Fall Risk  04/08/2018 01/06/2018 11/26/2017 09/18/2017 01/29/2017  Falls in the past year? No Yes No No Yes  Number falls in past yr: - 1 - - 1  Injury with Fall? - No - - No   Functional Status Survey:    Vitals:   02/05/19 0935  BP: 116/67  Pulse: 66  Resp: 16  Temp: 98.1 F (36.7 C)  SpO2: 98%   There is no height or weight on file to calculate BMI. Physical Exam Vitals signs and nursing note reviewed.  Constitutional:      General: She is not in acute distress.    Appearance: She is not diaphoretic.  HENT:     Head: Normocephalic and atraumatic.  Neck:     Vascular: No JVD.  Cardiovascular:     Rate and Rhythm: Normal rate and regular rhythm.     Heart sounds: No murmur.  Pulmonary:     Effort: Pulmonary effort is normal. No respiratory distress.     Breath sounds: Normal breath sounds. No wheezing.  Abdominal:     General: Abdomen is flat. Bowel sounds are normal. There is no distension.     Palpations: Abdomen is soft. There is no mass.     Tenderness: There is no abdominal tenderness.  Musculoskeletal:     Right lower leg: No edema.     Left lower leg: No edema.  Skin:    General: Skin is warm and dry.     Findings: Erythema (well marked erythema and mild edema to the Left upper arm. +CMS) present.  Neurological:     Mental Status: She is alert and oriented to person, place, and time.  Psychiatric:        Mood and Affect: Mood normal.     Labs reviewed: No results for input(s): NA, K, CL, CO2, GLUCOSE, BUN, CREATININE,  CALCIUM, MG, PHOS in the last 8760 hours. No results for input(s): AST, ALT, ALKPHOS, BILITOT, PROT, ALBUMIN in the last 8760 hours. No results for input(s): WBC, NEUTROABS, HGB, HCT, MCV, PLT in the last 8760 hours. Lab Results  Component Value Date   TSH 2.38 01/30/2017   Lab Results  Component Value Date  HGBA1C 5.9 12/02/2006   Lab Results  Component Value Date   CHOL 174 11/07/2015   HDL 79 (A) 11/07/2015   LDLCALC 78 11/07/2015   LDLDIRECT 123.7 12/08/2007   TRIG 87 11/07/2015   CHOLHDL 3 07/16/2011    Significant Diagnostic Results in last 30 days:  No results found.  Assessment/Plan 1. Left arm cellulitis Doxycycline 100 mg bid x 7 days with food Mark redness with a permanent marker and report if no improvement or worsening.    2. Change in bowel function Loose stools reported by resident but no observed by staff. Possibly due to dietary changes and IBS. She is requesting a stool test for cancer because she "wants to know", although she reports she would not pursue treatment.  Will order hemoccults x 2.  Recommend bland diet.  Consider cdiff if loose stools continue.   Recommend routine f/u with Dr. Mariea Clonts   Family/ staff Communication: resident   Labs/tests ordered: hemoccult x 2

## 2019-02-12 ENCOUNTER — Non-Acute Institutional Stay: Payer: Medicare Other | Admitting: Adult Health

## 2019-02-12 ENCOUNTER — Encounter: Payer: Self-pay | Admitting: Adult Health

## 2019-02-12 DIAGNOSIS — Z0000 Encounter for general adult medical examination without abnormal findings: Secondary | ICD-10-CM | POA: Diagnosis not present

## 2019-02-12 NOTE — Progress Notes (Addendum)
Subjective:   [name] Small is a 85 y.o. female who presents for Medicare Annual (Subsequent) preventive examination at [facility] assisted living.   Review of Systems:   Cardiac Risk Factors include: advanced age (>49men, >14 women)     Objective:     Vitals: Wt 107 lb 14.4 oz (48.9 kg)   BMI 24.19 kg/m   Body mass index is 24.19 kg/m.  Advanced Directives 02/12/2019 04/08/2018 01/06/2018 11/26/2017 07/23/2017 03/26/2017 01/29/2017  Does Patient Have a Medical Advance Directive? Yes Yes Yes Yes Yes Yes Yes  Type of Paramedic of [place];Out of facility DNR (pink MOST or yellow form);Living will [place];Living will;Out of facility DNR (pink MOST or yellow form) [place];Living will;Out of facility DNR (pink MOST or yellow form) [place];Living will;Out of facility DNR (pink MOST or yellow form) [place];Living will;Out of facility DNR (pink MOST or yellow form) Out of facility DNR (pink MOST or yellow form);[place];Living will Out of facility DNR (pink MOST or yellow form);[place];Living will  Does patient want to make changes to medical advance directive? No - Patient declined No - Patient declined No - Patient declined No - Patient declined No - Patient declined - -  Copy of [place] in Chart? Yes - validated most recent copy scanned in chart (See row information) Yes Yes Yes Yes Yes Yes  Pre-existing out of facility DNR order (yellow form or pink MOST form) Pink MOST form placed in chart (order not valid for inpatient use);Yellow form placed in chart (order not valid for inpatient use) Yellow form placed in chart (order not valid for inpatient use);Pink MOST form placed in chart (order not valid for inpatient use) Yellow form placed in chart (order not valid for inpatient use);Pink MOST form placed in chart (order not valid  for inpatient use) Yellow form placed in chart (order not valid for inpatient use);Pink MOST form placed in chart (order not valid for inpatient use) Yellow form placed in chart (order not valid for inpatient use);Pink MOST form placed in chart (order not valid for inpatient use) Yellow form placed in chart (order not valid for inpatient use);Pink MOST form placed in chart (order not valid for inpatient use) Yellow form placed in chart (order not valid for inpatient use);Pink MOST form placed in chart (order not valid for inpatient use)    Tobacco Social History   Tobacco Use  Smoking Status Former Smoker  . Years: 2.00  . Types: Cigarettes  . Quit date: 09/09/1943  . Years since quitting: 75.4  Smokeless Tobacco Never Used     Counseling given: Not Answered   Clinical Intake:  Pre-visit preparation completed: No  Pain : No/denies pain     BMI - recorded: 22.5 Nutritional Status: BMI of 19-24  Normal Nutritional Risks: None Diabetes: No  How often do you need to have someone help you when you read instructions, pamphlets, or other written materials from your doctor or pharmacy?: 4 - Often  Interpreter Needed?: No  Comments: very difficult to communicate with due to hearing and vision deficits Information entered by :: Cindi Carbon NP  Past Medical History:  Diagnosis Date  . Anemia, unspecified   . Asymptomatic varicose veins   . Carotid bruit    hx of  . Carotid bruit    doppler normal in the past  . Diverticulosis   . Dyslipidemia   .  Esophageal reflux   . Female bladder prolapse, acquired 07/17/2010  . Fibroid   . Full incontinence of feces   . Hemorrhoid    1963 surgical excision  . History of colon cancer    Adenocarcinoma of right colon, stage T2, N1.s/p resection/chemotherapy 2002  . HTN (hypertension)   . Hx of colonic polyps    1965 surgical excision  . Hypothyroidism   . Idiopathic osteoporosis   . Idiopathic osteoporosis   . Internal hemorrhoids  without mention of complication    bleed easily  . Irritable bowel syndrome   . Melanoma of skin, site unspecified   . MVP (mitral valve prolapse)   . OA (osteoarthritis)    left hip  . Osteoarthrosis, unspecified whether generalized or localized, pelvic region and thigh   . Peripheral neuropathy    hands/ feet  . 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 Spinal stenosis, unspecified region other than cervical   . Vaginal bleeding, abnormal   . Vaginitis, atrophic   . Varicose veins    Past Surgical History:  Procedure Laterality Date  . COLONOSCOPY W/ POLYPECTOMY  2006   3 mm polyp destroyed, diverticulosis  . DILATION AND CURETTAGE OF UTERUS    . ESOPHAGOGASTRODUODENOSCOPY  2009   mild gastritis  . HEMICOLECTOMY  2002   right  . [place]  . HYSTEROSCOPY    . MELANOMA EXCISION     Family History  Problem Relation Age of Onset  . Heart disease Father   . Diabetes Father   . Heart disease Sister   . Stomach cancer Maternal Grandfather   . Colon cancer Neg Hx    Social History   Socioeconomic History  . Marital status: Widowed    Spouse name: Not on file  . Number of children: 2  . Years of education: Not on file  . Highest education level: Not on file  Occupational History  . Occupation: retired    Fish farm manager: RETIRED  Social Needs  . Financial resource strain: Not hard at all  . Food insecurity    Worry: Never true    Inability: Never true  . Transportation needs    Medical: No    Non-medical: No  Tobacco Use  . Smoking status: Former Smoker    Years: 2.00    Types: Cigarettes    Quit date: 09/09/1943    Years since quitting: 75.4  . Smokeless tobacco: Never Used  Substance and Sexual Activity  . Alcohol use: Yes    Alcohol/week: 0.0 standard drinks    Comment: occas wine 2 glasses a week  . Drug use: No  . Sexual activity: Never  Lifestyle  . Physical activity    Days per week: 4 days    Minutes per session: 30 min  . Stress: Only a  little  Relationships  . Social connections    Talks on phone: More than three times a week    Gets together: More than three times a week    Attends religious service: More than 4 times per year    Active member of club or organization: No    Attends meetings of clubs or organizations: Never    Relationship status: Widowed  Other Topics Concern  . Not on file  Social History Narrative   widowed after a very long and happy marriage almost 5 years. 2 sons - one a Advice worker, several grandchildren. Close and attentive family. Lives alone at [place], since 7/ 2004 :  paricipates in yoga and exercise. I-ADLs including driving. Supportive family who checks on her and visits her regularly.   Advanced Care planning documents: DNR, MOST form, Living Will   Walks with walker   Exercise: M-W-F class         Outpatient Encounter Medications as of 02/12/2019  Medication Sig  . acetaminophen (TYLENOL) 325 MG tablet Take 650 mg by mouth 2 (two) times daily.  Marland Kitchen antiseptic oral rinse (BIOTENE) LIQD 15 mLs by Mouth Rinse route 3 (three) times daily.  . cholecalciferol (VITAMIN D) 1000 UNITS tablet Take 1 tablet (1,000 Units total) by mouth daily.  . Cranberry (ELLURA PO) Take 1 capsule by mouth 2 (two) times daily.   Marland Kitchen doxycycline (VIBRAMYCIN) 100 MG capsule Take 100 mg by mouth 2 (two) times daily.  . Eyelid Cleansers (OCUSOFT EYELID CLEANSING) PADS Apply topically as needed.  . gabapentin (NEURONTIN) 100 MG capsule Take 100 mg by mouth at bedtime.  . hydrocortisone (ANUSOL-HC) 2.5 % rectal cream Place 1 application rectally daily as needed for hemorrhoids or itching.   . hydrocortisone 2.5 % cream Apply 1 application topically as needed.  Marland Kitchen ketoconazole (NIZORAL) 2 % cream Apply 1 application topically as needed for irritation.  Marland Kitchen levothyroxine (SYNTHROID, LEVOTHROID) 75 MCG tablet TAKE 1 TABLET DAILY BEFORE BREAKFAST FOR THYROID.  . Melatonin 5 MG TABS Take by mouth at bedtime as  needed.   . methenamine (HIPREX) 1 g tablet Take 1 g by mouth at bedtime.  . methylcellulose (CITRUCEL FIBERSHAKE) oral powder Take 1 packet by mouth daily as needed.   . phenazopyridine (PYRIDIUM) 200 MG tablet Take 200 mg by mouth 3 (three) times daily as needed for pain.  Vladimir Faster Glycol-Propyl Glycol (SYSTANE) 0.4-0.3 % SOLN Place 2 drops into the right eye 2 (two) times daily.  . Potassium Chloride ER 20 MEQ TBCR Take 20 mEq by mouth 2 (two) times daily. (Patient taking differently: Take 20 mEq by mouth daily. )  . Probiotic Product (ALIGN) 4 MG CAPS Take 1 tablet by mouth daily.    . vitamin C (ASCORBIC ACID) 500 MG tablet Take 500 mg by mouth daily.   No facility-administered encounter medications on file as of 02/12/2019.     Activities of Daily Living In your present state of health, do you have any difficulty performing the following activities: 02/12/2019  Hearing? Y  Vision? Y  Difficulty concentrating or making decisions? Y  Walking or climbing stairs? Y  Dressing or bathing? Y  Doing errands, shopping? Y  Preparing Food and eating ? Y  Using the Toilet? Y  In the past six months, have you accidently leaked urine? Y  Do you have problems with loss of bowel control? N  Managing your Medications? Y  Managing your Finances? Y  Housekeeping or managing your Housekeeping? Y  Some recent data might be hidden    Patient Care Team: Gayland Curry, DO as PCP - General (Geriatric Medicine) Terrance Mass, MD (Inactive) as Consulting Physician (Gynecology) Shon Hough, MD as Consulting Physician (Ophthalmology) Carolan Clines, MD (Inactive) as Consulting Physician (Urology) Gatha Mayer, MD as Consulting Physician (Gastroenterology) Wyatt Portela, MD as Consulting Physician (Oncology)    Assessment:   This is a routine wellness examination for [name] Small.  Exercise Activities and Dietary recommendations Current Exercise Habits: The patient does not  participate in regular exercise at present, Exercise limited by: orthopedic condition(s)  Goals    . DIET - EAT MORE FRUITS AND VEGETABLES  Fall Risk Fall Risk  02/12/2019 04/08/2018 01/06/2018 11/26/2017 09/18/2017  Falls in the past year? 0 No Yes No No  Number falls in past yr: 0 - 1 - -  Injury with Fall? 0 - No - -  Risk for fall due to : Impaired balance/gait - - - -  Follow up Falls evaluation completed - - - -   Is the patient's home free of loose throw rugs in walkways, pet beds, electrical cords, etc?   yes      Grab bars in the bathroom? yes      Handrails on the stairs?   yes      Adequate lighting?   yes  Timed Get Up and Go performed: not performed  Depression Screen PHQ 2/9 Scores 02/12/2019 04/08/2018 01/06/2018 11/26/2017  PHQ - 2 Score 0 0 0 0     Cognitive Function MMSE - Mini Mental State Exam 02/12/2019 11/29/2017 10/30/2016  Orientation to time 5 5 4   Orientation to Place 5 5 5   Registration 3 3 3   Attention/ Calculation 5 4 2   Recall 1 0 3  Language- name 2 objects 2 2 2   Language- repeat 1 1 1   Language- follow 3 step command 3 3 3   Language- read & follow direction 1 1 1   Write a sentence 1 1 1   Copy design 1 1 1   Total score 28 26 26         Immunization History  Administered Date(s) Administered  . H1N1 05/30/2008  . Influenza Whole 03/17/2000, 03/05/2010, 03/17/2012  . Influenza,inj,Quad PF,6+ Mos 03/14/2015, 04/07/2018  . Influenza-Unspecified 04/16/2013, 03/31/2014, 04/11/2016, 04/10/2017  . Pneumococcal Conjugate-13 09/08/2015  . Pneumococcal Polysaccharide-23 03/17/1997  . Tdap 10/09/2011  . Zoster Recombinat (Shingrix) 07/13/2017    Qualifies for Shingles Vaccine?up to date Screening Tests Health Maintenance  Topic Date Due  . INFLUENZA VACCINE  01/16/2019  . TETANUS/TDAP  10/08/2021  . DEXA SCAN  Completed  . PNA vac Low Risk Adult  Completed    Cancer Screenings: Lung: Low Dose CT Chest recommended if Age 77-80 years, 30  pack-year currently smoking OR have quit w/in 15years. Patient does not qualify. Breast:  Up to date on Mammogram? Noaged out   Up to date of Bone Density/Dexa? No Colorectal: aged out  Additional Screenings: not indicated: Hepatitis C Screening:      Plan:      I have personally reviewed and noted the following in the patient's chart:   . Medical and social history . Use of alcohol, tobacco or illicit drugs  . Current medications and supplements . Functional ability and status . Nutritional status . Physical activity . Advanced directives . List of other physicians . Hospitalizations, surgeries, and ER visits in previous 12 months . Vitals . Screenings to include cognitive, depression, and falls . Referrals and appointments  In addition, I have reviewed and discussed with patient certain preventive protocols, quality metrics, and best practice recommendations. A written personalized care plan for preventive services as well as general preventive health recommendations were provided to patient.     Royal Hawthorn, NP  02/12/2019

## 2019-02-12 NOTE — Patient Instructions (Signed)
[name] Small , Thank you for taking time to come for your Medicare Wellness Visit. I appreciate your ongoing commitment to your health goals. Please review the following plan we discussed and let me know if I can assist you in the future.   Screening recommendations/referrals: Colonoscopy aged out Mammogram aged out Bone Density not longer going out of facility due to advanced age Recommended yearly ophthalmology/optometry visit for glaucoma screening and checkup Recommended yearly dental visit for hygiene and checkup  Vaccinations: Influenza vaccine needed when [facility] Pneumococcal vaccine up to date Tdap vaccine  Up to date Shingles vaccine up to date    Advanced directives: reviewed  Conditions/risks identified: fall risk   Next appointment: 1 year   Preventive Care 34 Years and Older, Female Preventive care refers to lifestyle choices and visits with your health care provider that can promote health and wellness. What does preventive care include?  A yearly physical exam. This is also called an annual well check.  Dental exams once or twice a year.  Routine eye exams. Ask your health care provider how often you should have your eyes checked.  Personal lifestyle choices, including:  Daily care of your teeth and gums.  Regular physical activity.  Eating a healthy diet.  Avoiding tobacco and drug use.  Limiting alcohol use.  Practicing safe sex.  Taking low-dose aspirin every day.  Taking vitamin and mineral supplements as recommended by your health care provider. What happens during an annual well check? The services and screenings done by your health care provider during your annual well check will depend on your age, overall health, lifestyle risk factors, and family history of disease. Counseling  Your health care provider may ask you questions about your:  Alcohol use.  Tobacco use.  Drug use.  Emotional well-being.  Home and  relationship well-being.  Sexual activity.  Eating habits.  History of falls.  Memory and ability to understand (cognition).  Work and work Statistician.  Reproductive health. Screening  You may have the following tests or measurements:  Height, weight, and BMI.  Blood pressure.  Lipid and cholesterol levels. These may be checked every 5 years, or more frequently if you are over 35 years old.  Skin check.  Lung cancer screening. You may have this screening every year starting at age 61 if you have a 30-pack-year history of smoking and currently smoke or have quit within the past 15 years.  Fecal occult blood test (FOBT) of the stool. You may have this test every year starting at age 21.  Flexible sigmoidoscopy or colonoscopy. You may have a sigmoidoscopy every 5 years or a colonoscopy every 10 years starting at age 65.  Hepatitis C blood test.  Hepatitis B blood test.  Sexually transmitted disease (STD) testing.  Diabetes screening. This is done by checking your blood sugar (glucose) after you have not eaten for a while (fasting). You may have this done every 1-3 years.  Bone density scan. This is done to screen for osteoporosis. You may have this done starting at age 50.  Mammogram. This may be done every 1-2 years. Talk to your health care provider about how often you should have regular mammograms. Talk with your health care provider about your test results, treatment options, and if necessary, the need for more tests. Vaccines  Your health care provider may recommend certain vaccines, such as:  Influenza vaccine. This is recommended every year.  Tetanus, diphtheria, and acellular pertussis (Tdap, Td) vaccine.  You may need a Td booster every 10 years.  Zoster vaccine. You may need this after age 24.  Pneumococcal 13-valent conjugate (PCV13) vaccine. One dose is recommended after age 42.  Pneumococcal polysaccharide (PPSV23) vaccine. One dose is recommended after  age 68. Talk to your health care provider about which screenings and vaccines you need and how often you need them. This information is not intended to replace advice given to you by your health care provider. Make sure you discuss any questions you have with your health care provider. Document Released: 06/30/2015 Document Revised: 02/21/2016 Document Reviewed: 04/04/2015 Elsevier Interactive Patient Education  2017 [place] Prevention in the Home Falls can cause injuries. They can happen to people of all ages. There are many things you can do to make your home safe and to help prevent falls. What can I do on the outside of my home?  Regularly fix the edges of walkways and driveways and fix any cracks.  Remove anything that might make you trip as you walk through a door, such as a raised step or threshold.  Trim any bushes or trees on the path to your home.  Use bright outdoor lighting.  Clear any walking paths of anything that might make someone trip, such as rocks or tools.  Regularly check to see if handrails are loose or broken. Make sure that both sides of any steps have handrails.  Any raised decks and porches should have guardrails on the edges.  Have any leaves, snow, or ice cleared regularly.  Use sand or salt on walking paths during winter.  Clean up any spills in your garage right away. This includes oil or grease spills. What can I do in the bathroom?  Use night lights.  Install grab bars by the toilet and in the tub and shower. Do not use towel bars as grab bars.  Use non-skid mats or decals in the tub or shower.  If you need to sit down in the shower, use a plastic, non-slip stool.  Keep the floor dry. Clean up any water that spills on the floor as soon as it happens.  Remove soap buildup in the tub or shower regularly.  Attach bath mats securely with double-sided non-slip rug tape.  Do not have throw rugs and other things on the floor that can  make you trip. What can I do in the bedroom?  Use night lights.  Make sure that you have a light by your bed that is easy to reach.  Do not use any sheets or blankets that are too big for your bed. They should not hang down onto the floor.  Have a firm chair that has side arms. You can use this for support while you get dressed.  Do not have throw rugs and other things on the floor that can make you trip. What can I do in the kitchen?  Clean up any spills right away.  Avoid walking on wet floors.  Keep items that you use a lot in easy-to-reach places.  If you need to reach something above you, use a strong step stool that has a grab bar.  Keep electrical cords out of the way.  Do not use floor polish or wax that makes floors slippery. If you must use wax, use non-skid floor wax.  Do not have throw rugs and other things on the floor that can make you trip. What can I do with my stairs?  Do not leave any  items on the stairs.  Make sure that there are handrails on both sides of the stairs and use them. Fix handrails that are broken or loose. Make sure that handrails are as long as the stairways.  Check any carpeting to make sure that it is firmly attached to the stairs. Fix any carpet that is loose or worn.  Avoid having throw rugs at the top or bottom of the stairs. If you do have throw rugs, attach them to the floor with carpet tape.  Make sure that you have a light switch at the top of the stairs and the bottom of the stairs. If you do not have them, ask someone to add them for you. What else can I do to help prevent falls?  Wear shoes that:  Do not have high heels.  Have rubber bottoms.  Are comfortable and fit you well.  Are closed at the toe. Do not wear sandals.  If you use a stepladder:  Make sure that it is fully opened. Do not climb a closed stepladder.  Make sure that both sides of the stepladder are locked into place.  Ask someone to hold it for you,  if possible.  Clearly mark and make sure that you can see:  Any grab bars or handrails.  First and last steps.  Where the edge of each step is.  Use tools that help you move around (mobility aids) if they are needed. These include:  Canes.  Walkers.  Scooters.  Crutches.  Turn on the lights when you go into a dark area. Replace any light bulbs as soon as they burn out.  Set up your furniture so you have a clear path. Avoid moving your furniture around.  If any of your floors are uneven, fix them.  If there are any pets around you, be aware of where they are.  Review your medicines with your doctor. Some medicines can make you feel dizzy. This can increase your chance of falling. Ask your doctor what other things that you can do to help prevent falls. This information is not intended to replace advice given to you by your health care provider. Make sure you discuss any questions you have with your health care provider. Document Released: 03/30/2009 Document Revised: 11/09/2015 Document Reviewed: 07/08/2014 Elsevier Interactive Patient Education  2017 [employer].

## 2019-02-19 DIAGNOSIS — N312 Flaccid neuropathic bladder, not elsewhere classified: Secondary | ICD-10-CM | POA: Diagnosis not present

## 2019-02-19 DIAGNOSIS — D3000 Benign neoplasm of unspecified kidney: Secondary | ICD-10-CM | POA: Diagnosis not present

## 2019-02-19 DIAGNOSIS — N3020 Other chronic cystitis without hematuria: Secondary | ICD-10-CM | POA: Diagnosis not present

## 2019-03-10 ENCOUNTER — Non-Acute Institutional Stay: Payer: Medicare Other | Admitting: Internal Medicine

## 2019-03-10 ENCOUNTER — Other Ambulatory Visit: Payer: Self-pay

## 2019-03-10 ENCOUNTER — Encounter: Payer: Self-pay | Admitting: Internal Medicine

## 2019-03-10 VITALS — BP 120/62 | HR 64 | Temp 98.4°F

## 2019-03-10 DIAGNOSIS — M7989 Other specified soft tissue disorders: Secondary | ICD-10-CM

## 2019-03-10 DIAGNOSIS — F0390 Unspecified dementia without behavioral disturbance: Secondary | ICD-10-CM | POA: Diagnosis not present

## 2019-03-10 DIAGNOSIS — L03114 Cellulitis of left upper limb: Secondary | ICD-10-CM | POA: Diagnosis not present

## 2019-03-10 DIAGNOSIS — H903 Sensorineural hearing loss, bilateral: Secondary | ICD-10-CM

## 2019-03-10 DIAGNOSIS — L219 Seborrheic dermatitis, unspecified: Secondary | ICD-10-CM

## 2019-03-10 NOTE — Progress Notes (Signed)
Location:  [facility]   Place of Service:  Clinic (12)  Provider: Jeanette Moffatt L. Renato Gails, D.O., C.M.D.  Code Status: DNR, MOST on file but not in active documents (in historical)  Goals of Care:  Advanced Directives 02/12/2019  Does Patient Have a Medical Advance Directive? Yes  Type of Estate agent of [place];Out of facility DNR (pink MOST or yellow form);Living will  Does patient want to make changes to medical advance directive? No - Patient declined  Copy of Healthcare Power of Attorney in Chart? Yes - validated most recent copy scanned in chart (See row information)  Pre-existing out of facility DNR order (yellow form or pink MOST form) Pink MOST form placed in chart (order not valid for inpatient use);Yellow form placed in chart (order not valid for inpatient use)     Chief Complaint  Patient presents with  . Acute Visit    Lump near armpit and breast on right side x 3 weeks     HPI: Patient is a 85 y.o. female seen today for an acute visit for multiple concerns today: "Lump noted b/w right armpit and breast"--says there for three days, maybe longer?  Story was not consistent.  Not painful.  No pain in breast or masses there or in axilla itself.  Red, swollen, itchy ears and surrounding skin.    Left arm remains a bit tender, swollen and warm despite prior abx on 8/21.  No fever, chills.  Completed one week of doxy at that point.  ROM not good, but is poor for both shoulders and that's not new or different.  Pt has her 89th bday today.  She tells me again how she would not want anything done if we thought she had recurrent colon cancer or cancer of any other parts.  She does not want her life artificially prolonged.  She is excited for a virtual visit with her sons and granddaughter tonight at 7pm.  She wishes her one son's health was better and that he could have some of her many years of life.    Past Medical History:  Diagnosis Date  . Anemia,  unspecified   . Asymptomatic varicose veins   . Carotid bruit    hx of  . Carotid bruit    doppler normal in the past  . Diverticulosis   . Dyslipidemia   . Esophageal reflux   . Female bladder prolapse, acquired 07/17/2010  . Fibroid   . Full incontinence of feces   . Hemorrhoid    1963 surgical excision  . History of colon cancer    Adenocarcinoma of right colon, stage T2, N1.s/p resection/chemotherapy 2002  . HTN (hypertension)   . Hx of colonic polyps    1965 surgical excision  . Hypothyroidism   . Idiopathic osteoporosis   . Idiopathic osteoporosis   . Internal hemorrhoids without mention of complication    bleed easily  . Irritable bowel syndrome   . Melanoma of skin, site unspecified   . MVP (mitral valve prolapse)   . OA (osteoarthritis)    left hip  . Osteoarthrosis, unspecified whether generalized or localized, pelvic region and thigh   . Peripheral neuropathy    hands/ feet  . 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 Spinal stenosis, unspecified region other than cervical   . Vaginal bleeding, abnormal   . Vaginitis, atrophic   . Varicose veins     Past Surgical History:  Procedure Laterality Date  . COLONOSCOPY W/ POLYPECTOMY  2006  3 mm polyp destroyed, diverticulosis  . DILATION AND CURETTAGE OF UTERUS    . ESOPHAGOGASTRODUODENOSCOPY  2009   mild gastritis  . HEMICOLECTOMY  2002   right  . HEMORRHOID SURGERY  1965  . HYSTEROSCOPY    . MELANOMA EXCISION      Allergies  Allergen Reactions  . Amoxil [Amoxicillin] Diarrhea    Has patient had a PCN reaction causing immediate rash, facial/tongue/throat swelling, SOB or lightheadedness with hypotension: no Has patient had a PCN reaction causing severe rash involving mucus membranes or skin necrosis: no Has patient had a PCN reaction that required hospitalization : no Has patient had a PCN reaction occurring within the last 10 years: no If all of the above answers are "NO", then may proceed with  Cephalosporin use.   Parke Simmers Allergy]     sensitivity  . Demerol     unknown  . Meperidine Hcl Other (See Comments)    Drop in blood pressure  . Infed [Iron Dextran] Rash    Became very red over face, scalp, and arms  . Neosporin [Neomycin-Bacitracin Zn-Polymyx] Rash    unknown    Outpatient Encounter Medications as of 11-20-202020  Medication Sig  . acetaminophen (TYLENOL) 325 MG tablet Take 650 mg by mouth 2 (two) times daily.  Marland Kitchen antiseptic oral rinse (BIOTENE) LIQD 15 mLs by Mouth Rinse route 3 (three) times daily.  . cholecalciferol (VITAMIN D) 1000 UNITS tablet Take 1 tablet (1,000 Units total) by mouth daily.  . Cranberry (ELLURA PO) Take 36 mg by mouth daily.  . Cranberry 400 MG CAPS Take 1 capsule by mouth 2 (two) times daily.  . Eyelid Cleansers (OCUSOFT EYELID CLEANSING) PADS Apply topically as needed.  . gabapentin (NEURONTIN) 100 MG capsule Take 100 mg by mouth at bedtime.  . hydrocortisone (ANUSOL-HC) 2.5 % rectal cream Place 1 application rectally daily as needed for hemorrhoids or itching.   . hydrocortisone 2.5 % cream Apply 1 application topically 2 (two) times daily as needed.   Marland Kitchen levothyroxine (SYNTHROID, LEVOTHROID) 75 MCG tablet TAKE 1 TABLET DAILY BEFORE BREAKFAST FOR THYROID.  . Melatonin 5 MG TABS Take by mouth at bedtime as needed.   . methenamine (HIPREX) 1 g tablet Take 1 g by mouth at bedtime.  . methylcellulose (CITRUCEL FIBERSHAKE) oral powder Take 1 packet by mouth daily as needed.   . miconazole (ZEASORB-AF) 2 % powder Apply topically daily. Groin area  . nystatin (NYSTATIN) powder Apply topically 2 (two) times daily. Apply to inguinal rash  . phenazopyridine (PYRIDIUM) 200 MG tablet Take 200 mg by mouth 3 (three) times daily as needed for pain.  Bertram Gala Glycol-Propyl Glycol (SYSTANE) 0.4-0.3 % SOLN Place 2 drops into the right eye 2 (two) times daily.  . potassium chloride SA (K-DUR) 20 MEQ tablet Take 20 mEq by mouth daily.  . Probiotic  Product (ALIGN) 4 MG CAPS Take 1 tablet by mouth daily.    . vitamin C (ASCORBIC ACID) 500 MG tablet Take 500 mg by mouth daily.  . [DISCONTINUED] Cranberry (ELLURA PO) Take 1 capsule by mouth 2 (two) times daily.   . [DISCONTINUED] ketoconazole (NIZORAL) 2 % cream Apply 1 application topically as needed for irritation.  . [DISCONTINUED] Potassium Chloride ER 20 MEQ TBCR Take 20 mEq by mouth 2 (two) times daily. (Patient taking differently: Take 20 mEq by mouth daily. )   No facility-administered encounter medications on file as of 11-20-202020.     Review of Systems:  Review  of Systems  Constitutional: Negative for chills, fever and malaise/fatigue.  HENT: Positive for hearing loss.   Eyes: Positive for blurred vision.  Respiratory: Negative for cough and shortness of breath.   Cardiovascular: Negative for chest pain, palpitations and leg swelling.  Gastrointestinal: Negative for abdominal pain, blood in stool, constipation, diarrhea and melena.       Constipation now controlled  Genitourinary: Negative for dysuria.       Gets I/o caths for retention  Musculoskeletal: Negative for falls and joint pain.       Walks with walker, has foot drop with afo in place  Skin: Positive for itching and rash.       Of bilateral ears and surrounding skin; swollen area b/w right arm and breast; redness, warmth, swelling left upper arm  Neurological: Negative for dizziness and loss of consciousness.  Psychiatric/Behavioral: Positive for memory loss. Negative for depression. The patient is nervous/anxious. The patient does not have insomnia.     Health Maintenance  Topic Date Due  . INFLUENZA VACCINE  01/16/2019  . TETANUS/TDAP  10/08/2021  . DEXA SCAN  Completed  . PNA vac Low Risk Adult  Completed    Physical Exam: Vitals:   Jan 10, 2019 1309  BP: 120/62  Pulse: 64  Temp: 98.4 F (36.9 C)  TempSrc: Oral  SpO2: 96%   There is no height or weight on file to calculate BMI. Physical Exam Vitals  signs reviewed.  Constitutional:      General: She is not in acute distress.    Appearance: Normal appearance. She is not toxic-appearing.  HENT:     Head: Normocephalic and atraumatic.     Right Ear: Tympanic membrane and ear canal normal. There is no impacted cerumen.     Left Ear: Tympanic membrane and ear canal normal. There is no impacted cerumen.     Ears:     Comments: Hearing aids, very HOH; external ears both red, swollen, with dry scaly skin on them and around them    Nose: Nose normal.  Eyes:     Extraocular Movements: Extraocular movements intact.     Pupils: Pupils are equal, round, and reactive to light.     Comments: Right eye with slight surrounding erythema (?itching ears and then touching eye)  Cardiovascular:     Rate and Rhythm: Normal rate and regular rhythm.     Pulses: Normal pulses.     Heart sounds: Normal heart sounds.  Pulmonary:     Effort: Pulmonary effort is normal.     Breath sounds: Normal breath sounds.  Chest:     Chest wall: No mass, lacerations or deformity.     Breasts: Breasts are symmetrical.        Right: Normal.        Left: Normal.    Abdominal:     General: Bowel sounds are normal.  Musculoskeletal: Normal range of motion.     Right lower leg: No edema.     Left lower leg: No edema.     Comments: Decreased abduction and flexion of both shoulders; b/l foot drop with AFOs in place, came down in manual wheelchair today rather than walking with walker  Lymphadenopathy:     Upper Body:     Right upper body: No supraclavicular or axillary adenopathy.     Left upper body: No supraclavicular or axillary adenopathy.  Skin:    General: Skin is warm and dry.     Findings: Erythema present.  Comments: Swollen area right anterior axilla where pectoralis tendon joins shoulder--nontender; left upper medial arm with about 2-3 inches of warmth, tenderness and erythema; both ears with redness, warmth, swelling, but itchy not tender, right eye  slightly erythematous  Neurological:     Mental Status: She is alert.     Labs reviewed: Basic Metabolic Panel: No results for input(s): NA, K, CL, CO2, GLUCOSE, BUN, CREATININE, CALCIUM, MG, PHOS, TSH in the last 8760 hours. Liver Function Tests: No results for input(s): AST, ALT, ALKPHOS, BILITOT, PROT, ALBUMIN in the last 8760 hours. No results for input(s): LIPASE, AMYLASE in the last 8760 hours. No results for input(s): AMMONIA in the last 8760 hours. CBC: No results for input(s): WBC, NEUTROABS, HGB, HCT, MCV, PLT in the last 8760 hours. Lipid Panel: No results for input(s): CHOL, HDL, LDLCALC, TRIG, CHOLHDL, LDLDIRECT in the last 8760 hours. Lab Results  Component Value Date   HGBA1C 5.9 12/02/2006    Procedures since last visit: No results found.  Assessment/Plan 1. Right axillary swelling -seems like this is very superficial, no breast abnormality found -? Fatty deposit -pt does not want further investigation at her age of 59 and comfort-based goals   2. Left arm cellulitis -will tx again with doxy but for 10 days  3. Dementia without behavioral disturbance, unspecified dementia type (HCC) -progressing gradually, but still quite aware of her situation and able to contribute to healthcare decision making -severely HOH--contributes considerably and also does not see well  4. Sensorineural hearing loss (SNHL) of both ears -is severe, cont hearing aids  5.  Seborrheic dermatatis -treat dermatitis of ears and surrounding skin with ketoconazole cream bid for 4 wks -avoid touching eyes  Labs/tests ordered: no new Next appt:  04/21/2019 and prn  Marian Grandt L. Joannah Gitlin, D.O. Geriatrics [facility] [address], [place] [address] Cell Phone (Mon-Fri 8am-5pm):  [phone] On Call:  [phone] & follow prompts after 5pm & weekends Office Phone:  [phone] Office Fax:  [phone]

## 2019-03-17 DIAGNOSIS — Z9189 Other specified personal risk factors, not elsewhere classified: Secondary | ICD-10-CM | POA: Diagnosis not present

## 2019-03-17 LAB — NOVEL CORONAVIRUS, NAA: SARS-CoV-2, NAA: NOT DETECTED

## 2019-03-25 ENCOUNTER — Encounter: Payer: Self-pay | Admitting: Gynecology

## 2019-03-25 DIAGNOSIS — Z20828 Contact with and (suspected) exposure to other viral communicable diseases: Secondary | ICD-10-CM | POA: Diagnosis not present

## 2019-03-26 ENCOUNTER — Other Ambulatory Visit: Payer: Self-pay | Admitting: *Deleted

## 2019-03-27 LAB — NOVEL CORONAVIRUS, NAA: SARS-CoV-2, NAA: NOT DETECTED

## 2019-03-31 DIAGNOSIS — Z9189 Other specified personal risk factors, not elsewhere classified: Secondary | ICD-10-CM | POA: Diagnosis not present

## 2019-04-06 DIAGNOSIS — Z9189 Other specified personal risk factors, not elsewhere classified: Secondary | ICD-10-CM | POA: Diagnosis not present

## 2019-04-07 ENCOUNTER — Encounter: Payer: Self-pay | Admitting: Internal Medicine

## 2019-04-13 DIAGNOSIS — Z9189 Other specified personal risk factors, not elsewhere classified: Secondary | ICD-10-CM | POA: Diagnosis not present

## 2019-04-13 DIAGNOSIS — Z20828 Contact with and (suspected) exposure to other viral communicable diseases: Secondary | ICD-10-CM | POA: Diagnosis not present

## 2019-04-20 DIAGNOSIS — Z9189 Other specified personal risk factors, not elsewhere classified: Secondary | ICD-10-CM | POA: Diagnosis not present

## 2019-04-20 DIAGNOSIS — Z20828 Contact with and (suspected) exposure to other viral communicable diseases: Secondary | ICD-10-CM | POA: Diagnosis not present

## 2019-04-21 ENCOUNTER — Non-Acute Institutional Stay: Payer: Medicare Other | Admitting: Internal Medicine

## 2019-04-21 ENCOUNTER — Encounter: Payer: Self-pay | Admitting: Internal Medicine

## 2019-04-21 ENCOUNTER — Other Ambulatory Visit: Payer: Self-pay

## 2019-04-21 VITALS — BP 122/74 | HR 72 | Temp 97.7°F | Ht 59.0 in | Wt 106.0 lb

## 2019-04-21 DIAGNOSIS — M79622 Pain in left upper arm: Secondary | ICD-10-CM

## 2019-04-21 DIAGNOSIS — H353230 Exudative age-related macular degeneration, bilateral, stage unspecified: Secondary | ICD-10-CM

## 2019-04-21 DIAGNOSIS — M7989 Other specified soft tissue disorders: Secondary | ICD-10-CM

## 2019-04-21 DIAGNOSIS — H04121 Dry eye syndrome of right lacrimal gland: Secondary | ICD-10-CM | POA: Diagnosis not present

## 2019-04-21 DIAGNOSIS — L219 Seborrheic dermatitis, unspecified: Secondary | ICD-10-CM | POA: Diagnosis not present

## 2019-04-21 DIAGNOSIS — H903 Sensorineural hearing loss, bilateral: Secondary | ICD-10-CM | POA: Diagnosis not present

## 2019-04-21 NOTE — Progress Notes (Signed)
Location:  [facility] Room Number: 525-A Place of Service:  Clinic (12)  Provider: Marvie Calender L. Mariea Clonts, D.O., C.M.D.  Code Status: DNR Goals of Care:  Advanced Directives 04/21/2019  Does Patient Have a Medical Advance Directive? Yes  Type of Paramedic of [place];Living will;Out of facility DNR (pink MOST or yellow form)  Does patient want to make changes to medical advance directive? No - Patient declined  Copy of [place] in Chart? Yes - validated most recent copy scanned in chart (See row information)  Pre-existing out of facility DNR order (yellow form or pink MOST form) Pink MOST form placed in chart (order not valid for inpatient use);Yellow form placed in chart (order not valid for inpatient use)     Chief Complaint  Patient presents with  . Follow-up    6 week follow-up     HPI: Patient is a 85 y.o. female seen today for medical management of chronic diseases.   Her hearing seems to be worse each visit.  I had to write out my questions and statements for her today.  Left arm still hurts more than right.  Did have celluitis that was treated and has resolved.  Pain seems deeper.  She did not want any imaging done.  Legs hurt chronicallly all of the time due to neuropathy and lumbar spine disease.  On gabapentin at hs.    Her vision also has continued to get worse--can only see me within a couple feet of her face.  She has redness of her right eye and some drooping open of the lower lid.  She denies pain in the eye.  Last time, she'd had significant redness and scaling of both ears.  I treated her with ketoconazole cream for 4 wks but it ended last month some time.  I did not hear if it helped and pt cannot tell me, but ears still appear red and scaly.    Past Medical History:  Diagnosis Date  . Anemia, unspecified   . Asymptomatic varicose veins   . Carotid bruit    hx of  . Carotid bruit    doppler normal in the past  . Diverticulosis   . Dyslipidemia   . Esophageal reflux   . Female bladder prolapse, acquired 07/17/2010  . Fibroid   . Full incontinence of feces   . Hemorrhoid    1963 surgical excision  . History of colon cancer    Adenocarcinoma of right colon, stage T2, N1.s/p resection/chemotherapy 2002  . HTN (hypertension)   . Hx of colonic polyps    1965 surgical excision  . Hypothyroidism   . Idiopathic osteoporosis   . Idiopathic osteoporosis   . Internal hemorrhoids without mention of complication    bleed easily  . Irritable bowel syndrome   . Melanoma of skin, site unspecified   . MVP (mitral valve prolapse)   . OA (osteoarthritis)    left hip  . Osteoarthrosis, unspecified whether generalized or localized, pelvic region and thigh   . Peripheral neuropathy    hands/ feet  . 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 Spinal stenosis, unspecified region other than cervical   . Vaginal bleeding, abnormal   . Vaginitis, atrophic   . Varicose veins     Past Surgical History:  Procedure Laterality Date  . COLONOSCOPY W/ POLYPECTOMY  2006   3 mm polyp destroyed, diverticulosis  . DILATION AND CURETTAGE OF UTERUS    . ESOPHAGOGASTRODUODENOSCOPY  2009   mild gastritis  . HEMICOLECTOMY  2002   right  . [place]  . HYSTEROSCOPY    . MELANOMA EXCISION      Allergies  Allergen Reactions  . Amoxil [Amoxicillin] Diarrhea    Has patient had a PCN reaction causing immediate rash, facial/tongue/throat swelling, SOB or lightheadedness with hypotension: no Has patient had a PCN reaction causing severe rash involving mucus membranes or skin necrosis: no Has patient had a PCN reaction that required hospitalization : no Has patient had a PCN reaction occurring within the last 10 years: no If all of the above answers are "NO", then may proceed with Cephalosporin use.   Otho Darner Allergy]     sensitivity  . Demerol     unknown  .  Meperidine Hcl Other (See Comments)    Drop in blood pressure  . Infed [Iron Dextran] Rash    Became very red over face, scalp, and arms  . Neosporin [Neomycin-Bacitracin Zn-Polymyx] Rash    unknown    Outpatient Encounter Medications as of 04/21/2019  Medication Sig  . acetaminophen (TYLENOL) 325 MG tablet Take 650 mg by mouth as needed.   Marland Kitchen acetaminophen (TYLENOL) 650 MG CR tablet Take 1,300 mg by mouth 2 (two) times daily.  Marland Kitchen antiseptic oral rinse (BIOTENE) LIQD 15 mLs by Mouth Rinse route 3 (three) times daily.  . cholecalciferol (VITAMIN D) 1000 UNITS tablet Take 1 tablet (1,000 Units total) by mouth daily.  . Cranberry (ELLURA PO) Take 36 mg by mouth daily.  . Cranberry 400 MG CAPS Take 1 capsule by mouth 2 (two) times daily.  . Eyelid Cleansers (OCUSOFT EYELID CLEANSING) PADS Apply topically as needed.  . gabapentin (NEURONTIN) 100 MG capsule Take 100 mg by mouth at bedtime.  . hydrocortisone (ANUSOL-HC) 2.5 % rectal cream Place 1 application rectally daily as needed for hemorrhoids or itching.   . levothyroxine (SYNTHROID, LEVOTHROID) 75 MCG tablet TAKE 1 TABLET DAILY BEFORE BREAKFAST FOR THYROID.  . Melatonin 5 MG TABS Take by mouth at bedtime as needed.   . methenamine (HIPREX) 1 g tablet Take 1 g by mouth at bedtime.  . methylcellulose (CITRUCEL FIBERSHAKE) oral powder Take 1 packet by mouth daily as needed.   . miconazole (ZEASORB-AF) 2 % powder Apply topically daily. Groin area  . nystatin (NYSTATIN) powder Apply topically 2 (two) times daily. Apply to inguinal rash  . phenazopyridine (PYRIDIUM) 200 MG tablet Take 200 mg by mouth 3 (three) times daily as needed for pain.  Vladimir Faster Glycol-Propyl Glycol (SYSTANE) 0.4-0.3 % SOLN Place 2 drops into the right eye 2 (two) times daily.  . potassium chloride SA (K-DUR) 20 MEQ tablet Take 20 mEq by mouth daily.  . Probiotic Product (ALIGN) 4 MG CAPS Take 1 tablet by mouth daily.    . vitamin C (ASCORBIC ACID) 500 MG tablet Take 500  mg by mouth daily.  . [DISCONTINUED] Cranberry (ELLURA PO) Take 36 mg by mouth daily.  . [DISCONTINUED] hydrocortisone 2.5 % cream Apply 1 application topically 2 (two) times daily as needed.    No facility-administered encounter medications on file as of 04/21/2019.     Review of Systems:  Review of Systems  Constitutional: Positive for malaise/fatigue. Negative for chills and fever.  HENT: Positive for hearing loss.   Eyes: Positive for blurred vision.  Respiratory: Negative for cough and shortness of breath.   Cardiovascular: Negative for chest pain, palpitations and leg swelling.  Gastrointestinal: Positive for  constipation. Negative for abdominal pain, blood in stool, diarrhea and melena.  Genitourinary: Positive for frequency.  Musculoskeletal: Positive for joint pain. Negative for falls.       Left shoulder and upper arm; right also a little painful  Skin: Positive for itching and rash.  Neurological: Positive for tingling and sensory change. Negative for loss of consciousness.  Endo/Heme/Allergies: Bruises/bleeds easily.  Psychiatric/Behavioral: Positive for memory loss. Negative for depression. The patient is nervous/anxious. The patient does not have insomnia.     Health Maintenance  Topic Date Due  . INFLUENZA VACCINE  01/16/2019  . TETANUS/TDAP  10/08/2021  . DEXA SCAN  Completed  . PNA vac Low Risk Adult  Completed    Physical Exam: Vitals:   04/21/19 1346  BP: 122/74  Pulse: 72  Temp: 97.7 F (36.5 C)  TempSrc: Oral  SpO2: 93%  Weight: 106 lb (48.1 kg)  Height: 4\' 11"  (1.499 m)   Body mass index is 21.41 kg/m. Physical Exam Vitals signs reviewed.  Constitutional:      General: She is not in acute distress.    Appearance: She is not toxic-appearing.     Comments: Petite stature, thin female  HENT:     Head: Normocephalic and atraumatic.     Ears:     Comments: Bilateral ears swollen, red, scaly; right ear with increased dry cerumen but also some  dried blood and irritation in canal so not flushed due to this; left clear with pink tm and light reflex Eyes:     Comments: Right eye with drooping lower lid and erythema of this lid and around entire eye with bloody appearance; glasses  Pulmonary:     Effort: Pulmonary effort is normal.     Breath sounds: Normal breath sounds.     Comments: Seems to get dyspneic with minimal activity (like pulling sweater on and off) Abdominal:     General: Bowel sounds are normal.     Palpations: Abdomen is soft.  Musculoskeletal:     Right lower leg: No edema.     Left lower leg: No edema.     Comments: Left arm abduction and internal rotation limited; tender over upper arm with crepitus in that area; right swollen area in axilla has resolved with slight tenderness remaining there  Skin:    General: Skin is warm and dry.     Capillary Refill: Capillary refill takes less than 2 seconds.  Neurological:     Mental Status: She is alert.     Gait: Gait abnormal.     Comments: Left foot drop with afo in place; chronically slightly garbled speech  Psychiatric:        Mood and Affect: Mood normal.     Assessment/Plan 1. Seborrheic dermatitis Restart the bid ketoconazole cream for bilateral ears and surrounding skin--unclear if improved while getting--pt thinks it was not applied consistently  2. Left upper arm pain She did not want imaging done of her left upper arm.  It has crepitus in it.  Suspect some scar tissue from prior injuries.  Has poor rom.  3. Dry eye syndrome of right eye Also has severe redness of right eye and has ptosis of the lower lid--so I suspect her eye is drying out causing the redness--opted to start systane gel at hs -says she's been itching her eyes  4. Sensorineural hearing loss (SNHL) of both ears -remains severe; some cerumen in right ear, but canal appeared too irritated to tolerate flushing -cannot be improved  any further with hearing aids  5. Bilateral exudative  age-related macular degeneration, unspecified stage (HCC) -was no longer benefiting from injections in right eye -sees mostly through left, but still can only see close up and large print to read  6. Right axillary swelling -not clear what this was, has resolved  Labs/tests ordered:  No new  Next appt:  07/28/2019  Catrice Zuleta L. Nakiea Metzner, D.O. [facility] [address], [place] [address] Cell Phone (Mon-Fri 8am-5pm):  [phone] On Call:  [phone] & follow prompts after 5pm & weekends Office Phone:  [phone] Office Fax:  [phone]

## 2019-04-28 DIAGNOSIS — Z9189 Other specified personal risk factors, not elsewhere classified: Secondary | ICD-10-CM | POA: Diagnosis not present

## 2019-04-28 DIAGNOSIS — Z20828 Contact with and (suspected) exposure to other viral communicable diseases: Secondary | ICD-10-CM | POA: Diagnosis not present

## 2019-05-04 DIAGNOSIS — Z9189 Other specified personal risk factors, not elsewhere classified: Secondary | ICD-10-CM | POA: Diagnosis not present

## 2019-05-12 DIAGNOSIS — Z20828 Contact with and (suspected) exposure to other viral communicable diseases: Secondary | ICD-10-CM | POA: Diagnosis not present

## 2019-05-12 DIAGNOSIS — Z9189 Other specified personal risk factors, not elsewhere classified: Secondary | ICD-10-CM | POA: Diagnosis not present

## 2019-06-14 DIAGNOSIS — Z20828 Contact with and (suspected) exposure to other viral communicable diseases: Secondary | ICD-10-CM | POA: Diagnosis not present

## 2019-06-14 DIAGNOSIS — Z9189 Other specified personal risk factors, not elsewhere classified: Secondary | ICD-10-CM | POA: Diagnosis not present

## 2019-06-25 DIAGNOSIS — Z20828 Contact with and (suspected) exposure to other viral communicable diseases: Secondary | ICD-10-CM | POA: Diagnosis not present

## 2019-06-25 DIAGNOSIS — Z9189 Other specified personal risk factors, not elsewhere classified: Secondary | ICD-10-CM | POA: Diagnosis not present

## 2019-07-05 DIAGNOSIS — Z20828 Contact with and (suspected) exposure to other viral communicable diseases: Secondary | ICD-10-CM | POA: Diagnosis not present

## 2019-07-05 DIAGNOSIS — Z9189 Other specified personal risk factors, not elsewhere classified: Secondary | ICD-10-CM | POA: Diagnosis not present

## 2019-07-06 ENCOUNTER — Encounter: Payer: Self-pay | Admitting: Internal Medicine

## 2019-07-06 DIAGNOSIS — N390 Urinary tract infection, site not specified: Secondary | ICD-10-CM | POA: Diagnosis not present

## 2019-07-06 DIAGNOSIS — R319 Hematuria, unspecified: Secondary | ICD-10-CM | POA: Diagnosis not present

## 2019-07-07 ENCOUNTER — Telehealth: Payer: Self-pay | Admitting: Internal Medicine

## 2019-07-07 MED ORDER — CEPHALEXIN 500 MG PO CAPS: 500.0000 mg | ORAL_CAPSULE | Freq: Two times a day (BID) | ORAL | 0 refills | Status: DC

## 2019-07-07 NOTE — Telephone Encounter (Signed)
Patient has PCN allergy causing diarrhea.  Will try to treat UTI with keflex instead.

## 2019-07-08 DIAGNOSIS — Z20828 Contact with and (suspected) exposure to other viral communicable diseases: Secondary | ICD-10-CM | POA: Diagnosis not present

## 2019-07-08 DIAGNOSIS — Z9189 Other specified personal risk factors, not elsewhere classified: Secondary | ICD-10-CM | POA: Diagnosis not present

## 2019-07-09 ENCOUNTER — Encounter: Payer: Self-pay | Admitting: Internal Medicine

## 2019-07-12 DIAGNOSIS — Z20828 Contact with and (suspected) exposure to other viral communicable diseases: Secondary | ICD-10-CM | POA: Diagnosis not present

## 2019-07-12 DIAGNOSIS — Z9189 Other specified personal risk factors, not elsewhere classified: Secondary | ICD-10-CM | POA: Diagnosis not present

## 2019-07-13 DIAGNOSIS — R278 Other lack of coordination: Secondary | ICD-10-CM | POA: Diagnosis not present

## 2019-07-13 DIAGNOSIS — M6389 Disorders of muscle in diseases classified elsewhere, multiple sites: Secondary | ICD-10-CM | POA: Diagnosis not present

## 2019-07-13 DIAGNOSIS — N390 Urinary tract infection, site not specified: Secondary | ICD-10-CM | POA: Diagnosis not present

## 2019-07-13 DIAGNOSIS — R2689 Other abnormalities of gait and mobility: Secondary | ICD-10-CM | POA: Diagnosis not present

## 2019-07-13 DIAGNOSIS — Z9181 History of falling: Secondary | ICD-10-CM | POA: Diagnosis not present

## 2019-07-14 DIAGNOSIS — M6389 Disorders of muscle in diseases classified elsewhere, multiple sites: Secondary | ICD-10-CM | POA: Diagnosis not present

## 2019-07-14 DIAGNOSIS — Z9181 History of falling: Secondary | ICD-10-CM | POA: Diagnosis not present

## 2019-07-14 DIAGNOSIS — N390 Urinary tract infection, site not specified: Secondary | ICD-10-CM | POA: Diagnosis not present

## 2019-07-14 DIAGNOSIS — R2689 Other abnormalities of gait and mobility: Secondary | ICD-10-CM | POA: Diagnosis not present

## 2019-07-14 DIAGNOSIS — R278 Other lack of coordination: Secondary | ICD-10-CM | POA: Diagnosis not present

## 2019-07-15 DIAGNOSIS — R278 Other lack of coordination: Secondary | ICD-10-CM | POA: Diagnosis not present

## 2019-07-15 DIAGNOSIS — Z9181 History of falling: Secondary | ICD-10-CM | POA: Diagnosis not present

## 2019-07-15 DIAGNOSIS — M6389 Disorders of muscle in diseases classified elsewhere, multiple sites: Secondary | ICD-10-CM | POA: Diagnosis not present

## 2019-07-15 DIAGNOSIS — N390 Urinary tract infection, site not specified: Secondary | ICD-10-CM | POA: Diagnosis not present

## 2019-07-15 DIAGNOSIS — R2689 Other abnormalities of gait and mobility: Secondary | ICD-10-CM | POA: Diagnosis not present

## 2019-07-19 DIAGNOSIS — Z20828 Contact with and (suspected) exposure to other viral communicable diseases: Secondary | ICD-10-CM | POA: Diagnosis not present

## 2019-07-19 DIAGNOSIS — R278 Other lack of coordination: Secondary | ICD-10-CM | POA: Diagnosis not present

## 2019-07-19 DIAGNOSIS — Z9189 Other specified personal risk factors, not elsewhere classified: Secondary | ICD-10-CM | POA: Diagnosis not present

## 2019-07-19 DIAGNOSIS — R2689 Other abnormalities of gait and mobility: Secondary | ICD-10-CM | POA: Diagnosis not present

## 2019-07-19 DIAGNOSIS — M6389 Disorders of muscle in diseases classified elsewhere, multiple sites: Secondary | ICD-10-CM | POA: Diagnosis not present

## 2019-07-19 DIAGNOSIS — N390 Urinary tract infection, site not specified: Secondary | ICD-10-CM | POA: Diagnosis not present

## 2019-07-19 DIAGNOSIS — Z9181 History of falling: Secondary | ICD-10-CM | POA: Diagnosis not present

## 2019-07-22 DIAGNOSIS — R2689 Other abnormalities of gait and mobility: Secondary | ICD-10-CM | POA: Diagnosis not present

## 2019-07-22 DIAGNOSIS — N390 Urinary tract infection, site not specified: Secondary | ICD-10-CM | POA: Diagnosis not present

## 2019-07-22 DIAGNOSIS — M6389 Disorders of muscle in diseases classified elsewhere, multiple sites: Secondary | ICD-10-CM | POA: Diagnosis not present

## 2019-07-22 DIAGNOSIS — Z9181 History of falling: Secondary | ICD-10-CM | POA: Diagnosis not present

## 2019-07-22 DIAGNOSIS — R278 Other lack of coordination: Secondary | ICD-10-CM | POA: Diagnosis not present

## 2019-07-28 ENCOUNTER — Other Ambulatory Visit: Payer: Self-pay

## 2019-07-28 ENCOUNTER — Encounter: Payer: Self-pay | Admitting: Internal Medicine

## 2019-07-28 ENCOUNTER — Non-Acute Institutional Stay: Payer: Medicare Other | Admitting: Internal Medicine

## 2019-07-28 VITALS — BP 138/88 | HR 96 | Temp 97.3°F | Ht <= 58 in | Wt 105.5 lb

## 2019-07-28 DIAGNOSIS — F0390 Unspecified dementia without behavioral disturbance: Secondary | ICD-10-CM

## 2019-07-28 DIAGNOSIS — K5909 Other constipation: Secondary | ICD-10-CM | POA: Diagnosis not present

## 2019-07-28 DIAGNOSIS — M6389 Disorders of muscle in diseases classified elsewhere, multiple sites: Secondary | ICD-10-CM | POA: Diagnosis not present

## 2019-07-28 DIAGNOSIS — Z9181 History of falling: Secondary | ICD-10-CM | POA: Diagnosis not present

## 2019-07-28 DIAGNOSIS — R531 Weakness: Secondary | ICD-10-CM | POA: Diagnosis not present

## 2019-07-28 DIAGNOSIS — N390 Urinary tract infection, site not specified: Secondary | ICD-10-CM | POA: Diagnosis not present

## 2019-07-28 DIAGNOSIS — H353230 Exudative age-related macular degeneration, bilateral, stage unspecified: Secondary | ICD-10-CM

## 2019-07-28 DIAGNOSIS — Z7189 Other specified counseling: Secondary | ICD-10-CM | POA: Diagnosis not present

## 2019-07-28 DIAGNOSIS — R278 Other lack of coordination: Secondary | ICD-10-CM | POA: Diagnosis not present

## 2019-07-28 DIAGNOSIS — H903 Sensorineural hearing loss, bilateral: Secondary | ICD-10-CM | POA: Diagnosis not present

## 2019-07-28 DIAGNOSIS — R2689 Other abnormalities of gait and mobility: Secondary | ICD-10-CM | POA: Diagnosis not present

## 2019-07-28 DIAGNOSIS — R339 Retention of urine, unspecified: Secondary | ICD-10-CM

## 2019-07-28 DIAGNOSIS — Z23 Encounter for immunization: Secondary | ICD-10-CM | POA: Diagnosis not present

## 2019-07-28 NOTE — Progress Notes (Signed)
Location:  Occupational psychologist of Service:  Clinic (12)  Provider: Travaughn Vue L. Mariea Clonts, D.O., C.M.D.  Code Status: DNR, MOST UPDATED TODAY Goals of Care:  Advanced Directives 07/28/2019  Does Patient Have a Medical Advance Directive? Yes  Type of Advance Directive Out of facility DNR (pink MOST or yellow form)  Does patient want to make changes to medical advance directive? No - Patient declined  Copy of [place] in Chart? -  Pre-existing out of facility DNR order (yellow form or pink MOST form) -   Chief Complaint  Patient presents with  . Medical Management of Chronic Issues    3 month follow up     HPI: Patient is a 86 y.o. female seen today for medical management of chronic diseases and review of her MOST form which we need to update.    Staff have been concerned about her increased care needs.  84 she's old.  She can't hear and can't see.  She says she's lived too long.    She does not really feel bad.  She does not sleep well b/c of having to get up to go to the bathroom.  Cannot go in the daytime.  Goes to bed about 9:15pm.  CNA leaves at 10pm.  Sometimes, falls asleep.  Nurse comes at 79 or 1 to do I/O cath.  Wakes up all through the night.  Sometimes sleeps better after 12, but wakes up 2-3x.    She got new shoes that are more flexible and comfortable just today.   Past Medical History:  Diagnosis Date  . Anemia, unspecified   . Asymptomatic varicose veins   . Carotid bruit    hx of  . Carotid bruit    doppler normal in the past  . Diverticulosis   . Dyslipidemia   . Esophageal reflux   . Female bladder prolapse, acquired 07/17/2010  . Fibroid   . Full incontinence of feces   . Hemorrhoid    1963 surgical excision  . History of colon cancer    Adenocarcinoma of right colon, stage T2, N1.s/p resection/chemotherapy 2002  . HTN (hypertension)   . Hx of colonic polyps    1965 surgical excision  . Hypothyroidism   .  Idiopathic osteoporosis   . Idiopathic osteoporosis   . Internal hemorrhoids without mention of complication    bleed easily  . Irritable bowel syndrome   . Melanoma of skin, site unspecified   . MVP (mitral valve prolapse)   . OA (osteoarthritis)    left hip  . Osteoarthrosis, unspecified whether generalized or localized, pelvic region and thigh   . Peripheral neuropathy    hands/ feet  . 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 Spinal stenosis, unspecified region other than cervical   . Vaginal bleeding, abnormal   . Vaginitis, atrophic   . Varicose veins     Past Surgical History:  Procedure Laterality Date  . COLONOSCOPY W/ POLYPECTOMY  2006   3 mm polyp destroyed, diverticulosis  . DILATION AND CURETTAGE OF UTERUS    . ESOPHAGOGASTRODUODENOSCOPY  2009   mild gastritis  . HEMICOLECTOMY  2002   right  . [place]  . HYSTEROSCOPY    . MELANOMA EXCISION      Allergies  Allergen Reactions  . Amoxil [Amoxicillin] Diarrhea    Has patient had a PCN reaction causing immediate rash, facial/tongue/throat swelling, SOB or lightheadedness with hypotension: no Has patient had a PCN  reaction causing severe rash involving mucus membranes or skin necrosis: no Has patient had a PCN reaction that required hospitalization : no Has patient had a PCN reaction occurring within the last 10 years: no If all of the above answers are "NO", then may proceed with Cephalosporin use.   Otho Darner Allergy]     sensitivity  . Demerol     unknown  . Meperidine Hcl Other (See Comments)    Drop in blood pressure  . Infed [Iron Dextran] Rash    Became very red over face, scalp, and arms  . Neosporin [Neomycin-Bacitracin Zn-Polymyx] Rash    unknown    Outpatient Encounter Medications as of 07/28/2019  Medication Sig  . acetaminophen (TYLENOL) 325 MG tablet Take 650 mg by mouth as needed.   Marland Kitchen acetaminophen (TYLENOL) 650 MG CR tablet Take 1,300 mg by mouth 2 (two) times daily.    Marland Kitchen antiseptic oral rinse (BIOTENE) LIQD 15 mLs by Mouth Rinse route 3 (three) times daily.  . cephALEXin (KEFLEX) 500 MG capsule Take 1 capsule (500 mg total) by mouth 2 (two) times daily.  . cholecalciferol (VITAMIN D) 1000 UNITS tablet Take 1 tablet (1,000 Units total) by mouth daily.  . Cranberry (ELLURA PO) Take 36 mg by mouth daily.  . Cranberry 400 MG CAPS Take 1 capsule by mouth 2 (two) times daily.  . Eyelid Cleansers (OCUSOFT EYELID CLEANSING) PADS Apply topically as needed.  . gabapentin (NEURONTIN) 100 MG capsule Take 100 mg by mouth at bedtime.  . hydrocortisone (ANUSOL-HC) 2.5 % rectal cream Place 1 application rectally daily as needed for hemorrhoids or itching.   . hydrocortisone (ANUSOL-HC) 25 MG suppository Place 25 mg rectally 2 (two) times daily.  Marland Kitchen ketoconazole (NIZORAL) 2 % cream Apply 1 application topically 2 (two) times daily as needed for irritation. Apply b/l  to ears and surrounding skin ongoing for dermatitis  . levothyroxine (SYNTHROID, LEVOTHROID) 75 MCG tablet TAKE 1 TABLET DAILY BEFORE BREAKFAST FOR THYROID.  . Melatonin 5 MG TABS Take by mouth at bedtime as needed.   . methenamine (HIPREX) 1 g tablet Take 1 g by mouth at bedtime.  . methylcellulose (CITRUCEL FIBERSHAKE) oral powder Take 1 packet by mouth daily as needed.   . miconazole (ZEASORB-AF) 2 % powder Apply topically daily. Groin area  . nystatin (NYSTATIN) powder Apply topically 2 (two) times daily. Apply to inguinal rash  . phenazopyridine (PYRIDIUM) 200 MG tablet Take 200 mg by mouth 3 (three) times daily as needed for pain.  Vladimir Faster Glycol-Propyl Glycol (SYSTANE) 0.4-0.3 % SOLN Place 2 drops into the right eye 2 (two) times daily.  . potassium chloride SA (K-DUR) 20 MEQ tablet Take 20 mEq by mouth daily.  . Probiotic Product (ALIGN) 4 MG CAPS Take 1 tablet by mouth daily.    . vitamin C (ASCORBIC ACID) 500 MG tablet Take 500 mg by mouth daily.   No facility-administered encounter medications on  file as of 07/28/2019.    Review of Systems:  Review of Systems  Constitutional: Positive for malaise/fatigue. Negative for chills and fever.  HENT: Positive for hearing loss. Negative for congestion and sore throat.   Eyes: Positive for blurred vision.  Respiratory: Negative for cough and shortness of breath.   Cardiovascular: Negative for chest pain, palpitations and leg swelling.  Gastrointestinal: Positive for constipation. Negative for abdominal pain, blood in stool, diarrhea and melena.  Genitourinary: Positive for frequency and urgency. Negative for dysuria.  Retention, has I/O caths q6h right now  Musculoskeletal: Negative for falls.  Skin:       Chronic dryness of ears  Neurological: Negative for dizziness and loss of consciousness.       Chronic foot drop  Psychiatric/Behavioral: Positive for memory loss. Negative for depression. The patient is nervous/anxious and has insomnia.        Insomnia due to urinary frequency (despite cath 2 hrs after her bedtime)    Health Maintenance  Topic Date Due  . TETANUS/TDAP  10/08/2021  . INFLUENZA VACCINE  Completed  . DEXA SCAN  Completed  . PNA vac Low Risk Adult  Completed    Physical Exam: Vitals:   07/28/19 1425  BP: 138/88  Pulse: 96  Temp: (!) 97.3 F (36.3 C)  TempSrc: Temporal  SpO2: 96%  Weight: 105 lb 8 oz (47.9 kg)  Height: '4\' 10"'  (1.473 m)   Body mass index is 22.05 kg/m. Physical Exam Vitals reviewed.  Constitutional:      General: She is not in acute distress.    Appearance: Normal appearance. She is not toxic-appearing.  HENT:     Head: Normocephalic and atraumatic.  Eyes:     Pupils: Pupils are equal, round, and reactive to light.     Comments: Very poor vision--requires large dark print  Cardiovascular:     Rate and Rhythm: Normal rate and regular rhythm.     Pulses: Normal pulses.     Heart sounds: Murmur present.  Pulmonary:     Effort: Pulmonary effort is normal.     Breath sounds:  Normal breath sounds. No wheezing, rhonchi or rales.  Abdominal:     General: Bowel sounds are normal. There is no distension.     Palpations: Abdomen is soft.  Musculoskeletal:        General: Normal range of motion.     Right lower leg: No edema.     Left lower leg: No edema.  Skin:    General: Skin is warm and dry.     Comments: Dry scaly skin of ears  Neurological:     Mental Status: She is alert. Mental status is at baseline.     Comments: Chronic foot drop, comes in wheelchair now  Psychiatric:        Mood and Affect: Mood normal.    Labs reviewed:  Lab Results  Component Value Date   HGBA1C 5.9 12/02/2006   Assessment/Plan 1. Urinary retention -ongoing issue, getting q6h I/O caths at 6/05/23/11; however, she is having lots of interruption of sleep and still waking early at 5 feeling like she has to go -I proposed 5/05/22/1029-11pm as the cath schedule in hopes she'd have more rest overnight and get her bladder emptied when she feels it's necessary early am--not clear how well this will work with nursing routine -other question is whether she actually needs even more frequent caths -avoiding foley due to already recurrent UTIs and need for her chronic prophylaxis  2. Chronic constipation with overflow incontinence -cont same regimen at this time for bowels--nursing has not reported concerns about her bowel movements lately  3. Bilateral exudative age-related macular degeneration, unspecified stage (HCC) -is advanced, can only read very large dark print which is how we communicated today  4. Sensorineural hearing loss (SNHL) of both ears -has new hearing aids and could not hear me at all today -heard better last visit so I'm not sure if they are not turned up properly -ears did not have  any cerumen in them  5. Generalized weakness -has progressed, not walking as much and using wheelchair for long distances like down to clinic -is moving toward SNF needs (technically has  met for a long time, but she's stayed in [place])  6. Dementia without behavioral disturbance, unspecified dementia type ([place]) -cognition is declining but she was much more alert this visit than the last two and very much able to communicate her wishes  7.  ACP -met with patient today to review her MOST form for 25 mins today -we updated her form based on her current wishes which were determined by writing communication in large print -she would like to remain "DNR"--does not want CPR or to be placed on machines -she no longer wants to be hospitalized if she becomes very ill -she would like to be kept comfortable here at [facility] -she says she is "ready to go" and "nothing works anymore" -she does not want fluids or tube feeding -she only wants antibiotics if they will help her to be more comfortable/not suffer at the end-of-life -MOST was updated electronically by Katy Fitch and sent to pt's son who signed it and it's now available in vynca--she was printing it on pink paper for her paper chart  Labs/tests ordered:  No new Next appt:  10/20/2019  Kaslyn Richburg L. Cheikh Bramble, D.O. [facility] [address], [place] [address] Cell Phone (Mon-Fri 8am-5pm):  [phone] On Call:  [phone] & follow prompts after 5pm & weekends Office Phone:  [phone] Office Fax:  [phone]

## 2019-08-03 DIAGNOSIS — R278 Other lack of coordination: Secondary | ICD-10-CM | POA: Diagnosis not present

## 2019-08-03 DIAGNOSIS — Z9181 History of falling: Secondary | ICD-10-CM | POA: Diagnosis not present

## 2019-08-03 DIAGNOSIS — M6389 Disorders of muscle in diseases classified elsewhere, multiple sites: Secondary | ICD-10-CM | POA: Diagnosis not present

## 2019-08-03 DIAGNOSIS — R2689 Other abnormalities of gait and mobility: Secondary | ICD-10-CM | POA: Diagnosis not present

## 2019-08-03 DIAGNOSIS — N390 Urinary tract infection, site not specified: Secondary | ICD-10-CM | POA: Diagnosis not present

## 2019-08-09 DIAGNOSIS — R2689 Other abnormalities of gait and mobility: Secondary | ICD-10-CM | POA: Diagnosis not present

## 2019-08-09 DIAGNOSIS — R278 Other lack of coordination: Secondary | ICD-10-CM | POA: Diagnosis not present

## 2019-08-09 DIAGNOSIS — M6389 Disorders of muscle in diseases classified elsewhere, multiple sites: Secondary | ICD-10-CM | POA: Diagnosis not present

## 2019-08-09 DIAGNOSIS — Z9181 History of falling: Secondary | ICD-10-CM | POA: Diagnosis not present

## 2019-08-09 DIAGNOSIS — N390 Urinary tract infection, site not specified: Secondary | ICD-10-CM | POA: Diagnosis not present

## 2019-08-13 DIAGNOSIS — M6389 Disorders of muscle in diseases classified elsewhere, multiple sites: Secondary | ICD-10-CM | POA: Diagnosis not present

## 2019-08-13 DIAGNOSIS — Z9181 History of falling: Secondary | ICD-10-CM | POA: Diagnosis not present

## 2019-08-13 DIAGNOSIS — R278 Other lack of coordination: Secondary | ICD-10-CM | POA: Diagnosis not present

## 2019-08-13 DIAGNOSIS — R2689 Other abnormalities of gait and mobility: Secondary | ICD-10-CM | POA: Diagnosis not present

## 2019-08-13 DIAGNOSIS — N390 Urinary tract infection, site not specified: Secondary | ICD-10-CM | POA: Diagnosis not present

## 2019-08-16 DIAGNOSIS — R2689 Other abnormalities of gait and mobility: Secondary | ICD-10-CM | POA: Diagnosis not present

## 2019-08-16 DIAGNOSIS — I890 Lymphedema, not elsewhere classified: Secondary | ICD-10-CM | POA: Diagnosis not present

## 2019-08-16 DIAGNOSIS — M6389 Disorders of muscle in diseases classified elsewhere, multiple sites: Secondary | ICD-10-CM | POA: Diagnosis not present

## 2019-08-16 DIAGNOSIS — R278 Other lack of coordination: Secondary | ICD-10-CM | POA: Diagnosis not present

## 2019-08-16 DIAGNOSIS — N390 Urinary tract infection, site not specified: Secondary | ICD-10-CM | POA: Diagnosis not present

## 2019-08-16 DIAGNOSIS — Z9181 History of falling: Secondary | ICD-10-CM | POA: Diagnosis not present

## 2019-08-16 DIAGNOSIS — R293 Abnormal posture: Secondary | ICD-10-CM | POA: Diagnosis not present

## 2019-08-20 DIAGNOSIS — R278 Other lack of coordination: Secondary | ICD-10-CM | POA: Diagnosis not present

## 2019-08-20 DIAGNOSIS — I890 Lymphedema, not elsewhere classified: Secondary | ICD-10-CM | POA: Diagnosis not present

## 2019-08-20 DIAGNOSIS — R2689 Other abnormalities of gait and mobility: Secondary | ICD-10-CM | POA: Diagnosis not present

## 2019-08-20 DIAGNOSIS — M6389 Disorders of muscle in diseases classified elsewhere, multiple sites: Secondary | ICD-10-CM | POA: Diagnosis not present

## 2019-08-20 DIAGNOSIS — N390 Urinary tract infection, site not specified: Secondary | ICD-10-CM | POA: Diagnosis not present

## 2019-08-20 DIAGNOSIS — R293 Abnormal posture: Secondary | ICD-10-CM | POA: Diagnosis not present

## 2019-08-23 DIAGNOSIS — M6389 Disorders of muscle in diseases classified elsewhere, multiple sites: Secondary | ICD-10-CM | POA: Diagnosis not present

## 2019-08-23 DIAGNOSIS — R293 Abnormal posture: Secondary | ICD-10-CM | POA: Diagnosis not present

## 2019-08-23 DIAGNOSIS — N390 Urinary tract infection, site not specified: Secondary | ICD-10-CM | POA: Diagnosis not present

## 2019-08-23 DIAGNOSIS — I890 Lymphedema, not elsewhere classified: Secondary | ICD-10-CM | POA: Diagnosis not present

## 2019-08-23 DIAGNOSIS — R2689 Other abnormalities of gait and mobility: Secondary | ICD-10-CM | POA: Diagnosis not present

## 2019-08-23 DIAGNOSIS — R278 Other lack of coordination: Secondary | ICD-10-CM | POA: Diagnosis not present

## 2019-08-26 DIAGNOSIS — N390 Urinary tract infection, site not specified: Secondary | ICD-10-CM | POA: Diagnosis not present

## 2019-08-26 DIAGNOSIS — R293 Abnormal posture: Secondary | ICD-10-CM | POA: Diagnosis not present

## 2019-08-26 DIAGNOSIS — I890 Lymphedema, not elsewhere classified: Secondary | ICD-10-CM | POA: Diagnosis not present

## 2019-08-26 DIAGNOSIS — R278 Other lack of coordination: Secondary | ICD-10-CM | POA: Diagnosis not present

## 2019-08-26 DIAGNOSIS — R2689 Other abnormalities of gait and mobility: Secondary | ICD-10-CM | POA: Diagnosis not present

## 2019-08-26 DIAGNOSIS — M6389 Disorders of muscle in diseases classified elsewhere, multiple sites: Secondary | ICD-10-CM | POA: Diagnosis not present

## 2019-08-30 DIAGNOSIS — N390 Urinary tract infection, site not specified: Secondary | ICD-10-CM | POA: Diagnosis not present

## 2019-08-30 DIAGNOSIS — M6389 Disorders of muscle in diseases classified elsewhere, multiple sites: Secondary | ICD-10-CM | POA: Diagnosis not present

## 2019-08-30 DIAGNOSIS — R278 Other lack of coordination: Secondary | ICD-10-CM | POA: Diagnosis not present

## 2019-08-30 DIAGNOSIS — R293 Abnormal posture: Secondary | ICD-10-CM | POA: Diagnosis not present

## 2019-08-30 DIAGNOSIS — R2689 Other abnormalities of gait and mobility: Secondary | ICD-10-CM | POA: Diagnosis not present

## 2019-08-30 DIAGNOSIS — I890 Lymphedema, not elsewhere classified: Secondary | ICD-10-CM | POA: Diagnosis not present

## 2019-09-02 DIAGNOSIS — I890 Lymphedema, not elsewhere classified: Secondary | ICD-10-CM | POA: Diagnosis not present

## 2019-09-02 DIAGNOSIS — R2689 Other abnormalities of gait and mobility: Secondary | ICD-10-CM | POA: Diagnosis not present

## 2019-09-02 DIAGNOSIS — N390 Urinary tract infection, site not specified: Secondary | ICD-10-CM | POA: Diagnosis not present

## 2019-09-02 DIAGNOSIS — R278 Other lack of coordination: Secondary | ICD-10-CM | POA: Diagnosis not present

## 2019-09-02 DIAGNOSIS — R293 Abnormal posture: Secondary | ICD-10-CM | POA: Diagnosis not present

## 2019-09-02 DIAGNOSIS — M6389 Disorders of muscle in diseases classified elsewhere, multiple sites: Secondary | ICD-10-CM | POA: Diagnosis not present

## 2019-09-06 DIAGNOSIS — R293 Abnormal posture: Secondary | ICD-10-CM | POA: Diagnosis not present

## 2019-09-06 DIAGNOSIS — M6389 Disorders of muscle in diseases classified elsewhere, multiple sites: Secondary | ICD-10-CM | POA: Diagnosis not present

## 2019-09-06 DIAGNOSIS — I890 Lymphedema, not elsewhere classified: Secondary | ICD-10-CM | POA: Diagnosis not present

## 2019-09-06 DIAGNOSIS — N390 Urinary tract infection, site not specified: Secondary | ICD-10-CM | POA: Diagnosis not present

## 2019-09-06 DIAGNOSIS — R278 Other lack of coordination: Secondary | ICD-10-CM | POA: Diagnosis not present

## 2019-09-06 DIAGNOSIS — R2689 Other abnormalities of gait and mobility: Secondary | ICD-10-CM | POA: Diagnosis not present

## 2019-09-08 DIAGNOSIS — R293 Abnormal posture: Secondary | ICD-10-CM | POA: Diagnosis not present

## 2019-09-08 DIAGNOSIS — R278 Other lack of coordination: Secondary | ICD-10-CM | POA: Diagnosis not present

## 2019-09-08 DIAGNOSIS — N390 Urinary tract infection, site not specified: Secondary | ICD-10-CM | POA: Diagnosis not present

## 2019-09-08 DIAGNOSIS — M6389 Disorders of muscle in diseases classified elsewhere, multiple sites: Secondary | ICD-10-CM | POA: Diagnosis not present

## 2019-09-08 DIAGNOSIS — R2689 Other abnormalities of gait and mobility: Secondary | ICD-10-CM | POA: Diagnosis not present

## 2019-09-08 DIAGNOSIS — I890 Lymphedema, not elsewhere classified: Secondary | ICD-10-CM | POA: Diagnosis not present

## 2019-09-09 DIAGNOSIS — R278 Other lack of coordination: Secondary | ICD-10-CM | POA: Diagnosis not present

## 2019-09-09 DIAGNOSIS — R2689 Other abnormalities of gait and mobility: Secondary | ICD-10-CM | POA: Diagnosis not present

## 2019-09-09 DIAGNOSIS — I890 Lymphedema, not elsewhere classified: Secondary | ICD-10-CM | POA: Diagnosis not present

## 2019-09-09 DIAGNOSIS — M6389 Disorders of muscle in diseases classified elsewhere, multiple sites: Secondary | ICD-10-CM | POA: Diagnosis not present

## 2019-09-09 DIAGNOSIS — N390 Urinary tract infection, site not specified: Secondary | ICD-10-CM | POA: Diagnosis not present

## 2019-09-09 DIAGNOSIS — R293 Abnormal posture: Secondary | ICD-10-CM | POA: Diagnosis not present

## 2019-09-13 DIAGNOSIS — R278 Other lack of coordination: Secondary | ICD-10-CM | POA: Diagnosis not present

## 2019-09-13 DIAGNOSIS — R293 Abnormal posture: Secondary | ICD-10-CM | POA: Diagnosis not present

## 2019-09-13 DIAGNOSIS — I890 Lymphedema, not elsewhere classified: Secondary | ICD-10-CM | POA: Diagnosis not present

## 2019-09-13 DIAGNOSIS — M6389 Disorders of muscle in diseases classified elsewhere, multiple sites: Secondary | ICD-10-CM | POA: Diagnosis not present

## 2019-09-13 DIAGNOSIS — N390 Urinary tract infection, site not specified: Secondary | ICD-10-CM | POA: Diagnosis not present

## 2019-09-13 DIAGNOSIS — R2689 Other abnormalities of gait and mobility: Secondary | ICD-10-CM | POA: Diagnosis not present

## 2019-10-20 ENCOUNTER — Encounter: Payer: Self-pay | Admitting: Internal Medicine

## 2019-11-04 ENCOUNTER — Non-Acute Institutional Stay (SKILLED_NURSING_FACILITY): Payer: Medicare Other | Admitting: Adult Health

## 2019-11-04 ENCOUNTER — Encounter: Payer: Self-pay | Admitting: Adult Health

## 2019-11-04 DIAGNOSIS — F0390 Unspecified dementia without behavioral disturbance: Secondary | ICD-10-CM | POA: Diagnosis not present

## 2019-11-04 DIAGNOSIS — B3731 Acute candidiasis of vulva and vagina: Secondary | ICD-10-CM

## 2019-11-04 DIAGNOSIS — E039 Hypothyroidism, unspecified: Secondary | ICD-10-CM | POA: Diagnosis not present

## 2019-11-04 DIAGNOSIS — R339 Retention of urine, unspecified: Secondary | ICD-10-CM | POA: Diagnosis not present

## 2019-11-04 DIAGNOSIS — B373 Candidiasis of vulva and vagina: Secondary | ICD-10-CM | POA: Diagnosis not present

## 2019-11-04 DIAGNOSIS — G609 Hereditary and idiopathic neuropathy, unspecified: Secondary | ICD-10-CM

## 2019-11-04 DIAGNOSIS — H903 Sensorineural hearing loss, bilateral: Secondary | ICD-10-CM

## 2019-11-04 NOTE — Progress Notes (Signed)
Location:  Occupational psychologist of Service:  SNF (31) Provider:   Cindi Carbon, ANP [place] [phone]  Gayland Curry, DO  Patient Care Team: Gayland Curry, DO as PCP - General (Geriatric Medicine) Terrance Mass, MD (Inactive) as Consulting Physician (Gynecology) Shon Hough, MD as Consulting Physician (Ophthalmology) Carolan Clines, MD (Inactive) as Consulting Physician (Urology) Gatha Mayer, MD as Consulting Physician (Gastroenterology) Wyatt Portela, MD as Consulting Physician (Oncology)  Extended Emergency Contact Information Primary Emergency Contact: Fox,Richard (Dr) Address: [address]          [place], [place] Phone: [phone] Work Phone: [phone] Relation: Alanson Puls Secondary Emergency Contact: Claudell Kyle Address: [place] [address] Johnnette Litter of [place] Mobile Phone: [phone] Relation: Son  Code Status:  DNR Goals of care: Advanced Directive information Advanced Directives 11/04/2019  Does Patient Have a Medical Advance Directive? Yes  Type of Paramedic of [place];Living will;Out of facility DNR (pink MOST or yellow form)  Does patient want to make changes to medical advance directive? No - Patient declined  Copy of [place] in Chart? Yes - validated most recent copy scanned in chart (See row information)  Pre-existing out of facility DNR order (yellow form or pink MOST form) Pink MOST/Yellow Form most recent copy in chart - Physician notified to receive inpatient order     Chief Complaint  Patient presents with  . Acute Visit    rash  . Medical Management of Chronic Issues    HPI:  Pt is a 86 y.o. female seen today for an acute visit for rash, and medical management of chronic diseases.    [name] Small recently moved to skilled care due to increased care needs with  progressive memory loss. She has low vision and hearing. She wears hearing aides but still needs a white board for the staff to write down communication on to facilitate conversation.   She has urinary retention and has I and O cath performed three times a day. The urine characteristics are WNL and she has no acute urinary complaints.  She has a hx of lymphedema in her arms, worse now on the right. She is seeing OT for management of the edema and wears a compression sleeve. It tends to concentrate in the right shoulder, axillary area with the application of the sleeve. She denies any pain to this area. Given her age of 86 and wishes for comfort care this has not been explored further.   Her last MMSE was 28/30 and she is due for another one today. She is showing signs of progressive memory loss but this is difficult to assess with her other sensory needs.   The staff report erythema to the groin area. One dose of terazol cream has been used which has helped. This is a chronic reoccurring issue.  Past Medical History:  Diagnosis Date  . Anemia, unspecified   . Asymptomatic varicose veins   . Carotid bruit    hx of  . Carotid bruit    doppler normal in the past  . Diverticulosis   . Dyslipidemia   . Esophageal reflux   . Female bladder prolapse, acquired 07/17/2010  . Fibroid   . Full incontinence of feces   . Hemorrhoid    1963 surgical excision  . History of colon cancer    Adenocarcinoma  of right colon, stage T2, N1.s/p resection/chemotherapy 2002  . HTN (hypertension)   . Hx of colonic polyps    1965 surgical excision  . Hypothyroidism   . Idiopathic osteoporosis   . Idiopathic osteoporosis   . Internal hemorrhoids without mention of complication    bleed easily  . Irritable bowel syndrome   . Melanoma of skin, site unspecified   . MVP (mitral valve prolapse)   . OA (osteoarthritis)    left hip  . Osteoarthrosis, unspecified whether generalized or localized, pelvic region  and thigh   . Peripheral neuropathy    hands/ feet  . 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 Spinal stenosis, unspecified region other than cervical   . Vaginal bleeding, abnormal   . Vaginitis, atrophic   . Varicose veins    Past Surgical History:  Procedure Laterality Date  . COLONOSCOPY W/ POLYPECTOMY  2006   3 mm polyp destroyed, diverticulosis  . DILATION AND CURETTAGE OF UTERUS    . ESOPHAGOGASTRODUODENOSCOPY  2009   mild gastritis  . HEMICOLECTOMY  2002   right  . [place]  . HYSTEROSCOPY    . MELANOMA EXCISION      Allergies  Allergen Reactions  . Amoxil [Amoxicillin] Diarrhea    Has patient had a PCN reaction causing immediate rash, facial/tongue/throat swelling, SOB or lightheadedness with hypotension: no Has patient had a PCN reaction causing severe rash involving mucus membranes or skin necrosis: no Has patient had a PCN reaction that required hospitalization : no Has patient had a PCN reaction occurring within the last 10 years: no If all of the above answers are "NO", then may proceed with Cephalosporin use.   Otho Darner Allergy]     sensitivity  . Demerol     unknown  . Meperidine Hcl Other (See Comments)    Drop in blood pressure  . Infed [Iron Dextran] Rash    Became very red over face, scalp, and arms  . Neosporin [Neomycin-Bacitracin Zn-Polymyx] Rash    unknown    Outpatient Encounter Medications as of 11/04/2019  Medication Sig  . acetaminophen (TYLENOL) 325 MG tablet Take 650 mg by mouth as needed.   Marland Kitchen acetaminophen (TYLENOL) 650 MG CR tablet Take 1,300 mg by mouth 2 (two) times daily.  Marland Kitchen antiseptic oral rinse (BIOTENE) LIQD 15 mLs by Mouth Rinse route 3 (three) times daily.  . cholecalciferol (VITAMIN D) 1000 UNITS tablet Take 1 tablet (1,000 Units total) by mouth daily.  . Cranberry (ELLURA PO) Take 36 mg by mouth daily.  . Cranberry 400 MG CAPS Take 1 capsule by mouth 2 (two) times daily.  Marland Kitchen gabapentin (NEURONTIN) 100 MG  capsule Take 100 mg by mouth at bedtime.  Marland Kitchen levothyroxine (SYNTHROID, LEVOTHROID) 75 MCG tablet TAKE 1 TABLET DAILY BEFORE BREAKFAST FOR THYROID.  . Melatonin 5 MG TABS Take by mouth at bedtime as needed.   . methenamine (HIPREX) 1 g tablet Take 1 g by mouth at bedtime.  . miconazole (ZEASORB-AF) 2 % powder Apply topically daily. Groin area  . nystatin (NYSTATIN) powder Apply topically 2 (two) times daily. Apply to inguinal rash  . phenazopyridine (PYRIDIUM) 200 MG tablet Take 200 mg by mouth 3 (three) times daily as needed for pain.  Vladimir Faster Glycol-Propyl Glycol (SYSTANE) 0.4-0.3 % SOLN Place 2 drops into the right eye 2 (two) times daily.  . potassium chloride SA (K-DUR) 20 MEQ tablet Take 20 mEq by mouth daily.  . Probiotic Product (ALIGN) 4  MG CAPS Take 1 tablet by mouth daily.    Marland Kitchen terconazole (TERAZOL 3) 0.8 % vaginal cream Place 1 applicator vaginally at bedtime.  . vitamin C (ASCORBIC ACID) 500 MG tablet Take 500 mg by mouth daily.  . Eyelid Cleansers (OCUSOFT EYELID CLEANSING) PADS Apply topically as needed.  . hydrocortisone (ANUSOL-HC) 2.5 % rectal cream Place 1 application rectally daily as needed for hemorrhoids or itching.   . hydrocortisone (ANUSOL-HC) 25 MG suppository Place 25 mg rectally 2 (two) times daily as needed.   Marland Kitchen ketoconazole (NIZORAL) 2 % cream Apply 1 application topically 2 (two) times daily as needed for irritation. Apply b/l  to ears and surrounding skin ongoing for dermatitis  . [DISCONTINUED] cephALEXin (KEFLEX) 500 MG capsule Take 1 capsule (500 mg total) by mouth 2 (two) times daily.  . [DISCONTINUED] methylcellulose (CITRUCEL FIBERSHAKE) oral powder Take 1 packet by mouth daily as needed.    No facility-administered encounter medications on file as of 11/04/2019.    Review of Systems  Constitutional: Negative for activity change, appetite change, chills, diaphoresis, fatigue, fever and unexpected weight change.  HENT: Positive for hearing loss. Negative  for congestion.   Eyes:       Low vision  Respiratory: Negative for cough, shortness of breath and wheezing.   Cardiovascular: Negative for chest pain, palpitations and leg swelling.       Right arm swelling   Gastrointestinal: Negative for abdominal distention, abdominal pain, constipation and diarrhea.  Genitourinary: Negative for difficulty urinating and dysuria.  Musculoskeletal: Positive for gait problem (uses walker ). Negative for arthralgias, back pain, joint swelling and myalgias.  Neurological: Positive for numbness. Negative for dizziness, tremors, seizures, syncope, facial asymmetry, speech difficulty, weakness, light-headedness and headaches.  Psychiatric/Behavioral: Positive for confusion. Negative for agitation and behavioral problems.    Immunization History  Administered Date(s) Administered  . H1N1 05/30/2008  . Influenza Whole 03/17/2000, 03/05/2010, 03/17/2012  . Influenza,inj,Quad PF,6+ Mos 03/14/2015, 04/07/2018, 04/15/2019  . Influenza-Unspecified 04/16/2013, 03/31/2014, 04/11/2016, 04/10/2017  . Moderna SARS-COVID-2 Vaccination 06/28/2019, 07/27/2019  . Pneumococcal Conjugate-13 09/08/2015  . Pneumococcal Polysaccharide-23 03/17/1997  . Tdap 10/09/2011  . Zoster Recombinat (Shingrix) 07/13/2017   Pertinent  Health Maintenance Due  Topic Date Due  . INFLUENZA VACCINE  01/16/2020  . DEXA SCAN  Completed  . PNA vac Low Risk Adult  Completed   Fall Risk  07/28/2019 02/12/2019 04/08/2018 01/06/2018 11/26/2017  Falls in the past year? - 0 No Yes No  Number falls in past yr: 0 0 - 1 -  Injury with Fall? 0 0 - No -  Risk for fall due to : - Impaired balance/gait - - -  Follow up - Falls evaluation completed - - -   Functional Status Survey:    Vitals:   11/04/19 1454  Weight: 107 lb 3.2 oz (48.6 kg)   Body mass index is 22.4 kg/m. Physical Exam Vitals and nursing note reviewed. Exam conducted with a chaperone present.  Constitutional:      General: She is  not in acute distress.    Appearance: She is not diaphoretic.  HENT:     Head: Normocephalic and atraumatic.  Neck:     Vascular: No JVD.  Cardiovascular:     Rate and Rhythm: Normal rate and regular rhythm.     Heart sounds: No murmur.  Pulmonary:     Effort: Pulmonary effort is normal. No respiratory distress.     Breath sounds: Normal breath sounds. No wheezing.  Abdominal:  General: Bowel sounds are normal. There is no distension.     Palpations: Abdomen is soft.     Tenderness: There is no abdominal tenderness.  Genitourinary:    Exam position: Lithotomy position.     Labia:        Right: No tenderness or lesion.        Left: No tenderness or lesion.      Comments: Atrophic vagina. Clear urine in catheter  Musculoskeletal:        General: Swelling (RUA and shoulder with mild erythema. Non pitting. ) present.     Right lower leg: No edema.     Left lower leg: No edema.  Skin:    General: Skin is warm and dry.     Findings: Rash (noted to groin folds and vaginal area) present. Rash is macular.  Neurological:     Mental Status: She is alert and oriented to person, place, and time.  Psychiatric:        Mood and Affect: Mood normal.     Labs reviewed: No results for input(s): NA, K, CL, CO2, GLUCOSE, BUN, CREATININE, CALCIUM, MG, PHOS in the last 8760 hours. No results for input(s): AST, ALT, ALKPHOS, BILITOT, PROT, ALBUMIN in the last 8760 hours. No results for input(s): WBC, NEUTROABS, HGB, HCT, MCV, PLT in the last 8760 hours. Lab Results  Component Value Date   TSH 2.38 01/30/2017   Lab Results  Component Value Date   HGBA1C 5.9 12/02/2006   Lab Results  Component Value Date   CHOL 174 11/07/2015   HDL 79 (A) 11/07/2015   LDLCALC 78 11/07/2015   LDLDIRECT 123.7 12/08/2007   TRIG 87 11/07/2015   CHOLHDL 3 07/16/2011    Significant Diagnostic Results in last 30 days:  No results found.  Assessment/Plan 1. Vaginal candidiasis Improved with Terazol  0.8%, continue for two more days.   2. Urinary retention Continue with catheterization each shift. Takes hyprex and cranberry for UTI prevention.   3. Acquired hypothyroidism No recent TSH. Resident does not want frequent labs but was ok with a thyroid check since she takes supplementation   4. Dementia without behavioral disturbance, unspecified dementia type ([place]) Slowly progressing. Goals of care are comfort based. Nurse to give MMSE which will be difficult due to low hearing and vision.   5. Hereditary and idiopathic peripheral neuropathy No reports of pain, continue neurontin   6. Sensorineural hearing loss (SNHL) of both ears Wears hearing aides but has severe impairment making communication difficult     Family/ staff Communication: discussed with the resident and her nurse  Labs/tests ordered:  [facility] Monday 5/24

## 2019-11-08 LAB — TSH: TSH: 0.48 (ref 0.41–5.90)

## 2019-11-19 ENCOUNTER — Encounter: Payer: Self-pay | Admitting: Internal Medicine

## 2019-11-24 ENCOUNTER — Encounter: Payer: Medicare Other | Admitting: Internal Medicine

## 2019-11-24 ENCOUNTER — Other Ambulatory Visit: Payer: Self-pay

## 2019-11-25 ENCOUNTER — Encounter: Payer: Self-pay | Admitting: Adult Health

## 2019-11-25 ENCOUNTER — Non-Acute Institutional Stay (SKILLED_NURSING_FACILITY): Payer: Medicare Other | Admitting: Adult Health

## 2019-11-25 DIAGNOSIS — L03113 Cellulitis of right upper limb: Secondary | ICD-10-CM | POA: Diagnosis not present

## 2019-11-25 DIAGNOSIS — I890 Lymphedema, not elsewhere classified: Secondary | ICD-10-CM

## 2019-11-25 NOTE — Progress Notes (Signed)
Location:  Occupational psychologist of Service:  SNF (31) Provider:   Cindi Carbon, ANP [place] [phone]   Gayland Curry, DO  Patient Care Team: Gayland Curry, DO as PCP - General (Geriatric Medicine) Terrance Mass, MD (Inactive) as Consulting Physician (Gynecology) Shon Hough, MD as Consulting Physician (Ophthalmology) Carolan Clines, MD (Inactive) as Consulting Physician (Urology) Gatha Mayer, MD as Consulting Physician (Gastroenterology) Wyatt Portela, MD as Consulting Physician (Oncology)  Extended Emergency Contact Information Primary Emergency Contact: Fox,Richard (Dr) Address: [address]          [place], [place] Phone: [phone] Work Phone: [phone] Relation: Alanson Puls Secondary Emergency Contact: Claudell Kyle Address: [place] [address] Johnnette Litter of [place] Mobile Phone: [phone] Relation: Son  Code Status:  DNR Goals of care: Advanced Directive information Advanced Directives 11/04/2019  Does Patient Have a Medical Advance Directive? Yes  Type of Paramedic of [place];Living will;Out of facility DNR (pink MOST or yellow form)  Does patient want to make changes to medical advance directive? No - Patient declined  Copy of [place] in Chart? Yes - validated most recent copy scanned in chart (See row information)  Pre-existing out of facility DNR order (yellow form or pink MOST form) Pink MOST/Yellow Form most recent copy in chart - Physician notified to receive inpatient order     Chief Complaint  Patient presents with  . Acute Visit    arm pain    HPI:  Pt is a 86 y.o. female seen today for an acute visit for arm pain. She has a hx of lymphedema in the right arm of unclear etiology. She typically wears a compression sleeve but is refusing to do so due to pain and worsening redness  and swelling. She does not have a fever or other toxic s/s.     Past Medical History:  Diagnosis Date  . Anemia, unspecified   . Asymptomatic varicose veins   . Carotid bruit    hx of  . Carotid bruit    doppler normal in the past  . Diverticulosis   . Dyslipidemia   . Esophageal reflux   . Female bladder prolapse, acquired 07/17/2010  . Fibroid   . Full incontinence of feces   . Hemorrhoid    1963 surgical excision  . History of colon cancer    Adenocarcinoma of right colon, stage T2, N1.s/p resection/chemotherapy 2002  . HTN (hypertension)   . Hx of colonic polyps    1965 surgical excision  . Hypothyroidism   . Idiopathic osteoporosis   . Idiopathic osteoporosis   . Internal hemorrhoids without mention of complication    bleed easily  . Irritable bowel syndrome   . Melanoma of skin, site unspecified   . MVP (mitral valve prolapse)   . OA (osteoarthritis)    left hip  . Osteoarthrosis, unspecified whether generalized or localized, pelvic region and thigh   . Peripheral neuropathy    hands/ feet  . 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 Spinal stenosis, unspecified region other than cervical   . Vaginal bleeding, abnormal   . Vaginitis, atrophic   . Varicose veins    Past Surgical History:  Procedure Laterality Date  . COLONOSCOPY W/ POLYPECTOMY  2006   3 mm polyp destroyed, diverticulosis  . DILATION AND CURETTAGE OF UTERUS    .  ESOPHAGOGASTRODUODENOSCOPY  2009   mild gastritis  . HEMICOLECTOMY  2002   right  . [place]  . HYSTEROSCOPY    . MELANOMA EXCISION      Allergies  Allergen Reactions  . Amoxil [Amoxicillin] Diarrhea    Has patient had a PCN reaction causing immediate rash, facial/tongue/throat swelling, SOB or lightheadedness with hypotension: no Has patient had a PCN reaction causing severe rash involving mucus membranes or skin necrosis: no Has patient had a PCN reaction that required hospitalization : no Has patient had a PCN  reaction occurring within the last 10 years: no If all of the above answers are "NO", then may proceed with Cephalosporin use.   Otho Darner Allergy]     sensitivity  . Demerol     unknown  . Meperidine Hcl Other (See Comments)    Drop in blood pressure  . Infed [Iron Dextran] Rash    Became very red over face, scalp, and arms  . Neosporin [Neomycin-Bacitracin Zn-Polymyx] Rash    unknown    Outpatient Encounter Medications as of 11/25/2019  Medication Sig  . acetaminophen (TYLENOL) 325 MG tablet Take 650 mg by mouth as needed.   Marland Kitchen acetaminophen (TYLENOL) 650 MG CR tablet Take 1,300 mg by mouth 2 (two) times daily.  Marland Kitchen antiseptic oral rinse (BIOTENE) LIQD 15 mLs by Mouth Rinse route 3 (three) times daily.  . cholecalciferol (VITAMIN D) 1000 UNITS tablet Take 1 tablet (1,000 Units total) by mouth daily.  . Cranberry (ELLURA PO) Take 36 mg by mouth daily.  . Cranberry 400 MG CAPS Take 1 capsule by mouth 2 (two) times daily.  . Eyelid Cleansers (OCUSOFT EYELID CLEANSING) PADS Apply topically as needed.  . gabapentin (NEURONTIN) 100 MG capsule Take 100 mg by mouth at bedtime.  . hydrocortisone (ANUSOL-HC) 2.5 % rectal cream Place 1 application rectally daily as needed for hemorrhoids or itching.   . hydrocortisone (ANUSOL-HC) 25 MG suppository Place 25 mg rectally 2 (two) times daily as needed.   Marland Kitchen ketoconazole (NIZORAL) 2 % cream Apply 1 application topically 2 (two) times daily as needed for irritation. Apply b/l  to ears and surrounding skin ongoing for dermatitis  . levothyroxine (SYNTHROID, LEVOTHROID) 75 MCG tablet TAKE 1 TABLET DAILY BEFORE BREAKFAST FOR THYROID.  . Melatonin 5 MG TABS Take by mouth at bedtime as needed.   . methenamine (HIPREX) 1 g tablet Take 1 g by mouth at bedtime.  . miconazole (ZEASORB-AF) 2 % powder Apply topically daily. Groin area  . nystatin (NYSTATIN) powder Apply topically 2 (two) times daily. Apply to inguinal rash  . phenazopyridine (PYRIDIUM) 200  MG tablet Take 200 mg by mouth 3 (three) times daily as needed for pain.  Vladimir Faster Glycol-Propyl Glycol (SYSTANE) 0.4-0.3 % SOLN Place 2 drops into the right eye 2 (two) times daily.  . potassium chloride SA (K-DUR) 20 MEQ tablet Take 20 mEq by mouth daily.  . Probiotic Product (ALIGN) 4 MG CAPS Take 1 tablet by mouth daily.    Marland Kitchen terconazole (TERAZOL 3) 0.8 % vaginal cream Place 1 applicator vaginally at bedtime.  . vitamin C (ASCORBIC ACID) 500 MG tablet Take 500 mg by mouth daily.   No facility-administered encounter medications on file as of 11/25/2019.    Review of Systems  Constitutional: Negative for activity change, appetite change, chills, diaphoresis, fatigue, fever and unexpected weight change.  Respiratory: Negative for cough and shortness of breath.   Cardiovascular: Negative for chest pain and  leg swelling.  Gastrointestinal: Negative for abdominal pain, constipation and diarrhea.  Skin:       Arm swelling. Erythema spreading    Immunization History  Administered Date(s) Administered  . H1N1 05/30/2008  . Influenza Whole 03/17/2000, 03/05/2010, 03/17/2012  . Influenza,inj,Quad PF,6+ Mos 03/14/2015, 04/07/2018, 04/15/2019  . Influenza-Unspecified 04/16/2013, 03/31/2014, 04/11/2016, 04/10/2017  . Moderna SARS-COVID-2 Vaccination 06/28/2019, 07/27/2019  . Pneumococcal Conjugate-13 09/08/2015  . Pneumococcal Polysaccharide-23 03/17/1997  . Tdap 10/09/2011  . Zoster Recombinat (Shingrix) 07/13/2017   Pertinent  Health Maintenance Due  Topic Date Due  . INFLUENZA VACCINE  01/16/2020  . DEXA SCAN  Completed  . PNA vac Low Risk Adult  Completed   Fall Risk  07/28/2019 02/12/2019 04/08/2018 01/06/2018 11/26/2017  Falls in the past year? - 0 No Yes No  Number falls in past yr: 0 0 - 1 -  Injury with Fall? 0 0 - No -  Risk for fall due to : - Impaired balance/gait - - -  Follow up - Falls evaluation completed - - -   Functional Status Survey:    Vitals:   11/25/19 1502   Temp: 98 F (36.7 C)   There is no height or weight on file to calculate BMI. Physical Exam Vitals and nursing note reviewed.  Constitutional:      Comments: Frail thin female  Cardiovascular:     Pulses:          Radial pulses are 2+ on the right side and 2+ on the left side.  Musculoskeletal:     Right upper arm: Swelling present. No tenderness or bony tenderness.     Left upper arm: No swelling, tenderness or bony tenderness.     Right lower leg: No edema.     Left lower leg: No edema.     Comments: Crepitus noted to both shoulders  Skin:    Findings: Erythema (noted to the forearm spreading to the shoulder and right breast. Area. Not tender.  Warmth is noted ) present.  Neurological:     Mental Status: She is alert.     Labs reviewed: No results for input(s): NA, K, CL, CO2, GLUCOSE, BUN, CREATININE, CALCIUM, MG, PHOS in the last 8760 hours. No results for input(s): AST, ALT, ALKPHOS, BILITOT, PROT, ALBUMIN in the last 8760 hours. No results for input(s): WBC, NEUTROABS, HGB, HCT, MCV, PLT in the last 8760 hours. Lab Results  Component Value Date   TSH 0.48 11/08/2019   Lab Results  Component Value Date   HGBA1C 5.9 12/02/2006   Lab Results  Component Value Date   CHOL 174 11/07/2015   HDL 79 (A) 11/07/2015   LDLCALC 78 11/07/2015   LDLDIRECT 123.7 12/08/2007   TRIG 87 11/07/2015   CHOLHDL 3 07/16/2011    Significant Diagnostic Results in last 30 days:  No results found.  Assessment/Plan 1. Cellulitis of right upper extremity Worsening pain and redness, difficult to ascertain between cellulitis and lymphedema in this setting but given the fact that there is worsening redness and pain at the shoulder and breast area will treat, as pt goals of care are comfort based.  Doxycycline 100 mg bid x 7 days with food and Diflucan 150 mg qd x 1  2. Lymphedema Apply compression hose when pain improves.Continue to work with OT for treatment three times weekly.      Family/ staff Communication: staff/pt  Labs/tests ordered:  NA

## 2019-11-29 ENCOUNTER — Encounter: Payer: Self-pay | Admitting: Adult Health

## 2019-11-29 NOTE — Progress Notes (Signed)
This encounter was created in error - please disregard.

## 2019-12-21 ENCOUNTER — Non-Acute Institutional Stay (SKILLED_NURSING_FACILITY): Payer: Medicare Other | Admitting: Internal Medicine

## 2019-12-21 DIAGNOSIS — N958 Other specified menopausal and perimenopausal disorders: Secondary | ICD-10-CM

## 2019-12-21 DIAGNOSIS — R339 Retention of urine, unspecified: Secondary | ICD-10-CM

## 2019-12-21 DIAGNOSIS — H903 Sensorineural hearing loss, bilateral: Secondary | ICD-10-CM

## 2019-12-21 DIAGNOSIS — N390 Urinary tract infection, site not specified: Secondary | ICD-10-CM

## 2019-12-21 NOTE — Progress Notes (Signed)
Location:  [facility] Room Number: 150A Place of Service:  SNF (31) Provider:  Verdun Rackley L. Mariea Clonts, D.O., C.M.D.  Gayland Curry, DO  Patient Care Team: Gayland Curry, DO as PCP - General (Geriatric Medicine) Terrance Mass, MD (Inactive) as Consulting Physician (Gynecology) Shon Hough, MD as Consulting Physician (Ophthalmology) Carolan Clines, MD (Inactive) as Consulting Physician (Urology) Gatha Mayer, MD as Consulting Physician (Gastroenterology) Wyatt Portela, MD as Consulting Physician (Oncology)  Extended Emergency Contact Information Primary Emergency Contact: Fox,Richard (Dr) Address: [address]          [place], [place] of [place] Phone: [phone] Work Phone: [phone] Relation: Alanson Puls Secondary Emergency Contact: Claudell Kyle Address: [place] [address] Johnnette Litter of [place] Mobile Phone: [phone] Relation: Son  Code Status:  DNR, MOST Goals of care: Advanced Directive information Advanced Directives 12/23/2019  Does Patient Have a Medical Advance Directive? Yes  Type of Paramedic of [place];Out of facility DNR (pink MOST or yellow form)  Does patient want to make changes to medical advance directive? No - Patient declined  Copy of [place] in Chart? Yes - validated most recent copy scanned in chart (See row information)  Pre-existing out of facility DNR order (yellow form or pink MOST form) Pink MOST/Yellow Form most recent copy in chart - Physician notified to receive inpatient order     Chief Complaint  Patient presents with  . Acute Visit    Bladder not emptying  overnight     HPI:  Pt is a 86 y.o. female seen today for an acute visit for concern that she is not emptying her bladder overnight.  She still has I/O cath q6h.  Nursing reports the caths are not an issue and pt is not actually  concerned about this either.  She is wanting to be treating with a strong antibiotic to wipe out a UTI rather than keep taking her prophylactic abx she's been on.  At least that is how I interpreted what she shared today.  We communicated with her dry erase board b/c her hearing has declined so significantly.  She tells me she has not been having any increase in frequency, urgency, dysuria or suprapubic discomfort.  Nursing does not note any change in her level of confusion (does have some short-term memory loss).  She's been afebrile.    Past Medical History:  Diagnosis Date  . Anemia, unspecified   . Asymptomatic varicose veins   . Carotid bruit    hx of  . Carotid bruit    doppler normal in the past  . Diverticulosis   . Dyslipidemia   . Esophageal reflux   . Female bladder prolapse, acquired 07/17/2010  . Fibroid   . Full incontinence of feces   . Hemorrhoid    1963 surgical excision  . History of colon cancer    Adenocarcinoma of right colon, stage T2, N1.s/p resection/chemotherapy 2002  . HTN (hypertension)   . Hx of colonic polyps    1965 surgical excision  . Hypothyroidism   . Idiopathic osteoporosis   . Idiopathic osteoporosis   . Internal hemorrhoids without mention of complication    bleed easily  . Irritable bowel syndrome   . Melanoma of skin, site unspecified   . MVP (mitral valve prolapse)   . OA (osteoarthritis)    left hip  . Osteoarthrosis,  unspecified whether generalized or localized, pelvic region and thigh   . Peripheral neuropathy    hands/ feet  . 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 Spinal stenosis, unspecified region other than cervical   . Vaginal bleeding, abnormal   . Vaginitis, atrophic   . Varicose veins    Past Surgical History:  Procedure Laterality Date  . COLONOSCOPY W/ POLYPECTOMY  2006   3 mm polyp destroyed, diverticulosis  . DILATION AND CURETTAGE OF UTERUS    . ESOPHAGOGASTRODUODENOSCOPY  2009   mild gastritis  . HEMICOLECTOMY   2002   right  . [place]  . HYSTEROSCOPY    . MELANOMA EXCISION      Allergies  Allergen Reactions  . Amoxil [Amoxicillin] Diarrhea    Has patient had a PCN reaction causing immediate rash, facial/tongue/throat swelling, SOB or lightheadedness with hypotension: no Has patient had a PCN reaction causing severe rash involving mucus membranes or skin necrosis: no Has patient had a PCN reaction that required hospitalization : no Has patient had a PCN reaction occurring within the last 10 years: no If all of the above answers are "NO", then may proceed with Cephalosporin use.   Otho Darner Allergy]     sensitivity  . Demerol     unknown  . Meperidine Hcl Other (See Comments)    Drop in blood pressure  . Infed [Iron Dextran] Rash    Became very red over face, scalp, and arms  . Neosporin [Neomycin-Bacitracin Zn-Polymyx] Rash    unknown    Outpatient Encounter Medications as of 12/21/2019  Medication Sig  . acetaminophen (TYLENOL) 650 MG CR tablet Take 1,300 mg by mouth 2 (two) times daily.  Marland Kitchen antiseptic oral rinse (BIOTENE) LIQD 15 mLs by Mouth Rinse route 3 (three) times daily.  . cholecalciferol (VITAMIN D) 1000 UNITS tablet Take 1 tablet (1,000 Units total) by mouth daily.  . Cranberry (ELLURA PO) Take 36 mg by mouth daily.  . Cranberry 400 MG CAPS Take 1 capsule by mouth 2 (two) times daily.  . Eyelid Cleansers (OCUSOFT EYELID CLEANSING) PADS Apply topically as needed.  . gabapentin (NEURONTIN) 100 MG capsule Take 100 mg by mouth at bedtime.  . hydrocortisone (ANUSOL-HC) 2.5 % rectal cream Place 1 application rectally daily as needed for hemorrhoids or itching.   Marland Kitchen ketoconazole (NIZORAL) 2 % cream Apply 1 application topically 2 (two) times daily as needed for irritation. Apply b/l  to ears and surrounding skin ongoing for dermatitis  . levothyroxine (SYNTHROID, LEVOTHROID) 75 MCG tablet TAKE 1 TABLET DAILY BEFORE BREAKFAST FOR THYROID.  . Melatonin 5 MG TABS  Take by mouth at bedtime as needed.   . methenamine (HIPREX) 1 g tablet Take 1 g by mouth at bedtime.  . miconazole (ZEASORB-AF) 2 % powder Apply topically daily. Groin area  . nystatin (NYSTATIN) powder Apply topically 2 (two) times daily. Apply to inguinal rash  . phenazopyridine (PYRIDIUM) 200 MG tablet Take 200 mg by mouth 3 (three) times daily as needed for pain.  Vladimir Faster Glycol-Propyl Glycol (SYSTANE) 0.4-0.3 % SOLN Place 2 drops into the right eye 2 (two) times daily.  . potassium chloride SA (K-DUR) 20 MEQ tablet Take 20 mEq by mouth daily.  . Probiotic Product (ALIGN) 4 MG CAPS Take 1 tablet by mouth daily.    . vitamin C (ASCORBIC ACID) 500 MG tablet Take 500 mg by mouth daily.   No facility-administered encounter medications on file as of 12/21/2019.  Review of Systems  Constitutional: Negative for chills and fever.  HENT: Positive for hearing loss.   Eyes: Positive for blurred vision.  Respiratory: Negative for cough and shortness of breath.   Cardiovascular: Negative for chest pain and palpitations.  Gastrointestinal: Positive for constipation. Negative for abdominal pain.       Bowels have been moving per nursing  Genitourinary: Negative for dysuria.       Chronic urgency, frequency, incontinence, retention doing I/O caths  Musculoskeletal:       Chronic foot drop  Psychiatric/Behavioral: Positive for memory loss. The patient is nervous/anxious.     Immunization History  Administered Date(s) Administered  . H1N1 05/30/2008  . Influenza Whole 03/17/2000, 03/05/2010, 03/17/2012  . Influenza,inj,Quad PF,6+ Mos 03/14/2015, 04/07/2018, 04/15/2019  . Influenza-Unspecified 04/16/2013, 03/31/2014, 04/11/2016, 04/10/2017  . Moderna SARS-COVID-2 Vaccination 06/28/2019, 07/27/2019  . Pneumococcal Conjugate-13 09/08/2015  . Pneumococcal Polysaccharide-23 03/17/1997  . Tdap 10/09/2011  . Zoster Recombinat (Shingrix) 07/13/2017   Pertinent  Health Maintenance Due  Topic  Date Due  . INFLUENZA VACCINE  01/16/2020  . DEXA SCAN  Completed  . PNA vac Low Risk Adult  Completed   Fall Risk  07/28/2019 02/12/2019 04/08/2018 01/06/2018 11/26/2017  Falls in the past year? - 0 No Yes No  Number falls in past yr: 0 0 - 1 -  Injury with Fall? 0 0 - No -  Risk for fall due to : - Impaired balance/gait - - -  Follow up - Falls evaluation completed - - -   Functional Status Survey:    Vitals:   12/23/19 1625  BP: 136/72  Pulse: 62  Temp: (!) 97.5 F (36.4 C)  TempSrc: Temporal  SpO2: 93%  Weight: 107 lb 3.2 oz (48.6 kg)  Height: 4\' 10"  (1.473 m)   Body mass index is 22.4 kg/m. Physical Exam Vitals reviewed.  Constitutional:      General: She is not in acute distress.    Appearance: She is not toxic-appearing.     Comments: Petite, frail female in NAD, ambulates slowly and drags drop foot with walker  HENT:     Head: Normocephalic and atraumatic.  Cardiovascular:     Rate and Rhythm: Normal rate and regular rhythm.     Pulses: Normal pulses.     Heart sounds: Normal heart sounds.  Pulmonary:     Effort: Pulmonary effort is normal.     Breath sounds: Normal breath sounds.  Abdominal:     General: Bowel sounds are normal. There is no distension.     Palpations: Abdomen is soft. There is no mass.     Tenderness: There is no abdominal tenderness.  Musculoskeletal:        General: Normal range of motion.     Right lower leg: No edema.     Left lower leg: No edema.  Neurological:     Mental Status: She is alert.     Comments: chronic foot drop, some dysarthria longstanding  Psychiatric:        Mood and Affect: Mood normal.     Labs reviewed: No results for input(s): NA, K, CL, CO2, GLUCOSE, BUN, CREATININE, CALCIUM, MG, PHOS in the last 8760 hours. No results for input(s): AST, ALT, ALKPHOS, BILITOT, PROT, ALBUMIN in the last 8760 hours. No results for input(s): WBC, NEUTROABS, HGB, HCT, MCV, PLT in the last 8760 hours. Lab Results  Component  Value Date   TSH 0.48 11/08/2019   Lab Results  Component Value Date  HGBA1C 5.9 12/02/2006   Lab Results  Component Value Date   CHOL 174 11/07/2015   HDL 79 (A) 11/07/2015   LDLCALC 78 11/07/2015   LDLDIRECT 123.7 12/08/2007   TRIG 87 11/07/2015   CHOLHDL 3 07/16/2011    Significant Diagnostic Results in last 30 days:  No results found.  Assessment/Plan 1. Urinary retention -ongoing but not different, cont I/O caths q6h  2. Recurrent UTI -cont methenamine prophylaxis qhs -due her nausea and longstanding abx prophylaxis, I opted to treat her with a two week course of probiotic   3. Genitourinary syndrome of menopause -suspect is big component for her along with complications of her constipation and just persistent incontinence causing irritation  4. Sensorineural hearing loss (SNHL) of both ears -now requires dry erase board for communication  Family/ staff Communication: discussed with SNF nurse  Labs/tests ordered:  No new UA c+s indicated  Tarin Navarez L. Dayvon Dax, D.O. [facility] [address], [place] [address] Cell Phone (Mon-Fri 8am-5pm):  [phone] On Call:  [phone] & follow prompts after 5pm & weekends Office Phone:  [phone] Office Fax:  [phone]

## 2019-12-23 ENCOUNTER — Encounter: Payer: Self-pay | Admitting: Internal Medicine

## 2020-01-03 ENCOUNTER — Non-Acute Institutional Stay (SKILLED_NURSING_FACILITY): Payer: Medicare Other | Admitting: Adult Health

## 2020-01-03 ENCOUNTER — Encounter: Payer: Self-pay | Admitting: Adult Health

## 2020-01-03 DIAGNOSIS — N952 Postmenopausal atrophic vaginitis: Secondary | ICD-10-CM | POA: Diagnosis not present

## 2020-01-03 DIAGNOSIS — B373 Candidiasis of vulva and vagina: Secondary | ICD-10-CM

## 2020-01-03 DIAGNOSIS — N958 Other specified menopausal and perimenopausal disorders: Secondary | ICD-10-CM | POA: Diagnosis not present

## 2020-01-03 DIAGNOSIS — R339 Retention of urine, unspecified: Secondary | ICD-10-CM | POA: Diagnosis not present

## 2020-01-03 DIAGNOSIS — B3731 Acute candidiasis of vulva and vagina: Secondary | ICD-10-CM

## 2020-01-03 NOTE — Progress Notes (Signed)
Location:  [facility] of Service:  SNF (31) Provider:   Cindi Carbon, ANP [place] [phone]   Gayland Curry, DO  Patient Care Team: Gayland Curry, DO as PCP - General (Geriatric Medicine) Terrance Mass, MD (Inactive) as Consulting Physician (Gynecology) Shon Hough, MD as Consulting Physician (Ophthalmology) Carolan Clines, MD (Inactive) as Consulting Physician (Urology) Gatha Mayer, MD as Consulting Physician (Gastroenterology) Wyatt Portela, MD as Consulting Physician (Oncology)  Extended Emergency Contact Information Primary Emergency Contact: Fox,Richard (Dr) Address: [address]          [place], [place] of [place] Phone: [phone] Work Phone: [phone] Relation: Alanson Puls Secondary Emergency Contact: Claudell Kyle Address: [place] [address] Johnnette Litter of [place] Mobile Phone: [phone] Relation: Son  Code Status:  DNR Goals of care: Advanced Directive information Advanced Directives 12/23/2019  Does Patient Have a Medical Advance Directive? Yes  Type of Paramedic of [place];Out of facility DNR (pink MOST or yellow form)  Does patient want to make changes to medical advance directive? No - Patient declined  Copy of [place] in Chart? Yes - validated most recent copy scanned in chart (See row information)  Pre-existing out of facility DNR order (yellow form or pink MOST form) Pink MOST/Yellow Form most recent copy in chart - Physician notified to receive inpatient order     Chief Complaint  Patient presents with  . Acute Visit    dysuria and frequency    HPI:  Pt is a 86 y.o. female seen today for an acute visit for dysuria and frequency. Ms. Browning has a hx of atrophic vaginitis, urinary retention, prolapsed bladder, and incontinence. Staff at [facility] cath her q 6 hrs. ON 7/6 she  was seen for bladder issues felt to be related to genitourinary syndrome/menopause and chronic retention. She was prescribed Florastor for UTI prevention. Since then she developed pain, dysuria, feelings of fullness, and frequency.  She reports that she has not slept well in two weeks and is quite adamant that something is wrong with her. She has not had a fever or purulent urine. She is eating and drinking well and is alert. She states "I have been catheterized for 5 years and something is wrong".  In review of matrix notes the symptoms mentioned above are mostly present at night. She calls out at night up to 8 times due to discomfort which is unusual for her. The nurse has tried pyridium which has not helped. Her symptoms seem to improve after catheterization.  Ms. Ivins request to have her 4pm pill (tylenol) and any large pills discontinued. Stating "I only want a pill if it will make this go away "  A UA C and S was collected on 7/14 showing leuk esterase 500, WBC TNTC, RBC 5-10, trace blood, and trace bacteria. 100,000 colonies    Past Medical History:  Diagnosis Date  . Anemia, unspecified   . Asymptomatic varicose veins   . Carotid bruit    hx of  . Carotid bruit    doppler normal in the past  . Diverticulosis   . Dyslipidemia   . Esophageal reflux   . Female bladder prolapse, acquired 07/17/2010  . Fibroid   . Full incontinence of feces   . Hemorrhoid    1963 surgical excision  . History of colon cancer  Adenocarcinoma of right colon, stage T2, N1.s/p resection/chemotherapy 2002  . HTN (hypertension)   . Hx of colonic polyps    1965 surgical excision  . Hypothyroidism   . Idiopathic osteoporosis   . Idiopathic osteoporosis   . Internal hemorrhoids without mention of complication    bleed easily  . Irritable bowel syndrome   . Melanoma of skin, site unspecified   . MVP (mitral valve prolapse)   . OA (osteoarthritis)    left hip  . Osteoarthrosis, unspecified whether  generalized or localized, pelvic region and thigh   . Peripheral neuropathy    hands/ feet  . 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 Spinal stenosis, unspecified region other than cervical   . Vaginal bleeding, abnormal   . Vaginitis, atrophic   . Varicose veins    Past Surgical History:  Procedure Laterality Date  . COLONOSCOPY W/ POLYPECTOMY  2006   3 mm polyp destroyed, diverticulosis  . DILATION AND CURETTAGE OF UTERUS    . ESOPHAGOGASTRODUODENOSCOPY  2009   mild gastritis  . HEMICOLECTOMY  2002   right  . [place]  . HYSTEROSCOPY    . MELANOMA EXCISION      Allergies  Allergen Reactions  . Amoxil [Amoxicillin] Diarrhea    Has patient had a PCN reaction causing immediate rash, facial/tongue/throat swelling, SOB or lightheadedness with hypotension: no Has patient had a PCN reaction causing severe rash involving mucus membranes or skin necrosis: no Has patient had a PCN reaction that required hospitalization : no Has patient had a PCN reaction occurring within the last 10 years: no If all of the above answers are "NO", then may proceed with Cephalosporin use.   Otho Darner Allergy]     sensitivity  . Demerol     unknown  . Meperidine Hcl Other (See Comments)    Drop in blood pressure  . Infed [Iron Dextran] Rash    Became very red over face, scalp, and arms  . Neosporin [Neomycin-Bacitracin Zn-Polymyx] Rash    unknown    Outpatient Encounter Medications as of 01/03/2020  Medication Sig  . acetaminophen (TYLENOL) 650 MG CR tablet Take 1,300 mg by mouth 2 (two) times daily.  Marland Kitchen antiseptic oral rinse (BIOTENE) LIQD 15 mLs by Mouth Rinse route 3 (three) times daily.  . cholecalciferol (VITAMIN D) 1000 UNITS tablet Take 1 tablet (1,000 Units total) by mouth daily.  . Cranberry (ELLURA PO) Take 36 mg by mouth daily.  . Cranberry 400 MG CAPS Take 1 capsule by mouth 2 (two) times daily.  . Eyelid Cleansers (OCUSOFT EYELID CLEANSING) PADS Apply topically  as needed.  . gabapentin (NEURONTIN) 100 MG capsule Take 100 mg by mouth at bedtime.  . hydrocortisone (ANUSOL-HC) 2.5 % rectal cream Place 1 application rectally daily as needed for hemorrhoids or itching.   Marland Kitchen ketoconazole (NIZORAL) 2 % cream Apply 1 application topically 2 (two) times daily as needed for irritation. Apply b/l  to ears and surrounding skin ongoing for dermatitis  . levothyroxine (SYNTHROID, LEVOTHROID) 75 MCG tablet TAKE 1 TABLET DAILY BEFORE BREAKFAST FOR THYROID.  . Melatonin 5 MG TABS Take by mouth at bedtime as needed.   . methenamine (HIPREX) 1 g tablet Take 1 g by mouth at bedtime.  . miconazole (ZEASORB-AF) 2 % powder Apply topically daily. Groin area  . nystatin (NYSTATIN) powder Apply topically 2 (two) times daily. Apply to inguinal rash  . phenazopyridine (PYRIDIUM) 200 MG tablet Take 200 mg by mouth 3 (  three) times daily as needed for pain.  Vladimir Faster Glycol-Propyl Glycol (SYSTANE) 0.4-0.3 % SOLN Place 2 drops into the right eye 2 (two) times daily.  . potassium chloride SA (K-DUR) 20 MEQ tablet Take 20 mEq by mouth daily.  . Probiotic Product (ALIGN) 4 MG CAPS Take 1 tablet by mouth daily.    . vitamin C (ASCORBIC ACID) 500 MG tablet Take 500 mg by mouth daily.   No facility-administered encounter medications on file as of 01/03/2020.    Review of Systems  Constitutional: Negative for activity change, appetite change, chills, diaphoresis, fatigue, fever and unexpected weight change.  HENT: Positive for hearing loss. Negative for congestion.   Respiratory: Negative for cough, shortness of breath and wheezing.   Cardiovascular: Negative for chest pain, palpitations and leg swelling.  Gastrointestinal: Negative for abdominal distention, abdominal pain, constipation, diarrhea, nausea and rectal pain.  Genitourinary: Positive for difficulty urinating, dysuria, frequency, pelvic pain and urgency. Negative for decreased urine volume, flank pain, genital sores,  hematuria, vaginal bleeding and vaginal pain.  Musculoskeletal: Positive for gait problem. Negative for arthralgias, back pain, joint swelling and myalgias.  Skin: Positive for rash.  Neurological: Negative for dizziness, tremors, seizures, syncope, facial asymmetry, speech difficulty, weakness, light-headedness, numbness and headaches.  Psychiatric/Behavioral: Positive for confusion (occasional) and sleep disturbance. Negative for agitation and behavioral problems. The patient is nervous/anxious.     Immunization History  Administered Date(s) Administered  . H1N1 05/30/2008  . Influenza Whole 03/17/2000, 03/05/2010, 03/17/2012  . Influenza,inj,Quad PF,6+ Mos 03/14/2015, 04/07/2018, 04/15/2019  . Influenza-Unspecified 04/16/2013, 03/31/2014, 04/11/2016, 04/10/2017  . Moderna SARS-COVID-2 Vaccination 06/28/2019, 07/27/2019  . Pneumococcal Conjugate-13 09/08/2015  . Pneumococcal Polysaccharide-23 03/17/1997  . Tdap 10/09/2011  . Zoster Recombinat (Shingrix) 07/13/2017   Pertinent  Health Maintenance Due  Topic Date Due  . INFLUENZA VACCINE  01/16/2020  . DEXA SCAN  Completed  . PNA vac Low Risk Adult  Completed   Fall Risk  07/28/2019 02/12/2019 04/08/2018 01/06/2018 11/26/2017  Falls in the past year? - 0 No Yes No  Number falls in past yr: 0 0 - 1 -  Injury with Fall? 0 0 - No -  Risk for fall due to : - Impaired balance/gait - - -  Follow up - Falls evaluation completed - - -   Functional Status Survey:    There were no vitals filed for this visit. There is no height or weight on file to calculate BMI. Physical Exam Vitals and nursing note reviewed.  Constitutional:      General: She is not in acute distress.    Appearance: She is not diaphoretic.  HENT:     Head: Normocephalic and atraumatic.  Neck:     Vascular: No JVD.  Cardiovascular:     Rate and Rhythm: Normal rate and regular rhythm.     Heart sounds: No murmur heard.   Pulmonary:     Effort: Pulmonary effort is  normal. No respiratory distress.     Breath sounds: Normal breath sounds. No wheezing.  Abdominal:     General: Bowel sounds are normal. There is no distension.     Palpations: Abdomen is soft.     Tenderness: There is no abdominal tenderness. There is no right CVA tenderness or left CVA tenderness.     Hernia: There is no hernia in the left inguinal area or right inguinal area.  Genitourinary:    Exam position: Lithotomy position.     Pubic Area: Rash present.  Labia:        Right: Rash present. No tenderness, lesion or injury.        Left: Rash present. No tenderness, lesion or injury.      Urethra: Urethral swelling present.     Comments: atrophic Lymphadenopathy:     Lower Body: No right inguinal adenopathy. No left inguinal adenopathy.  Skin:    General: Skin is warm and dry.  Neurological:     Mental Status: She is alert and oriented to person, place, and time.  Psychiatric:     Comments: angry     Labs reviewed: No results for input(s): NA, K, CL, CO2, GLUCOSE, BUN, CREATININE, CALCIUM, MG, PHOS in the last 8760 hours. No results for input(s): AST, ALT, ALKPHOS, BILITOT, PROT, ALBUMIN in the last 8760 hours. No results for input(s): WBC, NEUTROABS, HGB, HCT, MCV, PLT in the last 8760 hours. Lab Results  Component Value Date   TSH 0.48 11/08/2019   Lab Results  Component Value Date   HGBA1C 5.9 12/02/2006   Lab Results  Component Value Date   CHOL 174 11/07/2015   HDL 79 (A) 11/07/2015   LDLCALC 78 11/07/2015   LDLDIRECT 123.7 12/08/2007   TRIG 87 11/07/2015   CHOLHDL 3 07/16/2011    Significant Diagnostic Results in last 30 days:  No results found.  Assessment/Plan  1. Vaginal candidiasis Diflucan 150 mg x 1 dose Clobetasol 0.05% bid x 4 weeks  2. Atrophic vaginitis Consider estrogen cream if no improvement is symptoms   3. Genitourinary syndrome of menopause It is unclear what is driving her symptoms at this point which are worse over the past  two weeks, preventing her from sleeping which is unfortunate because her main wish at 89 is to be comfortable. Symptoms improve with catheterization. Her vaginal area does appear swollen with more erythema so hopefully the above regimen will help. I still would consider antibiotic therapy if she does not improve but since her symptoms are only present at night I would lean against bacteria being the main cause   4. Urinary retention Keep catheter in all night 8p to 8 am and perform I and O each day at 1pm due to help with feelings of fullness and frequency and to help her sleep. May end up leaving in all the time if needed.  D/c kdur and tylenol per pt request   Family/ staff Communication: discussed with Ms. Police and her nurse Jasmine   Labs/tests ordered: F/U BMP (due to discontinuing Kdur per pt request)

## 2020-01-14 ENCOUNTER — Encounter: Payer: Self-pay | Admitting: Adult Health

## 2020-01-14 ENCOUNTER — Non-Acute Institutional Stay (SKILLED_NURSING_FACILITY): Payer: Medicare Other | Admitting: Adult Health

## 2020-01-14 DIAGNOSIS — N3000 Acute cystitis without hematuria: Secondary | ICD-10-CM | POA: Diagnosis not present

## 2020-01-14 DIAGNOSIS — K5901 Slow transit constipation: Secondary | ICD-10-CM | POA: Diagnosis not present

## 2020-01-14 NOTE — Progress Notes (Signed)
Location:  Occupational psychologist of Service:  SNF (31) Provider:   Cindi Carbon, ANP [place] [phone]   Gayland Curry, DO  Patient Care Team: Gayland Curry, DO as PCP - General (Geriatric Medicine) Terrance Mass, MD (Inactive) as Consulting Physician (Gynecology) Shon Hough, MD as Consulting Physician (Ophthalmology) Carolan Clines, MD (Inactive) as Consulting Physician (Urology) Gatha Mayer, MD as Consulting Physician (Gastroenterology) Wyatt Portela, MD as Consulting Physician (Oncology)  Extended Emergency Contact Information Primary Emergency Contact: Fox,Richard (Dr) Address: [address]          [place], [place] Phone: [phone] Work Phone: [phone] Relation: Alanson Puls Secondary Emergency Contact: Claudell Kyle Address: [place] [address] Johnnette Litter of [place] Mobile Phone: [phone] Relation: Son  Code Status: DNR Goals of care: Advanced Directive information Advanced Directives 12/23/2019  Does Patient Have a Medical Advance Directive? Yes  Type of Paramedic of [place];Out of facility DNR (pink MOST or yellow form)  Does patient want to make changes to medical advance directive? No - Patient declined  Copy of [place] in Chart? Yes - validated most recent copy scanned in chart (See row information)  Pre-existing out of facility DNR order (yellow form or pink MOST form) Pink MOST/Yellow Form most recent copy in chart - Physician notified to receive inpatient order     Chief Complaint  Patient presents with  . Acute Visit    bladder pressure and pain    HPI:  Pt is a 86 y.o. female seen today for an acute visit for bladder pressure and pain. She has  hx of atrophic vaginitis, urinary retention, prolapsed bladder, and incontinence. She has had these symptoms for a month. She reports  that she is not sleeping and feels "miserable".  She yells out at times for help at night due to the discomfort which is unusual for her. We have tried diflucan for vaginal irritation which helped, as well as antifungal cream. We have tried leaving her catheter in at night which initially helped but now she still feels pressure and discomfort. She is requesting an antibiotic. We originally did not treat her thinking she had asymptomatic bacteruria when a urine was collected on 7/14.    She has not had a fever and is otherwise in stable condition with normal vitals and appetite. She does report constipation today. She has small bms recorded each day in matrix.   A UA C and S was collected on 7/14 showing leuk esterase 500, WBC TNTC, RBC 5-10, trace blood, and trace bacteria. 100,000 colonies  Past Medical History:  Diagnosis Date  . Anemia, unspecified   . Asymptomatic varicose veins   . Carotid bruit    hx of  . Carotid bruit    doppler normal in the past  . Diverticulosis   . Dyslipidemia   . Esophageal reflux   . Female bladder prolapse, acquired 07/17/2010  . Fibroid   . Full incontinence of feces   . Hemorrhoid    1963 surgical excision  . History of colon cancer    Adenocarcinoma of right colon, stage T2, N1.s/p resection/chemotherapy 2002  . HTN (hypertension)   . Hx of colonic polyps    1965 surgical excision  . Hypothyroidism   . Idiopathic osteoporosis   . Idiopathic osteoporosis   . Internal hemorrhoids without mention  of complication    bleed easily  . Irritable bowel syndrome   . Melanoma of skin, site unspecified   . MVP (mitral valve prolapse)   . OA (osteoarthritis)    left hip  . Osteoarthrosis, unspecified whether generalized or localized, pelvic region and thigh   . Peripheral neuropathy    hands/ feet  . 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 Spinal stenosis, unspecified region other than cervical   . Vaginal bleeding, abnormal   . Vaginitis, atrophic   .  Varicose veins    Past Surgical History:  Procedure Laterality Date  . COLONOSCOPY W/ POLYPECTOMY  2006   3 mm polyp destroyed, diverticulosis  . DILATION AND CURETTAGE OF UTERUS    . ESOPHAGOGASTRODUODENOSCOPY  2009   mild gastritis  . HEMICOLECTOMY  2002   right  . [place]  . HYSTEROSCOPY    . MELANOMA EXCISION      Allergies  Allergen Reactions  . Amoxil [Amoxicillin] Diarrhea    Has patient had a PCN reaction causing immediate rash, facial/tongue/throat swelling, SOB or lightheadedness with hypotension: no Has patient had a PCN reaction causing severe rash involving mucus membranes or skin necrosis: no Has patient had a PCN reaction that required hospitalization : no Has patient had a PCN reaction occurring within the last 10 years: no If all of the above answers are "NO", then may proceed with Cephalosporin use.   Otho Darner Allergy]     sensitivity  . Demerol     unknown  . Meperidine Hcl Other (See Comments)    Drop in blood pressure  . Infed [Iron Dextran] Rash    Became very red over face, scalp, and arms  . Neosporin [Neomycin-Bacitracin Zn-Polymyx] Rash    unknown    Outpatient Encounter Medications as of 01/14/2020  Medication Sig  . acetaminophen (TYLENOL) 650 MG CR tablet Take 650 mg by mouth daily.   Marland Kitchen antiseptic oral rinse (BIOTENE) LIQD 15 mLs by Mouth Rinse route 3 (three) times daily.  . clobetasol ointment (TEMOVATE) 2.45 % Apply 1 application topically 2 (two) times daily. X 4 weeks  . Cranberry (ELLURA PO) Take 36 mg by mouth daily.  . Cranberry 400 MG CAPS Take 1 capsule by mouth 2 (two) times daily.  . Eyelid Cleansers (OCUSOFT EYELID CLEANSING) PADS Apply topically as needed.  . gabapentin (NEURONTIN) 100 MG capsule Take 100 mg by mouth at bedtime.  . hydrocortisone (ANUSOL-HC) 2.5 % rectal cream Place 1 application rectally daily as needed for hemorrhoids or itching.   Marland Kitchen ketoconazole (NIZORAL) 2 % cream Apply 1  application topically 2 (two) times daily as needed for irritation. Apply b/l  to ears and surrounding skin ongoing for dermatitis  . levothyroxine (SYNTHROID, LEVOTHROID) 75 MCG tablet TAKE 1 TABLET DAILY BEFORE BREAKFAST FOR THYROID.  . Melatonin 5 MG TABS Take by mouth at bedtime as needed.   . methenamine (HIPREX) 1 g tablet Take 1 g by mouth at bedtime.  . miconazole (ZEASORB-AF) 2 % powder Apply topically daily. Groin area  . nystatin (NYSTATIN) powder Apply topically 2 (two) times daily. Apply to inguinal rash  . phenazopyridine (PYRIDIUM) 200 MG tablet Take 200 mg by mouth 3 (three) times daily as needed for pain.  Vladimir Faster Glycol-Propyl Glycol (SYSTANE) 0.4-0.3 % SOLN Place 2 drops into the right eye 2 (two) times daily.  . Probiotic Product (ALIGN) 4 MG CAPS Take 1 tablet by mouth daily.     No facility-administered  encounter medications on file as of 01/14/2020.    Review of Systems  Constitutional: Negative for activity change, appetite change, chills, diaphoresis, fatigue, fever and unexpected weight change.  HENT: Negative for congestion.   Respiratory: Negative for cough, shortness of breath and wheezing.   Cardiovascular: Negative for chest pain, palpitations and leg swelling.  Gastrointestinal: Negative for abdominal distention, abdominal pain, constipation and diarrhea.  Genitourinary: Positive for difficulty urinating and dysuria. Negative for decreased urine volume, flank pain, frequency, urgency, vaginal bleeding, vaginal discharge and vaginal pain.       Pressure, discomfort, pain at the bladder  Musculoskeletal: Positive for arthralgias and gait problem. Negative for back pain, joint swelling and myalgias.  Skin: Negative for wound.  Neurological: Negative for dizziness, tremors, seizures, syncope, facial asymmetry, speech difficulty, weakness, light-headedness, numbness and headaches.  Psychiatric/Behavioral: Positive for confusion. Negative for agitation and  behavioral problems.    Immunization History  Administered Date(s) Administered  . H1N1 05/30/2008  . Influenza Whole 03/17/2000, 03/05/2010, 03/17/2012  . Influenza,inj,Quad PF,6+ Mos 03/14/2015, 04/07/2018, 04/15/2019  . Influenza-Unspecified 04/16/2013, 03/31/2014, 04/11/2016, 04/10/2017  . Moderna SARS-COVID-2 Vaccination 06/28/2019, 07/27/2019  . Pneumococcal Conjugate-13 09/08/2015  . Pneumococcal Polysaccharide-23 03/17/1997  . Tdap 10/09/2011  . Zoster Recombinat (Shingrix) 07/13/2017   Pertinent  Health Maintenance Due  Topic Date Due  . INFLUENZA VACCINE  01/16/2020  . DEXA SCAN  Completed  . PNA vac Low Risk Adult  Completed   Fall Risk  07/28/2019 02/12/2019 04/08/2018 01/06/2018 11/26/2017  Falls in the past year? - 0 No Yes No  Number falls in past yr: 0 0 - 1 -  Injury with Fall? 0 0 - No -  Risk for fall due to : - Impaired balance/gait - - -  Follow up - Falls evaluation completed - - -   Functional Status Survey:    There were no vitals filed for this visit. There is no height or weight on file to calculate BMI. Physical Exam Vitals and nursing note reviewed.  Constitutional:      General: She is not in acute distress.    Appearance: She is not diaphoretic.  HENT:     Head: Normocephalic and atraumatic.  Neck:     Vascular: No JVD.  Cardiovascular:     Rate and Rhythm: Normal rate and regular rhythm.     Heart sounds: No murmur heard.   Pulmonary:     Effort: Pulmonary effort is normal. No respiratory distress.     Breath sounds: Normal breath sounds. No wheezing.  Abdominal:     General: Bowel sounds are normal. There is no distension.     Tenderness: There is no abdominal tenderness. There is no right CVA tenderness or left CVA tenderness.     Comments: Slightly firm   Skin:    General: Skin is warm and dry.  Neurological:     Mental Status: She is alert and oriented to person, place, and time.  Psychiatric:        Mood and Affect: Mood  normal.     Labs reviewed: No results for input(s): NA, K, CL, CO2, GLUCOSE, BUN, CREATININE, CALCIUM, MG, PHOS in the last 8760 hours. No results for input(s): AST, ALT, ALKPHOS, BILITOT, PROT, ALBUMIN in the last 8760 hours. No results for input(s): WBC, NEUTROABS, HGB, HCT, MCV, PLT in the last 8760 hours. Lab Results  Component Value Date   TSH 0.48 11/08/2019   Lab Results  Component Value Date   HGBA1C 5.9  12/02/2006   Lab Results  Component Value Date   CHOL 174 11/07/2015   HDL 79 (A) 11/07/2015   LDLCALC 78 11/07/2015   LDLDIRECT 123.7 12/08/2007   TRIG 87 11/07/2015   CHOLHDL 3 07/16/2011    Significant Diagnostic Results in last 30 days:  No results found.  Assessment/Plan 1. Acute cystitis without hematuria No improvement in symptoms Will try cefpodoxime 100 mg bid x 7 days. She has tolerated Keflex in the past. PCN tend to cause diarrhea for her but no true allergy. If no improvement consider estrogen cream or follow up with urology. Hold Hiprex while on antibiotic.   She continues with the foley cath at night and in out cath during the day. The staff may flush the cath with NS 30 cc qd prn. If her symptoms improve may go mack to I and O cath q 6 hrs.   2. Slow transit constipation Give MOM 30 cc x 1 today per protocol Continue Miralax daily    Family/ staff Communication: discussed with the nurse Jasmine and the resident   Labs/tests ordered:  NA

## 2020-01-16 LAB — BASIC METABOLIC PANEL
BUN: 17 (ref 4–21)
CO2: 25 — AB (ref 13–22)
Chloride: 98 — AB (ref 99–108)
Creatinine: 0.6 (ref 0.5–1.1)
Glucose: 102
Potassium: 4.3 (ref 3.4–5.3)
Sodium: 137 (ref 137–147)

## 2020-01-16 LAB — COMPREHENSIVE METABOLIC PANEL: Calcium: 9.2 (ref 8.7–10.7)

## 2020-01-31 ENCOUNTER — Non-Acute Institutional Stay (SKILLED_NURSING_FACILITY): Payer: Medicare Other | Admitting: Adult Health

## 2020-01-31 DIAGNOSIS — R269 Unspecified abnormalities of gait and mobility: Secondary | ICD-10-CM

## 2020-01-31 DIAGNOSIS — M21372 Foot drop, left foot: Secondary | ICD-10-CM | POA: Diagnosis not present

## 2020-01-31 DIAGNOSIS — W19XXXA Unspecified fall, initial encounter: Secondary | ICD-10-CM | POA: Diagnosis not present

## 2020-02-02 ENCOUNTER — Encounter: Payer: Self-pay | Admitting: Adult Health

## 2020-02-02 DIAGNOSIS — M21372 Foot drop, left foot: Secondary | ICD-10-CM | POA: Insufficient documentation

## 2020-02-02 NOTE — Progress Notes (Signed)
Location:  Occupational psychologist of Service:  SNF (31) Provider:  Cindi Carbon, ANP [place] [phone]  Gayland Curry, DO  Patient Care Team: Gayland Curry, DO as PCP - General (Geriatric Medicine) Terrance Mass, MD (Inactive) as Consulting Physician (Gynecology) Shon Hough, MD as Consulting Physician (Ophthalmology) Carolan Clines, MD (Inactive) as Consulting Physician (Urology) Gatha Mayer, MD as Consulting Physician (Gastroenterology) Wyatt Portela, MD as Consulting Physician (Oncology)  Extended Emergency Contact Information Primary Emergency Contact: Fox,Richard (Dr) Address: [address]          [place], [place] Phone: [phone] Work Phone: [phone] Relation: Alanson Puls Secondary Emergency Contact: Claudell Kyle Address: [place] [address] Johnnette Litter of [place] Mobile Phone: [phone] Relation: Son  Code Status:  DNR Goals of care: Advanced Directive information Advanced Directives 12/23/2019  Does Patient Have a Medical Advance Directive? Yes  Type of Paramedic of [place];Out of facility DNR (pink MOST or yellow form)  Does patient want to make changes to medical advance directive? No - Patient declined  Copy of [place] in Chart? Yes - validated most recent copy scanned in chart (See row information)  Pre-existing out of facility DNR order (yellow form or pink MOST form) Pink MOST/Yellow Form most recent copy in chart - Physician notified to receive inpatient order     Chief Complaint  Patient presents with  . Acute Visit    fall    HPI:  Pt is a 86 y.o. female seen today for an acute visit for a fall. [name] Small is visually and hearing impaired. She has left foot drop and wears a brace, as well as uses a walker due to a shuffling gait. I received an SBAR written on 8/14 indicating  that she was getting up from the recliner and tripped and fell, hitting her head on the floor. There was a small scalp hematoma noted but no bleeding, LOC, joint pain, dizziness, or other concerns. Neuro checks and vitals have been stable.      Past Medical History:  Diagnosis Date  . Anemia, unspecified   . Asymptomatic varicose veins   . Carotid bruit    hx of  . Carotid bruit    doppler normal in the past  . Diverticulosis   . Dyslipidemia   . Esophageal reflux   . Female bladder prolapse, acquired 07/17/2010  . Fibroid   . Full incontinence of feces   . Hemorrhoid    1963 surgical excision  . History of colon cancer    Adenocarcinoma of right colon, stage T2, N1.s/p resection/chemotherapy 2002  . HTN (hypertension)   . Hx of colonic polyps    1965 surgical excision  . Hypothyroidism   . Idiopathic osteoporosis   . Idiopathic osteoporosis   . Internal hemorrhoids without mention of complication    bleed easily  . Irritable bowel syndrome   . Melanoma of skin, site unspecified   . MVP (mitral valve prolapse)   . OA (osteoarthritis)    left hip  . Osteoarthrosis, unspecified whether generalized or localized, pelvic region and thigh   . Peripheral neuropathy    hands/ feet  . 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 Spinal stenosis, unspecified region other than cervical   . Vaginal bleeding, abnormal   . Vaginitis, atrophic   . Varicose veins  Past Surgical History:  Procedure Laterality Date  . COLONOSCOPY W/ POLYPECTOMY  2006   3 mm polyp destroyed, diverticulosis  . DILATION AND CURETTAGE OF UTERUS    . ESOPHAGOGASTRODUODENOSCOPY  2009   mild gastritis  . HEMICOLECTOMY  2002   right  . [place]  . HYSTEROSCOPY    . MELANOMA EXCISION      Allergies  Allergen Reactions  . Amoxil [Amoxicillin] Diarrhea    Has patient had a PCN reaction causing immediate rash, facial/tongue/throat swelling, SOB or lightheadedness with hypotension: no Has  patient had a PCN reaction causing severe rash involving mucus membranes or skin necrosis: no Has patient had a PCN reaction that required hospitalization : no Has patient had a PCN reaction occurring within the last 10 years: no If all of the above answers are "NO", then may proceed with Cephalosporin use.   Otho Darner Allergy]     sensitivity  . Demerol     unknown  . Meperidine Hcl Other (See Comments)    Drop in blood pressure  . Infed [Iron Dextran] Rash    Became very red over face, scalp, and arms  . Neosporin [Neomycin-Bacitracin Zn-Polymyx] Rash    unknown    Outpatient Encounter Medications as of 01/31/2020  Medication Sig  . acetaminophen (TYLENOL) 650 MG CR tablet Take 650 mg by mouth daily.   Marland Kitchen antiseptic oral rinse (BIOTENE) LIQD 15 mLs by Mouth Rinse route 3 (three) times daily.  . clobetasol ointment (TEMOVATE) 8.46 % Apply 1 application topically 2 (two) times daily. X 4 weeks  . Cranberry (ELLURA PO) Take 36 mg by mouth daily.  . Cranberry 400 MG CAPS Take 1 capsule by mouth 2 (two) times daily.  . Eyelid Cleansers (OCUSOFT EYELID CLEANSING) PADS Apply topically as needed.  . gabapentin (NEURONTIN) 100 MG capsule Take 100 mg by mouth at bedtime.  . hydrocortisone (ANUSOL-HC) 2.5 % rectal cream Place 1 application rectally daily as needed for hemorrhoids or itching.   Marland Kitchen ketoconazole (NIZORAL) 2 % cream Apply 1 application topically 2 (two) times daily as needed for irritation. Apply b/l  to ears and surrounding skin ongoing for dermatitis  . levothyroxine (SYNTHROID, LEVOTHROID) 75 MCG tablet TAKE 1 TABLET DAILY BEFORE BREAKFAST FOR THYROID.  . Melatonin 5 MG TABS Take by mouth at bedtime as needed.   . methenamine (HIPREX) 1 g tablet Take 1 g by mouth at bedtime.  . miconazole (ZEASORB-AF) 2 % powder Apply topically daily. Groin area  . nystatin (NYSTATIN) powder Apply topically 2 (two) times daily. Apply to inguinal rash  . phenazopyridine (PYRIDIUM) 200 MG  tablet Take 200 mg by mouth 3 (three) times daily as needed for pain.  Vladimir Faster Glycol-Propyl Glycol (SYSTANE) 0.4-0.3 % SOLN Place 2 drops into the right eye 2 (two) times daily.  . polyethylene glycol (MIRALAX / GLYCOLAX) 17 g packet Take 17 g by mouth daily.  . Probiotic Product (ALIGN) 4 MG CAPS Take 1 tablet by mouth daily.     No facility-administered encounter medications on file as of 01/31/2020.    Review of Systems  Constitutional: Negative for activity change, appetite change, chills, diaphoresis, fatigue, fever and unexpected weight change.  HENT: Negative for congestion.   Eyes: Negative for visual disturbance.  Respiratory: Negative for cough and shortness of breath.   Cardiovascular: Negative for chest pain and leg swelling.  Musculoskeletal: Positive for gait problem. Negative for arthralgias and back pain.  Neurological: Negative for dizziness,  weakness and headaches.  Psychiatric/Behavioral: Positive for confusion (unchanged). Negative for agitation and behavioral problems.    Immunization History  Administered Date(s) Administered  . H1N1 05/30/2008  . Influenza Whole 03/17/2000, 03/05/2010, 03/17/2012  . Influenza,inj,Quad PF,6+ Mos 03/14/2015, 04/07/2018, 04/15/2019  . Influenza-Unspecified 04/16/2013, 03/31/2014, 04/11/2016, 04/10/2017  . Moderna SARS-COVID-2 Vaccination 06/28/2019, 07/27/2019  . Pneumococcal Conjugate-13 09/08/2015  . Pneumococcal Polysaccharide-23 03/17/1997  . Tdap 10/09/2011  . Zoster Recombinat (Shingrix) 07/13/2017   Pertinent  Health Maintenance Due  Topic Date Due  . INFLUENZA VACCINE  01/16/2020  . DEXA SCAN  Completed  . PNA vac Low Risk Adult  Completed   Fall Risk  07/28/2019 02/12/2019 04/08/2018 01/06/2018 11/26/2017  Falls in the past year? - 0 No Yes No  Number falls in past yr: 0 0 - 1 -  Injury with Fall? 0 0 - No -  Risk for fall due to : - Impaired balance/gait - - -  Follow up - Falls evaluation completed - - -    Functional Status Survey:    Vitals:   02/02/20 1525  BP: 110/67  Pulse: 69  Resp: 17  Temp: 97.7 F (36.5 C)  SpO2: 91%   There is no height or weight on file to calculate BMI. Physical Exam Vitals and nursing note reviewed.  Constitutional:      Appearance: Normal appearance.  HENT:     Head: Normocephalic and atraumatic.  Cardiovascular:     Rate and Rhythm: Normal rate and regular rhythm.     Heart sounds: Murmur heard.   Pulmonary:     Effort: Pulmonary effort is normal.     Breath sounds: Normal breath sounds.  Abdominal:     General: Bowel sounds are normal. There is no distension.     Palpations: Abdomen is soft.     Tenderness: There is no right CVA tenderness or left CVA tenderness.  Musculoskeletal:        General: No swelling, tenderness, deformity or signs of injury.     Right lower leg: No edema.     Left lower leg: No edema.  Skin:    General: Skin is warm and dry.  Neurological:     General: No focal deficit present.     Mental Status: She is alert. Mental status is at baseline.  Psychiatric:        Mood and Affect: Mood normal.     Labs reviewed: No results for input(s): NA, K, CL, CO2, GLUCOSE, BUN, CREATININE, CALCIUM, MG, PHOS in the last 8760 hours. No results for input(s): AST, ALT, ALKPHOS, BILITOT, PROT, ALBUMIN in the last 8760 hours. No results for input(s): WBC, NEUTROABS, HGB, HCT, MCV, PLT in the last 8760 hours. Lab Results  Component Value Date   TSH 0.48 11/08/2019   Lab Results  Component Value Date   HGBA1C 5.9 12/02/2006   Lab Results  Component Value Date   CHOL 174 11/07/2015   HDL 79 (A) 11/07/2015   LDLCALC 78 11/07/2015   LDLDIRECT 123.7 12/08/2007   TRIG 87 11/07/2015   CHOLHDL 3 07/16/2011    Significant Diagnostic Results in last 30 days:  No results found.  Assessment/Plan 1. Fall, initial encounter Mechanical in nature due to multiple factors including visual impairment, foot drop, and gait  abnormality. Luckily, there are no signs of trauma at this time. She had a small hematoma to her left scalp which has resolved.  Recommend walker use at all times, close monitoring, fall prec  2. Gait abnormality As above.   3. Left foot drop Increases fall risk. Should wear brace at all times with ambulation. Monitor skin for pressure areas.     Family/ staff Communication: nurse and resident   Labs/tests ordered:  NA

## 2020-03-06 ENCOUNTER — Non-Acute Institutional Stay (SKILLED_NURSING_FACILITY): Payer: Medicare Other | Admitting: Adult Health

## 2020-03-06 DIAGNOSIS — G609 Hereditary and idiopathic neuropathy, unspecified: Secondary | ICD-10-CM | POA: Diagnosis not present

## 2020-03-06 DIAGNOSIS — R682 Dry mouth, unspecified: Secondary | ICD-10-CM

## 2020-03-06 DIAGNOSIS — I10 Essential (primary) hypertension: Secondary | ICD-10-CM

## 2020-03-06 DIAGNOSIS — H903 Sensorineural hearing loss, bilateral: Secondary | ICD-10-CM | POA: Diagnosis not present

## 2020-03-06 DIAGNOSIS — M21372 Foot drop, left foot: Secondary | ICD-10-CM

## 2020-03-06 DIAGNOSIS — R339 Retention of urine, unspecified: Secondary | ICD-10-CM

## 2020-03-06 DIAGNOSIS — F0390 Unspecified dementia without behavioral disturbance: Secondary | ICD-10-CM

## 2020-03-09 ENCOUNTER — Encounter: Payer: Self-pay | Admitting: Adult Health

## 2020-03-09 NOTE — Progress Notes (Signed)
Location:  Occupational psychologist of Service:  SNF (31) Provider:  Cindi Carbon, ANP [place] [phone]  Gayland Curry, DO  Patient Care Team: Gayland Curry, DO as PCP - General (Geriatric Medicine) Terrance Mass, MD (Inactive) as Consulting Physician (Gynecology) Shon Hough, MD as Consulting Physician (Ophthalmology) Carolan Clines, MD (Inactive) as Consulting Physician (Urology) Gatha Mayer, MD as Consulting Physician (Gastroenterology) Wyatt Portela, MD as Consulting Physician (Oncology)  Extended Emergency Contact Information Primary Emergency Contact: Fox,Richard (Dr) Address: [address]          [place], [place] Phone: [phone] Work Phone: [phone] Relation: Alanson Puls Secondary Emergency Contact: Claudell Kyle Address: [place] [address] Johnnette Litter of [place] Mobile Phone: [phone] Relation: Son  Code Status:  DNR Goals of care: Advanced Directive information Advanced Directives 12/23/2019  Does Patient Have a Medical Advance Directive? Yes  Type of Paramedic of [place];Out of facility DNR (pink MOST or yellow form)  Does patient want to make changes to medical advance directive? No - Patient declined  Copy of [place] in Chart? Yes - validated most recent copy scanned in chart (See row information)  Pre-existing out of facility DNR order (yellow form or pink MOST form) Pink MOST/Yellow Form most recent copy in chart - Physician notified to receive inpatient order     Chief Complaint  Patient presents with  . Medical Management of Chronic Issues    HPI:  Pt is a 86 y.o. female seen today for medical management of chronic diseases.    Ms. Chauca is turning 89 this week. She reports that she is feeling well and has no acute complaints. She has chronic urinary retention and current  cystitis but for my visit today she is comfortable with no bladder pain or urgency. Her hearing continues to be an issues despite hearing aides. We use a white board to communicate with her. She has chronic neuropathy and left foot drop and uses neurontin for pain which is controlled and wears a brace on the left foot. She is at risk for falls and had a fall last month.  Over the past few months the staff has noticed that she is more forgetful and can be confused late afternoon regarding her location.   Past Medical History:  Diagnosis Date  . Anemia, unspecified   . Asymptomatic varicose veins   . Carotid bruit    hx of  . Carotid bruit    doppler normal in the past  . Diverticulosis   . Dyslipidemia   . Esophageal reflux   . Female bladder prolapse, acquired 07/17/2010  . Fibroid   . Full incontinence of feces   . Hemorrhoid    1963 surgical excision  . History of colon cancer    Adenocarcinoma of right colon, stage T2, N1.s/p resection/chemotherapy 2002  . HTN (hypertension)   . Hx of colonic polyps    1965 surgical excision  . Hypothyroidism   . Idiopathic osteoporosis   . Idiopathic osteoporosis   . Internal hemorrhoids without mention of complication    bleed easily  . Irritable bowel syndrome   . Melanoma of skin, site unspecified   . MVP (mitral valve prolapse)   . OA (osteoarthritis)    left hip  . Osteoarthrosis, unspecified whether generalized or localized, pelvic region and thigh   .  Peripheral neuropathy    hands/ feet  . 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 Spinal stenosis, unspecified region other than cervical   . Vaginal bleeding, abnormal   . Vaginitis, atrophic   . Varicose veins    Past Surgical History:  Procedure Laterality Date  . COLONOSCOPY W/ POLYPECTOMY  2006   3 mm polyp destroyed, diverticulosis  . DILATION AND CURETTAGE OF UTERUS    . ESOPHAGOGASTRODUODENOSCOPY  2009   mild gastritis  . HEMICOLECTOMY  2002   right  . [place]  . HYSTEROSCOPY    . MELANOMA EXCISION      Allergies  Allergen Reactions  . Amoxil [Amoxicillin] Diarrhea    Has patient had a PCN reaction causing immediate rash, facial/tongue/throat swelling, SOB or lightheadedness with hypotension: no Has patient had a PCN reaction causing severe rash involving mucus membranes or skin necrosis: no Has patient had a PCN reaction that required hospitalization : no Has patient had a PCN reaction occurring within the last 10 years: no If all of the above answers are "NO", then may proceed with Cephalosporin use.   Otho Darner Allergy]     sensitivity  . Demerol     unknown  . Meperidine Hcl Other (See Comments)    Drop in blood pressure  . Infed [Iron Dextran] Rash    Became very red over face, scalp, and arms  . Neosporin [Neomycin-Bacitracin Zn-Polymyx] Rash    unknown    Outpatient Encounter Medications as of 03/06/2020  Medication Sig  . acetaminophen (TYLENOL) 650 MG CR tablet Take 650 mg by mouth daily.   Marland Kitchen antiseptic oral rinse (BIOTENE) LIQD 15 mLs by Mouth Rinse route 3 (three) times daily.  . Cranberry (ELLURA PO) Take 36 mg by mouth daily.  . Cranberry 400 MG CAPS Take 1 capsule by mouth 2 (two) times daily.  . Eyelid Cleansers (OCUSOFT EYELID CLEANSING) PADS Apply topically as needed.  . gabapentin (NEURONTIN) 100 MG capsule Take 100 mg by mouth at bedtime.  . hydrocortisone (ANUSOL-HC) 2.5 % rectal cream Place 1 application rectally daily as needed for hemorrhoids or itching.   Marland Kitchen ketoconazole (NIZORAL) 2 % cream Apply 1 application topically 2 (two) times daily as needed for irritation. Apply b/l  to ears and surrounding skin ongoing for dermatitis  . levothyroxine (SYNTHROID, LEVOTHROID) 75 MCG tablet TAKE 1 TABLET DAILY BEFORE BREAKFAST FOR THYROID.  . Melatonin 5 MG TABS Take by mouth at bedtime as needed.   . methenamine (HIPREX) 1 g tablet Take 1 g by mouth at bedtime.  . miconazole (ZEASORB-AF) 2 % powder Apply  topically daily. Groin area  . nystatin (NYSTATIN) powder Apply topically 2 (two) times daily. Apply to inguinal rash  . phenazopyridine (PYRIDIUM) 200 MG tablet Take 200 mg by mouth 3 (three) times daily as needed for pain.  Vladimir Faster Glycol-Propyl Glycol (SYSTANE) 0.4-0.3 % SOLN Place 2 drops into the right eye 2 (two) times daily.  . polyethylene glycol (MIRALAX / GLYCOLAX) 17 g packet Take 17 g by mouth daily.  . Probiotic Product (ALIGN) 4 MG CAPS Take 1 tablet by mouth daily.    . [DISCONTINUED] clobetasol ointment (TEMOVATE) 3.66 % Apply 1 application topically 2 (two) times daily. X 4 weeks   No facility-administered encounter medications on file as of 03/06/2020.    Review of Systems  Constitutional: Negative for activity change, appetite change, chills, diaphoresis, fatigue, fever and unexpected weight change.  HENT: Positive for hearing loss.  Negative for congestion.        Dry mouth  Respiratory: Negative for cough, shortness of breath and wheezing.   Cardiovascular: Negative for chest pain, palpitations and leg swelling.  Gastrointestinal: Negative for abdominal distention, abdominal pain, constipation and diarrhea.  Genitourinary: Negative for difficulty urinating, dysuria, flank pain, frequency, hematuria, menstrual problem, pelvic pain and urgency.  Musculoskeletal: Positive for gait problem. Negative for arthralgias, back pain, joint swelling and myalgias.  Skin: Negative for wound.  Neurological: Negative for dizziness, tremors, seizures, syncope, facial asymmetry, speech difficulty, weakness, light-headedness, numbness (left foot drop) and headaches.  Psychiatric/Behavioral: Positive for confusion. Negative for agitation and behavioral problems.    Immunization History  Administered Date(s) Administered  . H1N1 05/30/2008  . Influenza Whole 03/17/2000, 03/05/2010, 03/17/2012  . Influenza,inj,Quad PF,6+ Mos 03/14/2015, 04/07/2018, 04/15/2019  . Influenza-Unspecified  04/16/2013, 03/31/2014, 04/11/2016, 04/10/2017  . Moderna SARS-COVID-2 Vaccination 06/28/2019, 07/27/2019  . Pneumococcal Conjugate-13 09/08/2015  . Pneumococcal Polysaccharide-23 03/17/1997  . Tdap 10/09/2011  . Zoster Recombinat (Shingrix) 07/13/2017   Pertinent  Health Maintenance Due  Topic Date Due  . INFLUENZA VACCINE  01/16/2020  . DEXA SCAN  Completed  . PNA vac Low Risk Adult  Completed   Fall Risk  07/28/2019 02/12/2019 04/08/2018 01/06/2018 11/26/2017  Falls in the past year? - 0 No Yes No  Number falls in past yr: 0 0 - 1 -  Injury with Fall? 0 0 - No -  Risk for fall due to : - Impaired balance/gait - - -  Follow up - Falls evaluation completed - - -   Functional Status Survey:    There were no vitals filed for this visit. There is no height or weight on file to calculate BMI. Physical Exam Vitals and nursing note reviewed.  Constitutional:      General: She is not in acute distress.    Appearance: She is not diaphoretic.  HENT:     Head: Normocephalic and atraumatic.     Mouth/Throat:     Mouth: Mucous membranes are dry.  Neck:     Vascular: No JVD.  Cardiovascular:     Rate and Rhythm: Normal rate and regular rhythm.     Heart sounds: No murmur heard.   Pulmonary:     Effort: Pulmonary effort is normal. No respiratory distress.     Breath sounds: Rhonchi present. No wheezing.  Abdominal:     General: Bowel sounds are normal. There is no distension.     Palpations: Abdomen is soft.  Musculoskeletal:     Right lower leg: No edema.     Left lower leg: No edema.  Skin:    General: Skin is warm and dry.  Neurological:     Mental Status: She is alert.     Comments: Oriented x 2  Psychiatric:        Mood and Affect: Mood normal.     Labs reviewed: No results for input(s): NA, K, CL, CO2, GLUCOSE, BUN, CREATININE, CALCIUM, MG, PHOS in the last 8760 hours. No results for input(s): AST, ALT, ALKPHOS, BILITOT, PROT, ALBUMIN in the last 8760 hours. No  results for input(s): WBC, NEUTROABS, HGB, HCT, MCV, PLT in the last 8760 hours. Lab Results  Component Value Date   TSH 0.48 11/08/2019   Lab Results  Component Value Date   HGBA1C 5.9 12/02/2006   Lab Results  Component Value Date   CHOL 174 11/07/2015   HDL 79 (A) 11/07/2015   LDLCALC 78 11/07/2015  LDLDIRECT 123.7 12/08/2007   TRIG 87 11/07/2015   CHOLHDL 3 07/16/2011    Significant Diagnostic Results in last 30 days:  No results found.  Assessment/Plan 1. Essential hypertension Controlled with out medications   2. Dry mouth Biotene 15 ml tid  3. Hereditary and idiopathic peripheral neuropathy Controlled pain with neurontin 100 mg qhs  4. Sensorineural hearing loss (SNHL) of both ears Wears hearing aids but continues to have difficulty communicating   5. Dementia without behavioral disturbance, unspecified dementia type ([place]) Progressive short term memory loss and confusion about the details of her care.   6. Left foot drop Chronic, wears brace   7. Urinary retention Continue Foley each evening and I and O cath during the day.     Family/ staff Communication: resident  Labs/tests ordered:  NA

## 2020-03-30 ENCOUNTER — Non-Acute Institutional Stay (SKILLED_NURSING_FACILITY): Payer: Medicare Other | Admitting: Adult Health

## 2020-03-30 ENCOUNTER — Encounter: Payer: Self-pay | Admitting: Adult Health

## 2020-03-30 DIAGNOSIS — G609 Hereditary and idiopathic neuropathy, unspecified: Secondary | ICD-10-CM | POA: Diagnosis not present

## 2020-03-30 DIAGNOSIS — F0390 Unspecified dementia without behavioral disturbance: Secondary | ICD-10-CM

## 2020-03-30 DIAGNOSIS — H903 Sensorineural hearing loss, bilateral: Secondary | ICD-10-CM

## 2020-03-30 DIAGNOSIS — M21372 Foot drop, left foot: Secondary | ICD-10-CM

## 2020-03-30 DIAGNOSIS — E039 Hypothyroidism, unspecified: Secondary | ICD-10-CM

## 2020-03-30 DIAGNOSIS — I10 Essential (primary) hypertension: Secondary | ICD-10-CM

## 2020-03-30 DIAGNOSIS — N952 Postmenopausal atrophic vaginitis: Secondary | ICD-10-CM

## 2020-03-30 NOTE — Progress Notes (Signed)
Location:  Occupational psychologist of Service:  SNF (31) Provider:  Cindi Carbon, ANP [place] [phone]  Gayland Curry, DO  Patient Care Team: Gayland Curry, DO as PCP - General (Geriatric Medicine) Terrance Mass, MD (Inactive) as Consulting Physician (Gynecology) Shon Hough, MD as Consulting Physician (Ophthalmology) Carolan Clines, MD (Inactive) as Consulting Physician (Urology) Gatha Mayer, MD as Consulting Physician (Gastroenterology) Wyatt Portela, MD as Consulting Physician (Oncology)  Extended Emergency Contact Information Primary Emergency Contact: Fox,Richard (Dr) Address: [address]          [place], [place] of [place] Phone: [phone] Work Phone: [phone] Relation: Alanson Puls Secondary Emergency Contact: Claudell Kyle Address: [place] [address] Johnnette Litter of [place] Mobile Phone: [phone] Relation: Son  Code Status:  DNR Goals of care: Advanced Directive information Advanced Directives 12/23/2019  Does Patient Have a Medical Advance Directive? Yes  Type of Paramedic of [place];Out of facility DNR (pink MOST or yellow form)  Does patient want to make changes to medical advance directive? No - Patient declined  Copy of [place] in Chart? Yes - validated most recent copy scanned in chart (See row information)  Pre-existing out of facility DNR order (yellow form or pink MOST form) Pink MOST/Yellow Form most recent copy in chart - Physician notified to receive inpatient order     Chief Complaint  Patient presents with  . Medical Management of Chronic Issues    HPI:  Pt is a 86 y.o. female seen today for medical management of chronic diseases.    Urinary retention: denies any bladder pain, fever, or dysuria. Continues with foley during the night and I/O cath during the day.   Neuropathy: wears  left foot brace for foot drop Denies any foot pain  HOH, some confusion with reminders and cuing needed. She remains ambulatory with a walker, able to communicate, and has no issues with agitation   Hypothyroidism: Lab Results  Component Value Date   TSH 0.48 11/08/2019   Bowels are moving well   Past Medical History:  Diagnosis Date  . Anemia, unspecified   . Asymptomatic varicose veins   . Carotid bruit    hx of  . Carotid bruit    doppler normal in the past  . Diverticulosis   . Dyslipidemia   . Esophageal reflux   . Female bladder prolapse, acquired 07/17/2010  . Fibroid   . Full incontinence of feces   . Hemorrhoid    1963 surgical excision  . History of colon cancer    Adenocarcinoma of right colon, stage T2, N1.s/p resection/chemotherapy 2002  . HTN (hypertension)   . Hx of colonic polyps    1965 surgical excision  . Hypothyroidism   . Idiopathic osteoporosis   . Idiopathic osteoporosis   . Internal hemorrhoids without mention of complication    bleed easily  . Irritable bowel syndrome   . Melanoma of skin, site unspecified   . MVP (mitral valve prolapse)   . OA (osteoarthritis)    left hip  . Osteoarthrosis, unspecified whether generalized or localized, pelvic region and thigh   . Peripheral neuropathy    hands/ feet  . 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 Spinal stenosis, unspecified region other than cervical   . Vaginal bleeding, abnormal   . Vaginitis, atrophic   . Varicose veins  Past Surgical History:  Procedure Laterality Date  . COLONOSCOPY W/ POLYPECTOMY  2006   3 mm polyp destroyed, diverticulosis  . DILATION AND CURETTAGE OF UTERUS    . ESOPHAGOGASTRODUODENOSCOPY  2009   mild gastritis  . HEMICOLECTOMY  2002   right  . [place]  . HYSTEROSCOPY    . MELANOMA EXCISION      Allergies  Allergen Reactions  . Amoxil [Amoxicillin] Diarrhea    Has patient had a PCN reaction causing immediate rash, facial/tongue/throat  swelling, SOB or lightheadedness with hypotension: no Has patient had a PCN reaction causing severe rash involving mucus membranes or skin necrosis: no Has patient had a PCN reaction that required hospitalization : no Has patient had a PCN reaction occurring within the last 10 years: no If all of the above answers are "NO", then may proceed with Cephalosporin use.   Otho Darner Allergy]     sensitivity  . Demerol     unknown  . Meperidine Hcl Other (See Comments)    Drop in blood pressure  . Infed [Iron Dextran] Rash    Became very red over face, scalp, and arms  . Neosporin [Neomycin-Bacitracin Zn-Polymyx] Rash    unknown    Outpatient Encounter Medications as of 03/30/2020  Medication Sig  . antiseptic oral rinse (BIOTENE) LIQD 15 mLs by Mouth Rinse route 3 (three) times daily.  . Cranberry 400 MG CAPS Take 1 capsule by mouth 2 (two) times daily.  Marland Kitchen gabapentin (NEURONTIN) 100 MG capsule Take 100 mg by mouth at bedtime.  . hydrocortisone (ANUSOL-HC) 2.5 % rectal cream Place 1 application rectally daily as needed for hemorrhoids or itching.   Marland Kitchen ketoconazole (NIZORAL) 2 % cream Apply 1 application topically 2 (two) times daily as needed for irritation. Apply b/l  to ears and surrounding skin ongoing for dermatitis  . levothyroxine (SYNTHROID, LEVOTHROID) 75 MCG tablet TAKE 1 TABLET DAILY BEFORE BREAKFAST FOR THYROID.  Marland Kitchen lidocaine (XYLOCAINE) 2 % jelly 1 application 3 (three) times daily as needed.  . Melatonin 5 MG TABS Take by mouth at bedtime as needed.   . methenamine (HIPREX) 1 g tablet Take 1 g by mouth at bedtime.  . miconazole (ZEASORB-AF) 2 % powder Apply topically daily. Groin area  . nystatin (NYSTATIN) powder Apply topically 2 (two) times daily. Apply to inguinal rash  . phenazopyridine (PYRIDIUM) 200 MG tablet Take 200 mg by mouth 3 (three) times daily as needed for pain.  Vladimir Faster Glycol-Propyl Glycol (SYSTANE) 0.4-0.3 % SOLN Place 2 drops into the right eye 2  (two) times daily.  . polyethylene glycol (MIRALAX / GLYCOLAX) 17 g packet Take 17 g by mouth daily.  . Probiotic Product (ALIGN) 4 MG CAPS Take 1 tablet by mouth daily.    Marland Kitchen acetaminophen (TYLENOL) 650 MG CR tablet Take 650 mg by mouth daily.   . Cranberry (ELLURA PO) Take 36 mg by mouth daily.  . Eyelid Cleansers (OCUSOFT EYELID CLEANSING) PADS Apply topically as needed.   No facility-administered encounter medications on file as of 03/30/2020.    Review of Systems  Constitutional: Negative for activity change, appetite change, chills, diaphoresis, fatigue, fever and unexpected weight change.  HENT: Positive for hearing loss. Negative for congestion.        Dry mouth  Respiratory: Negative for cough, shortness of breath and wheezing.   Cardiovascular: Negative for chest pain, palpitations and leg swelling.  Gastrointestinal: Negative for abdominal distention, abdominal pain, constipation and diarrhea.  Genitourinary: Negative for difficulty urinating, dysuria, flank pain, frequency, hematuria, menstrual problem, pelvic pain and urgency.  Musculoskeletal: Positive for gait problem. Negative for arthralgias, back pain, joint swelling and myalgias.  Skin: Negative for wound.  Neurological: Negative for dizziness, tremors, seizures, syncope, facial asymmetry, speech difficulty, weakness, light-headedness, numbness (left foot drop) and headaches.  Psychiatric/Behavioral: Positive for confusion. Negative for agitation and behavioral problems.    Immunization History  Administered Date(s) Administered  . H1N1 05/30/2008  . Influenza Whole 03/17/2000, 03/05/2010, 03/17/2012  . Influenza,inj,Quad PF,6+ Mos 03/14/2015, 04/07/2018, 04/15/2019  . Influenza-Unspecified 04/16/2013, 03/31/2014, 04/11/2016, 04/10/2017  . Moderna SARS-COVID-2 Vaccination 06/28/2019, 07/27/2019  . Pneumococcal Conjugate-13 09/08/2015  . Pneumococcal Polysaccharide-23 03/17/1997  . Tdap 10/09/2011  . Zoster  Recombinat (Shingrix) 07/13/2017   Pertinent  Health Maintenance Due  Topic Date Due  . INFLUENZA VACCINE  01/16/2020  . DEXA SCAN  Completed  . PNA vac Low Risk Adult  Completed   Fall Risk  07/28/2019 02/12/2019 04/08/2018 01/06/2018 11/26/2017  Falls in the past year? - 0 No Yes No  Number falls in past yr: 0 0 - 1 -  Injury with Fall? 0 0 - No -  Risk for fall due to : - Impaired balance/gait - - -  Follow up - Falls evaluation completed - - -   Functional Status Survey:    Vitals:   03/30/20 1653  Weight: 105 lb 4.8 oz (47.8 kg)   Body mass index is 22.01 kg/m.  Wt Readings from Last 3 Encounters:  03/30/20 105 lb 4.8 oz (47.8 kg)  12/23/19 107 lb 3.2 oz (48.6 kg)  11/04/19 107 lb 3.2 oz (48.6 kg)    Physical Exam Vitals and nursing note reviewed.  Constitutional:      General: She is not in acute distress.    Appearance: She is not diaphoretic.  HENT:     Head: Normocephalic and atraumatic.     Mouth/Throat:     Mouth: Mucous membranes are dry.  Neck:     Vascular: No JVD.  Cardiovascular:     Rate and Rhythm: Normal rate and regular rhythm.     Heart sounds: No murmur heard.   Pulmonary:     Effort: Pulmonary effort is normal. No respiratory distress.     Breath sounds: Rhonchi (scattered) present. No wheezing.  Abdominal:     General: Bowel sounds are normal. There is no distension.     Palpations: Abdomen is soft.  Musculoskeletal:     Right lower leg: No edema.     Left lower leg: No edema.  Skin:    General: Skin is warm and dry.  Neurological:     Mental Status: She is alert.     Comments: Oriented x 2  Psychiatric:        Mood and Affect: Mood normal.     Labs reviewed: Recent Labs    01/16/20 0000  NA 137  K 4.3  CL 98*  CO2 25*  BUN 17  CREATININE 0.6  CALCIUM 9.2   No results for input(s): AST, ALT, ALKPHOS, BILITOT, PROT, ALBUMIN in the last 8760 hours. No results for input(s): WBC, NEUTROABS, HGB, HCT, MCV, PLT in the last  8760 hours. Lab Results  Component Value Date   TSH 0.48 11/08/2019   Lab Results  Component Value Date   HGBA1C 5.9 12/02/2006   Lab Results  Component Value Date   CHOL 174 11/07/2015   HDL 79 (A) 11/07/2015   LDLCALC  78 11/07/2015   LDLDIRECT 123.7 12/08/2007   TRIG 87 11/07/2015   CHOLHDL 3 07/16/2011    Significant Diagnostic Results in last 30 days:  No results found.  Assessment/Plan  1. Primary hypertension Controlled  2. Dementia without behavioral disturbance, unspecified dementia type ([place]) Has difficulty filling out MMSE due to hearing and visual losses. More confusion noted over the past year but remains pleasant. Continue supportive care only.   3. Hereditary and idiopathic peripheral neuropathy Denies pain Continue Neurontin 100 mg qhs   4. Sensorineural hearing loss (SNHL) of both ears Makes communication very difficulty for her. She has success using a white board in her room  5. Atrophic vaginitis Contributes to symptoms of dysuria and frequency  6. Left foot drop Continue brace to left foot. Fall prec  7. Hypothyroidism, unspecified type Continue Synthroid 75 mcg qd    Family/ staff Communication: resident  Labs/tests ordered:  NA

## 2020-05-05 ENCOUNTER — Encounter: Payer: Self-pay | Admitting: Adult Health

## 2020-05-05 ENCOUNTER — Non-Acute Institutional Stay (SKILLED_NURSING_FACILITY): Payer: Medicare Other | Admitting: Adult Health

## 2020-05-05 DIAGNOSIS — K5909 Other constipation: Secondary | ICD-10-CM | POA: Diagnosis not present

## 2020-05-05 DIAGNOSIS — G609 Hereditary and idiopathic neuropathy, unspecified: Secondary | ICD-10-CM | POA: Diagnosis not present

## 2020-05-05 DIAGNOSIS — I10 Essential (primary) hypertension: Secondary | ICD-10-CM

## 2020-05-05 DIAGNOSIS — R339 Retention of urine, unspecified: Secondary | ICD-10-CM | POA: Diagnosis not present

## 2020-05-05 DIAGNOSIS — F0390 Unspecified dementia without behavioral disturbance: Secondary | ICD-10-CM

## 2020-05-05 DIAGNOSIS — R682 Dry mouth, unspecified: Secondary | ICD-10-CM

## 2020-05-05 NOTE — Progress Notes (Signed)
Location:  Occupational psychologist of Service:  SNF (31) Provider:  Cindi Carbon, ANP [facility] [phone]  Gayland Curry, DO  Patient Care Team: Gayland Curry, DO as PCP - General (Geriatric Medicine) Terrance Mass, MD (Inactive) as Consulting Physician (Gynecology) Shon Hough, MD as Consulting Physician (Ophthalmology) Carolan Clines, MD (Inactive) as Consulting Physician (Urology) Gatha Mayer, MD as Consulting Physician (Gastroenterology) Wyatt Portela, MD as Consulting Physician (Oncology)  Extended Emergency Contact Information Primary Emergency Contact: Fox,Richard (Dr) Address: [address]          [place], [place] of [place] Phone: [phone] Work Phone: [phone] Relation: Alanson Puls Secondary Emergency Contact: Claudell Kyle Address: [place] [address] Johnnette Litter of [place] Mobile Phone: [phone] Relation: Son  Code Status:  DNR Goals of care: Advanced Directive information Advanced Directives 12/23/2019  Does Patient Have a Medical Advance Directive? Yes  Type of Paramedic of [place];Out of facility DNR (pink MOST or yellow form)  Does patient want to make changes to medical advance directive? No - Patient declined  Copy of [place] in Chart? Yes - validated most recent copy scanned in chart (See row information)  Pre-existing out of facility DNR order (yellow form or pink MOST form) Pink MOST/Yellow Form most recent copy in chart - Physician notified to receive inpatient order     Chief Complaint  Patient presents with  . Medical Management of Chronic Issues    HPI:  Pt is a 86 y.o. female seen today for medical management of chronic diseases.    Ms. Wileman is sitting in her chair and we communicated with a white board for our visit due to her severe hearing loss.   When asked how she is  doing she replied "I am old"  She denies any pain to her back or feet but thinks " I should have pain due to my age"  The nurse is doing a catheterization during the day and then she has a foley place each night. This regimen seems to be working well with her. She is no longer yelling out at night and denies any bladder pain or burning. The nurse puts zeasorb AF on her groin area daily to prevent yeast which is also helpful.   Past Medical History:  Diagnosis Date  . Anemia, unspecified   . Asymptomatic varicose veins   . Carotid bruit    hx of  . Carotid bruit    doppler normal in the past  . Diverticulosis   . Dyslipidemia   . Esophageal reflux   . Female bladder prolapse, acquired 07/17/2010  . Fibroid   . Full incontinence of feces   . Hemorrhoid    1963 surgical excision  . History of colon cancer    Adenocarcinoma of right colon, stage T2, N1.s/p resection/chemotherapy 2002  . HTN (hypertension)   . Hx of colonic polyps    1965 surgical excision  . Hypothyroidism   . Idiopathic osteoporosis   . Idiopathic osteoporosis   . Internal hemorrhoids without mention of complication    bleed easily  . Irritable bowel syndrome   . Melanoma of skin, site unspecified   . MVP (mitral valve prolapse)   . OA (osteoarthritis)    left hip  . Osteoarthrosis, unspecified whether generalized or localized, pelvic region and thigh   . Peripheral neuropathy  hands/ feet  . 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 Spinal stenosis, unspecified region other than cervical   . Vaginal bleeding, abnormal   . Vaginitis, atrophic   . Varicose veins    Past Surgical History:  Procedure Laterality Date  . COLONOSCOPY W/ POLYPECTOMY  2006   3 mm polyp destroyed, diverticulosis  . DILATION AND CURETTAGE OF UTERUS    . ESOPHAGOGASTRODUODENOSCOPY  2009   mild gastritis  . HEMICOLECTOMY  2002   right  . [place]  . HYSTEROSCOPY    . MELANOMA EXCISION      Allergies  Allergen  Reactions  . Amoxil [Amoxicillin] Diarrhea    Has patient had a PCN reaction causing immediate rash, facial/tongue/throat swelling, SOB or lightheadedness with hypotension: no Has patient had a PCN reaction causing severe rash involving mucus membranes or skin necrosis: no Has patient had a PCN reaction that required hospitalization : no Has patient had a PCN reaction occurring within the last 10 years: no If all of the above answers are "NO", then may proceed with Cephalosporin use.   Otho Darner Allergy]     sensitivity  . Demerol     unknown  . Meperidine Hcl Other (See Comments)    Drop in blood pressure  . Infed [Iron Dextran] Rash    Became very red over face, scalp, and arms  . Neosporin [Neomycin-Bacitracin Zn-Polymyx] Rash    unknown    Outpatient Encounter Medications as of 05/05/2020  Medication Sig  . acetaminophen (TYLENOL) 650 MG CR tablet Take 650 mg by mouth daily.   Marland Kitchen antiseptic oral rinse (BIOTENE) LIQD 15 mLs by Mouth Rinse route 3 (three) times daily.  . Cranberry (ELLURA PO) Take 36 mg by mouth daily.  . Eyelid Cleansers (OCUSOFT EYELID CLEANSING) PADS Apply topically as needed.  . gabapentin (NEURONTIN) 100 MG capsule Take 100 mg by mouth at bedtime.  . hydrocortisone (ANUSOL-HC) 2.5 % rectal cream Place 1 application rectally daily as needed for hemorrhoids or itching.   Marland Kitchen ketoconazole (NIZORAL) 2 % cream Apply 1 application topically 2 (two) times daily as needed for irritation. Apply b/l  to ears and surrounding skin ongoing for dermatitis  . levothyroxine (SYNTHROID, LEVOTHROID) 75 MCG tablet TAKE 1 TABLET DAILY BEFORE BREAKFAST FOR THYROID.  Marland Kitchen lidocaine (XYLOCAINE) 2 % jelly 1 application 3 (three) times daily as needed.  . Melatonin 5 MG TABS Take by mouth at bedtime as needed.   . methenamine (HIPREX) 1 g tablet Take 1 g by mouth at bedtime.  . miconazole (ZEASORB-AF) 2 % powder Apply topically daily. Groin area  . nystatin (NYSTATIN) powder Apply  topically 2 (two) times daily. Apply to inguinal rash  . phenazopyridine (PYRIDIUM) 200 MG tablet Take 200 mg by mouth 3 (three) times daily as needed for pain.  Vladimir Faster Glycol-Propyl Glycol (SYSTANE) 0.4-0.3 % SOLN Place 2 drops into the right eye 2 (two) times daily.  . polyethylene glycol (MIRALAX / GLYCOLAX) 17 g packet Take 17 g by mouth daily.  . Probiotic Product (ALIGN) 4 MG CAPS Take 1 tablet by mouth daily.    . [DISCONTINUED] Cranberry 400 MG CAPS Take 1 capsule by mouth 2 (two) times daily.   No facility-administered encounter medications on file as of 05/05/2020.    Review of Systems  Constitutional: Negative for activity change, appetite change, chills, diaphoresis, fatigue, fever and unexpected weight change.  HENT: Positive for hearing loss. Negative for congestion.  Dry mouth  Respiratory: Negative for cough, shortness of breath and wheezing.   Cardiovascular: Negative for chest pain, palpitations and leg swelling.  Gastrointestinal: Negative for abdominal distention, abdominal pain, constipation and diarrhea.  Genitourinary: Negative for difficulty urinating, dysuria, flank pain, frequency, hematuria, menstrual problem, pelvic pain and urgency.  Musculoskeletal: Positive for gait problem. Negative for arthralgias, back pain, joint swelling and myalgias.  Skin: Negative for wound.  Neurological: Negative for dizziness, tremors, seizures, syncope, facial asymmetry, speech difficulty, weakness, light-headedness, numbness (left foot drop) and headaches.  Psychiatric/Behavioral: Positive for confusion. Negative for agitation and behavioral problems.    Immunization History  Administered Date(s) Administered  . H1N1 05/30/2008  . Influenza Whole 03/17/2000, 03/05/2010, 03/17/2012  . Influenza,inj,Quad PF,6+ Mos 03/14/2015, 04/07/2018, 04/15/2019  . Influenza-Unspecified 04/16/2013, 03/31/2014, 04/11/2016, 04/10/2017, 04/11/2020  . Moderna SARS-COVID-2 Vaccination  06/28/2019, 07/27/2019  . Pneumococcal Conjugate-13 09/08/2015  . Pneumococcal Polysaccharide-23 03/17/1997  . Tdap 10/09/2011  . Zoster Recombinat (Shingrix) 07/13/2017   Pertinent  Health Maintenance Due  Topic Date Due  . INFLUENZA VACCINE  Completed  . DEXA SCAN  Completed  . PNA vac Low Risk Adult  Completed   Fall Risk  07/28/2019 02/12/2019 04/08/2018 01/06/2018 11/26/2017  Falls in the past year? - 0 No Yes No  Number falls in past yr: 0 0 - 1 -  Injury with Fall? 0 0 - No -  Risk for fall due to : - Impaired balance/gait - - -  Follow up - Falls evaluation completed - - -   Functional Status Survey:    Vitals:   05/05/20 0919  Weight: 104 lb (47.2 kg)   Body mass index is 21.74 kg/m.  Wt Readings from Last 3 Encounters:  05/05/20 104 lb (47.2 kg)  03/30/20 105 lb 4.8 oz (47.8 kg)  12/23/19 107 lb 3.2 oz (48.6 kg)    Physical Exam Vitals and nursing note reviewed.  Constitutional:      General: She is not in acute distress.    Appearance: She is not diaphoretic.  HENT:     Head: Normocephalic and atraumatic.     Mouth/Throat:     Mouth: Mucous membranes are dry.  Neck:     Vascular: No JVD.  Cardiovascular:     Rate and Rhythm: Normal rate and regular rhythm.     Heart sounds: No murmur heard.   Pulmonary:     Effort: Pulmonary effort is normal. No respiratory distress.     Breath sounds: Rhonchi (scattered) present. No wheezing.  Abdominal:     General: Bowel sounds are normal. There is no distension.     Palpations: Abdomen is soft.  Musculoskeletal:     Right lower leg: No edema.     Left lower leg: No edema.  Skin:    General: Skin is warm and dry.  Neurological:     Mental Status: She is alert.     Comments: Oriented x 2  Psychiatric:        Mood and Affect: Mood normal.     Labs reviewed: Recent Labs    01/16/20 0000  NA 137  K 4.3  CL 98*  CO2 25*  BUN 17  CREATININE 0.6  CALCIUM 9.2   No results for input(s): AST, ALT,  ALKPHOS, BILITOT, PROT, ALBUMIN in the last 8760 hours. No results for input(s): WBC, NEUTROABS, HGB, HCT, MCV, PLT in the last 8760 hours. Lab Results  Component Value Date   TSH 0.48 11/08/2019   Lab  Results  Component Value Date   HGBA1C 5.9 12/02/2006   Lab Results  Component Value Date   CHOL 174 11/07/2015   HDL 79 (A) 11/07/2015   LDLCALC 78 11/07/2015   LDLDIRECT 123.7 12/08/2007   TRIG 87 11/07/2015   CHOLHDL 3 07/16/2011    Significant Diagnostic Results in last 30 days:  No results found.  Assessment/Plan  1. Hereditary and idiopathic peripheral neuropathy Denies pain  Continue Neurontin 100 mg qhs   2. Dementia without behavioral disturbance, unspecified dementia type ([place]) Difficult to assess due to hearing and cognitive losses. Remains functional and able to communciate.   3. Urinary retention Doing well with foley at night and one catheterization during the day  4. Chronic constipation with overflow incontinence Denies constipation Continue Miralax 17 grams qd   5. Dry mouth Continue Biotene 15 ml tid   6. Primary hypertension Controlled without medications    Family/ staff Communication: resident  Labs/tests ordered:  NA

## 2020-05-23 ENCOUNTER — Non-Acute Institutional Stay (SKILLED_NURSING_FACILITY): Payer: Medicare Other | Admitting: Internal Medicine

## 2020-05-23 ENCOUNTER — Encounter: Payer: Self-pay | Admitting: Internal Medicine

## 2020-05-23 DIAGNOSIS — L219 Seborrheic dermatitis, unspecified: Secondary | ICD-10-CM

## 2020-05-23 DIAGNOSIS — G609 Hereditary and idiopathic neuropathy, unspecified: Secondary | ICD-10-CM

## 2020-05-23 DIAGNOSIS — H903 Sensorineural hearing loss, bilateral: Secondary | ICD-10-CM | POA: Diagnosis not present

## 2020-05-23 NOTE — Progress Notes (Signed)
Patient ID: [name] Small, female   DOB: [birth year], 86 y.o.   MRN: [id number]  Location:  [facility] Room Number: 150 Place of Service:  SNF (31) Provider:  Bob Daversa L. Renato Gails, D.O., C.M.D.  Kermit Balo, DO  Patient Care Team: Kermit Balo, DO as PCP - General (Geriatric Medicine) Ok Edwards, MD (Inactive) as Consulting Physician (Gynecology) Mckinley Jewel, MD as Consulting Physician (Ophthalmology) Jethro Bolus, MD (Inactive) as Consulting Physician (Urology) Iva Boop, MD as Consulting Physician (Gastroenterology) Benjiman Core, MD as Consulting Physician (Oncology)  Extended Emergency Contact Information Primary Emergency Contact: Fox,Richard (Dr) Address: [address]          [place], [place] of [place] Home Phone: [phone] Work Phone: [phone] Relation: Aurther Loft Secondary Emergency Contact: Antonieta Iba Address: [address]          [place], [place] [address] Darden Amber of [place] Mobile Phone: [phone] Relation: Son  Code Status:  DNR Goals of care: Advanced Directive information Advanced Directives 05/23/2020  Does Patient Have a Medical Advance Directive? Yes  Type of Advance Directive Out of facility DNR (pink MOST or yellow form)  Does patient want to make changes to medical advance directive? No - Patient declined  Copy of Healthcare Power of Attorney in Chart? -  Pre-existing out of facility DNR order (yellow form or pink MOST form) Pink MOST/Yellow Form most recent copy in chart - Physician notified to receive inpatient order     Chief Complaint  Patient presents with  . Acute Visit    Red, swollen ears and pain at the top of her left toes     HPI:  Pt is a 86 y.o. female seen today for an acute visit for red swollen ears and pain at the top of her left toes.  I received two different SBARs with these concerns.  [name] Small is 86 yo with chronic urinary  retention, incontinence, constipation, prior kidney cancer, poor vision and hearing due to advanced age.  I used the dry erase board to communicate with her today.    She does not c/o her ears.  They've been red and somewhat swollen for a couple years now and she's been on ketoconazole cream, but this recently has not been effective though nursing reports regular use.  She has had some cracking of the skin and drainage especially from the right ear (clear, no odor).  They also used some A+D ointment on them apparently beginning 12/3.    She c/o pain on the top of her left foot near her toes.  No redness or skin breakdown.  This Is the foot with the foot drop for which she wears an AFO.  She reports sharp, burning pain.  Ice has been tried for comfort with some improvement.  It comes and goes per patient and does not actively hurt at the time of her visit.  She is on just 100mg  of gabapentin at hs.  She wants relief.    Past Medical History:  Diagnosis Date  . Anemia, unspecified   . Asymptomatic varicose veins   . Carotid bruit    hx of  . Carotid bruit    doppler normal in the past  . Diverticulosis   . Dyslipidemia   . Esophageal reflux   . Female bladder prolapse, acquired 07/17/2010  . Fibroid   . Full incontinence of feces   . Hemorrhoid    1963 surgical excision  . History  of colon cancer    Adenocarcinoma of right colon, stage T2, N1.s/p resection/chemotherapy 2002  . HTN (hypertension)   . Hx of colonic polyps    1965 surgical excision  . Hypothyroidism   . Idiopathic osteoporosis   . Idiopathic osteoporosis   . Internal hemorrhoids without mention of complication    bleed easily  . Irritable bowel syndrome   . Melanoma of skin, site unspecified   . MVP (mitral valve prolapse)   . OA (osteoarthritis)    left hip  . Osteoarthrosis, unspecified whether generalized or localized, pelvic region and thigh   . Peripheral neuropathy    hands/ feet  . Prolapsed urethral  mucosa(599.5)   . Spinal stenosis, unspecified region other than cervical   . Vaginal bleeding, abnormal   . Vaginitis, atrophic   . Varicose veins    Past Surgical History:  Procedure Laterality Date  . COLONOSCOPY W/ POLYPECTOMY  2006   3 mm polyp destroyed, diverticulosis  . DILATION AND CURETTAGE OF UTERUS    . ESOPHAGOGASTRODUODENOSCOPY  2009   mild gastritis  . HEMICOLECTOMY  2002   right  . HEMORRHOID SURGERY  1965  . HYSTEROSCOPY    . MELANOMA EXCISION      Allergies  Allergen Reactions  . Amoxil [Amoxicillin] Diarrhea    Has patient had a PCN reaction causing immediate rash, facial/tongue/throat swelling, SOB or lightheadedness with hypotension: no Has patient had a PCN reaction causing severe rash involving mucus membranes or skin necrosis: no Has patient had a PCN reaction that required hospitalization : no Has patient had a PCN reaction occurring within the last 10 years: no If all of the above answers are "NO", then may proceed with Cephalosporin use.   Parke Simmers Allergy]     sensitivity  . Demerol     unknown  . Meperidine Hcl Other (See Comments)    Drop in blood pressure  . Infed [Iron Dextran] Rash    Became very red over face, scalp, and arms  . Neosporin [Neomycin-Bacitracin Zn-Polymyx] Rash    unknown    Outpatient Encounter Medications as of 05/23/2020  Medication Sig  . acetaminophen (TYLENOL) 650 MG CR tablet Take 650 mg by mouth daily.   Marland Kitchen antiseptic oral rinse (BIOTENE) LIQD 15 mLs by Mouth Rinse route 3 (three) times daily.  . Cranberry (ELLURA PO) Take 36 mg by mouth daily.  . Eyelid Cleansers (OCUSOFT EYELID CLEANSING) PADS Apply topically as needed.  . gabapentin (NEURONTIN) 100 MG capsule Take 100 mg by mouth at bedtime.  . hydrocortisone (ANUSOL-HC) 2.5 % rectal cream Place 1 application rectally daily as needed for hemorrhoids or itching.   Marland Kitchen ketoconazole (NIZORAL) 2 % cream Apply 1 application topically 2 (two) times daily as  needed for irritation. Apply b/l  to ears and surrounding skin ongoing for dermatitis  . levothyroxine (SYNTHROID, LEVOTHROID) 75 MCG tablet TAKE 1 TABLET DAILY BEFORE BREAKFAST FOR THYROID.  Marland Kitchen lidocaine (XYLOCAINE) 2 % jelly 1 application 3 (three) times daily as needed.  . Melatonin 5 MG TABS Take by mouth at bedtime as needed.   . methenamine (HIPREX) 1 g tablet Take 1 g by mouth at bedtime.  . miconazole (ZEASORB-AF) 2 % powder Apply topically daily. Groin area  . nystatin (NYSTATIN) powder Apply topically 2 (two) times daily. Apply to inguinal rash  . phenazopyridine (PYRIDIUM) 200 MG tablet Take 200 mg by mouth 3 (three) times daily as needed for pain.  Bertram Gala Glycol-Propyl Glycol (SYSTANE)  0.4-0.3 % SOLN Place 2 drops into the right eye 2 (two) times daily.  . polyethylene glycol (MIRALAX / GLYCOLAX) 17 g packet Take 17 g by mouth daily.  . Probiotic Product (ALIGN) 4 MG CAPS Take 1 tablet by mouth daily.    . sodium fluoride (PREVIDENT 5000 PLUS) 1.1 % CREA dental cream Place 1 application onto teeth every evening.   No facility-administered encounter medications on file as of 05/23/2020.    Review of Systems  Constitutional: Positive for malaise/fatigue. Negative for chills and fever.  HENT: Positive for hearing loss. Negative for congestion and sore throat.   Eyes: Positive for blurred vision.  Respiratory: Negative for cough and shortness of breath.   Cardiovascular: Negative for chest pain, palpitations and leg swelling.  Gastrointestinal: Positive for constipation. Negative for blood in stool and melena.       Hemorrhoids  Genitourinary: Negative for dysuria.       I/O caths, urinary retention, incontinence  Musculoskeletal: Negative for falls and joint pain.  Skin: Positive for rash. Negative for itching.  Neurological: Positive for tingling and sensory change. Negative for loss of consciousness.  Endo/Heme/Allergies: Bruises/bleeds easily.  Psychiatric/Behavioral:  Positive for memory loss. Negative for depression. The patient is nervous/anxious. The patient does not have insomnia.        "I'm just old"    Immunization History  Administered Date(s) Administered  . H1N1 05/30/2008  . Influenza Whole 03/17/2000, 03/05/2010, 03/17/2012  . Influenza,inj,Quad PF,6+ Mos 03/14/2015, 04/07/2018, 04/15/2019  . Influenza-Unspecified 04/16/2013, 03/31/2014, 04/11/2016, 04/10/2017, 04/11/2020  . Moderna Sars-Covid-2 Vaccination 06/28/2019, 07/27/2019  . Pneumococcal Conjugate-13 09/08/2015  . Pneumococcal Polysaccharide-23 03/17/1997  . Tdap 10/09/2011  . Zoster Recombinat (Shingrix) 07/13/2017   Pertinent  Health Maintenance Due  Topic Date Due  . INFLUENZA VACCINE  Completed  . DEXA SCAN  Completed  . PNA vac Low Risk Adult  Completed   Fall Risk  07/28/2019 02/12/2019 04/08/2018 01/06/2018 11/26/2017  Falls in the past year? - 0 No Yes No  Number falls in past yr: 0 0 - 1 -  Injury with Fall? 0 0 - No -  Risk for fall due to : - Impaired balance/gait - - -  Follow up - Falls evaluation completed - - -   Functional Status Survey:    Vitals:   05/23/20 1616  BP: 121/62  Pulse: 64  Temp: 97.9 F (36.6 C)  SpO2: 91%  Weight: 105 lb 12.8 oz (48 kg)  Height: 4\' 10"  (1.473 m)   Body mass index is 22.11 kg/m. Physical Exam Vitals reviewed.  Constitutional:      General: She is not in acute distress.    Appearance: Normal appearance. She is not toxic-appearing.  HENT:     Head: Normocephalic and atraumatic.     Ears:     Comments: Erythema of both ears, some serous type drainage from right ear, no tenderness or evidence of excoriation Eyes:     Comments: Glasses, some lid drooping  Cardiovascular:     Rate and Rhythm: Normal rate and regular rhythm.     Pulses: Normal pulses.     Heart sounds: Normal heart sounds.  Pulmonary:     Effort: Pulmonary effort is normal.     Breath sounds: Normal breath sounds. No wheezing, rhonchi or rales.   Abdominal:     General: Bowel sounds are normal.  Musculoskeletal:     Cervical back: Neck supple.     Right lower leg: No edema.  Left lower leg: No edema.     Comments: Left foot drop, using AFO  Neurological:     Mental Status: She is alert. Mental status is at baseline.     Sensory: Sensory deficit present.     Motor: Weakness present.     Gait: Gait abnormal.     Comments: No tenderness of left foot near toes, no skin changes  Psychiatric:        Mood and Affect: Mood normal.     Labs reviewed: Recent Labs    01/16/20 0000  NA 137  K 4.3  CL 98*  CO2 25*  BUN 17  CREATININE 0.6  CALCIUM 9.2   No results for input(s): AST, ALT, ALKPHOS, BILITOT, PROT, ALBUMIN in the last 8760 hours. No results for input(s): WBC, NEUTROABS, HGB, HCT, MCV, PLT in the last 8760 hours. Lab Results  Component Value Date   TSH 0.48 11/08/2019   Lab Results  Component Value Date   HGBA1C 5.9 12/02/2006   Lab Results  Component Value Date   CHOL 174 11/07/2015   HDL 79 (A) 11/07/2015   LDLCALC 78 11/07/2015   LDLDIRECT 123.7 12/08/2007   TRIG 87 11/07/2015   CHOLHDL 3 07/16/2011    Significant Diagnostic Results in last 30 days:  No results found.  Assessment/Plan 1. Seborrheic dermatitis -not responding to ketoconazole -try clobetasol at this point and see if that helps   2. Hereditary and idiopathic peripheral neuropathy -increase gabapentin to 300mg  at hs from 100mg  and monitor -indirect ice also ok to use since she felt it did help  3. Sensorineural hearing loss (SNHL) of both ears -must communicate with dry erase board especially in view of masking and her poor vision, too  Family/ staff Communication: d/w 1st and 2nd shift nurses on SNF  Labs/tests ordered:  No new  25 mins spent due to communication with nursing and use of communication board with resident  Erbie Arment L. Audray Rumore, D.O. Geriatrics [facility] [address], [place] [address] Cell Phone (Mon-Fri 8am-5pm):  [phone] On Call:  [phone] & follow prompts after 5pm & weekends Office Phone:  [phone] Office Fax:  [phone]

## 2020-06-29 ENCOUNTER — Non-Acute Institutional Stay (SKILLED_NURSING_FACILITY): Payer: Medicare Other | Admitting: Adult Health

## 2020-06-29 ENCOUNTER — Encounter: Payer: Self-pay | Admitting: Adult Health

## 2020-06-29 DIAGNOSIS — L219 Seborrheic dermatitis, unspecified: Secondary | ICD-10-CM

## 2020-06-29 DIAGNOSIS — G609 Hereditary and idiopathic neuropathy, unspecified: Secondary | ICD-10-CM | POA: Diagnosis not present

## 2020-06-29 DIAGNOSIS — L304 Erythema intertrigo: Secondary | ICD-10-CM

## 2020-06-29 DIAGNOSIS — M21372 Foot drop, left foot: Secondary | ICD-10-CM

## 2020-06-29 DIAGNOSIS — R339 Retention of urine, unspecified: Secondary | ICD-10-CM | POA: Diagnosis not present

## 2020-06-29 DIAGNOSIS — N390 Urinary tract infection, site not specified: Secondary | ICD-10-CM

## 2020-06-29 NOTE — Progress Notes (Signed)
Location:  [facility] of Service:  SNF (31) Provider:   Cindi Carbon, ANP [place] [phone]   Gayland Curry, DO  Patient Care Team: Gayland Curry, DO as PCP - General (Geriatric Medicine) Terrance Mass, MD (Inactive) as Consulting Physician (Gynecology) Shon Hough, MD as Consulting Physician (Ophthalmology) Carolan Clines, MD (Inactive) as Consulting Physician (Urology) Gatha Mayer, MD as Consulting Physician (Gastroenterology) Wyatt Portela, MD as Consulting Physician (Oncology)  Extended Emergency Contact Information Primary Emergency Contact: Fox,Richard (Dr) Address: [address]          [place], [place] Phone: [phone] Work Phone: [phone] Relation: Alanson Puls Secondary Emergency Contact: Claudell Kyle Address: [place] [address] Johnnette Litter of [place] Mobile Phone: [phone] Relation: Son  Code Status:  DNR Goals of care: Advanced Directive information Advanced Directives 05/23/2020  Does Patient Have a Medical Advance Directive? Yes  Type of Advance Directive Out of facility DNR (pink MOST or yellow form)  Does patient want to make changes to medical advance directive? No - Patient declined  Copy of [place] in Chart? -  Pre-existing out of facility DNR order (yellow form or pink MOST form) Pink MOST/Yellow Form most recent copy in chart - Physician notified to receive inpatient order     Chief Complaint  Patient presents with  . Medical Management of Chronic Issues    HPI:  Pt is a 87 y.o. female seen today for medical management of chronic diseases.   She reports her ears are still hurting, draining, and red. She was started on clotrimazole for her ears due to seborrheic dermatitis on 12/7.  She is not having purulent drainage or fever.  She has a hx of urinary retention and continues with  catheterization I/O during the day and foley at night. She denies any bladder pain, vaginal discomfort etc.  HOH and wears hearing aides. Uses the white board to communicate.  Neurontin was increased last month due to foot pain and burning. This has helped.   Past Medical History:  Diagnosis Date  . Anemia, unspecified   . Asymptomatic varicose veins   . Carotid bruit    hx of  . Carotid bruit    doppler normal in the past  . Diverticulosis   . Dyslipidemia   . Esophageal reflux   . Female bladder prolapse, acquired 07/17/2010  . Fibroid   . Full incontinence of feces   . Hemorrhoid    1963 surgical excision  . History of colon cancer    Adenocarcinoma of right colon, stage T2, N1.s/p resection/chemotherapy 2002  . HTN (hypertension)   . Hx of colonic polyps    1965 surgical excision  . Hypothyroidism   . Idiopathic osteoporosis   . Idiopathic osteoporosis   . Internal hemorrhoids without mention of complication    bleed easily  . Irritable bowel syndrome   . Melanoma of skin, site unspecified   . MVP (mitral valve prolapse)   . OA (osteoarthritis)    left hip  . Osteoarthrosis, unspecified whether generalized or localized, pelvic region and thigh   . Peripheral neuropathy    hands/ feet  . 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 Spinal stenosis, unspecified region other than cervical   . Vaginal bleeding, abnormal   . Vaginitis, atrophic   . Varicose veins    Past Surgical History:  Procedure Laterality Date  . COLONOSCOPY W/ POLYPECTOMY  2006   3 mm polyp destroyed, diverticulosis  . DILATION AND CURETTAGE OF UTERUS    . ESOPHAGOGASTRODUODENOSCOPY  2009   mild gastritis  . HEMICOLECTOMY  2002   right  . [place]  . HYSTEROSCOPY    . MELANOMA EXCISION      Allergies  Allergen Reactions  . Amoxil [Amoxicillin] Diarrhea    Has patient had a PCN reaction causing immediate rash, facial/tongue/throat swelling, SOB or lightheadedness with  hypotension: no Has patient had a PCN reaction causing severe rash involving mucus membranes or skin necrosis: no Has patient had a PCN reaction that required hospitalization : no Has patient had a PCN reaction occurring within the last 10 years: no If all of the above answers are "NO", then may proceed with Cephalosporin use.   Otho Darner Allergy]     sensitivity  . Demerol     unknown  . Meperidine Hcl Other (See Comments)    Drop in blood pressure  . Infed [Iron Dextran] Rash    Became very red over face, scalp, and arms  . Neosporin [Neomycin-Bacitracin Zn-Polymyx] Rash    unknown    Outpatient Encounter Medications as of 06/29/2020  Medication Sig  . clotrimazole (LOTRIMIN) 1 % cream Apply 1 application topically 2 (two) times daily. Both ears  . acetaminophen (TYLENOL) 650 MG CR tablet Take 1,300 mg by mouth daily.  Marland Kitchen antiseptic oral rinse (BIOTENE) LIQD 15 mLs by Mouth Rinse route 3 (three) times daily.  . Cranberry (ELLURA PO) Take 36 mg by mouth daily.  . Eyelid Cleansers (OCUSOFT EYELID CLEANSING) PADS Apply topically as needed.  . gabapentin (NEURONTIN) 100 MG capsule Take 300 mg by mouth at bedtime.  . hydrocortisone (ANUSOL-HC) 2.5 % rectal cream Place 1 application rectally daily as needed for hemorrhoids or itching.   . levothyroxine (SYNTHROID, LEVOTHROID) 75 MCG tablet TAKE 1 TABLET DAILY BEFORE BREAKFAST FOR THYROID.  Marland Kitchen lidocaine (XYLOCAINE) 2 % jelly 1 application 3 (three) times daily as needed.  . Melatonin 5 MG TABS Take by mouth at bedtime as needed.   . methenamine (HIPREX) 1 g tablet Take 1 g by mouth at bedtime.  . miconazole (ZEASORB-AF) 2 % powder Apply topically daily. Groin area  . nystatin (NYSTATIN) powder Apply topically 2 (two) times daily. Apply to inguinal rash  . phenazopyridine (PYRIDIUM) 200 MG tablet Take 200 mg by mouth 3 (three) times daily as needed for pain.  Vladimir Faster Glycol-Propyl Glycol (SYSTANE) 0.4-0.3 % SOLN Place 2 drops  into the right eye 2 (two) times daily.  . polyethylene glycol (MIRALAX / GLYCOLAX) 17 g packet Take 17 g by mouth daily.  . Probiotic Product (ALIGN) 4 MG CAPS Take 1 tablet by mouth daily.    . sodium fluoride (PREVIDENT 5000 PLUS) 1.1 % CREA dental cream Place 1 application onto teeth every evening.  . [DISCONTINUED] ketoconazole (NIZORAL) 2 % cream Apply 1 application topically 2 (two) times daily as needed for irritation. Apply b/l  to ears and surrounding skin ongoing for dermatitis   No facility-administered encounter medications on file as of 06/29/2020.    Review of Systems  Constitutional: Negative for activity change, appetite change, chills, diaphoresis, fatigue, fever and unexpected weight change.  HENT: Positive for hearing loss. Negative for congestion.   Respiratory: Negative for cough, shortness of breath and wheezing.   Cardiovascular: Positive for leg swelling. Negative for chest pain and palpitations.  Gastrointestinal: Negative for abdominal distention, abdominal pain, constipation and diarrhea.  Genitourinary: Negative for difficulty urinating and dysuria.  Musculoskeletal: Positive for gait problem. Negative for arthralgias, back pain, joint swelling and myalgias.  Skin: Positive for color change and rash.  Neurological: Positive for numbness. Negative for dizziness, tremors, seizures, syncope, facial asymmetry, speech difficulty, weakness, light-headedness and headaches.  Psychiatric/Behavioral: Positive for confusion. Negative for agitation and behavioral problems.    Immunization History  Administered Date(s) Administered  . H1N1 05/30/2008  . Influenza Whole 03/17/2000, 03/05/2010, 03/17/2012  . Influenza, High Dose Seasonal PF 04/07/2020  . Influenza,inj,Quad PF,6+ Mos 03/14/2015, 04/07/2018, 04/15/2019  . Influenza-Unspecified 04/16/2013, 03/31/2014, 04/11/2016, 04/10/2017  . Moderna Sars-Covid-2 Vaccination 06/28/2019, 07/27/2019  . Pneumococcal Conjugate-13  09/08/2015  . Pneumococcal Polysaccharide-23 03/17/1997  . Tdap 10/09/2011  . Zoster Recombinat (Shingrix) 07/13/2017   Pertinent  Health Maintenance Due  Topic Date Due  . INFLUENZA VACCINE  Completed  . DEXA SCAN  Completed  . PNA vac Low Risk Adult  Completed   Fall Risk  07/28/2019 02/12/2019 04/08/2018 01/06/2018 11/26/2017  Falls in the past year? - 0 No Yes No  Number falls in past yr: 0 0 - 1 -  Injury with Fall? 0 0 - No -  Risk for fall due to : - Impaired balance/gait - - -  Follow up - Falls evaluation completed - - -   Functional Status Survey:    Vitals:   06/29/20 1630  Weight: 105 lb 6.4 oz (47.8 kg)   Body mass index is 22.03 kg/m. Physical Exam Vitals and nursing note reviewed.  Constitutional:      General: She is not in acute distress.    Appearance: She is not diaphoretic.  HENT:     Head: Normocephalic and atraumatic.     Right Ear: Tympanic membrane normal.     Left Ear: Tympanic membrane normal.     Ears:     Comments: Erythema and clear drainage noted to both external ear canals and auricular areas.     Mouth/Throat:     Mouth: Mucous membranes are dry.     Pharynx: Oropharynx is clear.  Neck:     Vascular: No JVD.  Cardiovascular:     Rate and Rhythm: Normal rate and regular rhythm.     Heart sounds: No murmur heard.   Pulmonary:     Effort: Pulmonary effort is normal. No respiratory distress.     Breath sounds: Rhonchi present. No wheezing.  Abdominal:     General: Bowel sounds are normal. There is no distension.     Palpations: Abdomen is soft.     Tenderness: There is no abdominal tenderness.  Skin:    General: Skin is warm and dry.     Findings: Rash present.  Neurological:     General: No focal deficit present.     Mental Status: She is alert. Mental status is at baseline.  Psychiatric:        Mood and Affect: Mood normal.     Labs reviewed: Recent Labs    01/16/20 0000  NA 137  K 4.3  CL 98*  CO2 25*  BUN 17   CREATININE 0.6  CALCIUM 9.2   No results for input(s): AST, ALT, ALKPHOS, BILITOT, PROT, ALBUMIN in the last 8760 hours. No results for input(s): WBC, NEUTROABS, HGB, HCT, MCV, PLT in the last 8760 hours. Lab Results  Component Value Date   TSH 0.48 11/08/2019   Lab Results  Component  Value Date   HGBA1C 5.9 12/02/2006   Lab Results  Component Value Date   CHOL 174 11/07/2015   HDL 79 (A) 11/07/2015   LDLCALC 78 11/07/2015   LDLDIRECT 123.7 12/08/2007   TRIG 87 11/07/2015   CHOLHDL 3 07/16/2011    Significant Diagnostic Results in last 30 days:  No results found.  Assessment/Plan 1. Seborrheic dermatitis Diflucan 100 mg x 1 then repeat in 72 hrs Clean ears with warm soapy water and then dry thoroughly bid Keep hearing aides clean Apply clobetasol 0.05% ointment bid x 2 weeks Report to psc if no improvement   2. Neuropathy Pain improved with increased neurontin Continue 300 mg qhs   3. Left foot drop Wears brace to left foot and a walker for ambulation   4. Urinary retention I/O cath during the day and foley at night.   5. Recurrent UTI No current symptoms. Continues on Hiprex daily   6. Intertrigo Recurrent issue, uses nystatin powder and zasorb   Family/ staff Communication: nurse and resident   Labs/tests ordered:  NA

## 2020-08-07 ENCOUNTER — Encounter: Payer: Self-pay | Admitting: Internal Medicine

## 2020-08-10 ENCOUNTER — Encounter: Payer: Self-pay | Admitting: Adult Health

## 2020-08-10 ENCOUNTER — Non-Acute Institutional Stay (SKILLED_NURSING_FACILITY): Payer: Medicare Other | Admitting: Adult Health

## 2020-08-10 DIAGNOSIS — R339 Retention of urine, unspecified: Secondary | ICD-10-CM

## 2020-08-10 DIAGNOSIS — N952 Postmenopausal atrophic vaginitis: Secondary | ICD-10-CM | POA: Diagnosis not present

## 2020-08-10 DIAGNOSIS — G609 Hereditary and idiopathic neuropathy, unspecified: Secondary | ICD-10-CM

## 2020-08-10 DIAGNOSIS — K582 Mixed irritable bowel syndrome: Secondary | ICD-10-CM

## 2020-08-10 DIAGNOSIS — H903 Sensorineural hearing loss, bilateral: Secondary | ICD-10-CM

## 2020-08-10 DIAGNOSIS — L219 Seborrheic dermatitis, unspecified: Secondary | ICD-10-CM

## 2020-08-10 NOTE — Progress Notes (Signed)
Location:  [facility] of Service:  SNF (31) Provider:   Cindi Carbon, ANP [place] [phone]   Gayland Curry, DO  Patient Care Team: Gayland Curry, DO as PCP - General (Geriatric Medicine) Terrance Mass, MD (Inactive) as Consulting Physician (Gynecology) Shon Hough, MD as Consulting Physician (Ophthalmology) Carolan Clines, MD (Inactive) as Consulting Physician (Urology) Gatha Mayer, MD as Consulting Physician (Gastroenterology) Wyatt Portela, MD as Consulting Physician (Oncology)  Extended Emergency Contact Information Primary Emergency Contact: Fox,Richard (Dr) Address: [address]          [place], [place] Phone: [phone] Work Phone: [phone] Relation: Alanson Puls Secondary Emergency Contact: Claudell Kyle Address: [place] [address] Johnnette Litter of [place] Mobile Phone: [phone] Relation: Son  Code Status:  DNR Goals of care: Advanced Directive information Advanced Directives 05/23/2020  Does Patient Have a Medical Advance Directive? Yes  Type of Advance Directive Out of facility DNR (pink MOST or yellow form)  Does patient want to make changes to medical advance directive? No - Patient declined  Copy of [place] in Chart? -  Pre-existing out of facility DNR order (yellow form or pink MOST form) Pink MOST/Yellow Form most recent copy in chart - Physician notified to receive inpatient order     Chief Complaint  Patient presents with  . Medical Management of Chronic Issues    HPI:  Pt is a 87 y.o. female seen today for medical management of chronic diseases.   Ms. Chauncey Cruel was seen by dermatology for seborrheic dermatitis one week after I saw her and prescribed clobetasol ointment. They prescribed clobetasol foam which seems to have helped. She has not current symptoms. She has a lesion on her scalp that is  likely a neoplasm but has chosen not to have it treated due to her goals of care.   She denies any issues with foot pain or burning. Has neuropathy and foot drop and wears a brace on the left foot  Continues to ambulate in her room with a walker  Has low vision and difficulty hearing. Has hearing aides and uses a board to communicate.   No urinary symptoms reported.   Has periods of constipation and diarrhea due to IBS but none recent.  Past Medical History:  Diagnosis Date  . Anemia, unspecified   . Asymptomatic varicose veins   . Carotid bruit    hx of  . Carotid bruit    doppler normal in the past  . Diverticulosis   . Dyslipidemia   . Esophageal reflux   . Female bladder prolapse, acquired 07/17/2010  . Fibroid   . Full incontinence of feces   . Hemorrhoid    1963 surgical excision  . History of colon cancer    Adenocarcinoma of right colon, stage T2, N1.s/p resection/chemotherapy 2002  . HTN (hypertension)   . Hx of colonic polyps    1965 surgical excision  . Hypothyroidism   . Idiopathic osteoporosis   . Idiopathic osteoporosis   . Internal hemorrhoids without mention of complication    bleed easily  . Irritable bowel syndrome   . Melanoma of skin, site unspecified   . MVP (mitral valve prolapse)   . OA (osteoarthritis)    left hip  . Osteoarthrosis, unspecified whether generalized or localized, pelvic region and thigh   . Peripheral neuropathy  hands/ feet  . 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 Spinal stenosis, unspecified region other than cervical   . Vaginal bleeding, abnormal   . Vaginitis, atrophic   . Varicose veins    Past Surgical History:  Procedure Laterality Date  . COLONOSCOPY W/ POLYPECTOMY  2006   3 mm polyp destroyed, diverticulosis  . DILATION AND CURETTAGE OF UTERUS    . ESOPHAGOGASTRODUODENOSCOPY  2009   mild gastritis  . HEMICOLECTOMY  2002   right  . [place]  . HYSTEROSCOPY    . MELANOMA EXCISION       Allergies  Allergen Reactions  . Amoxil [Amoxicillin] Diarrhea    Has patient had a PCN reaction causing immediate rash, facial/tongue/throat swelling, SOB or lightheadedness with hypotension: no Has patient had a PCN reaction causing severe rash involving mucus membranes or skin necrosis: no Has patient had a PCN reaction that required hospitalization : no Has patient had a PCN reaction occurring within the last 10 years: no If all of the above answers are "NO", then may proceed with Cephalosporin use.   Otho Darner Allergy]     sensitivity  . Demerol     unknown  . Meperidine Hcl Other (See Comments)    Drop in blood pressure  . Infed [Iron Dextran] Rash    Became very red over face, scalp, and arms  . Neosporin [Neomycin-Bacitracin Zn-Polymyx] Rash    unknown    Outpatient Encounter Medications as of 08/10/2020  Medication Sig  . ketoconazole (NIZORAL) 2 % shampoo Apply 1 application topically once a week.  Marland Kitchen acetaminophen (TYLENOL) 650 MG CR tablet Take 1,300 mg by mouth daily.  Marland Kitchen antiseptic oral rinse (BIOTENE) LIQD 15 mLs by Mouth Rinse route 3 (three) times daily.  . Cranberry (ELLURA PO) Take 36 mg by mouth daily.  . Eyelid Cleansers (OCUSOFT EYELID CLEANSING) PADS Apply topically as needed.  . gabapentin (NEURONTIN) 100 MG capsule Take 300 mg by mouth at bedtime.  . hydrocortisone (ANUSOL-HC) 2.5 % rectal cream Place 1 application rectally daily as needed for hemorrhoids or itching.   . levothyroxine (SYNTHROID, LEVOTHROID) 75 MCG tablet TAKE 1 TABLET DAILY BEFORE BREAKFAST FOR THYROID.  Marland Kitchen lidocaine (XYLOCAINE) 2 % jelly 1 application 3 (three) times daily as needed.  . Melatonin 5 MG TABS Take by mouth at bedtime as needed.   . methenamine (HIPREX) 1 g tablet Take 1 g by mouth at bedtime.  . miconazole (ZEASORB-AF) 2 % powder Apply topically daily. Groin area  . nystatin (NYSTATIN) powder Apply topically 2 (two) times daily. Apply to inguinal rash  .  phenazopyridine (PYRIDIUM) 200 MG tablet Take 200 mg by mouth 3 (three) times daily as needed for pain.  Vladimir Faster Glycol-Propyl Glycol (SYSTANE) 0.4-0.3 % SOLN Place 2 drops into the right eye 2 (two) times daily.  . polyethylene glycol (MIRALAX / GLYCOLAX) 17 g packet Take 17 g by mouth daily.  . Probiotic Product (ALIGN) 4 MG CAPS Take 1 tablet by mouth daily.    . sodium fluoride (PREVIDENT 5000 PLUS) 1.1 % CREA dental cream Place 1 application onto teeth every evening.   No facility-administered encounter medications on file as of 08/10/2020.    Review of Systems  Constitutional: Negative for activity change, appetite change, chills, diaphoresis, fatigue, fever and unexpected weight change.  HENT: Positive for hearing loss. Negative for congestion.   Eyes: Positive for visual disturbance.  Respiratory: Negative for cough, shortness of breath and wheezing.  Cardiovascular: Negative for chest pain, palpitations and leg swelling.  Gastrointestinal: Positive for constipation and diarrhea. Negative for abdominal distention and abdominal pain.  Genitourinary: Negative for difficulty urinating and dysuria.  Musculoskeletal: Positive for gait problem. Negative for arthralgias, back pain, joint swelling and myalgias.  Neurological: Positive for weakness and numbness. Negative for dizziness, tremors, seizures, syncope, facial asymmetry, speech difficulty, light-headedness and headaches.  Psychiatric/Behavioral: Positive for confusion. Negative for agitation and behavioral problems.    Immunization History  Administered Date(s) Administered  . H1N1 05/30/2008  . Influenza Whole 03/17/2000, 03/05/2010, 03/17/2012  . Influenza, High Dose Seasonal PF 04/07/2020  . Influenza,inj,Quad PF,6+ Mos 03/14/2015, 04/07/2018, 04/15/2019  . Influenza-Unspecified 04/16/2013, 03/31/2014, 04/11/2016, 04/10/2017  . Moderna Sars-Covid-2 Vaccination 06/28/2019, 07/27/2019  . Pneumococcal Conjugate-13  09/08/2015  . Pneumococcal Polysaccharide-23 03/17/1997  . Tdap 10/09/2011  . Zoster Recombinat (Shingrix) 07/13/2017   Pertinent  Health Maintenance Due  Topic Date Due  . INFLUENZA VACCINE  Completed  . DEXA SCAN  Completed  . PNA vac Low Risk Adult  Completed   Fall Risk  07/28/2019 02/12/2019 04/08/2018 01/06/2018 11/26/2017  Falls in the past year? - 0 No Yes No  Number falls in past yr: 0 0 - 1 -  Injury with Fall? 0 0 - No -  Risk for fall due to : - Impaired balance/gait - - -  Follow up - Falls evaluation completed - - -   Functional Status Survey:    Vitals:   08/10/20 1631  Weight: 110 lb 6.4 oz (50.1 kg)   Body mass index is 23.07 kg/m. Physical Exam Vitals and nursing note reviewed.  Constitutional:      General: She is not in acute distress.    Appearance: She is not diaphoretic.  HENT:     Head: Normocephalic and atraumatic.     Nose: Nose normal.     Mouth/Throat:     Mouth: Mucous membranes are moist.     Pharynx: Oropharynx is clear.  Neck:     Vascular: No JVD.  Cardiovascular:     Rate and Rhythm: Normal rate and regular rhythm.     Heart sounds: No murmur heard.   Pulmonary:     Effort: Pulmonary effort is normal. No respiratory distress.     Breath sounds: Wheezing present.  Abdominal:     General: Abdomen is flat. Bowel sounds are normal.     Palpations: Abdomen is soft.  Musculoskeletal:     Right lower leg: No edema.     Left lower leg: No edema.  Skin:    General: Skin is warm and dry.     Findings: No erythema or rash.     Comments: Large lesions to the top of her head with brown center and mild surrounding erythema.   Neurological:     General: No focal deficit present.     Mental Status: She is alert. Mental status is at baseline.  Psychiatric:        Mood and Affect: Mood normal.     Labs reviewed: Recent Labs    01/16/20 0000  NA 137  K 4.3  CL 98*  CO2 25*  BUN 17  CREATININE 0.6  CALCIUM 9.2   No results for  input(s): AST, ALT, ALKPHOS, BILITOT, PROT, ALBUMIN in the last 8760 hours. No results for input(s): WBC, NEUTROABS, HGB, HCT, MCV, PLT in the last 8760 hours. Lab Results  Component Value Date   TSH 0.48 11/08/2019   Lab Results  Component Value Date   HGBA1C 5.9 12/02/2006   Lab Results  Component Value Date   CHOL 174 11/07/2015   HDL 79 (A) 11/07/2015   LDLCALC 78 11/07/2015   LDLDIRECT 123.7 12/08/2007   TRIG 87 11/07/2015   CHOLHDL 3 07/16/2011    Significant Diagnostic Results in last 30 days:  No results found.  Assessment/Plan 1. Seborrheic dermatitis Improved after a course of clobetasol  2. Sensorineural hearing loss (SNHL) of both ears Wears hearing aides, difficult to communicate with at times.   3. Urinary retention Continues with I and O cath regimen during the day and foley at night.  Currently on ellura and Hiprex for UTI prevention  4. Atrophic vaginitis Not currently an issue but contributes to "UTI" symptoms   5. Hereditary and idiopathic peripheral neuropathy Pain is controlled with neurontin 300 mg qhs   6. Irritable bowel syndrome with both constipation and diarrhea Doing well per pt on Align 4 mg qd and miralax 17 grams qd     Family/ staff Communication: resident   Labs/tests ordered: NA due to goals of care.

## 2020-08-18 ENCOUNTER — Encounter: Payer: Self-pay | Admitting: Adult Health

## 2020-08-18 ENCOUNTER — Non-Acute Institutional Stay (SKILLED_NURSING_FACILITY): Payer: Medicare Other | Admitting: Adult Health

## 2020-08-18 DIAGNOSIS — R509 Fever, unspecified: Secondary | ICD-10-CM | POA: Diagnosis not present

## 2020-08-18 DIAGNOSIS — R82998 Other abnormal findings in urine: Secondary | ICD-10-CM

## 2020-08-18 NOTE — Progress Notes (Signed)
Location:  [facility] Room Number: 150-A Place of Service:  SNF [phone]) Provider:  Royal Hawthorn, NP  Patient Care Team: Gayland Curry, DO as PCP - General (Geriatric Medicine) Terrance Mass, MD (Inactive) as Consulting Physician (Gynecology) Shon Hough, MD as Consulting Physician (Ophthalmology) Carolan Clines, MD (Inactive) as Consulting Physician (Urology) Gatha Mayer, MD as Consulting Physician (Gastroenterology) Wyatt Portela, MD as Consulting Physician (Oncology)  Extended Emergency Contact Information Primary Emergency Contact: Fox,Richard (Dr) Address: [address]          [place], [place] of [place] Phone: [phone] Work Phone: [phone] Relation: Alanson Puls Secondary Emergency Contact: Claudell Kyle Address: [place] [address] Johnnette Litter of [place] Mobile Phone: [phone] Relation: Son  Code Status:  DNR  Goals of care: Advanced Directive information Advanced Directives 08/18/2020  Does Patient Have a Medical Advance Directive? Yes  Type of Paramedic of [place];Living will;Out of facility DNR (pink MOST or yellow form)  Does patient want to make changes to medical advance directive? No - Patient declined  Copy of [place] in Chart? Yes - validated most recent copy scanned in chart (See row information)  Pre-existing out of facility DNR order (yellow form or pink MOST form) Yellow form placed in chart (order not valid for inpatient use);Pink MOST form placed in chart (order not valid for inpatient use)     Chief Complaint  Patient presents with  . Acute Visit    Fever    HPI:  Pt is a 87 y.o. female seen today for an acute visit for fever. This morning the resident had a temp of 101.8.  She received tylenol and now her temp is 97.9.  The nurses had noted that her urine was dark. BP was 110/48. No symptoms  of dizziness or syncope. She denies any feelings of bladder pain or dysuria. Gets catheterized during the day and has a foley at night due to prior hx of urinary retention. Nurse also reported that she was slightly weaker but still able to ambulate with her walker.   No constipation per review of [facility] records  No sore throat, cough, decreased 02 sats, body aches etc. Rapid covid negative.   Vaccinated x 3 for covid      Past Medical History:  Diagnosis Date  . Anemia, unspecified   . Asymptomatic varicose veins   . Carotid bruit    hx of  . Carotid bruit    doppler normal in the past  . Diverticulosis   . Dyslipidemia   . Esophageal reflux   . Female bladder prolapse, acquired 07/17/2010  . Fibroid   . Full incontinence of feces   . Hemorrhoid    1963 surgical excision  . History of colon cancer    Adenocarcinoma of right colon, stage T2, N1.s/p resection/chemotherapy 2002  . HTN (hypertension)   . Hx of colonic polyps    1965 surgical excision  . Hypothyroidism   . Idiopathic osteoporosis   . Idiopathic osteoporosis   . Internal hemorrhoids without mention of complication    bleed easily  . Irritable bowel syndrome   . Melanoma of skin, site unspecified   . MVP (mitral valve prolapse)   . OA (osteoarthritis)    left hip  . Osteoarthrosis, unspecified whether generalized or localized, pelvic region and thigh   . Peripheral neuropathy  hands/ feet  . 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 Spinal stenosis, unspecified region other than cervical   . Vaginal bleeding, abnormal   . Vaginitis, atrophic   . Varicose veins    Past Surgical History:  Procedure Laterality Date  . COLONOSCOPY W/ POLYPECTOMY  2006   3 mm polyp destroyed, diverticulosis  . DILATION AND CURETTAGE OF UTERUS    . ESOPHAGOGASTRODUODENOSCOPY  2009   mild gastritis  . HEMICOLECTOMY  2002   right  . [place]  . HYSTEROSCOPY    . MELANOMA EXCISION      Allergies  Allergen  Reactions  . Amoxil [Amoxicillin] Diarrhea    Has patient had a PCN reaction causing immediate rash, facial/tongue/throat swelling, SOB or lightheadedness with hypotension: no Has patient had a PCN reaction causing severe rash involving mucus membranes or skin necrosis: no Has patient had a PCN reaction that required hospitalization : no Has patient had a PCN reaction occurring within the last 10 years: no If all of the above answers are "NO", then may proceed with Cephalosporin use.   Otho Darner Allergy]     sensitivity  . Demerol     unknown  . Meperidine Hcl Other (See Comments)    Drop in blood pressure  . Infed [Iron Dextran] Rash    Became very red over face, scalp, and arms  . Neosporin [Neomycin-Bacitracin Zn-Polymyx] Rash    unknown    Outpatient Encounter Medications as of 08/18/2020  Medication Sig  . acetaminophen (TYLENOL) 650 MG CR tablet Take 1,300 mg by mouth daily.  Marland Kitchen antiseptic oral rinse (BIOTENE) LIQD by Mouth Rinse route 3 (three) times daily. 2 sprays each time  . Cranberry (ELLURA PO) Take 36 mg by mouth daily.  . Eyelid Cleansers (OCUSOFT EYELID CLEANSING) PADS Apply topically as needed.  . gabapentin (NEURONTIN) 100 MG capsule Take 300 mg by mouth at bedtime.  . hydrocortisone (ANUSOL-HC) 2.5 % rectal cream Place 1 application rectally daily as needed for hemorrhoids or itching.   Marland Kitchen ketoconazole (NIZORAL) 2 % shampoo Apply 1 application topically once a week.  . levothyroxine (SYNTHROID, LEVOTHROID) 75 MCG tablet TAKE 1 TABLET DAILY BEFORE BREAKFAST FOR THYROID.  Marland Kitchen lidocaine (XYLOCAINE) 2 % jelly 1 application 3 (three) times daily as needed.  . Melatonin 5 MG TABS Take by mouth at bedtime as needed.   . methenamine (HIPREX) 1 g tablet Take 1 g by mouth at bedtime.  . miconazole (ZEASORB-AF) 2 % powder Apply topically daily. Groin area  . nystatin (MYCOSTATIN/NYSTOP) powder Apply topically 2 (two) times daily. Apply to inguinal rash  . phenazopyridine  (PYRIDIUM) 200 MG tablet Take 200 mg by mouth every 8 (eight) hours.  Vladimir Faster Glycol-Propyl Glycol 0.4-0.3 % SOLN Place 2 drops into the right eye 2 (two) times daily.  . polyethylene glycol (MIRALAX / GLYCOLAX) 17 g packet Take 17 g by mouth daily.  . Probiotic Product (ALIGN) 4 MG CAPS Take 1 tablet by mouth daily.  . sodium fluoride (PREVIDENT 5000 PLUS) 1.1 % CREA dental cream Place 1 application onto teeth every evening.   No facility-administered encounter medications on file as of 08/18/2020.    Review of Systems  Constitutional: Positive for fever. Negative for activity change, appetite change, chills, diaphoresis, fatigue and unexpected weight change.  HENT: Positive for hearing loss. Negative for congestion and trouble swallowing.   Respiratory: Negative for cough, shortness of breath and wheezing.   Cardiovascular: Positive for leg swelling. Negative  for chest pain and palpitations.  Gastrointestinal: Negative for abdominal distention, abdominal pain, constipation and diarrhea.  Genitourinary: Negative for decreased urine volume, difficulty urinating, dysuria, flank pain, frequency, hematuria and urgency.  Musculoskeletal: Positive for gait problem. Negative for arthralgias, back pain, joint swelling and myalgias.  Skin:       Skin lesion to the top of her head.   Neurological: Positive for numbness (left foot with numbness and drop). Negative for dizziness, tremors, seizures, syncope, facial asymmetry, speech difficulty, weakness, light-headedness and headaches.  Psychiatric/Behavioral: Positive for confusion. Negative for agitation and behavioral problems.    Immunization History  Administered Date(s) Administered  . H1N1 05/30/2008  . Influenza Whole 03/17/2000, 03/05/2010, 03/17/2012  . Influenza, High Dose Seasonal PF 04/07/2020  . Influenza,inj,Quad PF,6+ Mos 03/14/2015, 04/07/2018, 04/15/2019  . Influenza-Unspecified 04/16/2013, 03/31/2014, 04/11/2016, 04/10/2017  .  Moderna SARS-COV2 Booster Vaccination 05/02/2020  . Moderna Sars-Covid-2 Vaccination 06/28/2019, 07/27/2019  . Pneumococcal Conjugate-13 09/08/2015  . Pneumococcal Polysaccharide-23 03/17/1997  . Tdap 10/09/2011  . Zoster Recombinat (Shingrix) 07/13/2017   Pertinent  Health Maintenance Due  Topic Date Due  . INFLUENZA VACCINE  Completed  . DEXA SCAN  Completed  . PNA vac Low Risk Adult  Completed   Fall Risk  07/28/2019 02/12/2019 04/08/2018 01/06/2018 11/26/2017  Falls in the past year? - 0 No Yes No  Number falls in past yr: 0 0 - 1 -  Injury with Fall? 0 0 - No -  Risk for fall due to : - Impaired balance/gait - - -  Follow up - Falls evaluation completed - - -   Functional Status Survey:    Vitals:   08/18/20 1105  BP: (!) 110/48  Pulse: 85  Resp: 14  Temp: 97.9 F (36.6 C)  SpO2: 97%   There is no height or weight on file to calculate BMI. Physical Exam Vitals and nursing note reviewed.  Constitutional:      General: She is not in acute distress.    Appearance: She is not diaphoretic.  HENT:     Head: Normocephalic and atraumatic.     Nose: Nose normal. No congestion.     Mouth/Throat:     Mouth: Mucous membranes are moist.     Pharynx: Oropharynx is clear. No oropharyngeal exudate.  Eyes:     Conjunctiva/sclera: Conjunctivae normal.     Pupils: Pupils are equal, round, and reactive to light.  Neck:     Vascular: No JVD.  Cardiovascular:     Rate and Rhythm: Normal rate and regular rhythm.     Heart sounds: No murmur heard.   Pulmonary:     Effort: Pulmonary effort is normal. No respiratory distress.     Breath sounds: Normal breath sounds. No wheezing.  Abdominal:     General: Bowel sounds are normal. There is no distension.     Palpations: Abdomen is soft.     Tenderness: There is no abdominal tenderness. There is no right CVA tenderness or left CVA tenderness.  Musculoskeletal:     Cervical back: Rigidity present. No tenderness.     Right lower leg:  No edema.     Left lower leg: No edema.  Lymphadenopathy:     Cervical: No cervical adenopathy.  Skin:    General: Skin is warm and dry.     Comments: Skin lesion raised to the top of her head x 2. Black in color with variation. No drainage or redness.   Neurological:     Mental  Status: She is alert.     Comments: Oriented to self and place but not time. Pleasant and able to f/c  Psychiatric:        Mood and Affect: Mood normal.     Labs reviewed: Recent Labs    01/16/20 0000  NA 137  K 4.3  CL 98*  CO2 25*  BUN 17  CREATININE 0.6  CALCIUM 9.2   No results for input(s): AST, ALT, ALKPHOS, BILITOT, PROT, ALBUMIN in the last 8760 hours. No results for input(s): WBC, NEUTROABS, HGB, HCT, MCV, PLT in the last 8760 hours. Lab Results  Component Value Date   TSH 0.48 11/08/2019   Lab Results  Component Value Date   HGBA1C 5.9 12/02/2006   Lab Results  Component Value Date   CHOL 174 11/07/2015   HDL 79 (A) 11/07/2015   LDLCALC 78 11/07/2015   LDLDIRECT 123.7 12/08/2007   TRIG 87 11/07/2015   CHOLHDL 3 07/16/2011    Significant Diagnostic Results in last 30 days:  No results found.  Assessment/Plan  1. Fever, unspecified fever cause Check UA C and S and Covid PCR swab Maintain enhanced isolation   2. Dark urine With low diastolic (asymptomatic) Encourage oral fluid Goals of care are comfort based Most form indicates no IVF   Family/ staff Communication: discussed with her nurse   Labs/tests ordered:  Covid swab UA C and S

## 2020-08-21 ENCOUNTER — Encounter: Payer: Self-pay | Admitting: Adult Health

## 2020-08-21 ENCOUNTER — Non-Acute Institutional Stay (SKILLED_NURSING_FACILITY): Payer: Medicare Other | Admitting: Adult Health

## 2020-08-21 DIAGNOSIS — R509 Fever, unspecified: Secondary | ICD-10-CM

## 2020-08-21 DIAGNOSIS — N8110 Cystocele, unspecified: Secondary | ICD-10-CM | POA: Diagnosis not present

## 2020-08-21 DIAGNOSIS — R230 Cyanosis: Secondary | ICD-10-CM | POA: Diagnosis not present

## 2020-08-21 DIAGNOSIS — D499 Neoplasm of unspecified behavior of unspecified site: Secondary | ICD-10-CM

## 2020-08-21 MED ORDER — MORPHINE SULFATE (CONCENTRATE) 20 MG/ML PO SOLN
5.0000 mg | ORAL | 0 refills | Status: DC | PRN
Start: 2020-08-21 — End: 2022-10-01

## 2020-08-21 NOTE — Progress Notes (Addendum)
Location:  [facility] of Service:  SNF (31) Provider:  Cindi Carbon, ANP [facility] [phone]   Gayland Curry, DO  Patient Care Team: Gayland Curry, DO as PCP - General (Geriatric Medicine) Terrance Mass, MD (Inactive) as Consulting Physician (Gynecology) Shon Hough, MD as Consulting Physician (Ophthalmology) Carolan Clines, MD (Inactive) as Consulting Physician (Urology) Gatha Mayer, MD as Consulting Physician (Gastroenterology) Wyatt Portela, MD as Consulting Physician (Oncology)  Extended Emergency Contact Information Primary Emergency Contact: Fox,Richard (Dr) Address: [address]          [place], [place] of [place] Phone: [phone] Work Phone: [phone] Relation: Alanson Puls Secondary Emergency Contact: Claudell Kyle Address: [place] [address] Johnnette Litter of [place] Mobile Phone: [phone] Relation: Son  Code Status: DNR Goals of care: Advanced Directive information Advanced Directives 08/18/2020  Does Patient Have a Medical Advance Directive? Yes  Type of Paramedic of [place];Living will;Out of facility DNR (pink MOST or yellow form)  Does patient want to make changes to medical advance directive? No - Patient declined  Copy of [place] in Chart? Yes - validated most recent copy scanned in chart (See row information)  Pre-existing out of facility DNR order (yellow form or pink MOST form) Yellow form placed in chart (order not valid for inpatient use);Pink MOST form placed in chart (order not valid for inpatient use)     Chief Complaint  Patient presents with  . Acute Visit    Bladder prolapse and mottling     HPI:  Pt is a 87 y.o. female seen today for an acute visit for bladder prolapse and mottling. Ms. Rantz developed a fever of 101.8 on 3/4.  Rapid covid and PCR swab for covid were  sent and were negative. UA C and S sent and returned with 100,000 colonies of gram negative rods sensitivity pending. Temp continued to be intermittently elevated throughout the weekend with a tmax of 103.3 on 3/6.  Currently 97.9.  On 3/6 the nurse noted bladder or possible uterine prolapse as well as rectal prolapse and mottling to the hands and pelvic area. Pt was experiencing pelvic pain and the nurse received an order for morphine on 3/6 which relieved the pain. She has a hx of urinary retention and prolapse and uses a foley at night and a I and O cath during the day. She is eating/drinking less. Output this morning 125 cc. She required oxygen last night at 5 liters due to sats in the 70s.  She would not leave the oxygen on. They took it off and the recheck was 96% but her fingers were blue.  Last BM 3/6.   She is more confused than usual. Picking the skin lesion to the top of her head which is now bleeding.  Pt has a most form indicating no hospitalizations and she has a DNR.    Past Medical History:  Diagnosis Date  . Anemia, unspecified   . Asymptomatic varicose veins   . Carotid bruit    hx of  . Carotid bruit    doppler normal in the past  . Diverticulosis   . Dyslipidemia   . Esophageal reflux   . Female bladder prolapse, acquired 07/17/2010  . Fibroid   . Full incontinence of feces   . Hemorrhoid    1963 surgical excision  . History  of colon cancer    Adenocarcinoma of right colon, stage T2, N1.s/p resection/chemotherapy 2002  . HTN (hypertension)   . Hx of colonic polyps    1965 surgical excision  . Hypothyroidism   . Idiopathic osteoporosis   . Idiopathic osteoporosis   . Internal hemorrhoids without mention of complication    bleed easily  . Irritable bowel syndrome   . Melanoma of skin, site unspecified   . MVP (mitral valve prolapse)   . OA (osteoarthritis)    left hip  . Osteoarthrosis, unspecified whether generalized or localized, pelvic region and thigh   .  Peripheral neuropathy    hands/ feet  . 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 Spinal stenosis, unspecified region other than cervical   . Vaginal bleeding, abnormal   . Vaginitis, atrophic   . Varicose veins    Past Surgical History:  Procedure Laterality Date  . COLONOSCOPY W/ POLYPECTOMY  2006   3 mm polyp destroyed, diverticulosis  . DILATION AND CURETTAGE OF UTERUS    . ESOPHAGOGASTRODUODENOSCOPY  2009   mild gastritis  . HEMICOLECTOMY  2002   right  . [place]  . HYSTEROSCOPY    . MELANOMA EXCISION      Allergies  Allergen Reactions  . Amoxil [Amoxicillin] Diarrhea    Has patient had a PCN reaction causing immediate rash, facial/tongue/throat swelling, SOB or lightheadedness with hypotension: no Has patient had a PCN reaction causing severe rash involving mucus membranes or skin necrosis: no Has patient had a PCN reaction that required hospitalization : no Has patient had a PCN reaction occurring within the last 10 years: no If all of the above answers are "NO", then may proceed with Cephalosporin use.   Otho Darner Allergy]     sensitivity  . Demerol     unknown  . Meperidine Hcl Other (See Comments)    Drop in blood pressure  . Infed [Iron Dextran] Rash    Became very red over face, scalp, and arms  . Neosporin [Neomycin-Bacitracin Zn-Polymyx] Rash    unknown    Outpatient Encounter Medications as of 08/21/2020  Medication Sig  . [DISCONTINUED] morphine (ROXANOL) 20 MG/ML concentrated solution Take 5 mg by mouth every 2 (two) hours as needed for severe pain.  Marland Kitchen acetaminophen (TYLENOL) 650 MG CR tablet Take 1,300 mg by mouth daily.  Marland Kitchen antiseptic oral rinse (BIOTENE) LIQD by Mouth Rinse route 3 (three) times daily. 2 sprays each time  . Cranberry (ELLURA PO) Take 36 mg by mouth daily.  . Eyelid Cleansers (OCUSOFT EYELID CLEANSING) PADS Apply topically as needed.  . gabapentin (NEURONTIN) 100 MG capsule Take 300 mg by mouth at bedtime.  .  hydrocortisone (ANUSOL-HC) 2.5 % rectal cream Place 1 application rectally daily as needed for hemorrhoids or itching.   Marland Kitchen ketoconazole (NIZORAL) 2 % shampoo Apply 1 application topically once a week.  . levothyroxine (SYNTHROID, LEVOTHROID) 75 MCG tablet TAKE 1 TABLET DAILY BEFORE BREAKFAST FOR THYROID.  Marland Kitchen lidocaine (XYLOCAINE) 2 % jelly 1 application 3 (three) times daily as needed.  . Melatonin 5 MG TABS Take by mouth at bedtime as needed.   . methenamine (HIPREX) 1 g tablet Take 1 g by mouth at bedtime.  . miconazole (ZEASORB-AF) 2 % powder Apply topically daily. Groin area  . morphine (ROXANOL) 20 MG/ML concentrated solution Take 0.25 mLs (5 mg total) by mouth every 2 (two) hours as needed for severe pain.  Marland Kitchen nystatin (MYCOSTATIN/NYSTOP) powder Apply topically 2 (  two) times daily. Apply to inguinal rash  . phenazopyridine (PYRIDIUM) 200 MG tablet Take 200 mg by mouth every 8 (eight) hours. (Patient not taking: Reported on 08/21/2020)  . Polyethyl Glycol-Propyl Glycol 0.4-0.3 % SOLN Place 2 drops into the right eye 2 (two) times daily.  . polyethylene glycol (MIRALAX / GLYCOLAX) 17 g packet Take 17 g by mouth daily.  . Probiotic Product (ALIGN) 4 MG CAPS Take 1 tablet by mouth daily.  . sodium fluoride (PREVIDENT 5000 PLUS) 1.1 % CREA dental cream Place 1 application onto teeth every evening.   No facility-administered encounter medications on file as of 08/21/2020.    Review of Systems  Constitutional: Positive for activity change and fever. Negative for appetite change, chills, diaphoresis, fatigue and unexpected weight change.  HENT: Positive for hearing loss. Negative for congestion.   Respiratory: Positive for cough. Negative for shortness of breath and wheezing.   Cardiovascular: Negative for chest pain, palpitations and leg swelling.  Gastrointestinal: Positive for rectal pain. Negative for abdominal distention, abdominal pain, constipation, diarrhea, nausea and vomiting.   Genitourinary: Positive for decreased urine volume, pelvic pain and vaginal bleeding. Negative for difficulty urinating, dysuria, flank pain, frequency and vaginal discharge.  Musculoskeletal: Positive for gait problem. Negative for arthralgias, back pain, joint swelling and myalgias.  Neurological: Positive for numbness. Negative for dizziness, tremors, seizures, syncope, facial asymmetry, speech difficulty, weakness, light-headedness and headaches.  Psychiatric/Behavioral: Positive for confusion. Negative for agitation and behavioral problems.    Immunization History  Administered Date(s) Administered  . H1N1 05/30/2008  . Influenza Whole 03/17/2000, 03/05/2010, 03/17/2012  . Influenza, High Dose Seasonal PF 04/07/2020  . Influenza,inj,Quad PF,6+ Mos 03/14/2015, 04/07/2018, 04/15/2019  . Influenza-Unspecified 04/16/2013, 03/31/2014, 04/11/2016, 04/10/2017  . Moderna SARS-COV2 Booster Vaccination 05/02/2020  . Moderna Sars-Covid-2 Vaccination 06/28/2019, 07/27/2019  . Pneumococcal Conjugate-13 09/08/2015  . Pneumococcal Polysaccharide-23 03/17/1997  . Tdap 10/09/2011  . Zoster Recombinat (Shingrix) 07/13/2017   Pertinent  Health Maintenance Due  Topic Date Due  . INFLUENZA VACCINE  Completed  . DEXA SCAN  Completed  . PNA vac Low Risk Adult  Completed   Fall Risk  07/28/2019 02/12/2019 04/08/2018 01/06/2018 11/26/2017  Falls in the past year? - 0 No Yes No  Number falls in past yr: 0 0 - 1 -  Injury with Fall? 0 0 - No -  Risk for fall due to : - Impaired balance/gait - - -  Follow up - Falls evaluation completed - - -   Functional Status Survey:    Vitals:   08/21/20 1024  BP: (!) 143/79  Pulse: 88  Resp: 20  Temp: 97.6 F (36.4 C)  SpO2: 96%   There is no height or weight on file to calculate BMI. Physical Exam Vitals and nursing note reviewed.  Constitutional:      General: She is not in acute distress.    Appearance: She is not diaphoretic.  HENT:     Head:  Normocephalic and atraumatic.     Mouth/Throat:     Mouth: Mucous membranes are moist.     Pharynx: Oropharynx is clear.  Neck:     Vascular: No JVD.  Cardiovascular:     Rate and Rhythm: Normal rate and regular rhythm.     Heart sounds: Murmur heard.    Pulmonary:     Effort: Pulmonary effort is normal. No respiratory distress.     Breath sounds: Rales present. No wheezing.  Abdominal:     General: There is distension (mild).  Palpations: Abdomen is soft.     Tenderness: There is no abdominal tenderness. There is no guarding.     Comments: Hypoactive x 4  Genitourinary:    Exam position: Lithotomy position.     Labia:        Right: No rash.        Left: No rash.      Urethra: No prolapse or urethral pain.     Comments: Bladder prolapsed Rectum prolapsed Bleeding noted from vaginal area  Musculoskeletal:     Right lower leg: No edema.     Left lower leg: No edema.  Skin:    General: Skin is warm and dry.     Comments: Raised and bleeding skin lesion to the top of her scalp with black discoloration  Mottling noted to fingers , pelvic area, and knees.  Neurological:     General: No focal deficit present.     Mental Status: She is alert.     Comments: Oriented to self and place but not time. Forgetful of the details of her care. MAE  Psychiatric:        Mood and Affect: Mood normal.     Labs reviewed: Recent Labs    01/16/20 0000  NA 137  K 4.3  CL 98*  CO2 25*  BUN 17  CREATININE 0.6  CALCIUM 9.2   No results for input(s): AST, ALT, ALKPHOS, BILITOT, PROT, ALBUMIN in the last 8760 hours. No results for input(s): WBC, NEUTROABS, HGB, HCT, MCV, PLT in the last 8760 hours. Lab Results  Component Value Date   TSH 0.48 11/08/2019   Lab Results  Component Value Date   HGBA1C 5.9 12/02/2006   Lab Results  Component Value Date   CHOL 174 11/07/2015   HDL 79 (A) 11/07/2015   LDLCALC 78 11/07/2015   LDLDIRECT 123.7 12/08/2007   TRIG 87 11/07/2015    CHOLHDL 3 07/16/2011    Significant Diagnostic Results in last 30 days:  No results found.  Assessment/Plan  1. Fever, unspecified fever cause She does have bacteria in her urine but given the mottling and pain in the pelvic area she likely has ischemia/fistula to abd/pelvic area causing these symptoms Her goals of care are comfort based and she does not wish to go to the hospital. I spoke with both of her sons and they would like her to be kept comfortable at [facility]. She has a DNR. Most form indicates no IVF and hospitalizations. Antibiotics to be determined but given her age of 63 and severe debility comfort care is the best approach.  D/C isolation   2. Female bladder prolapse, acquired She is experiencing pain to the pelvic area. Not able to reduce.  Continue Roxanol 5 mg q 2 prn pain  Keep foley in 24 hrs a day to help prevent difficulty placing due to the prolapse.   3. Neoplasm Noted to the top of the scalp which is quite large and bleeding  Wound care nurse to assess. Needs non adherent bandage.   4. Cyanosis Due to #1 likely septic. She will not wear oxygen.    Family/ staff Communication: discussed with her son Jenny Reichmann  Labs/tests ordered:  NA

## 2020-08-22 ENCOUNTER — Non-Acute Institutional Stay (SKILLED_NURSING_FACILITY): Payer: Medicare Other | Admitting: Internal Medicine

## 2020-08-22 DIAGNOSIS — R339 Retention of urine, unspecified: Secondary | ICD-10-CM

## 2020-08-22 DIAGNOSIS — R509 Fever, unspecified: Secondary | ICD-10-CM | POA: Diagnosis not present

## 2020-08-22 DIAGNOSIS — H547 Unspecified visual loss: Secondary | ICD-10-CM

## 2020-08-22 DIAGNOSIS — H903 Sensorineural hearing loss, bilateral: Secondary | ICD-10-CM

## 2020-08-22 DIAGNOSIS — N3000 Acute cystitis without hematuria: Secondary | ICD-10-CM

## 2020-08-22 DIAGNOSIS — Z7189 Other specified counseling: Secondary | ICD-10-CM

## 2020-08-22 DIAGNOSIS — R230 Cyanosis: Secondary | ICD-10-CM

## 2020-08-22 DIAGNOSIS — N8110 Cystocele, unspecified: Secondary | ICD-10-CM | POA: Diagnosis not present

## 2020-08-22 NOTE — Progress Notes (Signed)
Location:  Occupational psychologist of Service:  SNF (31) Provider:  Amarie Tarte L. Mariea Small, D.O., C.M.D.  [name] Curry, DO  Patient Care Team: [name] Curry, DO as PCP - General (Geriatric Medicine) [name] Mass, MD (Inactive) as Consulting Physician (Gynecology) [name] Hough, MD as Consulting Physician (Ophthalmology) [name] Clines, MD (Inactive) as Consulting Physician (Urology) [name] Mayer, MD as Consulting Physician (Gastroenterology) [name] Portela, MD as Consulting Physician (Oncology)  Extended Emergency Contact Information Primary Emergency Contact: [name] Small (Dr) Address: [address]          [place], [place] Phone: [phone] Work Phone: [phone] Relation: Alanson Puls Secondary Emergency Contact: [name] Small Address: [place] [address] [name] Small of [place] Mobile Phone: [phone] Relation: Son  Code Status:  DNR, MOST Goals of care: Advanced Directive information Advanced Directives 08/18/2020  Does Patient Have a Medical Advance Directive? Yes  Type of Paramedic of [place];Living will;Out of facility DNR (pink MOST or yellow form)  Does patient want to make changes to medical advance directive? No - Patient declined  Copy of [place] in Chart? Yes - validated most recent copy scanned in chart (See row information)  Pre-existing out of facility DNR order (yellow form or pink MOST form) Yellow form placed in chart (order not valid for inpatient use);Pink MOST form placed in chart (order not valid for inpatient use)   Chief Complaint  Patient presents with  . Acute Visit    UTI, bladder and rectal prolapse, hypoxic respiratory failure  . advance care planning    Meet with son about whether they want antibiotics for her and hospice    HPI:  Pt is a 87 y.o. female seen today for an acute visit for declining  status, UTI, ACP.    [name] Small is 76 and increasingly frail.  She's had longstanding challenges with her GI and GU systems.  She has low vision and is very HOH.  Dry erase board with large dark printing is used for communication (has had further visual decline just in past month or so).  She has had long-term wishes for comfort measures.  Has MOST in place.  She also has had cognitive decline over the past couple of years and more challenges with mobility that have led to her transfer to skilled care from [place].  Has chronic foot drop.  She's recently had an episode of hypoxic respiratory failure with some peripheral cyanosis, mottling of her extremities in context of bladder and rectal prolapse during catheter placement.  She has chronic urinary retention and has had I/O caths for years.  Frequency has had to be increased in the past several months.  She was yelling out loud in pain when I was called on call during her prolapse of both organs.  We opted to start morphine for her pain at that time due to her comfort-based goals.  I was under the impression that she was passing away at that time.  Covid ruled out.  She has become weaker.  Amid this, a UA c+s was obtained which has now grown out  E coli susceptible to all tested abx.    When seen, I met with her and her son, [name] Small, who was present visiting and we facetimed, [name] Small, into the conversation.   [name] Small was no longer c/o pain on Tuesday.  She was resting peacefully in  her recliner chair.  It took her quite a while to wake up as we communicated on the dry erase board, but she got clearer as time went on.  [name] Small and [name] Small agreed that we should treat the UTI with antibiotics to see if she would improve and to be sure she was not in distress from the infection.  We discussed her condition from Sunday night and they agreed that hospice care would be a good addition to help with her comfort and ensuring her goals are met.  [name] Small had also called the rabbi and had a  meeting with him to set up any plans around her death.    By the end of our meeting, she was much more alert and conversive.  She was aware her son was there and enjoying her time with him.  Of note also, her hypoxia has resolved and color has returned to normal.  VS wnl outside of low bp this am that improved with trendelenburg.  I also updated nephew who lives locally who is nephrologist at family request and he agreed with all above, as well.  He also asked about foley being d/c'd which I proposed we see how she does over the next few days due to her prolapse episode.   Past Medical History:  Diagnosis Date  . Anemia, unspecified   . Asymptomatic varicose veins   . Carotid bruit    hx of  . Carotid bruit    doppler normal in the past  . Diverticulosis   . Dyslipidemia   . Esophageal reflux   . Female bladder prolapse, acquired 07/17/2010  . Fibroid   . Full incontinence of feces   . Hemorrhoid    1963 surgical excision  . History of colon cancer    Adenocarcinoma of right colon, stage T2, N1.s/p resection/chemotherapy 2002  . HTN (hypertension)   . Hx of colonic polyps    1965 surgical excision  . Hypothyroidism   . Idiopathic osteoporosis   . Idiopathic osteoporosis   . Internal hemorrhoids without mention of complication    bleed easily  . Irritable bowel syndrome   . Melanoma of skin, site unspecified   . MVP (mitral valve prolapse)   . OA (osteoarthritis)    left hip  . Osteoarthrosis, unspecified whether generalized or localized, pelvic region and thigh   . Peripheral neuropathy    hands/ feet  . 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 Spinal stenosis, unspecified region other than cervical   . Vaginal bleeding, abnormal   . Vaginitis, atrophic   . Varicose veins    Past Surgical History:  Procedure Laterality Date  . COLONOSCOPY W/ POLYPECTOMY  2006   3 mm polyp destroyed, diverticulosis  . DILATION AND CURETTAGE OF UTERUS    . ESOPHAGOGASTRODUODENOSCOPY  2009    mild gastritis  . HEMICOLECTOMY  2002   right  . [place]  . HYSTEROSCOPY    . MELANOMA EXCISION      Allergies  Allergen Reactions  . Amoxil [Amoxicillin] Diarrhea    Has patient had a PCN reaction causing immediate rash, facial/tongue/throat swelling, SOB or lightheadedness with hypotension: no Has patient had a PCN reaction causing severe rash involving mucus membranes or skin necrosis: no Has patient had a PCN reaction that required hospitalization : no Has patient had a PCN reaction occurring within the last 10 years: no If all of the above answers are "NO", then may proceed with Cephalosporin use.   Elmore Guise [  Shellfish Allergy]     sensitivity  . Demerol     unknown  . Meperidine Hcl Other (See Comments)    Drop in blood pressure  . Infed [Iron Dextran] Rash    Became very red over face, scalp, and arms  . Neosporin [Neomycin-Bacitracin Zn-Polymyx] Rash    unknown    Outpatient Encounter Medications as of 08/22/2020  Medication Sig  . acetaminophen (TYLENOL) 650 MG CR tablet Take 1,300 mg by mouth daily.  Marland Kitchen antiseptic oral rinse (BIOTENE) LIQD by Mouth Rinse route 3 (three) times daily. 2 sprays each time  . Cranberry (ELLURA PO) Take 36 mg by mouth daily.  . Eyelid Cleansers (OCUSOFT EYELID CLEANSING) PADS Apply topically as needed.  . gabapentin (NEURONTIN) 100 MG capsule Take 300 mg by mouth at bedtime.  . hydrocortisone (ANUSOL-HC) 2.5 % rectal cream Place 1 application rectally daily as needed for hemorrhoids or itching.   Marland Kitchen ketoconazole (NIZORAL) 2 % shampoo Apply 1 application topically once a week.  . levothyroxine (SYNTHROID, LEVOTHROID) 75 MCG tablet TAKE 1 TABLET DAILY BEFORE BREAKFAST FOR THYROID.  Marland Kitchen lidocaine (XYLOCAINE) 2 % jelly 1 application 3 (three) times daily as needed.  . Melatonin 5 MG TABS Take by mouth at bedtime as needed.   . methenamine (HIPREX) 1 g tablet Take 1 g by mouth at bedtime.  . miconazole (ZEASORB-AF) 2 % powder  Apply topically daily. Groin area  . morphine (ROXANOL) 20 MG/ML concentrated solution Take 0.25 mLs (5 mg total) by mouth every 2 (two) hours as needed for severe pain.  Marland Kitchen nystatin (MYCOSTATIN/NYSTOP) powder Apply topically 2 (two) times daily. Apply to inguinal rash  . phenazopyridine (PYRIDIUM) 200 MG tablet Take 200 mg by mouth every 8 (eight) hours. (Patient not taking: Reported on 08/21/2020)  . Polyethyl Glycol-Propyl Glycol 0.4-0.3 % SOLN Place 2 drops into the right eye 2 (two) times daily.  . polyethylene glycol (MIRALAX / GLYCOLAX) 17 g packet Take 17 g by mouth daily.  . Probiotic Product (ALIGN) 4 MG CAPS Take 1 tablet by mouth daily.  . sodium fluoride (PREVIDENT 5000 PLUS) 1.1 % CREA dental cream Place 1 application onto teeth every evening.   No facility-administered encounter medications on file as of 08/22/2020.    Review of Systems  Constitutional: Positive for malaise/fatigue. Negative for chills and fever.  HENT: Positive for hearing loss. Negative for congestion and sore throat.   Eyes: Positive for blurred vision.  Respiratory: Negative for cough and shortness of breath.   Cardiovascular: Negative for chest pain, palpitations and leg swelling.  Gastrointestinal:       Chronic constipation, hemorrhoids and recent rectal prolapse episode  Genitourinary: Negative for dysuria.       Had bladder prolapse  Musculoskeletal: Negative for falls and joint pain.  Skin:       Likely skin cancer on scalp  Neurological: Negative for dizziness.       Chronic foot drop, walks with walker and has AFO  Psychiatric/Behavioral: Positive for memory loss. Negative for depression. The patient is nervous/anxious. The patient does not have insomnia.     Immunization History  Administered Date(s) Administered  . H1N1 05/30/2008  . Influenza Whole 03/17/2000, 03/05/2010, 03/17/2012  . Influenza, High Dose Seasonal PF 04/07/2020  . Influenza,inj,Quad PF,6+ Mos 03/14/2015, 04/07/2018,  04/15/2019  . Influenza-Unspecified 04/16/2013, 03/31/2014, 04/11/2016, 04/10/2017  . Moderna SARS-COV2 Booster Vaccination 05/02/2020  . Moderna Sars-Covid-2 Vaccination 06/28/2019, 07/27/2019  . Pneumococcal Conjugate-13 09/08/2015  . Pneumococcal Polysaccharide-23 03/17/1997  .  Tdap 10/09/2011  . Zoster Recombinat (Shingrix) 07/13/2017   Pertinent  Health Maintenance Due  Topic Date Due  . INFLUENZA VACCINE  Completed  . DEXA SCAN  Completed  . PNA vac Low Risk Adult  Completed   Fall Risk  07/28/2019 02/12/2019 04/08/2018 01/06/2018 11/26/2017  Falls in the past year? - 0 No Yes No  Number falls in past yr: 0 0 - 1 -  Injury with Fall? 0 0 - No -  Risk for fall due to : - Impaired balance/gait - - -  Follow up - Falls evaluation completed - - -   Functional Status Survey:    Vitals:   08/22/20 1121  BP: (!) 100/50  Pulse: 72  Resp: 17  Temp: (!) 97.2 F (36.2 C)  SpO2: 95%  Weight: 110 lb (49.9 kg)  Height: _0  (1.473 m)   Body Small index is 22.99 kg/m. Physical Exam Vitals reviewed.  Constitutional:      General: She is not in acute distress.    Comments: Sleeping and then lethargic, but became completely alert over visit  HENT:     Head: Normocephalic and atraumatic.     Ears:     Comments: Extremely HOH even with hearing aids Eyes:     Comments: Glasses, worsened vision even from last visit as now having to write darker and even larger with dry erase marker for her to see it  Cardiovascular:     Rate and Rhythm: Normal rate and regular rhythm.  Pulmonary:     Effort: Pulmonary effort is normal.     Breath sounds: Normal breath sounds. No rhonchi.  Abdominal:     General: Bowel sounds are normal. There is no distension.     Palpations: Abdomen is soft. There is no Small.     Tenderness: There is no abdominal tenderness. There is no guarding or rebound.  Musculoskeletal:        General: Normal range of motion.     Cervical back: Neck supple.     Right  lower leg: No edema.     Left lower leg: No edema.  Skin:    General: Skin is warm and dry.     Comments: Large area of suspected skin cancer on left posterior parietal area of scalp   Neurological:     Gait: Gait abnormal.     Comments: Has foot drop with AFO, uses walker   Psychiatric:        Mood and Affect: Mood normal.     Labs reviewed: Recent Labs    01/16/20 0000  NA 137  K 4.3  CL 98*  CO2 25*  BUN 17  CREATININE 0.6  CALCIUM 9.2   No results for input(s): AST, ALT, ALKPHOS, BILITOT, PROT, ALBUMIN in the last 8760 hours. No results for input(s): WBC, NEUTROABS, HGB, HCT, MCV, PLT in the last 8760 hours. Lab Results  Component Value Date   TSH 0.48 11/08/2019   Lab Results  Component Value Date   HGBA1C 5.9 12/02/2006   Lab Results  Component Value Date   CHOL 174 11/07/2015   HDL 79 (A) 11/07/2015   LDLCALC 78 11/07/2015   LDLDIRECT 123.7 12/08/2007   TRIG 87 11/07/2015   CHOLHDL 3 07/16/2011    Significant Diagnostic Results in last 30 days:  No results found.  Assessment/Plan 1. Fever, unspecified fever cause -resolved and has not yet had abx  2. Female bladder prolapse, acquired -recurrent; may be easier for  patient if foley kept in at this point due to this complication unless she becomes uncomfortable and wants foley d/cd  3. Cyanosis -resolved  4. Urinary retention -longstanding, was getting I/O caths, now has foley just since acute decline  5. Sensorineural hearing loss (SNHL) of both ears -advanced despite hearing aids, is almost deaf -use of dry erase board needed to communicate  6. Low vision, unspecified left eye visual impairment category, unspecified right eye visual impairment category -also has progressively worsened--uses dry erase board with large dark printing and then can see it  7. ACP (advance care planning) -met with pt, one son in person, one via facetime and nephew via phone separately for 47 mins total discussing  current status, reviewing MOST and abx treatment for UTI which they opted to pursue--keflex 54m po bid for a week -also family agreed to hospice care for [name] Small at this time -her "legal" son present was concerned about telling her about hospice and that it would affect her will to live -knowing pt and long-term wishes I explained that I don't imagine that being the case and she's been clear she wants whatever it takes to be comfortable -he was still in favor of having the support  -he'd also asked about his schedule and we agreed that we would have to see how she was doing by thurs to determine if he could travel back home or should stay   Family/ staff Communication: as above  Labs/tests ordered:  No new  Luis Sami L. Mozell Haber, D.O. [facility] [address] [place] [id number] Phone (Mon-Fri 8am-5pm):  [phone] Call:  [phone]& follow prompts after 5pm & weekends Office Phone:  [phone] Fax:  [phone]

## 2020-08-27 ENCOUNTER — Encounter: Payer: Self-pay | Admitting: Internal Medicine

## 2020-08-27 MED ORDER — CEPHALEXIN 500 MG PO CAPS: 500.0000 mg | ORAL_CAPSULE | Freq: Two times a day (BID) | ORAL | 0 refills | Status: DC

## 2020-09-28 ENCOUNTER — Non-Acute Institutional Stay (SKILLED_NURSING_FACILITY): Payer: Medicare Other | Admitting: Orthopedic Surgery

## 2020-09-28 ENCOUNTER — Encounter: Payer: Self-pay | Admitting: Orthopedic Surgery

## 2020-09-28 DIAGNOSIS — D492 Neoplasm of unspecified behavior of bone, soft tissue, and skin: Secondary | ICD-10-CM

## 2020-09-28 NOTE — Progress Notes (Signed)
Location:  [facility] Room Number: 150/A Place of Service:  SNF (31) Provider: Windell Moulding, AGNP-C  Gayland Curry, DO  Patient Care Team: Gayland Curry, DO as PCP - General (Geriatric Medicine) Terrance Mass, MD (Inactive) as Consulting Physician (Gynecology) Shon Hough, MD as Consulting Physician (Ophthalmology) Carolan Clines, MD (Inactive) as Consulting Physician (Urology) Gatha Mayer, MD as Consulting Physician (Gastroenterology) Wyatt Portela, MD as Consulting Physician (Oncology)  Extended Emergency Contact Information Primary Emergency Contact: Fox,Richard (Dr) Address: [address]          [place], [place] Phone: [phone] Work Phone: [phone] Relation: Alanson Puls Secondary Emergency Contact: Claudell Kyle Address: [place] [address] Johnnette Litter of [place] Mobile Phone: [phone] Relation: Son  Code Status: DNR Goals of care: Advanced Directive information Advanced Directives 08/18/2020  Does Patient Have a Medical Advance Directive? Yes  Type of Paramedic of [place];Living will;Out of facility DNR (pink MOST or yellow form)  Does patient want to make changes to medical advance directive? No - Patient declined  Copy of [place] in Chart? Yes - validated most recent copy scanned in chart (See row information)  Pre-existing out of facility DNR order (yellow form or pink MOST form) Yellow form placed in chart (order not valid for inpatient use);Pink MOST form placed in chart (order not valid for inpatient use)     Chief Complaint  Patient presents with  . Acute Visit    HPI:  Pt is a 87 y.o. female seen today for acute visit for scalp lesion.   In February, she was seen by dermatology for seborrheic dermatitis and probable neoplasm to scalp. She was given clobetasol for dermatitis and chose not  to treat scalp lesion.   Today, nurse reports increased drainage to scalp lesion. Drainage serosanguinous. Slight odor noted. She denies pain. PRN dressing changes consists of nonadherent bandage and Kerlix.   Nurse does not report any other concerns, vitals stable.       Past Medical History:  Diagnosis Date  . Anemia, unspecified   . Asymptomatic varicose veins   . Carotid bruit    hx of  . Carotid bruit    doppler normal in the past  . Diverticulosis   . Dyslipidemia   . Esophageal reflux   . Female bladder prolapse, acquired 07/17/2010  . Fibroid   . Full incontinence of feces   . Hemorrhoid    1963 surgical excision  . History of colon cancer    Adenocarcinoma of right colon, stage T2, N1.s/p resection/chemotherapy 2002  . HTN (hypertension)   . Hx of colonic polyps    1965 surgical excision  . Hypothyroidism   . Idiopathic osteoporosis   . Idiopathic osteoporosis   . Internal hemorrhoids without mention of complication    bleed easily  . Irritable bowel syndrome   . Melanoma of skin, site unspecified   . MVP (mitral valve prolapse)   . OA (osteoarthritis)    left hip  . Osteoarthrosis, unspecified whether generalized or localized, pelvic region and thigh   . Peripheral neuropathy    hands/ feet  . 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 Spinal stenosis, unspecified region other than cervical   . Vaginal bleeding, abnormal   . Vaginitis, atrophic   . Varicose veins    Past Surgical History:  Procedure Laterality Date  .  COLONOSCOPY W/ POLYPECTOMY  2006   3 mm polyp destroyed, diverticulosis  . DILATION AND CURETTAGE OF UTERUS    . ESOPHAGOGASTRODUODENOSCOPY  2009   mild gastritis  . HEMICOLECTOMY  2002   right  . [place]  . HYSTEROSCOPY    . MELANOMA EXCISION      Allergies  Allergen Reactions  . Amoxil [Amoxicillin] Diarrhea    Has patient had a PCN reaction causing immediate rash, facial/tongue/throat swelling, SOB or  lightheadedness with hypotension: no Has patient had a PCN reaction causing severe rash involving mucus membranes or skin necrosis: no Has patient had a PCN reaction that required hospitalization : no Has patient had a PCN reaction occurring within the last 10 years: no If all of the above answers are "NO", then may proceed with Cephalosporin use.   Otho Darner Allergy]     sensitivity  . Demerol     unknown  . Meperidine Hcl Other (See Comments)    Drop in blood pressure  . Infed [Iron Dextran] Rash    Became very red over face, scalp, and arms  . Neosporin [Neomycin-Bacitracin Zn-Polymyx] Rash    unknown    Outpatient Encounter Medications as of 09/28/2020  Medication Sig  . acetaminophen (TYLENOL) 650 MG CR tablet Take 1,300 mg by mouth daily.  Marland Kitchen antiseptic oral rinse (BIOTENE) LIQD by Mouth Rinse route 3 (three) times daily. 2 sprays each time  . cephALEXin (KEFLEX) 500 MG capsule Take 1 capsule (500 mg total) by mouth 2 (two) times daily.  . Cranberry (ELLURA PO) Take 36 mg by mouth daily.  . Eyelid Cleansers (OCUSOFT EYELID CLEANSING) PADS Apply topically as needed.  . gabapentin (NEURONTIN) 100 MG capsule Take 300 mg by mouth at bedtime.  . hydrocortisone (ANUSOL-HC) 2.5 % rectal cream Place 1 application rectally daily as needed for hemorrhoids or itching.   Marland Kitchen ketoconazole (NIZORAL) 2 % shampoo Apply 1 application topically once a week.  . levothyroxine (SYNTHROID, LEVOTHROID) 75 MCG tablet TAKE 1 TABLET DAILY BEFORE BREAKFAST FOR THYROID.  Marland Kitchen lidocaine (XYLOCAINE) 2 % jelly 1 application 3 (three) times daily as needed.  . Melatonin 5 MG TABS Take by mouth at bedtime as needed.   . methenamine (HIPREX) 1 g tablet Take 1 g by mouth at bedtime.  . miconazole (ZEASORB-AF) 2 % powder Apply topically daily. Groin area  . morphine (ROXANOL) 20 MG/ML concentrated solution Take 0.25 mLs (5 mg total) by mouth every 2 (two) hours as needed for severe pain.  Marland Kitchen nystatin  (MYCOSTATIN/NYSTOP) powder Apply topically 2 (two) times daily. Apply to inguinal rash  . Polyethyl Glycol-Propyl Glycol 0.4-0.3 % SOLN Place 2 drops into the right eye 2 (two) times daily.  . polyethylene glycol (MIRALAX / GLYCOLAX) 17 g packet Take 17 g by mouth daily.  . Probiotic Product (ALIGN) 4 MG CAPS Take 1 tablet by mouth daily.  . sodium fluoride (PREVIDENT 5000 PLUS) 1.1 % CREA dental cream Place 1 application onto teeth every evening.   No facility-administered encounter medications on file as of 09/28/2020.    Review of Systems  Constitutional: Positive for fatigue. Negative for activity change, appetite change and fever.  HENT: Positive for hearing loss.   Respiratory: Negative for cough, shortness of breath and wheezing.   Cardiovascular: Negative for chest pain and leg swelling.  Skin:       Scalp lesion  Psychiatric/Behavioral: Negative for dysphoric mood. The patient is not nervous/anxious.     Immunization  History  Administered Date(s) Administered  . H1N1 05/30/2008  . Influenza Whole 03/17/2000, 03/05/2010, 03/17/2012  . Influenza, High Dose Seasonal PF 04/07/2020  . Influenza,inj,Quad PF,6+ Mos 03/14/2015, 04/07/2018, 04/15/2019  . Influenza-Unspecified 04/16/2013, 03/31/2014, 04/11/2016, 04/10/2017  . Moderna SARS-COV2 Booster Vaccination 05/02/2020  . Moderna Sars-Covid-2 Vaccination 06/28/2019, 07/27/2019  . Pneumococcal Conjugate-13 09/08/2015  . Pneumococcal Polysaccharide-23 03/17/1997  . Tdap 10/09/2011  . Zoster Recombinat (Shingrix) 07/13/2017   Pertinent  Health Maintenance Due  Topic Date Due  . INFLUENZA VACCINE  01/15/2021  . DEXA SCAN  Completed  . PNA vac Low Risk Adult  Completed   Fall Risk  07/28/2019 02/12/2019 04/08/2018 01/06/2018 11/26/2017  Falls in the past year? - 0 No Yes No  Number falls in past yr: 0 0 - 1 -  Injury with Fall? 0 0 - No -  Risk for fall due to : - Impaired balance/gait - - -  Follow up - Falls evaluation  completed - - -   Functional Status Survey:    Vitals:   09/28/20 1347  BP: 117/69  Pulse: 73  Resp: 18  Temp: 97.7 F (36.5 C)  SpO2: 95%  Weight: 101 lb 9.6 oz (46.1 kg)   Body mass index is 21.23 kg/m. Physical Exam Vitals reviewed.  Constitutional:      General: She is not in acute distress. Cardiovascular:     Rate and Rhythm: Normal rate and regular rhythm.     Pulses: Normal pulses.     Heart sounds: Normal heart sounds. No murmur heard.   Pulmonary:     Effort: Pulmonary effort is normal. No respiratory distress.     Breath sounds: Normal breath sounds. No wheezing.  Skin:    General: Skin is warm and dry.     Capillary Refill: Capillary refill takes less than 2 seconds.     Comments: Quarter sized lesion to top of head/scalp, shape irregular, brown-black color. Lesion open in middle with granulation tissue present. Serosanguinous drainage with mild odor present. Moderate amount of drainage on bandage observed.   Neurological:     General: No focal deficit present.     Mental Status: She is alert. Mental status is at baseline.     Motor: Weakness present.     Gait: Gait abnormal.     Comments: walker  Psychiatric:        Mood and Affect: Mood normal.        Behavior: Behavior normal.     Labs reviewed: Recent Labs    01/16/20 0000  NA 137  K 4.3  CL 98*  CO2 25*  BUN 17  CREATININE 0.6  CALCIUM 9.2   No results for input(s): AST, ALT, ALKPHOS, BILITOT, PROT, ALBUMIN in the last 8760 hours. No results for input(s): WBC, NEUTROABS, HGB, HCT, MCV, PLT in the last 8760 hours. Lab Results  Component Value Date   TSH 0.48 11/08/2019   Lab Results  Component Value Date   HGBA1C 5.9 12/02/2006   Lab Results  Component Value Date   CHOL 174 11/07/2015   HDL 79 (A) 11/07/2015   LDLCALC 78 11/07/2015   LDLDIRECT 123.7 12/08/2007   TRIG 87 11/07/2015   CHOLHDL 3 07/16/2011    Significant Diagnostic Results in last 30 days:  No results  found.  Assessment/Plan 1. Neoplasm of scalp - highly suspicious for malignancy - recently seen by dermatology and did not want to seek treatment - referral to dermatology for suggestions regarding drainage control -  start daily dressing changes- cleanse with saline, cover with petroleum gauze, then ABD, and apply stockinette for support   Family/ staff Communication: plan discussed with patient and nurse  Labs/tests ordered: none

## 2020-10-05 ENCOUNTER — Non-Acute Institutional Stay (SKILLED_NURSING_FACILITY): Payer: Medicare Other | Admitting: Adult Health

## 2020-10-05 ENCOUNTER — Encounter: Payer: Self-pay | Admitting: Adult Health

## 2020-10-05 DIAGNOSIS — D492 Neoplasm of unspecified behavior of bone, soft tissue, and skin: Secondary | ICD-10-CM | POA: Insufficient documentation

## 2020-10-05 NOTE — Progress Notes (Signed)
Location:  [facility] of Service:  SNF (31) Provider:  Cindi Carbon, ANP [place] [phone]   Gayland Curry, DO  Patient Care Team: Gayland Curry, DO as PCP - General (Geriatric Medicine) Terrance Mass, MD (Inactive) as Consulting Physician (Gynecology) Shon Hough, MD as Consulting Physician (Ophthalmology) Carolan Clines, MD (Inactive) as Consulting Physician (Urology) Gatha Mayer, MD as Consulting Physician (Gastroenterology) Wyatt Portela, MD as Consulting Physician (Oncology)  Extended Emergency Contact Information Primary Emergency Contact: Fox,Richard (Dr) Address: [address]          [place], [place] Phone: [phone] Work Phone: [phone] Relation: Alanson Puls Secondary Emergency Contact: Claudell Kyle Address: [place] [address] Johnnette Litter of [place] Mobile Phone: [phone] Relation: Son  Code Status:  DNR Goals of care: Advanced Directive information Advanced Directives 08/18/2020  Does Patient Have a Medical Advance Directive? Yes  Type of Paramedic of [place];Living will;Out of facility DNR (pink MOST or yellow form)  Does patient want to make changes to medical advance directive? No - Patient declined  Copy of [place] in Chart? Yes - validated most recent copy scanned in chart (See row information)  Pre-existing out of facility DNR order (yellow form or pink MOST form) Yellow form placed in chart (order not valid for inpatient use);Pink MOST form placed in chart (order not valid for inpatient use)     Chief Complaint  Patient presents with  . Acute Visit    Skin cancer bleeding     HPI:  Pt is a 87 y.o. female seen today for an acute visit for bleeding scalp lesion. There is large neoplasm of the scalp on the top of her head that is increasing quickly and causing  bleeding. The area can be tender and painful. Mostly bloody drainage, no purulent. Mild odor. No fever. Since the area bleeds it is cumbersome to her and the staff and is causing hair matting. The staff has petroleum gauze without help. She was evaluated by dermatology with no new orders. She had previously declined surgery for the area. A member of the family that is a physician has asked that we try another dressing style to help with bleeding.    Past Medical History:  Diagnosis Date  . Anemia, unspecified   . Asymptomatic varicose veins   . Carotid bruit    hx of  . Carotid bruit    doppler normal in the past  . Diverticulosis   . Dyslipidemia   . Esophageal reflux   . Female bladder prolapse, acquired 07/17/2010  . Fibroid   . Full incontinence of feces   . Hemorrhoid    1963 surgical excision  . History of colon cancer    Adenocarcinoma of right colon, stage T2, N1.s/p resection/chemotherapy 2002  . HTN (hypertension)   . Hx of colonic polyps    1965 surgical excision  . Hypothyroidism   . Idiopathic osteoporosis   . Idiopathic osteoporosis   . Internal hemorrhoids without mention of complication    bleed easily  . Irritable bowel syndrome   . Melanoma of skin, site unspecified   . MVP (mitral valve prolapse)   . OA (osteoarthritis)    left hip  . Osteoarthrosis, unspecified whether generalized or localized, pelvic region and thigh   . Peripheral neuropathy    hands/ feet  .  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 Spinal stenosis, unspecified region other than cervical   . Vaginal bleeding, abnormal   . Vaginitis, atrophic   . Varicose veins    Past Surgical History:  Procedure Laterality Date  . COLONOSCOPY W/ POLYPECTOMY  2006   3 mm polyp destroyed, diverticulosis  . DILATION AND CURETTAGE OF UTERUS    . ESOPHAGOGASTRODUODENOSCOPY  2009   mild gastritis  . HEMICOLECTOMY  2002   right  . [place]  . HYSTEROSCOPY    . MELANOMA EXCISION       Allergies  Allergen Reactions  . Amoxil [Amoxicillin] Diarrhea    Has patient had a PCN reaction causing immediate rash, facial/tongue/throat swelling, SOB or lightheadedness with hypotension: no Has patient had a PCN reaction causing severe rash involving mucus membranes or skin necrosis: no Has patient had a PCN reaction that required hospitalization : no Has patient had a PCN reaction occurring within the last 10 years: no If all of the above answers are "NO", then may proceed with Cephalosporin use.   Otho Darner Allergy]     sensitivity  . Demerol     unknown  . Meperidine Hcl Other (See Comments)    Drop in blood pressure  . Infed [Iron Dextran] Rash    Became very red over face, scalp, and arms  . Neosporin [Neomycin-Bacitracin Zn-Polymyx] Rash    unknown    Outpatient Encounter Medications as of 10/05/2020  Medication Sig  . acetaminophen (TYLENOL) 650 MG CR tablet Take 1,300 mg by mouth daily.  Marland Kitchen antiseptic oral rinse (BIOTENE) LIQD by Mouth Rinse route 3 (three) times daily. 2 sprays each time  . cephALEXin (KEFLEX) 500 MG capsule Take 1 capsule (500 mg total) by mouth 2 (two) times daily.  . Cranberry (ELLURA PO) Take 36 mg by mouth daily.  . Eyelid Cleansers (OCUSOFT EYELID CLEANSING) PADS Apply topically as needed.  . gabapentin (NEURONTIN) 100 MG capsule Take 300 mg by mouth at bedtime.  . hydrocortisone (ANUSOL-HC) 2.5 % rectal cream Place 1 application rectally daily as needed for hemorrhoids or itching.   Marland Kitchen ketoconazole (NIZORAL) 2 % shampoo Apply 1 application topically once a week.  . levothyroxine (SYNTHROID, LEVOTHROID) 75 MCG tablet TAKE 1 TABLET DAILY BEFORE BREAKFAST FOR THYROID.  Marland Kitchen lidocaine (XYLOCAINE) 2 % jelly 1 application 3 (three) times daily as needed.  . Melatonin 5 MG TABS Take by mouth at bedtime as needed.   . methenamine (HIPREX) 1 g tablet Take 1 g by mouth at bedtime.  . miconazole (ZEASORB-AF) 2 % powder Apply topically daily.  Groin area  . morphine (ROXANOL) 20 MG/ML concentrated solution Take 0.25 mLs (5 mg total) by mouth every 2 (two) hours as needed for severe pain.  Marland Kitchen nystatin (MYCOSTATIN/NYSTOP) powder Apply topically 2 (two) times daily. Apply to inguinal rash  . Polyethyl Glycol-Propyl Glycol 0.4-0.3 % SOLN Place 2 drops into the right eye 2 (two) times daily.  . polyethylene glycol (MIRALAX / GLYCOLAX) 17 g packet Take 17 g by mouth daily.  . Probiotic Product (ALIGN) 4 MG CAPS Take 1 tablet by mouth daily.  . sodium fluoride (PREVIDENT 5000 PLUS) 1.1 % CREA dental cream Place 1 application onto teeth every evening.   No facility-administered encounter medications on file as of 10/05/2020.    Review of Systems  Skin: Positive for wound (skin lesion raised and bleeding to the scalp). Negative for color change, pallor and rash.    Immunization History  Administered Date(s) Administered  . H1N1 05/30/2008  . Influenza Whole 03/17/2000, 03/05/2010, 03/17/2012  . Influenza, High Dose Seasonal PF 04/07/2020  . Influenza,inj,Quad PF,6+ Mos 03/14/2015, 04/07/2018, 04/15/2019  . Influenza-Unspecified 04/16/2013, 03/31/2014, 04/11/2016, 04/10/2017  . Moderna SARS-COV2 Booster Vaccination 05/02/2020  . Moderna Sars-Covid-2 Vaccination 06/28/2019, 07/27/2019  . Pneumococcal Conjugate-13 09/08/2015  . Pneumococcal Polysaccharide-23 03/17/1997  . Tdap 10/09/2011  . Zoster Recombinat (Shingrix) 07/13/2017   Pertinent  Health Maintenance Due  Topic Date Due  . INFLUENZA VACCINE  01/15/2021  . DEXA SCAN  Completed  . PNA vac Low Risk Adult  Completed   Fall Risk  07/28/2019 02/12/2019 04/08/2018 01/06/2018 11/26/2017  Falls in the past year? - 0 No Yes No  Number falls in past yr: 0 0 - 1 -  Injury with Fall? 0 0 - No -  Risk for fall due to : - Impaired balance/gait - - -  Follow up - Falls evaluation completed - - -   Functional Status Survey:    There were no vitals filed for this visit. There is no  height or weight on file to calculate BMI. Physical Exam Vitals and nursing note reviewed.  Constitutional:      Comments: Frail elderly female   Skin:    General: Skin is warm and dry.     Findings: Lesion (4cm height raised neoplasm size of a golf ball with black and red tissue. Mildy tender to touch. Surroundin bleeding. Mild odor. NO swelling or purulent matter. ) present.  Neurological:     Mental Status: She is alert.     Labs reviewed: Recent Labs    01/16/20 0000  NA 137  K 4.3  CL 98*  CO2 25*  BUN 17  CREATININE 0.6  CALCIUM 9.2   No results for input(s): AST, ALT, ALKPHOS, BILITOT, PROT, ALBUMIN in the last 8760 hours. No results for input(s): WBC, NEUTROABS, HGB, HCT, MCV, PLT in the last 8760 hours. Lab Results  Component Value Date   TSH 0.48 11/08/2019   Lab Results  Component Value Date   HGBA1C 5.9 12/02/2006   Lab Results  Component Value Date   CHOL 174 11/07/2015   HDL 79 (A) 11/07/2015   LDLCALC 78 11/07/2015   LDLDIRECT 123.7 12/08/2007   TRIG 87 11/07/2015   CHOLHDL 3 07/16/2011    Significant Diagnostic Results in last 30 days:  No results found.  Assessment/Plan 1. Neoplasm of scalp There is bleeding to the wound that is bothersome to the patient and staff. The pt and family declined treatment for this lesion early on due to her age of 72 and now the area is enlarging and bleeding due to the scalp location.   I have asked the staff to order a gel foam dressing that may help with bleeding and recommend changing q 3 days and prn soiling. Wound nurse to follow. If no improvement refer to wound care center    Family/ staff Communication: discussed with family member Dr. Salem Senate  Labs/tests ordered:  NA

## 2020-10-19 ENCOUNTER — Non-Acute Institutional Stay (SKILLED_NURSING_FACILITY): Payer: Medicare Other | Admitting: Adult Health

## 2020-10-19 ENCOUNTER — Encounter: Payer: Self-pay | Admitting: Adult Health

## 2020-10-19 DIAGNOSIS — R634 Abnormal weight loss: Secondary | ICD-10-CM | POA: Diagnosis not present

## 2020-10-19 DIAGNOSIS — G609 Hereditary and idiopathic neuropathy, unspecified: Secondary | ICD-10-CM

## 2020-10-19 DIAGNOSIS — R339 Retention of urine, unspecified: Secondary | ICD-10-CM

## 2020-10-19 DIAGNOSIS — D492 Neoplasm of unspecified behavior of bone, soft tissue, and skin: Secondary | ICD-10-CM | POA: Diagnosis not present

## 2020-10-19 DIAGNOSIS — N8110 Cystocele, unspecified: Secondary | ICD-10-CM

## 2020-10-19 DIAGNOSIS — E039 Hypothyroidism, unspecified: Secondary | ICD-10-CM | POA: Diagnosis not present

## 2020-10-19 DIAGNOSIS — H903 Sensorineural hearing loss, bilateral: Secondary | ICD-10-CM

## 2020-10-19 NOTE — Progress Notes (Signed)
Location:    [facility] Room Number: 150 Place of Service:  SNF [phone]) Provider:  Royal Hawthorn, NP  Gayland Curry, DO  Patient Care Team: Gayland Curry, DO as PCP - General (Geriatric Medicine) Terrance Mass, MD (Inactive) as Consulting Physician (Gynecology) Shon Hough, MD as Consulting Physician (Ophthalmology) Carolan Clines, MD (Inactive) as Consulting Physician (Urology) Gatha Mayer, MD as Consulting Physician (Gastroenterology) Wyatt Portela, MD as Consulting Physician (Oncology)  Extended Emergency Contact Information Primary Emergency Contact: Fox,Richard (Dr) Address: [address]          [place], [place] Phone: [phone] Work Phone: [phone] Relation: Alanson Puls Secondary Emergency Contact: Claudell Kyle Address: [place] [address] Johnnette Litter of [place] Mobile Phone: [phone] Relation: Son  Code Status:  DNR Goals of care: Advanced Directive information Advanced Directives 10/19/2020  Does Patient Have a Medical Advance Directive? Yes  Type of Paramedic of [place];Out of facility DNR (pink MOST or yellow form);Living will  Does patient want to make changes to medical advance directive? No - Patient declined  Copy of [place] in Chart? Yes - validated most recent copy scanned in chart (See row information)  Pre-existing out of facility DNR order (yellow form or pink MOST form) Yellow form placed in chart (order not valid for inpatient use);Pink MOST form placed in chart (order not valid for inpatient use)     Chief Complaint  Patient presents with  . Medical Management of Chronic Issues    HPI:  [name] Small is a 87 y.o. female seen today for medical management of chronic diseases.    Continues with bleeding to a neoplasm that is enlarging to the top of her scalp. The nurses have tried zetuvit  and I recommended gel foam. There were issues getting this dressing and so now they have obtained chitoflex type dressing for hemostasis with a cap to secure the area. She is not having pain but the bleeding is bothersome  She is hard of hearing and has visual issues and uses a white board to communicate.  She has lost 10 lbs in the past few months due to an acute illness with UTI and mottling of unclear etiology in March. The mottling resolved with antibiotics. There was concern for ischemia at the time due to the mottling an that her bladder and rectum fell at the same time.  Her goals of care are comfort based and so we did not investigate it further.  Wt Readings from Last 3 Encounters:  10/19/20 100 lb 12.8 oz (45.7 kg)  09/28/20 101 lb 9.6 oz (46.1 kg)  08/22/20 110 lb (49.9 kg)   She remains ambulatory   Has a foley now all the time but no fever or bladder pain.   Past Medical History:  Diagnosis Date  . Anemia, unspecified   . Asymptomatic varicose veins   . Carotid bruit    hx of  . Carotid bruit    doppler normal in the past  . Diverticulosis   . Dyslipidemia   . Esophageal reflux   . Female bladder prolapse, acquired 07/17/2010  . Fibroid   . Full incontinence of feces   . Hemorrhoid    1963 surgical excision  . History of colon cancer    Adenocarcinoma of right colon, stage T2, N1.s/p resection/chemotherapy 2002  . HTN (hypertension)   .  Hx of colonic polyps    1965 surgical excision  . Hypothyroidism   . Idiopathic osteoporosis   . Idiopathic osteoporosis   . Internal hemorrhoids without mention of complication    bleed easily  . Irritable bowel syndrome   . Melanoma of skin, site unspecified   . MVP (mitral valve prolapse)   . OA (osteoarthritis)    left hip  . Osteoarthrosis, unspecified whether generalized or localized, pelvic region and thigh   . Peripheral neuropathy    hands/ feet  . 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 Spinal stenosis, unspecified  region other than cervical   . Vaginal bleeding, abnormal   . Vaginitis, atrophic   . Varicose veins    Past Surgical History:  Procedure Laterality Date  . COLONOSCOPY W/ POLYPECTOMY  2006   3 mm polyp destroyed, diverticulosis  . DILATION AND CURETTAGE OF UTERUS    . ESOPHAGOGASTRODUODENOSCOPY  2009   mild gastritis  . HEMICOLECTOMY  2002   right  . [place]  . HYSTEROSCOPY    . MELANOMA EXCISION      Allergies  Allergen Reactions  . Amoxil [Amoxicillin] Diarrhea    Has patient had a PCN reaction causing immediate rash, facial/tongue/throat swelling, SOB or lightheadedness with hypotension: no Has patient had a PCN reaction causing severe rash involving mucus membranes or skin necrosis: no Has patient had a PCN reaction that required hospitalization : no Has patient had a PCN reaction occurring within the last 10 years: no If all of the above answers are "NO", then may proceed with Cephalosporin use.   Otho Darner Allergy]     sensitivity  . Demerol     unknown  . Meperidine Hcl Other (See Comments)    Drop in blood pressure  . Infed [Iron Dextran] Rash    Became very red over face, scalp, and arms  . Neosporin [Neomycin-Bacitracin Zn-Polymyx] Rash    unknown    Allergies as of 10/19/2020      Reactions   Amoxil [amoxicillin] Diarrhea   Has patient had a PCN reaction causing immediate rash, facial/tongue/throat swelling, SOB or lightheadedness with hypotension: no Has patient had a PCN reaction causing severe rash involving mucus membranes or skin necrosis: no Has patient had a PCN reaction that required hospitalization : no Has patient had a PCN reaction occurring within the last 10 years: no If all of the above answers are "NO", then may proceed with Cephalosporin use.   Crab [shellfish Allergy]    sensitivity   Demerol    unknown   Meperidine Hcl Other (See Comments)   Drop in blood pressure   Infed [iron Dextran] Rash   Became very red  over face, scalp, and arms   Neosporin [neomycin-bacitracin Zn-polymyx] Rash   unknown      Medication List       Accurate as of Oct 19, 2020 11:30 AM. If you have any questions, ask your nurse or doctor.        STOP taking these medications   cephALEXin 500 MG capsule Commonly known as: KEFLEX Stopped by: Royal Hawthorn, NP     TAKE these medications   acetaminophen 650 MG CR tablet Commonly known as: TYLENOL Take 1,300 mg by mouth daily.   Align 4 MG Caps Take 1 tablet by mouth daily.   antiseptic oral rinse Liqd by Mouth Rinse route 3 (three) times daily. 2 sprays each time   ELLURA PO Take 36 mg by mouth  daily.   gabapentin 100 MG capsule Commonly known as: NEURONTIN Take 300 mg by mouth at bedtime.   hydrocortisone 2.5 % rectal cream Commonly known as: ANUSOL-HC Place 1 application rectally daily as needed for hemorrhoids or itching.   ketoconazole 2 % shampoo Commonly known as: NIZORAL Apply 1 application topically once a week.   levothyroxine 75 MCG tablet Commonly known as: SYNTHROID TAKE 1 TABLET DAILY BEFORE BREAKFAST FOR THYROID.   lidocaine 2 % jelly Commonly known as: XYLOCAINE 1 application 3 (three) times daily as needed.   melatonin 5 MG Tabs Take by mouth at bedtime as needed.   methenamine 1 g tablet Commonly known as: HIPREX Take 1 g by mouth at bedtime.   mineral oil-hydrophilic petrolatum ointment Apply topically as needed for dry skin.   morphine 20 MG/ML concentrated solution Commonly known as: ROXANOL Take 0.25 mLs (5 mg total) by mouth every 2 (two) hours as needed for severe pain.   nystatin powder Commonly known as: MYCOSTATIN/NYSTOP Apply topically 2 (two) times daily. Apply to inguinal rash   OcuSoft Eyelid Cleansing Pads Apply topically as needed.   phenazopyridine 200 MG tablet Commonly known as: PYRIDIUM Take 200 mg by mouth every 8 (eight) hours as needed for pain.   Polyethyl Glycol-Propyl Glycol 0.4-0.3 %  Soln Place 2 drops into the right eye 2 (two) times daily.   polyethylene glycol 17 g packet Commonly known as: MIRALAX / GLYCOLAX Take 17 g by mouth daily.   sodium fluoride 1.1 % Crea dental cream Commonly known as: PREVIDENT 5000 PLUS Place 1 application onto teeth every evening.   Zeasorb-AF 2 % powder Generic drug: miconazole Apply topically daily. Groin area       Review of Systems  Constitutional: Positive for unexpected weight change. Negative for activity change, appetite change, chills, diaphoresis, fatigue and fever.  HENT: Positive for hearing loss. Negative for congestion and trouble swallowing.        Dry mouth  Respiratory: Negative for cough, shortness of breath and wheezing.   Cardiovascular: Negative for chest pain, palpitations and leg swelling.  Gastrointestinal: Negative for abdominal distention, abdominal pain, constipation and diarrhea.  Genitourinary: Negative for difficulty urinating, dysuria and pelvic pain.  Musculoskeletal: Negative for arthralgias, back pain, gait problem, joint swelling and myalgias.  Skin: Positive for wound.  Neurological: Negative for dizziness, tremors, seizures, syncope, facial asymmetry, speech difficulty, weakness, light-headedness, numbness and headaches.  Psychiatric/Behavioral: Positive for confusion. Negative for agitation and behavioral problems.    Immunization History  Administered Date(s) Administered  . H1N1 05/30/2008  . Influenza Whole 03/17/2000, 03/05/2010, 03/17/2012  . Influenza, High Dose Seasonal PF 04/07/2020  . Influenza,inj,Quad PF,6+ Mos 03/14/2015, 04/07/2018, 04/15/2019  . Influenza-Unspecified 04/16/2013, 03/31/2014, 04/11/2016, 04/10/2017  . Moderna SARS-COV2 Booster Vaccination 05/02/2020  . Moderna Sars-Covid-2 Vaccination 06/28/2019, 07/27/2019  . Pneumococcal Conjugate-13 09/08/2015  . Pneumococcal Polysaccharide-23 03/17/1997  . Tdap 10/09/2011  . Zoster Recombinat (Shingrix) 07/13/2017    Pertinent  Health Maintenance Due  Topic Date Due  . INFLUENZA VACCINE  01/15/2021  . DEXA SCAN  Completed  . PNA vac Low Risk Adult  Completed   Fall Risk  07/28/2019 02/12/2019 04/08/2018 01/06/2018 11/26/2017  Falls in the past year? - 0 No Yes No  Number falls in past yr: 0 0 - 1 -  Injury with Fall? 0 0 - No -  Risk for fall due to : - Impaired balance/gait - - -  Follow up - Falls evaluation completed - - -  Functional Status Survey:    Vitals:   10/19/20 1117  BP: (!) 105/54  Pulse: 62  Resp: 18  Temp: 98.3 F (36.8 C)  SpO2: 95%  Weight: 100 lb 12.8 oz (45.7 kg)  Height: 4\' 10"  (1.473 m)   Body mass index is 21.07 kg/m. Physical Exam Vitals and nursing note reviewed.  Constitutional:      General: She is not in acute distress.    Appearance: She is not diaphoretic.  HENT:     Head: Normocephalic and atraumatic.     Mouth/Throat:     Mouth: Mucous membranes are dry.  Neck:     Vascular: No JVD.  Cardiovascular:     Rate and Rhythm: Normal rate and regular rhythm.     Heart sounds: No murmur heard.   Pulmonary:     Effort: Pulmonary effort is normal. No respiratory distress.     Breath sounds: Rhonchi present. No wheezing.  Abdominal:     General: Bowel sounds are normal. There is no distension.     Palpations: Abdomen is soft.  Musculoskeletal:     Right lower leg: No edema.     Left lower leg: No edema.  Skin:    General: Skin is warm and dry.     Comments: Large raised bleeding neoplasm to the scalp with no surrounding redness or purulent drainage.   Neurological:     General: No focal deficit present.     Mental Status: She is alert. Mental status is at baseline.     Labs reviewed: Recent Labs    01/16/20 0000  NA 137  K 4.3  CL 98*  CO2 25*  BUN 17  CREATININE 0.6  CALCIUM 9.2   No results for input(s): AST, ALT, ALKPHOS, BILITOT, PROT, ALBUMIN in the last 8760 hours. No results for input(s): WBC, NEUTROABS, HGB, HCT, MCV, PLT in  the last 8760 hours. Lab Results  Component Value Date   TSH 0.48 11/08/2019   Lab Results  Component Value Date   HGBA1C 5.9 12/02/2006   Lab Results  Component Value Date   CHOL 174 11/07/2015   HDL 79 (A) 11/07/2015   LDLCALC 78 11/07/2015   LDLDIRECT 123.7 12/08/2007   TRIG 87 11/07/2015   CHOLHDL 3 07/16/2011    Significant Diagnostic Results in last 30 days:  No results found.  Assessment/Plan  1. Neoplasm of scalp Palliative dressing with chitoflex to help with bleeding, changes per [facility] staff If no improvement with bleeding consider wound care referral  2. Urinary retention Continues with foley Changes per [facility]   3. Weight loss Due to decline and acute illness  Goals of care are comfort based   4. Hypothyroidism, unspecified type Check TSH  5. Hereditary and idiopathic peripheral neuropathy With left foot drop Continue neurontin for pain control  6. Sensorineural hearing loss (SNHL) of both ears Has hearing aides Use white board to communicate   7. Female bladder prolapse, acquired Now needs continuous foley No acute concerns today   Labs/tests ordered:  TSH

## 2020-10-20 ENCOUNTER — Encounter: Payer: Self-pay | Admitting: Adult Health

## 2020-10-23 LAB — TSH: TSH: 0.19 — AB (ref 0.41–5.90)

## 2020-10-31 ENCOUNTER — Other Ambulatory Visit: Payer: Self-pay | Admitting: Orthopedic Surgery

## 2020-11-07 ENCOUNTER — Encounter (HOSPITAL_BASED_OUTPATIENT_CLINIC_OR_DEPARTMENT_OTHER): Payer: Medicare Other | Attending: Internal Medicine | Admitting: Internal Medicine

## 2020-11-07 ENCOUNTER — Other Ambulatory Visit: Payer: Self-pay

## 2020-11-07 DIAGNOSIS — Z885 Allergy status to narcotic agent status: Secondary | ICD-10-CM | POA: Diagnosis not present

## 2020-11-07 DIAGNOSIS — F0150 Vascular dementia without behavioral disturbance: Secondary | ICD-10-CM | POA: Diagnosis not present

## 2020-11-07 DIAGNOSIS — Z880 Allergy status to penicillin: Secondary | ICD-10-CM | POA: Insufficient documentation

## 2020-11-07 DIAGNOSIS — D492 Neoplasm of unspecified behavior of bone, soft tissue, and skin: Secondary | ICD-10-CM | POA: Insufficient documentation

## 2020-11-07 DIAGNOSIS — H353230 Exudative age-related macular degeneration, bilateral, stage unspecified: Secondary | ICD-10-CM | POA: Diagnosis not present

## 2020-11-07 DIAGNOSIS — H903 Sensorineural hearing loss, bilateral: Secondary | ICD-10-CM | POA: Insufficient documentation

## 2020-11-07 DIAGNOSIS — R220 Localized swelling, mass and lump, head: Secondary | ICD-10-CM | POA: Diagnosis present

## 2020-11-07 NOTE — Progress Notes (Signed)
TYLISHA, SPIKES ([id number]) Visit Report for 11/07/2020 Abuse/Suicide Risk Screen Details Patient Name: Date of Service: [name] Small 11/07/2020 1:15 PM Medical Record Number: [id number] Patient Account Number: [id number] Date of Birth/Sex: Treating RN: [birth year] (87 y.o. Female) Zenaida Deed Primary Care Brooklynne Pereida: Fletcher Anon Other Clinician: Referring Xhaiden Coombs: Treating Dezaray Shibuya/Extender: Melissa Noon, CHRISTINA Weeks in Treatment: 0 Abuse/Suicide Risk Screen Items Answer ABUSE RISK SCREEN: Has anyone close to you tried to hurt or harm you recentlyo No Do you feel uncomfortable with anyone in your familyo No Has anyone forced you do things that you didnt want to doo No Electronic Signature(s) Signed: 11/07/2020 5:08:08 PM By: Zenaida Deed RN, BSN Entered By: Zenaida Deed on 11/07/2020 13:35:02 -------------------------------------------------------------------------------- Activities of Daily Living Details Patient Name: Date of Service: [name] Small 11/07/2020 1:15 PM Medical Record Number: [id number] Patient Account Number: [id number] Date of Birth/Sex: Treating RN: [birth year] (87 y.o. Female) Zenaida Deed Primary Care Damita Eppard: Fletcher Anon Other Clinician: Referring Dekayla Prestridge: Treating Kinslea Frances/Extender: Melissa Noon, CHRISTINA Weeks in Treatment: 0 Activities of Daily Living Items Answer Activities of Daily Living (Please select one for each item) Drive Automobile Not Able T Medications ake Need Assistance Use T elephone Need Assistance Care for Appearance Need Assistance Use T oilet Need Assistance Bath / Shower Need Assistance Dress Self Need Assistance Feed Self Completely Able Walk Need Assistance Get In / Out Bed Need Assistance Housework Not Able Prepare Meals Not Able Handle Money Not Able Shop for Self Not Able Electronic Signature(s) Signed: 11/07/2020 5:08:08 PM By: Zenaida Deed RN,  BSN Entered By: Zenaida Deed on 11/07/2020 13:35:41 -------------------------------------------------------------------------------- Education Screening Details Patient Name: Date of Service: Nicole Cella, DEBO RA H L. 11/07/2020 1:15 PM Medical Record Number: [id number] Patient Account Number: [id number] Date of Birth/Sex: Treating RN: 1917/02/27 (87 y.o. Female) Zenaida Deed Primary Care Jazzalynn Rhudy: Fletcher Anon Other Clinician: Referring Arsh Feutz: Treating Raymond Azure/Extender: Melissa Noon, CHRISTINA Weeks in Treatment: 0 Primary Learner Assessed: Patient Learning Preferences/Education Level/Primary Language Learning Preference: Explanation, Demonstration, Printed Material Preferred Language: English Cognitive Barrier Language Barrier: No Translator Needed: No Memory Deficit: No Emotional Barrier: No Cultural/Religious Beliefs Affecting Medical Care: No Physical Barrier Impaired Vision: Yes Glasses Impaired Hearing: Yes Complete Loss, Hearing Aid Decreased Hand dexterity: No Knowledge/Comprehension Knowledge Level: High Comprehension Level: High Ability to understand written instructions: High Ability to understand verbal instructions: High Motivation Anxiety Level: Calm Cooperation: Cooperative Education Importance: Acknowledges Need Interest in Health Problems: Asks Questions Perception: Coherent Willingness to Engage in Self-Management High Activities: Readiness to Engage in Self-Management High Activities: Electronic Signature(s) Signed: 11/07/2020 5:08:08 PM By: Zenaida Deed RN, BSN Entered By: Zenaida Deed on 11/07/2020 13:36:26 -------------------------------------------------------------------------------- Fall Risk Assessment Details Patient Name: Date of Service: Nicole Cella, DEBO RA H L. 11/07/2020 1:15 PM Medical Record Number: [id number] Patient Account Number: [id number] Date of Birth/Sex: Treating RN: 07-21-1916 (87 y.o. Female)  Zenaida Deed Primary Care Morrell Fluke: Fletcher Anon Other Clinician: Referring Henrry Feil: Treating Gunnard Dorrance/Extender: Melissa Noon, CHRISTINA Weeks in Treatment: 0 Fall Risk Assessment Items Have you had 2 or more falls in the last 12 monthso 0 No Have you had any fall that resulted in injury in the last 12 monthso 0 No FALLS RISK SCREEN History of falling - immediate or within 3 months 0 No Secondary diagnosis (Do you have 2 or more medical diagnoseso) 0 No Ambulatory aid None/bed rest/wheelchair/nurse 0 No Crutches/cane/walker 15 Yes Furniture 0 No Intravenous therapy Access/Saline/Heparin Lock 0 No Gait/Transferring Normal/ bed  rest/ wheelchair 0 No Weak (short steps with or without shuffle, stooped but able to lift head while walking, may seek 10 Yes support from furniture) Impaired (short steps with shuffle, may have difficulty arising from chair, head down, impaired 0 No balance) Mental Status Oriented to own ability 0 Yes Electronic Signature(s) Signed: 11/07/2020 5:08:08 PM By: Zenaida Deed RN, BSN Entered By: Zenaida Deed on 11/07/2020 13:37:23 -------------------------------------------------------------------------------- Foot Assessment Details Patient Name: Date of Service: Nicole Cella, DEBO RA H L. 11/07/2020 1:15 PM Medical Record Number: [id number] Patient Account Number: [id number] Date of Birth/Sex: Treating RN: 01/03/1917 (87 y.o. Female) Zenaida Deed Primary Care Syrai Gladwin: Fletcher Anon Other Clinician: Referring Zollie Ellery: Treating Azaya Goedde/Extender: Melissa Noon, CHRISTINA Weeks in Treatment: 0 Foot Assessment Items Site Locations + = Sensation present, - = Sensation absent, C = Callus, U = Ulcer R = Redness, W = Warmth, M = Maceration, PU = Pre-ulcerative lesion F = Fissure, S = Swelling, D = Dryness Assessment Right: Left: Other Deformity: No No Prior Foot Ulcer: No No Prior Amputation: No No Charcot Joint: No  No Ambulatory Status: Ambulatory With Help Assistance Device: Walker Gait: Surveyor, mining) Signed: 11/07/2020 5:08:08 PM By: Zenaida Deed RN, BSN Entered By: Zenaida Deed on 11/07/2020 13:39:57 -------------------------------------------------------------------------------- Nutrition Risk Screening Details Patient Name: Date of Service: Nicole Cella, DEBO RA H L. 11/07/2020 1:15 PM Medical Record Number: [id number] Patient Account Number: [id number] Date of Birth/Sex: Treating RN: 12/22/1916 (87 y.o. Female) Zenaida Deed Primary Care Hancel Ion: Fletcher Anon Other Clinician: Referring Kelda Azad: Treating Olivya Sobol/Extender: Melissa Noon, CHRISTINA Weeks in Treatment: 0 Height (in): 62 Weight (lbs): Body Mass Index (BMI): Nutrition Risk Screening Items Score Screening NUTRITION RISK SCREEN: I have an illness or condition that made me change the kind and/or amount of food I eat 0 No I eat fewer than two meals per day 0 No I eat few fruits and vegetables, or milk products 0 No I have three or more drinks of beer, liquor or wine almost every day 0 No I have tooth or mouth problems that make it hard for me to eat 0 No I don't always have enough money to buy the food I need 0 No I eat alone most of the time 0 No I take three or more different prescribed or over-the-counter drugs a day 0 No Without wanting to, I have lost or gained 10 pounds in the last six months 2 Yes I am not always physically able to shop, cook and/or feed myself 0 No Nutrition Protocols Good Risk Protocol 0 No interventions needed Moderate Risk Protocol High Risk Proctocol Risk Level: Good Risk Score: 2 Electronic Signature(s) Signed: 11/07/2020 5:08:08 PM By: Zenaida Deed RN, BSN Entered By: Zenaida Deed on 11/07/2020 13:39:45

## 2020-11-08 NOTE — Progress Notes (Addendum)
0 Complex Wound Measurement - multiple wounds X- 1 5 Simple Wound Cleansing - one wound []  - 0 Complex Wound Cleansing - multiple wounds INTERVENTIONS - Wound Dressings []  - 0 Small Wound Dressing one or multiple wounds X- 1 15 Medium Wound Dressing one or multiple wounds []  - 0 Large Wound Dressing one or multiple wounds []  - 0 Application of Medications - injection INTERVENTIONS - Miscellaneous []  - 0 External ear exam []  - 0 Specimen Collection (cultures, biopsies, blood, body fluids, etc.) []  - 0 Specimen(s) / Culture(s) sent or taken to Lab for analysis []  - 0 Patient Transfer (multiple staff / [employer] fast streamer / Similar devices) []  - 0 Simple Staple / Suture removal (25 or less) []  - 0 Complex Staple / Suture removal (26 or more) []  - 0 Hypo / Hyperglycemic Management (close monitor of Blood Glucose) []  - 0 Ankle / Brachial Index (ABI) - do not check if billed separately Has the patient been seen at the hospital within the last three years: Yes Total Score: 150 Level Of Care: New/Established - Level 4 Electronic Signature(s) Signed: 11/08/2020 4:26:30 PM By: Rhae Hammock RN Entered By: Rhae Hammock on 11/07/2020 15:05:33 -------------------------------------------------------------------------------- Encounter Discharge Information Details Patient Name: Date of Service: [name] Small, [name] Small. 11/07/2020 1:15 PM Medical Record Number: [id number] Patient Account Number: [id number] Date of Birth/Sex: Treating RN: [birth year] (87 y.o. Female)  Lorrin Jackson Primary Care Byanca Kasper: Royal Hawthorn Other Clinician: Referring Geanette Buonocore: Treating Denesia Donelan/Extender: Dawayne Cirri, CHRISTINA Weeks in Treatment: 0 Encounter Discharge Information Items Discharge Condition: Stable Ambulatory Status: Wheelchair Discharge Destination: Home Transportation: Private Auto Schedule Follow-up Appointment: Yes Clinical Summary of Care: Provided on 11/07/2020 Form Type Recipient Paper Patient Patient Electronic Signature(s) Signed: 11/07/2020 3:21:40 PM By: Lorrin Jackson Entered By: Lorrin Jackson on 11/07/2020 15:21:40 -------------------------------------------------------------------------------- Lower Extremity Assessment Details Patient Name: Date of Service: [name] Small. 11/07/2020 1:15 PM Medical Record Number: [id number] Patient Account Number: [id number] Date of Birth/Sex: Treating RN: January 20, 1917 (87 y.o. Female) Baruch Gouty Primary Care Hagen Bohorquez: Royal Hawthorn Other Clinician: Referring Jadalynn Burr: Treating Alondra Vandeven/Extender: Dawayne Cirri, CHRISTINA Weeks in Treatment: 0 Electronic Signature(s) Signed: 11/07/2020 5:08:08 PM By: Baruch Gouty RN, BSN Entered By: Baruch Gouty on 11/07/2020 13:40:05 -------------------------------------------------------------------------------- Multi Wound Chart Details Patient Name: Date of Service: [name] Small, [name] Small. 11/07/2020 1:15 PM Medical Record Number: [id number] Patient Account Number: [id number] Date of Birth/Sex: Treating RN: 04-18-1917 (87 y.o. Female) Rhae Hammock Primary Care Rameen Gohlke: Royal Hawthorn Other Clinician: Referring Jeramie Scogin: Treating Shyna Duignan/Extender: Dawayne Cirri, CHRISTINA Weeks in Treatment: 0 Vital Signs Height(in): 60 Pulse(bpm): 67 Weight(lbs): Blood Pressure(mmHg): 133/61 Body Mass Index(BMI): Temperature(F): 98.3 Respiratory Rate(breaths/min): 18 Photos: [N/A:N/A] Head - Parietal N/A  N/A Wound Location: Gradually Appeared N/A N/A Wounding Event: Malignant Wound N/A N/A Primary Etiology: Anemia, Hypertension, Peripheral N/A N/A Comorbid History: Venous Disease, Osteoarthritis, Neuropathy 06/17/2020 N/A N/A Date Acquired: 0 N/A N/A Weeks of Treatment: Open N/A N/A Wound Status: 12x15x0.1 N/A N/A Measurements Small x W x D (cm) 141.372 N/A N/A A (cm) : rea 14.137 N/A N/A Volume (cm) : 0.00% N/A N/A % Reduction in Area: 0.00% N/A N/A % Reduction in Volume: Full Thickness Without Exposed N/A N/A Classification: Support Structures Large N/A N/A Exudate Amount: Sanguinous N/A N/A Exudate Type: red N/A N/A Exudate Color: Yes N/A N/A Foul Odor A Cleansing: fter No N/A N/A Odor Anticipated Due to Product Use: Indistinct, nonvisible N/A N/A Wound Margin: N/A N/A N/A Necrotic Amount: Eschar N/A  N/A Necrotic Tissue: Fat Layer (Subcutaneous Tissue): Yes N/A N/A Exposed Structures: Fascia: No Tendon: No Muscle: No Joint: No Bone: No None N/A N/A Epithelialization: Treatment Notes Wound #1 (Head - Parietal) Cleanser Wound Cleanser Discharge Instruction: Cleanse the wound with wound cleanser prior to applying a clean dressing using gauze sponges, not tissue or cotton balls. Peri-Wound Care Topical Primary Dressing Dakin's Solution 0.125%, 16 (oz) Discharge Instruction: Moisten gauze with Dakin's solution Secondary Dressing Zetuvit Plus 4x8 in Discharge Instruction: Apply over primary dressing as directed. CarboFLEX Odor Control Dressing, 4x4 in Discharge Instruction: Apply over primary dressing as directed. Secured With [employer], 4.5x3.1 (in/yd) Discharge Instruction: Secure with Kerlix as directed. 61M Medipore H Soft Cloth Surgical T 4 x 2 (in/yd) ape Discharge Instruction: Secure dressing with tape as directed. apply netting over dressing to hold onto pt.'s head Compression Wrap Compression Stockings Add-Ons Electronic  Signature(s) Signed: 11/14/2020 9:42:05 AM By: Kalman Shan DO Signed: 11/27/2020 10:01:01 AM By: Rhae Hammock RN Entered By: Kalman Shan on 11/14/2020 09:27:42 -------------------------------------------------------------------------------- Multi-Disciplinary Care Plan Details Patient Name: Date of Service: [name] Small, [name] Small. 11/07/2020 1:15 PM Medical Record Number: [id number] Patient Account Number: [id number] Date of Birth/Sex: Treating RN: 09-05-1916 (87 y.o. Female) Rhae Hammock Primary Care Karinna Beadles: Royal Hawthorn Other Clinician: Referring Amarri Michaelson: Treating Eugenia Eldredge/Extender: Dawayne Cirri, CHRISTINA Weeks in Treatment: 0 Active Inactive Wound/Skin Impairment Nursing Diagnoses: Impaired tissue integrity Goals: Patient will have a decrease in wound volume by X% from date: (specify in notes) Date Initiated: 11/07/2020 Target Resolution Date: 11/18/2020 Goal Status: Active Interventions: Assess patient/caregiver ability to obtain necessary supplies Notes: Electronic Signature(s) Signed: 11/08/2020 4:26:30 PM By: Rhae Hammock RN Entered By: Rhae Hammock on 11/07/2020 15:04:12 -------------------------------------------------------------------------------- Pain Assessment Details Patient Name: Date of Service: [name] Small, [name] Small. 11/07/2020 1:15 PM Medical Record Number: [id number] Patient Account Number: [id number] Date of Birth/Sex: Treating RN: 01-10-1917 (87 y.o. Female) Baruch Gouty Primary Care Kenyette Gundy: Royal Hawthorn Other Clinician: Referring Temekia Caskey: Treating Meleni Delahunt/Extender: Dawayne Cirri, CHRISTINA Weeks in Treatment: 0 Active Problems Location of Pain Severity and Description of Pain Patient Has Paino No Site Locations Rate the pain. Current Pain Level: 0 Pain Management and Medication Current Pain Management: Electronic Signature(s) Signed: 11/07/2020 5:08:08 PM By: Baruch Gouty RN,  BSN Entered By: Baruch Gouty on 11/07/2020 13:49:18 -------------------------------------------------------------------------------- Patient/Caregiver Education Details Patient Name: Date of Service: [name] Small, [name] RA Nelva Bush 5/24/2022andnbsp1:15 PM Medical Record Number: [id number] Patient Account Number: [id number] Date of Birth/Gender: Treating RN: [birth year] (87 y.o. Female) Rhae Hammock Primary Care Physician: Royal Hawthorn Other Clinician: Referring Physician: Treating Physician/Extender: Dawayne Cirri, CHRISTINA Weeks in Treatment: 0 Education Assessment Education Provided To: Patient and Caregiver Education Topics Provided Basic Hygiene: Methods: Explain/Verbal Responses: State content correctly Wound/Skin Impairment: Methods: Explain/Verbal Responses: State content correctly Electronic Signature(s) Signed: 11/08/2020 4:26:30 PM By: Rhae Hammock RN Entered By: Rhae Hammock on 11/07/2020 15:04:25 -------------------------------------------------------------------------------- Wound Assessment Details Patient Name: Date of Service: [name] Small, [name] Small. 11/07/2020 1:15 PM Medical Record Number: [id number] Patient Account Number: [id number] Date of Birth/Sex: Treating RN: 1917/02/02 (87 y.o. 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 Wound Status: Open Wounding Event: Gradually Appeared Comorbid Anemia, Hypertension, Peripheral Venous Disease, Date Acquired: 06/17/2020 History: Osteoarthritis, Neuropathy Weeks Of Treatment: 0 Clustered Wound: No Photos Wound Measurements Length: (cm)  0 Complex Wound Measurement - multiple wounds X- 1 5 Simple Wound Cleansing - one wound []  - 0 Complex Wound Cleansing - multiple wounds INTERVENTIONS - Wound Dressings []  - 0 Small Wound Dressing one or multiple wounds X- 1 15 Medium Wound Dressing one or multiple wounds []  - 0 Large Wound Dressing one or multiple wounds []  - 0 Application of Medications - injection INTERVENTIONS - Miscellaneous []  - 0 External ear exam []  - 0 Specimen Collection (cultures, biopsies, blood, body fluids, etc.) []  - 0 Specimen(s) / Culture(s) sent or taken to Lab for analysis []  - 0 Patient Transfer (multiple staff / [employer] fast streamer / Similar devices) []  - 0 Simple Staple / Suture removal (25 or less) []  - 0 Complex Staple / Suture removal (26 or more) []  - 0 Hypo / Hyperglycemic Management (close monitor of Blood Glucose) []  - 0 Ankle / Brachial Index (ABI) - do not check if billed separately Has the patient been seen at the hospital within the last three years: Yes Total Score: 150 Level Of Care: New/Established - Level 4 Electronic Signature(s) Signed: 11/08/2020 4:26:30 PM By: Rhae Hammock RN Entered By: Rhae Hammock on 11/07/2020 15:05:33 -------------------------------------------------------------------------------- Encounter Discharge Information Details Patient Name: Date of Service: [name] Small, [name] Small. 11/07/2020 1:15 PM Medical Record Number: [id number] Patient Account Number: [id number] Date of Birth/Sex: Treating RN: [birth year] (87 y.o. Female)  Lorrin Jackson Primary Care Byanca Kasper: Royal Hawthorn Other Clinician: Referring Geanette Buonocore: Treating Denesia Donelan/Extender: Dawayne Cirri, CHRISTINA Weeks in Treatment: 0 Encounter Discharge Information Items Discharge Condition: Stable Ambulatory Status: Wheelchair Discharge Destination: Home Transportation: Private Auto Schedule Follow-up Appointment: Yes Clinical Summary of Care: Provided on 11/07/2020 Form Type Recipient Paper Patient Patient Electronic Signature(s) Signed: 11/07/2020 3:21:40 PM By: Lorrin Jackson Entered By: Lorrin Jackson on 11/07/2020 15:21:40 -------------------------------------------------------------------------------- Lower Extremity Assessment Details Patient Name: Date of Service: [name] Small. 11/07/2020 1:15 PM Medical Record Number: [id number] Patient Account Number: [id number] Date of Birth/Sex: Treating RN: January 20, 1917 (87 y.o. Female) Baruch Gouty Primary Care Hagen Bohorquez: Royal Hawthorn Other Clinician: Referring Jadalynn Burr: Treating Alondra Vandeven/Extender: Dawayne Cirri, CHRISTINA Weeks in Treatment: 0 Electronic Signature(s) Signed: 11/07/2020 5:08:08 PM By: Baruch Gouty RN, BSN Entered By: Baruch Gouty on 11/07/2020 13:40:05 -------------------------------------------------------------------------------- Multi Wound Chart Details Patient Name: Date of Service: [name] Small, [name] Small. 11/07/2020 1:15 PM Medical Record Number: [id number] Patient Account Number: [id number] Date of Birth/Sex: Treating RN: 04-18-1917 (87 y.o. Female) Rhae Hammock Primary Care Rameen Gohlke: Royal Hawthorn Other Clinician: Referring Jeramie Scogin: Treating Shyna Duignan/Extender: Dawayne Cirri, CHRISTINA Weeks in Treatment: 0 Vital Signs Height(in): 60 Pulse(bpm): 67 Weight(lbs): Blood Pressure(mmHg): 133/61 Body Mass Index(BMI): Temperature(F): 98.3 Respiratory Rate(breaths/min): 18 Photos: [N/A:N/A] Head - Parietal N/A  N/A Wound Location: Gradually Appeared N/A N/A Wounding Event: Malignant Wound N/A N/A Primary Etiology: Anemia, Hypertension, Peripheral N/A N/A Comorbid History: Venous Disease, Osteoarthritis, Neuropathy 06/17/2020 N/A N/A Date Acquired: 0 N/A N/A Weeks of Treatment: Open N/A N/A Wound Status: 12x15x0.1 N/A N/A Measurements Small x W x D (cm) 141.372 N/A N/A A (cm) : rea 14.137 N/A N/A Volume (cm) : 0.00% N/A N/A % Reduction in Area: 0.00% N/A N/A % Reduction in Volume: Full Thickness Without Exposed N/A N/A Classification: Support Structures Large N/A N/A Exudate Amount: Sanguinous N/A N/A Exudate Type: red N/A N/A Exudate Color: Yes N/A N/A Foul Odor A Cleansing: fter No N/A N/A Odor Anticipated Due to Product Use: Indistinct, nonvisible N/A N/A Wound Margin: N/A N/A N/A Necrotic Amount: Eschar N/A  0 Complex Wound Measurement - multiple wounds X- 1 5 Simple Wound Cleansing - one wound []  - 0 Complex Wound Cleansing - multiple wounds INTERVENTIONS - Wound Dressings []  - 0 Small Wound Dressing one or multiple wounds X- 1 15 Medium Wound Dressing one or multiple wounds []  - 0 Large Wound Dressing one or multiple wounds []  - 0 Application of Medications - injection INTERVENTIONS - Miscellaneous []  - 0 External ear exam []  - 0 Specimen Collection (cultures, biopsies, blood, body fluids, etc.) []  - 0 Specimen(s) / Culture(s) sent or taken to Lab for analysis []  - 0 Patient Transfer (multiple staff / [employer] fast streamer / Similar devices) []  - 0 Simple Staple / Suture removal (25 or less) []  - 0 Complex Staple / Suture removal (26 or more) []  - 0 Hypo / Hyperglycemic Management (close monitor of Blood Glucose) []  - 0 Ankle / Brachial Index (ABI) - do not check if billed separately Has the patient been seen at the hospital within the last three years: Yes Total Score: 150 Level Of Care: New/Established - Level 4 Electronic Signature(s) Signed: 11/08/2020 4:26:30 PM By: Rhae Hammock RN Entered By: Rhae Hammock on 11/07/2020 15:05:33 -------------------------------------------------------------------------------- Encounter Discharge Information Details Patient Name: Date of Service: [name] Small, [name] Small. 11/07/2020 1:15 PM Medical Record Number: [id number] Patient Account Number: [id number] Date of Birth/Sex: Treating RN: [birth year] (87 y.o. Female)  Lorrin Jackson Primary Care Byanca Kasper: Royal Hawthorn Other Clinician: Referring Geanette Buonocore: Treating Denesia Donelan/Extender: Dawayne Cirri, CHRISTINA Weeks in Treatment: 0 Encounter Discharge Information Items Discharge Condition: Stable Ambulatory Status: Wheelchair Discharge Destination: Home Transportation: Private Auto Schedule Follow-up Appointment: Yes Clinical Summary of Care: Provided on 11/07/2020 Form Type Recipient Paper Patient Patient Electronic Signature(s) Signed: 11/07/2020 3:21:40 PM By: Lorrin Jackson Entered By: Lorrin Jackson on 11/07/2020 15:21:40 -------------------------------------------------------------------------------- Lower Extremity Assessment Details Patient Name: Date of Service: [name] Small. 11/07/2020 1:15 PM Medical Record Number: [id number] Patient Account Number: [id number] Date of Birth/Sex: Treating RN: January 20, 1917 (87 y.o. Female) Baruch Gouty Primary Care Hagen Bohorquez: Royal Hawthorn Other Clinician: Referring Jadalynn Burr: Treating Alondra Vandeven/Extender: Dawayne Cirri, CHRISTINA Weeks in Treatment: 0 Electronic Signature(s) Signed: 11/07/2020 5:08:08 PM By: Baruch Gouty RN, BSN Entered By: Baruch Gouty on 11/07/2020 13:40:05 -------------------------------------------------------------------------------- Multi Wound Chart Details Patient Name: Date of Service: [name] Small, [name] Small. 11/07/2020 1:15 PM Medical Record Number: [id number] Patient Account Number: [id number] Date of Birth/Sex: Treating RN: 04-18-1917 (87 y.o. Female) Rhae Hammock Primary Care Rameen Gohlke: Royal Hawthorn Other Clinician: Referring Jeramie Scogin: Treating Shyna Duignan/Extender: Dawayne Cirri, CHRISTINA Weeks in Treatment: 0 Vital Signs Height(in): 60 Pulse(bpm): 67 Weight(lbs): Blood Pressure(mmHg): 133/61 Body Mass Index(BMI): Temperature(F): 98.3 Respiratory Rate(breaths/min): 18 Photos: [N/A:N/A] Head - Parietal N/A  N/A Wound Location: Gradually Appeared N/A N/A Wounding Event: Malignant Wound N/A N/A Primary Etiology: Anemia, Hypertension, Peripheral N/A N/A Comorbid History: Venous Disease, Osteoarthritis, Neuropathy 06/17/2020 N/A N/A Date Acquired: 0 N/A N/A Weeks of Treatment: Open N/A N/A Wound Status: 12x15x0.1 N/A N/A Measurements Small x W x D (cm) 141.372 N/A N/A A (cm) : rea 14.137 N/A N/A Volume (cm) : 0.00% N/A N/A % Reduction in Area: 0.00% N/A N/A % Reduction in Volume: Full Thickness Without Exposed N/A N/A Classification: Support Structures Large N/A N/A Exudate Amount: Sanguinous N/A N/A Exudate Type: red N/A N/A Exudate Color: Yes N/A N/A Foul Odor A Cleansing: fter No N/A N/A Odor Anticipated Due to Product Use: Indistinct, nonvisible N/A N/A Wound Margin: N/A N/A N/A Necrotic Amount: Eschar N/A

## 2020-11-14 NOTE — Progress Notes (Signed)
[name] Small ([id number]) Visit Report for 11/07/2020 Chief Complaint Document Details Patient Name: Date of Service: [name] Small 11/07/2020 1:15 PM Medical Record Number: [id number] Patient Account Number: [id number] Date of Birth/Sex: Treating RN: Jun 20, 1916 (87 y.o. Female) Rhae Hammock Primary Care Provider: Royal Hawthorn Other Clinician: Referring Provider: Treating Provider/Extender: Dawayne Cirri, CHRISTINA Weeks in Treatment: 0 Information Obtained from: Patient Chief Complaint Mass to the top of the head that drains serosanguinous fluid and has odor Electronic Signature(s) Signed: 11/14/2020 9:42:05 AM By: Kalman Shan DO Entered By: Kalman Shan on 11/14/2020 09:29:08 -------------------------------------------------------------------------------- HPI Details Patient Name: Date of Service: [name] Small, [name] Small. 11/07/2020 1:15 PM Medical Record Number: [id number] Patient Account Number: [id number] Date of Birth/Sex: Treating RN: 16-Oct-1916 (87 y.o. Female) Rhae Hammock Primary Care Provider: Royal Hawthorn Other Clinician: Referring Provider: Treating Provider/Extender: Dawayne Cirri, CHRISTINA Weeks in Treatment: 0 History of Present Illness HPI Description: Admission 5/24 [name] Small is 87 year old female with a past medical history of dementia, and sensorineural hearing loss of both ears and bilateral macular degeneration that presents to our clinic for palliative wound care of a lesion to her scalp. 3 months ago patient was seen by dermatology for a scalp lesion and it was deemed likely a neoplasm. No biopsy was taken as family would not want to treat this. Over the past 3 months she has had difficulty with controlling drainage and odor to the mass. She has a hard time participating in a conversation due to hearing loss. Currently she denies pain or signs of infection. Electronic Signature(s) Signed:  11/14/2020 9:42:05 AM By: Kalman Shan DO Entered By: Kalman Shan on 11/14/2020 09:33:52 -------------------------------------------------------------------------------- Physical Exam Details Patient Name: Date of Service: [name] Small, [name] Small. 11/07/2020 1:15 PM Medical Record Number: [id number] Patient Account Number: [id number] Date of Birth/Sex: Treating RN: [birth year] (87 y.o. Female) Rhae Hammock Primary Care Provider: Royal Hawthorn Other Clinician: Referring Provider: Treating Provider/Extender: Dawayne Cirri, CHRISTINA Weeks in Treatment: 0 Constitutional respirations regular, non-labored and within target range for patient.Marland Kitchen Psychiatric pleasant and cooperative. Notes Large mass to the parietal region of the head with serosanguineous drainage and odor. Discoloration to the mass. There is gelatinous debris that sloughed off during exam. No obvious signs of infection. Electronic Signature(s) Signed: 11/14/2020 9:42:05 AM By: Kalman Shan DO Entered By: Kalman Shan on 11/14/2020 09:37:42 -------------------------------------------------------------------------------- Physician Orders Details Patient Name: Date of Service: [name] Small, [name] Small. 11/07/2020 1:15 PM Medical Record Number: [id number] Patient Account Number: [id number] Date of Birth/Sex: Treating RN: 1917/05/05 (87 y.o. 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 - Dr.  Heber [place] Return A Bathing/ Shower/ Hygiene May shower with protection but do not get wound dressing(s) wet. Wound Treatment Wound #1 - Head - Parietal Cleanser: Wound Cleanser 2 x Per UYQ/03 Days Discharge Instructions: Cleanse the wound with wound cleanser prior to applying a clean dressing using gauze sponges, not tissue or cotton balls. Prim Dressing: Dakin's Solution 0.125%, 16 (oz) 2 x Per Day/15 Days ary Discharge Instructions: Moisten gauze with Dakin's solution Secondary Dressing: Zetuvit Plus 4x8 in 2 x Per  Day/15 Days Discharge Instructions: Apply over primary dressing as directed. Secondary Dressing: CarboFLEX Odor Control Dressing, 4x4 in 2 x Per Day/15 Days Discharge Instructions: Apply over primary dressing as directed. Secured With: [employer], 4.5x3.1 (in/yd) 2 x Per Day/15 Days Discharge Instructions: Secure with Kerlix as directed. Secured With: 58M Medipore H Soft Cloth Surgical T 4 x 2 (in/yd) 2 x Per Day/15 Days ape Discharge Instructions: Secure dressing with tape as directed. Secured With: apply netting over dressing to hold onto pt.'s head 2 x Per HKV/42 Days Electronic Signature(s) Signed: 11/14/2020 9:42:05 AM By: Kalman Shan DO Previous Signature: 11/08/2020 4:26:30 PM Version By: Rhae Hammock RN Entered By: Kalman Shan on 11/14/2020 09:39:16 -------------------------------------------------------------------------------- Problem List Details Patient Name: Date of Service: [name] Small, [name] Small. 11/07/2020 1:15 PM Medical Record Number: [id number] Patient Account Number: [id number] Date of Birth/Sex: Treating RN: 14-Apr-1917 (87 y.o. Female) Rhae Hammock Primary Care Provider: Royal Hawthorn Other Clinician: Referring Provider: Treating Provider/Extender: Dawayne Cirri, CHRISTINA Weeks in Treatment: 0 Active Problems ICD-10 Encounter Code Description Active Date MDM Diagnosis D49.2 Neoplasm of unspecified  behavior of bone, soft tissue, and skin 11/07/2020 No Yes H35.3230 Exudative age-related macular degeneration, bilateral, stage unspecified 11/14/2020 No Yes H90.3 Sensorineural hearing loss, bilateral 11/14/2020 No Yes F01.50 Vascular dementia without behavioral disturbance 11/14/2020 No Yes Inactive Problems Resolved Problems Electronic Signature(s) Signed: 11/14/2020 9:42:05 AM By: Kalman Shan DO Entered By: Kalman Shan on 11/14/2020 09:26:31 -------------------------------------------------------------------------------- Progress Note Details Patient Name: Date of Service: [name] Small, [name] Small. 11/07/2020 1:15 PM Medical Record Number: [id number] Patient Account Number: [id number] Date of Birth/Sex: Treating RN: [birth year] (87 y.o. Female) Rhae Hammock Primary Care Provider: Royal Hawthorn Other Clinician: Referring Provider: Treating Provider/Extender: Dawayne Cirri, CHRISTINA Weeks in Treatment: 0 Subjective Chief Complaint Information obtained from Patient Mass to the top of the head that drains serosanguinous fluid and has odor History of Present Illness (HPI) Admission 5/24 Ms. Verdella Laidlaw is 87 year old female with a past medical history of dementia, and sensorineural hearing loss of both ears and bilateral macular degeneration that presents to our clinic for palliative wound care of a lesion to her scalp. 3 months ago patient was seen by dermatology for a scalp lesion and it was deemed likely a neoplasm. No biopsy was taken as family would not want to treat this. Over the past 3 months she has had difficulty with controlling drainage and odor to the mass. She has a hard time participating in a conversation due to hearing loss. Currently she denies pain or signs of infection. Patient History Allergies amoxicillin (Reaction: unknown), crab, Demerol, Fem Moist andLub(glycerin-hec), hydrocortisone, Infed, Lotrisone, meperidine, Neosporin  (neo-bac-polym), Shellfish Containing Products Family History Unknown History. Social History Never smoker, Marital Status - Widowed, Alcohol Use - Never, Drug Use - No History, Caffeine Use - Rarely. Medical History Hematologic/Lymphatic Patient has history of Anemia Cardiovascular Patient has history of Hypertension, Peripheral Venous Disease - varicose veins Endocrine Denies history of Type I Diabetes, Type II Diabetes Genitourinary Denies history of End Stage Renal Disease Integumentary (Skin) Denies history of History of Burn Musculoskeletal Patient has history of Osteoarthritis Neurologic Patient has history of Neuropathy Denies history of Dementia, Quadriplegia, Paraplegia Psychiatric Denies history of Anorexia/bulimia, Confinement Anxiety Hospitalization/Surgery History - DandC of uterus. - hemicolectomy. - hysteroscopy. - melanoma excision. Medical A Surgical History Notes nd Eyes poor vision Ear/Nose/Mouth/Throat hearing loss Cardiovascular dyslipidemia, mitral valve prolapse Gastrointestinal GERD, incontinence Endocrine hypothyriodism Genitourinary incontinence, indwelling catheter, vaginal bleeding,  fibroid, hemorrhoids Review of Systems (ROS) Constitutional Symptoms (General Health) Denies complaints or symptoms of Fatigue, Fever, Chills, Marked Weight Change. Eyes Complains or has symptoms of Glasses / Contacts. Denies complaints or symptoms of Dry Eyes, Vision Changes. Ear/Nose/Mouth/Throat Denies complaints or symptoms of Chronic sinus problems or rhinitis. Respiratory Complains or has symptoms of Shortness of Breath - with exertion. Denies complaints or symptoms of Chronic or frequent coughs. Cardiovascular Denies complaints or symptoms of Chest pain. Gastrointestinal Denies complaints or symptoms of Frequent diarrhea, Nausea, Vomiting. Genitourinary Denies complaints or symptoms of Frequent urination. Integumentary (Skin) Complains or has  symptoms of Wounds - scalp. Musculoskeletal Complains or has symptoms of Muscle Weakness. Denies complaints or symptoms of Muscle Pain. Neurologic Denies complaints or symptoms of Numbness/parasthesias. Psychiatric Denies complaints or symptoms of Claustrophobia, Suicidal. Objective Constitutional respirations regular, non-labored and within target range for patient.. Vitals Time Taken: 1:17 PM, Height: 62 in, Source: Stated, Source: Stated, Temperature: 98.3 F, Pulse: 76 bpm, Respiratory Rate: 18 breaths/min, Blood Pressure: 133/61 mmHg. Psychiatric pleasant and cooperative. General Notes: Large mass to the parietal region of the head with serosanguineous drainage and odor. Discoloration to the mass. There is gelatinous debris that sloughed off during exam. No obvious signs of infection. Integumentary (Hair, Skin) Wound #1 status is Open. Original cause of wound was Gradually Appeared. The date acquired was: 06/17/2020. The wound is located on the Head - Parietal. The wound measures 12cm length x 15cm width x 0.1cm depth; 141.372cm^2 area and 14.137cm^3 volume. There is Fat Layer (Subcutaneous Tissue) exposed. There is no tunneling or undermining noted. There is a large amount of sanguinous drainage noted. Foul odor after cleansing was noted. The wound margin is indistinct and nonvisible. There is a medium (34-66%) amount of necrotic tissue within the wound bed including Eschar. 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 She is here for palliative wound care. I recommended Dakin's solution, zetuvit and CarboFlex to help with drainage control and odor control. Some of the gelatinous overgrowth was sloughed off  during exam. I wonder if she would benefit from removal of this mass to help with symptoms. I will see her back in 2 weeks. Plan Follow-up Appointments: Return Appointment in 2 weeks. - Dr. Heber [place] Bathing/ Shower/ Hygiene: May shower with protection but do not get wound dressing(s) wet. WOUND #1: - Head - Parietal Wound Laterality: Cleanser: Wound Cleanser 2 x Per Day/15 Days Discharge Instructions: Cleanse the wound with wound cleanser prior to applying a clean dressing using gauze sponges, not tissue or cotton balls. Prim Dressing: Dakin's Solution 0.125%, 16 (oz) 2 x Per Day/15 Days ary Discharge Instructions: Moisten gauze with Dakin's solution Secondary Dressing: Zetuvit Plus 4x8 in 2 x Per Day/15 Days Discharge Instructions: Apply over primary dressing as directed. Secondary Dressing: CarboFLEX Odor Control Dressing, 4x4 in 2 x Per Day/15 Days Discharge Instructions: Apply over primary dressing as directed. Secured With: [employer], 4.5x3.1 (in/yd) 2 x Per Day/15 Days Discharge Instructions: Secure with Kerlix as directed. Secured With: 7M Medipore H Soft Cloth Surgical T 4 x 2 (in/yd) 2 x Per Day/15 Days ape Discharge Instructions: Secure dressing with tape as directed. Secured With: apply netting over dressing to hold onto pt.'s head 2 x Per Day/15 Days 1. Dakin's, CarboFlex and zetuvit 2. follow  up in 2 weeks Electronic Signature(s) Signed: 11/14/2020 9:42:05 AM By: Kalman Shan DO Entered By: Kalman Shan on 11/14/2020 09:40:48 -------------------------------------------------------------------------------- HxROS Details Patient Name: Date of Service: [name] Small, [name] Small. 11/07/2020 1:15 PM Medical Record Number: [id number] Patient Account Number: [id number] Date of Birth/Sex: Treating RN: 1917/06/08 (87 y.o. Female) Baruch Gouty Primary Care Provider: Royal Hawthorn Other Clinician: Referring Provider: Treating Provider/Extender: Dawayne Cirri, CHRISTINA Weeks in Treatment: 0 Constitutional Symptoms (General Health) Complaints and Symptoms: Negative for: Fatigue; Fever; Chills; Marked Weight Change Eyes Complaints and Symptoms: Positive for: Glasses / Contacts Negative for: Dry Eyes; Vision Changes Medical History: Past Medical History Notes: poor vision Ear/Nose/Mouth/Throat Complaints and Symptoms: Negative for: Chronic sinus problems or rhinitis Medical History: Past Medical History Notes: hearing loss Respiratory Complaints and Symptoms: Positive for: Shortness of Breath - with exertion Negative for: Chronic or frequent coughs Cardiovascular Complaints and Symptoms: Negative for: Chest pain Medical History: Positive for: Hypertension; Peripheral Venous Disease - varicose veins Past Medical History Notes: dyslipidemia, mitral valve prolapse Gastrointestinal Complaints and Symptoms: Negative for: Frequent diarrhea; Nausea; Vomiting Medical History: Past Medical History Notes: GERD, incontinence Genitourinary Complaints and Symptoms: Negative for: Frequent urination Medical History: Negative for: End Stage Renal Disease Past Medical History Notes: incontinence, indwelling catheter, vaginal bleeding, fibroid, hemorrhoids Integumentary (Skin) Complaints and Symptoms: Positive for: Wounds - scalp Medical History: Negative for: History of Burn Musculoskeletal Complaints and Symptoms: Positive for: Muscle Weakness Negative for: Muscle Pain Medical History: Positive for: Osteoarthritis Neurologic Complaints and Symptoms: Negative for: Numbness/parasthesias Medical History: Positive for: Neuropathy Negative for: Dementia; Quadriplegia; Paraplegia Psychiatric Complaints and Symptoms: Negative for: Claustrophobia; Suicidal Medical History: Negative for: Anorexia/bulimia; Confinement Anxiety Hematologic/Lymphatic Medical History: Positive for: Anemia Endocrine Medical  History: Negative for: Type I Diabetes; Type II Diabetes Past Medical History Notes: hypothyriodism Immunological Oncologic Immunizations Pneumococcal Vaccine: Received Pneumococcal Vaccination: Yes Implantable Devices No devices added Hospitalization / Surgery History Type of Hospitalization/Surgery DandC of uterus hemicolectomy hysteroscopy melanoma excision Family and Social History Unknown History: Yes; Never smoker; Marital Status - Widowed; Alcohol Use: Never; Drug Use: No History; Caffeine Use: Rarely; Financial Concerns: No; Food, Clothing or Shelter Needs: No; Support System Lacking: No; Transportation Concerns: No Electronic Signature(s) Signed: 11/07/2020 5:08:08 PM By: Baruch Gouty RN, BSN Signed: 11/14/2020 9:42:05 AM By: Kalman Shan DO Entered By: Baruch Gouty on 11/07/2020 14:09:10 -------------------------------------------------------------------------------- SuperBill Details Patient Name: Date of Service: [name] Small, [name] Small. 11/07/2020 Medical Record Number: [id number] Patient Account Number: [id number] Date of Birth/Sex: Treating RN: [birth year] (87 y.o. 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, soft tissue, and skin H35.3230 Exudative age-related macular degeneration, bilateral, stage unspecified H90.3 Sensorineural hearing loss, bilateral F01.50 Vascular dementia without behavioral disturbance Facility Procedures CPT4 Code: [id number] Description: 99214 - WOUND CARE VISIT-LEV 4 EST PT Modifier: Quantity: 1 Physician Procedures : CPT4 Code Description Modifier [id number] WC PHYS LEVEL 3 NEW PT ICD-10 Diagnosis Description D49.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

## 2020-11-21 ENCOUNTER — Encounter (HOSPITAL_BASED_OUTPATIENT_CLINIC_OR_DEPARTMENT_OTHER): Payer: Medicare Other | Admitting: Internal Medicine

## 2020-11-28 ENCOUNTER — Encounter (HOSPITAL_BASED_OUTPATIENT_CLINIC_OR_DEPARTMENT_OTHER): Payer: Medicare Other | Admitting: Internal Medicine

## 2020-11-30 ENCOUNTER — Other Ambulatory Visit: Payer: Self-pay

## 2020-11-30 ENCOUNTER — Non-Acute Institutional Stay (SKILLED_NURSING_FACILITY): Payer: Medicare Other | Admitting: Internal Medicine

## 2020-11-30 DIAGNOSIS — D492 Neoplasm of unspecified behavior of bone, soft tissue, and skin: Secondary | ICD-10-CM

## 2020-11-30 DIAGNOSIS — R339 Retention of urine, unspecified: Secondary | ICD-10-CM

## 2020-11-30 DIAGNOSIS — E039 Hypothyroidism, unspecified: Secondary | ICD-10-CM

## 2020-11-30 DIAGNOSIS — R634 Abnormal weight loss: Secondary | ICD-10-CM

## 2020-11-30 DIAGNOSIS — G609 Hereditary and idiopathic neuropathy, unspecified: Secondary | ICD-10-CM

## 2020-11-30 NOTE — Progress Notes (Addendum)
Location:   [facility] [place] Room Number: 150 Place of Service:  SNF [phone]) Provider:  Veleta Miners MD  Royal Hawthorn, NP  Patient Care Team: Royal Hawthorn, NP as PCP - General (Nurse Practitioner) Terrance Mass, MD (Inactive) as Consulting Physician (Gynecology) Shon Hough, MD as Consulting Physician (Ophthalmology) Carolan Clines, MD (Inactive) as Consulting Physician (Urology) Gatha Mayer, MD as Consulting Physician (Gastroenterology) Wyatt Portela, MD as Consulting Physician (Oncology)  Extended Emergency Contact Information Primary Emergency Contact: Fox,Richard (Dr) Address: [address]          [place], [place] Phone: [phone] Work Phone: [phone] Relation: Alanson Puls Secondary Emergency Contact: Claudell Kyle Address: [place] [address] Johnnette Litter of [place] Mobile Phone: [phone] Relation: Son  Code Status:  DNR Goals of care: Advanced Directive information Advanced Directives 11/30/2020  Does Patient Have a Medical Advance Directive? Yes  Type of Paramedic of [place];Out of facility DNR (pink MOST or yellow form);Living will  Does patient want to make changes to medical advance directive? No - Patient declined  Copy of [place] in Chart? Yes - validated most recent copy scanned in chart (See row information)  Pre-existing out of facility DNR order (yellow form or pink MOST form) Yellow form placed in chart (order not valid for inpatient use);Pink MOST form placed in chart (order not valid for inpatient use)     Chief Complaint  Patient presents with   Medical Management of Chronic Issues   Health Maintenance    Shingrix, #4 covid  (unable to confirm in [facility])    HPI:  Pt is a 87 y.o. female seen today for medical management of chronic diseases.    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 Seeing Wound care for comfort dressing Denied any pain to me Has lost 10 lbs in past few months Walks with mild assist No New Nursing issues   Past Medical History:  Diagnosis Date   Anemia, unspecified    Asymptomatic varicose veins    Carotid bruit    hx of   Carotid bruit    doppler normal in the past   Diverticulosis    Dyslipidemia    Esophageal reflux    Female bladder prolapse, acquired 07/17/2010   Fibroid    Full incontinence of feces    Hemorrhoid    1963 surgical excision   History of colon cancer    Adenocarcinoma of right colon, stage T2, N1.s/p resection/chemotherapy 2002   HTN (hypertension)    Hx of colonic polyps    1965 surgical excision   Hypothyroidism    Idiopathic osteoporosis    Idiopathic osteoporosis    Internal hemorrhoids without mention of complication    bleed easily   Irritable bowel syndrome    Melanoma of skin, site unspecified    MVP (mitral valve prolapse)    OA (osteoarthritis)    left hip   Osteoarthrosis, unspecified whether generalized or localized, pelvic region and thigh    Peripheral neuropathy    hands/ feet   Prolapsed urethral mucosa(599.5)    Spinal stenosis, unspecified region other than cervical    Vaginal bleeding, abnormal  Vaginitis, atrophic    Varicose veins    Past Surgical History:  Procedure Laterality Date   COLONOSCOPY W/ POLYPECTOMY  2006   3 mm polyp destroyed, diverticulosis   DILATION AND CURETTAGE OF UTERUS     ESOPHAGOGASTRODUODENOSCOPY  2009   mild gastritis   HEMICOLECTOMY  2002   right   HEMORRHOID SURGERY  1965   HYSTEROSCOPY     MELANOMA EXCISION      Allergies  Allergen Reactions   Amoxil [Amoxicillin] Diarrhea    Has patient had a PCN reaction causing  immediate rash, facial/tongue/throat swelling, SOB or lightheadedness with hypotension: no Has patient had a PCN reaction causing severe rash involving mucus membranes or skin necrosis: no Has patient had a PCN reaction that required hospitalization : no Has patient had a PCN reaction occurring within the last 10 years: no If all of the above answers are "NO", then may proceed with Cephalosporin use.    Crab [Shellfish Allergy]     sensitivity   Demerol     unknown   Meperidine Hcl Other (See Comments)    Drop in blood pressure   Infed [Iron Dextran] Rash    Became very red over face, scalp, and arms   Neosporin [Neomycin-Bacitracin Zn-Polymyx] Rash    unknown    Allergies as of 11/30/2020       Reactions   Amoxil [amoxicillin] Diarrhea   Has patient had a PCN reaction causing immediate rash, facial/tongue/throat swelling, SOB or lightheadedness with hypotension: no Has patient had a PCN reaction causing severe rash involving mucus membranes or skin necrosis: no Has patient had a PCN reaction that required hospitalization : no Has patient had a PCN reaction occurring within the last 10 years: no If all of the above answers are "NO", then may proceed with Cephalosporin use.   Crab [shellfish Allergy]    sensitivity   Demerol    unknown   Meperidine Hcl Other (See Comments)   Drop in blood pressure   Infed [iron Dextran] Rash   Became very red over face, scalp, and arms   Neosporin [neomycin-bacitracin Zn-polymyx] Rash   unknown        Medication List        Accurate as of November 30, 2020  3:28 PM. If you have any questions, ask your nurse or doctor.          acetaminophen 650 MG CR tablet Commonly known as: TYLENOL Take 1,300 mg by mouth daily.   Align 4 MG Caps Take 1 tablet by mouth daily.   antiseptic oral rinse Liqd by Mouth Rinse route 3 (three) times daily. 2 sprays each time   ELLURA PO Take 36 mg by mouth daily.   gabapentin 100 MG capsule Commonly  known as: NEURONTIN Take 300 mg by mouth at bedtime.   hydrocortisone 2.5 % rectal cream Commonly known as: ANUSOL-HC Place 1 application rectally daily as needed for hemorrhoids or itching.   ketoconazole 2 % shampoo Commonly known as: NIZORAL Apply 1 application topically once a week.   levothyroxine 75 MCG tablet Commonly known as: SYNTHROID TAKE 1 TABLET DAILY BEFORE BREAKFAST FOR THYROID.   lidocaine 2 % jelly Commonly known as: XYLOCAINE 1 application 3 (three) times daily as needed.   melatonin 5 MG Tabs Take by mouth at bedtime as needed.   methenamine 1 g tablet Commonly known as: HIPREX Take 1 g by mouth at bedtime.   mineral oil-hydrophilic petrolatum ointment Apply topically as needed for  dry skin.   morphine 20 MG/ML concentrated solution Commonly known as: ROXANOL Take 0.25 mLs (5 mg total) by mouth every 2 (two) hours as needed for severe pain.   nystatin powder Commonly known as: MYCOSTATIN/NYSTOP Apply topically 2 (two) times daily. Apply to inguinal rash   OcuSoft Eyelid Cleansing Pads Apply topically as needed.   phenazopyridine 200 MG tablet Commonly known as: PYRIDIUM Take 200 mg by mouth every 8 (eight) hours as needed for pain.   Polyethyl Glycol-Propyl Glycol 0.4-0.3 % Soln Place 2 drops into the right eye 2 (two) times daily.   polyethylene glycol 17 g packet Commonly known as: MIRALAX / GLYCOLAX Take 17 g by mouth daily.   sodium fluoride 1.1 % Crea dental cream Commonly known as: PREVIDENT 5000 PLUS Place 1 application onto teeth every evening.   Zeasorb-AF 2 % powder Generic drug: miconazole Apply topically daily. Groin area        Review of Systems Review of Systems  Constitutional: Negative for activity change, appetite change, chills, diaphoresis, fatigue and fever.  HENT: Negative for mouth sores, postnasal drip, rhinorrhea, sinus pain and sore throat.   Respiratory: Negative for apnea, cough, chest tightness,  shortness of breath and wheezing.   Cardiovascular: Negative for chest pain, palpitations and leg swelling.  Gastrointestinal: Negative for abdominal distention, abdominal pain, constipation, diarrhea, nausea and vomiting.  Genitourinary: Negative for dysuria and frequency.  Musculoskeletal: Negative for arthralgias, joint swelling and myalgias.  Skin: Negative for rash.  Neurological: Negative for dizziness, syncope, weakness, light-headedness and numbness.  Psychiatric/Behavioral: Negative for behavioral problems, confusion and sleep disturbance.    Immunization History  Administered Date(s) Administered   H1N1 05/30/2008   Influenza Whole 03/17/2000, 03/05/2010, 03/17/2012   Influenza, High Dose Seasonal PF 04/07/2020   Influenza,inj,Quad PF,6+ Mos 03/14/2015, 04/07/2018, 04/15/2019   Influenza-Unspecified 04/16/2013, 03/31/2014, 04/11/2016, 04/10/2017   Moderna SARS-COV2 Booster Vaccination 05/02/2020   Moderna Sars-Covid-2 Vaccination 06/28/2019, 07/27/2019   Pneumococcal Conjugate-13 09/08/2015   Pneumococcal Polysaccharide-23 03/17/1997   Tdap 10/09/2011   Zoster Recombinat (Shingrix) 07/13/2017   Pertinent  Health Maintenance Due  Topic Date Due   INFLUENZA VACCINE  01/15/2021   DEXA SCAN  Completed   PNA vac Low Risk Adult  Completed   Fall Risk  07/28/2019 02/12/2019 04/08/2018 01/06/2018 11/26/2017  Falls in the past year? - 0 No Yes No  Number falls in past yr: 0 0 - 1 -  Injury with Fall? 0 0 - No -  Risk for fall due to : - Impaired balance/gait - - -  Follow up - Falls evaluation completed - - -   Functional Status Survey:    Vitals:   11/30/20 1517  BP: 102/63  Pulse: 64  Resp: 12  Temp: 97.9 F (36.6 C)  SpO2: 96%  Weight: 100 lb 12.8 oz (45.7 kg)  Height: 4\' 10"  (1.473 m)   Body mass index is 21.07 kg/m. Physical Exam Constitutional: Oriented to person, place, and time. Well-developed and well-nourished.  HENT:  Head: Normocephalic.   Mouth/Throat: Oropharynx is clear and moist.  Eyes: Pupils are equal, round, and reactive to light.  Neck: Neck supple.  Cardiovascular: Normal rate and normal heart sounds.  No murmur heard. Pulmonary/Chest: Effort normal and breath sounds normal. No respiratory distress. No wheezes. She has no rales.  Abdominal: Soft. Bowel sounds are normal. No distension. There is no tenderness. There is no rebound.  Musculoskeletal: No edema.  Lymphadenopathy: none Neurological: Alert and oriented to person, place, and  time. 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 Behavior is normal. Thought content normal.   Labs reviewed: Recent Labs    01/16/20 0000  NA 137  K 4.3  CL 98*  CO2 25*  BUN 17  CREATININE 0.6  CALCIUM 9.2   No results for input(s): AST, ALT, ALKPHOS, BILITOT, PROT, ALBUMIN in the last 8760 hours. No results for input(s): WBC, NEUTROABS, HGB, HCT, MCV, PLT in the last 8760 hours. Lab Results  Component Value Date   TSH 0.19 (A) 10/23/2020   Lab Results  Component Value Date   HGBA1C 5.9 12/02/2006   Lab Results  Component Value Date   CHOL 174 11/07/2015   HDL 79 (A) 11/07/2015   LDLCALC 78 11/07/2015   LDLDIRECT 123.7 12/08/2007   TRIG 87 11/07/2015   CHOLHDL 3 07/16/2011    Significant Diagnostic Results in last 30 days:  No results found.  Assessment/Plan  Neoplasm of scalp Dressing per Wound care I was unable to see the wound today as Nurse had just changed the dressing Will plan to see with next visit Urinary retention Has Chronic Foley Also on Hiprex and Pyridium Weight loss Supportive care Reducing dose of Synthroid will help  Hypothyroidism, unspecified type TSH is low Synthroid changed to 62.5 Repeat TSH in 8 weeks Hereditary and idiopathic peripheral neuropathy On Neurontin  Family/ staff Communication:   Labs/tests ordered:  TSH in 8 weeks

## 2020-12-05 ENCOUNTER — Encounter (HOSPITAL_BASED_OUTPATIENT_CLINIC_OR_DEPARTMENT_OTHER): Payer: Medicare Other | Attending: Internal Medicine | Admitting: Internal Medicine

## 2020-12-05 ENCOUNTER — Other Ambulatory Visit: Payer: Self-pay

## 2020-12-05 DIAGNOSIS — H903 Sensorineural hearing loss, bilateral: Secondary | ICD-10-CM

## 2020-12-05 DIAGNOSIS — S0190XA Unspecified open wound of unspecified part of head, initial encounter: Secondary | ICD-10-CM | POA: Diagnosis not present

## 2020-12-05 DIAGNOSIS — G629 Polyneuropathy, unspecified: Secondary | ICD-10-CM | POA: Diagnosis present

## 2020-12-05 DIAGNOSIS — H353230 Exudative age-related macular degeneration, bilateral, stage unspecified: Secondary | ICD-10-CM

## 2020-12-05 DIAGNOSIS — F0150 Vascular dementia without behavioral disturbance: Secondary | ICD-10-CM | POA: Diagnosis not present

## 2020-12-05 DIAGNOSIS — I10 Essential (primary) hypertension: Secondary | ICD-10-CM | POA: Insufficient documentation

## 2020-12-05 DIAGNOSIS — D492 Neoplasm of unspecified behavior of bone, soft tissue, and skin: Secondary | ICD-10-CM | POA: Insufficient documentation

## 2020-12-05 NOTE — Progress Notes (Addendum)
[name] Small, [name] Small ([id number]) Visit Report for 12/05/2020 Chief Complaint Document Details Patient Name: Date of Service: Fritzi Mandes RA H L. 12/05/2020 2:00 PM Medical Record Number: [id number] Patient Account Number: [id number] Date of Birth/Sex: Treating RN: March 27, 1917 (87 y.o. Tonita Phoenix, Lauren Primary Care Provider: Royal Hawthorn Other Clinician: Referring Provider: Treating Provider/Extender: Juanita Laster Weeks in Treatment: 4 Information Obtained from: Patient Chief Complaint Mass to the top of the head that drains serosanguinous fluid and has odor Electronic Signature(s) Signed: 12/05/2020 3:13:03 PM By: Kalman Shan DO Entered By: Kalman Shan on 12/05/2020 15:07:26 -------------------------------------------------------------------------------- HPI Details Patient Name: Date of Service: [name] Small, DEBO RA H L. 12/05/2020 2:00 PM Medical Record Number: [id number] Patient Account Number: [id number] Date of Birth/Sex: Treating RN: [birth year] (87 y.o. Benjaman Lobe Primary Care Provider: Royal Hawthorn Other Clinician: Referring Provider: Treating Provider/Extender: Juanita Laster Weeks in Treatment: 4 History of Present Illness HPI Description: Admission 5/24 Ms. Kirston Luty is 87 year old female with a past medical history of dementia, and sensorineural hearing loss of both ears and bilateral macular degeneration that presents to our clinic for palliative wound care of a lesion to her scalp. 3 months ago patient was seen by dermatology for a scalp lesion and it was deemed likely a neoplasm. No biopsy was taken as family would not want to treat this. Over the past 3 months she has had difficulty with controlling drainage and odor to the mass. She has a hard time participating in a conversation due to hearing loss. Currently she denies pain or signs of infection. 6/21; patient presents for 1 month  follow-up. She has been using for the facility notes [facility] wet-to-dry with CarboFlex and sit to visit. She has no questions or concerns today. Electronic Signature(s) Signed: 12/05/2020 3:13:03 PM By: Kalman Shan DO Entered By: Kalman Shan on 12/05/2020 15:07:58 -------------------------------------------------------------------------------- Physical Exam Details Patient Name: Date of Service: [name] Small, DEBO RA H L. 12/05/2020 2:00 PM Medical Record Number: [id number] Patient Account Number: [id number] Date of Birth/Sex: Treating RN: 11-02-1916 (87 y.o. Benjaman Lobe Primary Care Provider: Other Clinician: Royal Hawthorn Referring Provider: Treating Provider/Extender: Juanita Laster Weeks in Treatment: 4 Constitutional respirations regular, non-labored and within target range for patient.Marland Kitchen Psychiatric pleasant and cooperative. Notes Large mass to the parietal region of the head with serosanguineous drainage and odor. Discoloration to the mass. No obvious signs of infection. Electronic Signature(s) Signed: 12/05/2020 3:13:03 PM By: Kalman Shan DO Entered By: Kalman Shan on 12/05/2020 15:08:37 -------------------------------------------------------------------------------- Physician Orders Details Patient Name: Date of Service: [name] Small, DEBO RA H L. 12/05/2020 2:00 PM Medical Record Number: [id number] Patient Account Number: [id number] Date of Birth/Sex: Treating RN: 1916/11/27 (87 y.o. 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 Heber [place] Return A Bathing/ Shower/ Hygiene May shower with protection but do not get wound dressing(s) wet. Wound Treatment Wound #1 - Head - Parietal Cleanser: Wound Cleanser 2 x Per LYY/50 Days Discharge Instructions: Cleanse the wound with wound cleanser prior to applying a clean dressing using gauze sponges, not tissue or cotton balls. Prim Dressing: Dakin's Solution 0.125%, 16 (oz) 2  x Per Day/15 Days ary Discharge Instructions: Moisten gauze with Dakin's solution Secondary Dressing: ABD Pad, 5x9 2 x Per Day/15 Days Discharge Instructions: Apply over primary dressing as directed. Secondary Dressing: Zetuvit Plus 4x8 in 2 x Per Day/15 Days Discharge Instructions: Apply over primary dressing as directed. Secured With: [employer], 4.5x3.1 (in/yd) 2 x Per Day/15 Days Discharge Instructions: Secure with Kerlix as directed. Secured With: 74M Medipore H Soft Cloth Surgical T 4 x 2 (in/yd) 2 x Per Day/15 Days ape Discharge Instructions: Secure dressing with tape as directed. Secured With: apply netting over dressing to hold onto pt.'s head 2 x Per KZS/01 Days Electronic Signature(s) Signed: 12/05/2020 3:13:03 PM By: Kalman Shan DO Entered By: Kalman Shan on 12/05/2020 15:08:50 -------------------------------------------------------------------------------- Problem List Details Patient Name: Date of Service: [name] Small, DEBO RA H L. 12/05/2020 2:00 PM Medical Record Number: [id number] Patient Account Number: [id number] Date of Birth/Sex: Treating RN: 11-03-1916 (87 y.o. Nita Sells Primary Care Provider: Royal Hawthorn Other Clinician: Referring Provider: Treating Provider/Extender: Juanita Laster Weeks in Treatment: 4 Active Problems ICD-10 Encounter Code Description Active Date MDM Diagnosis D49.2 Neoplasm of  unspecified behavior of bone, soft tissue, and skin 11/07/2020 No Yes H35.3230 Exudative age-related macular degeneration, bilateral, stage unspecified 11/14/2020 No Yes H90.3 Sensorineural hearing loss, bilateral 11/14/2020 No Yes F01.50 Vascular dementia without behavioral disturbance 11/14/2020 No Yes Inactive Problems Resolved Problems Electronic Signature(s) Signed: 12/05/2020 3:13:03 PM By: Kalman Shan DO Entered By: Kalman Shan on 12/05/2020 15:06:59 -------------------------------------------------------------------------------- Progress Note Details Patient Name: Date of Service: [name] Small, DEBO RA H L. 12/05/2020 2:00 PM Medical Record Number: [id number] Patient Account Number: [id number] Date of Birth/Sex: Treating RN: 07-23-1916 (87 y.o. Tonita Phoenix, Lauren Primary Care Provider: Royal Hawthorn Other Clinician: Referring Provider: Treating Provider/Extender: Juanita Laster Weeks in Treatment: 4 Subjective Chief Complaint Information obtained from Patient Mass to the top of the head that drains serosanguinous fluid and has odor History of Present Illness (HPI) Admission 5/24 Ms. Rylynn Kobs is 87 year old female with a past medical history of dementia, and sensorineural hearing loss of both ears and bilateral macular degeneration that presents to our clinic for palliative wound care of a lesion to her scalp. 3 months ago patient was seen by dermatology for a scalp lesion and it was deemed likely a neoplasm. No biopsy was taken as family would not want to treat this. Over the past 3 months she has had difficulty with controlling drainage and odor to the mass. She has a hard time participating in a conversation due to hearing loss. Currently she denies pain or signs of infection. 6/21; patient presents for 1 month follow-up. She has been using for the facility notes [facility] wet-to-dry with CarboFlex and sit to visit. She has no questions or  concerns today. Patient History Information obtained from Patient. Family History Unknown History. Social History Never smoker, Marital Status - Widowed, Alcohol Use - Never, Drug Use - No History, Caffeine Use - Rarely. Medical History Hematologic/Lymphatic Patient has history of Anemia Cardiovascular Patient has history of Hypertension, Peripheral Venous Disease - varicose veins Endocrine Denies history of Type I Diabetes, Type II Diabetes Genitourinary Denies history of End Stage Renal Disease Integumentary (Skin) Denies history of History of Burn Musculoskeletal Patient has history of Osteoarthritis Neurologic Patient has history of Neuropathy Denies history of Dementia, Quadriplegia, Paraplegia Psychiatric Denies history of Anorexia/bulimia, Confinement Anxiety Hospitalization/Surgery History - DandC of uterus. - hemicolectomy. - hysteroscopy. - melanoma excision. Medical A Surgical History  Notes nd Eyes poor vision Ear/Nose/Mouth/Throat hearing loss Cardiovascular dyslipidemia, mitral valve prolapse Gastrointestinal GERD, incontinence Endocrine hypothyriodism Genitourinary incontinence, indwelling catheter, vaginal bleeding, fibroid, hemorrhoids Objective Constitutional respirations regular, non-labored and within target range for patient.. Vitals Time Taken: 2:20 PM, Height: 62 in, Temperature: 98.6 F, Pulse: 71 bpm, Respiratory Rate: 24 breaths/min, Blood Pressure: 116/64 mmHg. Psychiatric pleasant and cooperative. General Notes: Large mass to the parietal region of the head with serosanguineous drainage and odor. Discoloration to the mass. No obvious signs of infection. Integumentary (Hair, Skin) Wound #1 status is Open. Original cause of wound was Gradually Appeared. The date acquired was: 06/17/2020. The wound has been in treatment 4 weeks. The wound is located on the Head - Parietal. The wound measures 12cm length x 16cm width x 0.1cm depth; 150.796cm^2  area and 15.08cm^3 volume. There is Fat Layer (Subcutaneous Tissue) exposed. There is no tunneling or undermining noted. There is a large amount of serosanguineous drainage noted. Foul odor after cleansing was noted. The wound margin is indistinct and nonvisible. There is a medium (34-66%) amount of necrotic tissue within the wound bed including Eschar. Assessment Active Problems ICD-10 Neoplasm of unspecified behavior of bone, soft tissue, and skin Exudative age-related macular degeneration, bilateral, stage unspecified Sensorineural hearing loss, bilateral Vascular dementia without behavioral disturbance Patient has a large mass to her head and is following for odor and drainage control. There are no plans to remove the mass. There is less drainage and odor on exam. I recommended continuing with [facility] wet-to-dry, CarboFlex and zetuvit. I will see her back in 6 weeks Plan Follow-up Appointments: Return Appointment in: - 6 weeks Dr. Heber [place] Bathing/ Shower/ Hygiene: May shower with protection but do not get wound dressing(s) wet. WOUND #1: - Head - Parietal Wound Laterality: Cleanser: Wound Cleanser 2 x Per Day/15 Days Discharge Instructions: Cleanse the wound with wound cleanser prior to applying a clean dressing using gauze sponges, not tissue or cotton balls. Prim Dressing: Dakin's Solution 0.125%, 16 (oz) 2 x Per Day/15 Days ary Discharge Instructions: Moisten gauze with Dakin's solution Secondary Dressing: ABD Pad, 5x9 2 x Per Day/15 Days Discharge Instructions: Apply over primary dressing as directed. Secondary Dressing: Zetuvit Plus 4x8 in 2 x Per Day/15 Days Discharge Instructions: Apply over primary dressing as directed. Secured With: [employer], 4.5x3.1 (in/yd) 2 x Per Day/15 Days Discharge Instructions: Secure with Kerlix as directed. Secured With: 55M Medipore H Soft Cloth Surgical T 4 x 2 (in/yd) 2 x Per Day/15 Days ape Discharge Instructions: Secure dressing  with tape as directed. Secured With: apply netting over dressing to hold onto pt.'s head 2 x Per Day/15 Days 1. [facility] wet to dry, zetuvit and carboflex 2. follow up in 6 weeks Electronic Signature(s) Signed: 12/05/2020 3:13:03 PM By: Kalman Shan DO Entered By: Kalman Shan on 12/05/2020 15:12:26 -------------------------------------------------------------------------------- HxROS Details Patient Name: Date of Service: [name] Small, DEBO RA H L. 12/05/2020 2:00 PM Medical Record Number: [id number] Patient Account Number: [id number] Date of Birth/Sex: Treating RN: 09-05-1916 (87 y.o. Tonita Phoenix, Lauren Primary Care Provider: Royal Hawthorn Other Clinician: Referring Provider: Treating Provider/Extender: Wonda Amis in Treatment: 4 Information Obtained From Patient Eyes Medical History: Past Medical History Notes: poor vision Ear/Nose/Mouth/Throat Medical History: Past Medical History Notes: hearing loss Hematologic/Lymphatic Medical History: Positive for: Anemia Cardiovascular Medical History: Positive for: Hypertension; Peripheral Venous Disease - varicose veins Past Medical History Notes: dyslipidemia, mitral valve prolapse Gastrointestinal Medical History: Past Medical History Notes: GERD, incontinence Endocrine Medical  History: Negative for: Type I Diabetes; Type II Diabetes Past Medical History Notes: hypothyriodism Genitourinary Medical History: Negative for: End Stage Renal Disease Past Medical History Notes: incontinence, indwelling catheter, vaginal bleeding, fibroid, hemorrhoids Integumentary (Skin) Medical History: Negative for: History of Burn Musculoskeletal Medical History: Positive for: Osteoarthritis Neurologic Medical History: Positive for: Neuropathy Negative for: Dementia; Quadriplegia; Paraplegia Psychiatric Medical History: Negative for: Anorexia/bulimia; Confinement  Anxiety Immunizations Pneumococcal Vaccine: Received Pneumococcal Vaccination: Yes Implantable Devices No devices added Hospitalization / Surgery History Type of Hospitalization/Surgery DandC of uterus hemicolectomy hysteroscopy melanoma excision Family and Social History Unknown History: Yes; Never smoker; Marital Status - Widowed; Alcohol Use: Never; Drug Use: No History; Caffeine Use: Rarely; Financial Concerns: No; Food, Clothing or Shelter Needs: No; Support System Lacking: No; Transportation Concerns: No Electronic Signature(s) Signed: 12/05/2020 3:13:03 PM By: Kalman Shan DO Signed: 12/05/2020 5:26:08 PM By: Rhae Hammock RN Entered By: Kalman Shan on 12/05/2020 15:08:04 -------------------------------------------------------------------------------- SuperBill Details Patient Name: Date of Service: [name] Small, DEBO RA H L. 12/05/2020 Medical Record Number: [id number] Patient Account Number: [id number] Date of Birth/Sex: Treating RN: 16-Apr-1917 (87 y.o. 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

## 2020-12-07 NOTE — Progress Notes (Addendum)
[name], Small ([id number]) Visit Report for 12/05/2020 Arrival Information Details Patient Name: Date of Service: [name] Small RA Nelva Bush 12/05/2020 2:00 PM Medical Record Number: [id number] Patient Account Number: [id number] Date of Birth/Sex: Treating RN: [birth year] (87 y.o. [name] Small Primary Care Titan Karner: Royal Hawthorn Other Clinician: Referring Joseantonio Dittmar: Treating Aliea Bobe/Extender: Wonda Amis in Treatment: 4 Visit Information History Since Last Visit All ordered tests and consults were completed: No Patient Arrived: Wheel Chair Added or deleted any medications: No Arrival Time: 14:26 Any new allergies or adverse reactions: No Accompanied By: sitter from facility Had a fall or experienced change in No Transfer Assistance: None activities of daily living that may affect Patient Identification Verified: Yes risk of falls: Secondary Verification Process Completed: Yes Signs or symptoms of abuse/neglect since last visito No Patient Requires Transmission-Based Precautions: No Hospitalized since last visit: No Patient Has Alerts: No Implantable device outside of the clinic excluding No cellular tissue based products placed in the center since last visit: Pain Present Now: No Electronic Signature(s) Signed: 12/07/2020 8:38:46 AM By: Leane Call Entered By: Leane Call on 12/05/2020 14:26:38 -------------------------------------------------------------------------------- Clinic Level of Care Assessment Details Patient Name: Date of Service: [name] Small RA H L. 12/05/2020 2:00 PM Medical Record Number: [id number] Patient Account Number: [id number] Date of Birth/Sex: Treating RN: Feb 13, 1917 (87 y.o. [name] Small, Tammi Klippel Primary Care Ahijah Devery: Royal Hawthorn Other Clinician: Referring Kairen Hallinan: Treating Jaide Hillenburg/Extender: Wonda Amis in Treatment: 4 Clinic Level of Care Assessment Items TOOL 4  Quantity Score X- 1 0 Use when only an EandM is performed on FOLLOW-UP visit ASSESSMENTS - Nursing Assessment / Reassessment X- 1 10 Reassessment of Co-morbidities (includes updates in patient status) X- 1 5 Reassessment of Adherence to Treatment Plan ASSESSMENTS - Wound and Skin A ssessment / Reassessment X - Simple Wound Assessment / Reassessment - one wound 1 5 []  - 0 Complex Wound Assessment / Reassessment - multiple wounds X- 1 10 Dermatologic / Skin Assessment (not related to wound area) ASSESSMENTS - Focused Assessment []  - 0 Circumferential Edema Measurements - multi extremities []  - 0 Nutritional Assessment / Counseling / Intervention []  - 0 Lower Extremity Assessment (monofilament, tuning fork, pulses) []  - 0 Peripheral Arterial Disease Assessment (using hand held doppler) ASSESSMENTS - Ostomy and/or Continence Assessment and Care []  - 0 Incontinence Assessment and Management []  - 0 Ostomy Care Assessment and Management (repouching, etc.) PROCESS - Coordination of Care X - Simple Patient / Family Education for ongoing care 1 15 []  - 0 Complex (extensive) Patient / Family Education for ongoing care X- 1 10 Staff obtains Programmer, systems, Records, T Results / Process Orders est []  - 0 Staff telephones HHA, Nursing Homes / Clarify orders / etc []  - 0 Routine Transfer to another Facility (non-emergent condition) []  - 0 Routine Hospital Admission (non-emergent condition) []  - 0 New Admissions / Biomedical engineer / Ordering NPWT Apligraf, etc. , []  - 0 Emergency Hospital Admission (emergent condition) X- 1 10 Simple Discharge Coordination []  - 0 Complex (extensive) Discharge Coordination PROCESS - Special Needs []  - 0 Pediatric / Minor Patient Management []  - 0 Isolation Patient Management []  - 0 Hearing / Language / Visual special needs []  - 0 Assessment of Community assistance (transportation, D/C planning, etc.) []  - 0 Additional assistance / Altered  mentation []  - 0 Support Surface(s) Assessment (bed, cushion, seat, etc.) INTERVENTIONS - Wound Cleansing / Measurement X - Simple Wound Cleansing - one wound 1 5 []  - 0  Solution 0.125%, 16 (oz) Discharge Instruction: Moisten gauze with Dakin's solution Secondary Dressing ABD Pad, 5x9 Discharge Instruction: Apply over primary dressing as directed. Zetuvit Plus 4x8  in Discharge Instruction: Apply over primary dressing as directed. Secured With [employer], 4.5x3.1 (in/yd) Discharge Instruction: Secure with Kerlix as directed. 25M Medipore H Soft Cloth Surgical T 4 x 2 (in/yd) ape Discharge Instruction: Secure dressing with tape as directed. apply netting over dressing to hold onto pt.'s head Compression Wrap Compression Stockings Add-Ons Electronic Signature(s) Signed: 12/05/2020 4:19:49 PM By: Sandre Kitty Signed: 12/07/2020 8:38:46 AM By: Leane Call Entered By: Sandre Kitty on 12/05/2020 16:09:54 -------------------------------------------------------------------------------- Vitals Details Patient Name: Date of Service: [name] Small, DEBO RA H L. 12/05/2020 2:00 PM Medical Record Number: [id number] Patient Account Number: [id number] Date of Birth/Sex: Treating RN: 06-Dec-1916 (87 y.o. [name] Small Primary Care Saraiah Bhat: Royal Hawthorn Other Clinician: Referring Samari Bittinger: Treating Quynn Vilchis/Extender: Juanita Laster Weeks in Treatment: 4 Vital Signs Time Taken: 14:20 Temperature (F): 98.6 Height (in): 62 Pulse (bpm): 71 Respiratory Rate (breaths/min): 24 Blood Pressure (mmHg): 116/64 Reference Range: 80 - 120 mg / dl Electronic Signature(s) Signed: 12/07/2020 8:38:46 AM By: Leane Call Entered By: Leane Call on 12/05/2020 14:27:23  Complex Wound Cleansing - multiple wounds X- 1 5 Wound Imaging (photographs - any number of wounds) []  - 0 Wound Tracing (instead of photographs) X- 1 5 Simple Wound Measurement - one wound []  - 0 Complex Wound Measurement - multiple wounds INTERVENTIONS - Wound Dressings []  - 0 Small Wound Dressing one or multiple wounds X- 1 15 Medium Wound Dressing one or multiple wounds []  - 0 Large Wound Dressing one or multiple wounds []  - 0 Application of Medications - topical []  - 0 Application of Medications - injection INTERVENTIONS - Miscellaneous []  - 0 External ear exam []  - 0 Specimen Collection (cultures, biopsies, blood, body fluids, etc.) []  - 0 Specimen(s) / Culture(s) sent or taken to Lab for analysis []  - 0 Patient Transfer (multiple staff / Civil Service fast streamer / Similar devices) []  - 0 Simple Staple / Suture removal (25 or less) []  - 0 Complex Staple / Suture removal (26 or more) []  - 0 Hypo / Hyperglycemic Management (close monitor of Blood Glucose) []  - 0 Ankle / Brachial Index (ABI) - do not check if billed separately X- 1 5 Vital Signs Has the patient been seen at the hospital within the last three years: Yes Total Score: 100 Level Of Care: New/Established - Level 3 Electronic Signature(s) Signed: 12/05/2020 4:54:56 PM By: Deon Pilling Entered By: Deon Pilling on 12/05/2020 15:09:54 -------------------------------------------------------------------------------- Encounter Discharge Information Details Patient Name: Date of Service: [name] Small, DEBO RA H L. 12/05/2020 2:00 PM Medical Record Number: [id number] Patient Account Number: [id number] Date of Birth/Sex: Treating RN: 1917-04-25 (87 y.o. [name] Small Primary Care Rolla Kedzierski: Royal Hawthorn Other Clinician: Referring  Lawanna Cecere: Treating Gurshaan Matsuoka/Extender: Wonda Amis in Treatment: 4 Encounter Discharge Information Items Discharge Condition: Stable Ambulatory Status: Ambulatory Discharge Destination: Home Transportation: Private Auto Accompanied By: sitter from SNF Schedule Follow-up Appointment: No Clinical Summary of Care: Provided Form Type Recipient Paper Patient SNF employee Electronic Signature(s) Signed: 12/07/2020 8:38:46 AM By: Leane Call Entered By: Leane Call on 12/05/2020 16:43:33 -------------------------------------------------------------------------------- Lower Extremity Assessment Details Patient Name: Date of Service: [name] Small RA H L. 12/05/2020 2:00 PM Medical Record Number: [id number] Patient Account Number: [id number] Date of Birth/Sex: Treating RN: [birth year] (87 y.o. [name] Small Primary Care Anvith Mauriello: Royal Hawthorn Other Clinician: Referring Rochella Benner: Treating Cathern Tahir/Extender: Juanita Laster Weeks in Treatment: 4 Electronic Signature(s) Signed: 12/07/2020 8:38:46 AM By: Leane Call Entered By: Leane Call on 12/05/2020 14:28:12 -------------------------------------------------------------------------------- Multi Wound Chart Details Patient Name: Date of Service: [name] Small, DEBO RA H L. 12/05/2020 2:00 PM Medical Record Number: [id number] Patient Account Number: [id number] Date of Birth/Sex: Treating RN: [birth year] (87 y.o. [name] Small, [name] Small Primary Care Daelon Dunivan: Royal Hawthorn Other Clinician: Referring Merlen Gurry: Treating Refugio Vandevoorde/Extender: Juanita Laster Weeks in Treatment: 4 Vital Signs Height(in): 62 Pulse(bpm): 71 Weight(lbs): Blood Pressure(mmHg): 116/64 Body Mass Index(BMI): Temperature(F): 98.6 Respiratory Rate(breaths/min): 24 Photos: [1:No Photos Head - Parietal] [N/A:N/A N/A] Wound Location: [1:Gradually Appeared] [N/A:N/A] Wounding  Event: [1:Malignant Wound] [N/A:N/A] Primary Etiology: [1:Anemia, Hypertension, Peripheral] [N/A:N/A] Comorbid History: [1:Venous Disease, Osteoarthritis, Neuropathy 06/17/2020] [N/A:N/A] Date Acquired: [1:4] [N/A:N/A] Weeks of Treatment: [1:Open] [N/A:N/A] Wound Status: [1:12x16x0.1] [N/A:N/A] Measurements L x W x D (cm) [1:150.796] [N/A:N/A] A (cm) : rea [1:15.08] [N/A:N/A] Volume (cm) : [1:-6.70%] [N/A:N/A] % Reduction in Area: [1:-6.70%] [N/A:N/A] % Reduction in Volume: [1:Full Thickness Without Exposed] [N/A:N/A] Classification: [1:Support Structures Large] [N/A:N/A] Exudate A mount: [1:Serosanguineous] [N/A:N/A] Exudate Type: [1:red, brown] [N/A:N/A] Exudate Color: [1:Yes] [N/A:N/A] Foul Odor A Cleansing: [1:fter No] [N/A:N/A] Odor  Solution 0.125%, 16 (oz) Discharge Instruction: Moisten gauze with Dakin's solution Secondary Dressing ABD Pad, 5x9 Discharge Instruction: Apply over primary dressing as directed. Zetuvit Plus 4x8  in Discharge Instruction: Apply over primary dressing as directed. Secured With [employer], 4.5x3.1 (in/yd) Discharge Instruction: Secure with Kerlix as directed. 25M Medipore H Soft Cloth Surgical T 4 x 2 (in/yd) ape Discharge Instruction: Secure dressing with tape as directed. apply netting over dressing to hold onto pt.'s head Compression Wrap Compression Stockings Add-Ons Electronic Signature(s) Signed: 12/05/2020 4:19:49 PM By: Sandre Kitty Signed: 12/07/2020 8:38:46 AM By: Leane Call Entered By: Sandre Kitty on 12/05/2020 16:09:54 -------------------------------------------------------------------------------- Vitals Details Patient Name: Date of Service: [name] Small, DEBO RA H L. 12/05/2020 2:00 PM Medical Record Number: [id number] Patient Account Number: [id number] Date of Birth/Sex: Treating RN: 06-Dec-1916 (87 y.o. [name] Small Primary Care Saraiah Bhat: Royal Hawthorn Other Clinician: Referring Samari Bittinger: Treating Quynn Vilchis/Extender: Juanita Laster Weeks in Treatment: 4 Vital Signs Time Taken: 14:20 Temperature (F): 98.6 Height (in): 62 Pulse (bpm): 71 Respiratory Rate (breaths/min): 24 Blood Pressure (mmHg): 116/64 Reference Range: 80 - 120 mg / dl Electronic Signature(s) Signed: 12/07/2020 8:38:46 AM By: Leane Call Entered By: Leane Call on 12/05/2020 14:27:23

## 2020-12-11 ENCOUNTER — Encounter: Payer: Self-pay | Admitting: Adult Health

## 2020-12-11 ENCOUNTER — Non-Acute Institutional Stay (SKILLED_NURSING_FACILITY): Payer: Medicare Other | Admitting: Adult Health

## 2020-12-11 DIAGNOSIS — D492 Neoplasm of unspecified behavior of bone, soft tissue, and skin: Secondary | ICD-10-CM | POA: Diagnosis not present

## 2020-12-11 DIAGNOSIS — R339 Retention of urine, unspecified: Secondary | ICD-10-CM | POA: Diagnosis not present

## 2020-12-11 DIAGNOSIS — I639 Cerebral infarction, unspecified: Secondary | ICD-10-CM | POA: Diagnosis not present

## 2020-12-11 DIAGNOSIS — R5383 Other fatigue: Secondary | ICD-10-CM

## 2020-12-11 NOTE — Progress Notes (Signed)
Location:  [facility] Room Number: 150-A Place of Service:  SNF [phone]) Provider:  Royal Hawthorn, NP    Patient Care Team: Royal Hawthorn, NP as PCP - General (Nurse Practitioner) Terrance Mass, MD (Inactive) as Consulting Physician (Gynecology) Shon Hough, MD as Consulting Physician (Ophthalmology) Carolan Clines, MD (Inactive) as Consulting Physician (Urology) Gatha Mayer, MD as Consulting Physician (Gastroenterology) Wyatt Portela, MD as Consulting Physician (Oncology)  Extended Emergency Contact Information Primary Emergency Contact: Fox,Richard (Dr) Address: [address]          [place], [place] Phone: [phone] Work Phone: [phone] Relation: Alanson Puls Secondary Emergency Contact: Claudell Kyle Address: [place] [address] Johnnette Litter of [place] Mobile Phone: [phone] Relation: Son  Code Status:  DNR Goals of care: Advanced Directive information Advanced Directives 12/11/2020  Does Patient Have a Medical Advance Directive? Yes  Type of Paramedic of [place];Living will;Out of facility DNR (pink MOST or yellow form)  Does patient want to make changes to medical advance directive? No - Patient declined  Copy of [place] in Chart? Yes - validated most recent copy scanned in chart (See row information)  Pre-existing out of facility DNR order (yellow form or pink MOST form) Yellow form placed in chart (order not valid for inpatient use);Pink MOST form placed in chart (order not valid for inpatient use)     Chief Complaint  Patient presents with   Acute Visit    Lethargic     HPI:  Pt is a 87 y.o. female seen today for an acute visit for lethargy PMH significant for scalp neoplasm, HOH, decreased vision, foot drop, dementia, urinary retention with bladder/urethra prolaspe, anemia, htn, among others.   The nurse reports on 6/26 they noticed increased lethargy, moaning and a right facial droop. She is not answering questions and can not follow commands but does seem to be aware in that she is squeezing the nurses hands tightly and did wake up and swallow pills this morning (6/27).  She has a rapidly growing neoplasm to the scalp that is bleeding and painful. She did not want this worked up or removed due to her goals of care. The staff is now noticing an enlarged vein at the left temple going around to the left orbit. Also mottling is noted to the knees and hand. She is not eating. DNR in place with no hospitalizations.    Past Medical History:  Diagnosis Date   Anemia, unspecified    Asymptomatic varicose veins    Carotid bruit    hx of   Carotid bruit    doppler normal in the past   Diverticulosis    Dyslipidemia    Esophageal reflux    Female bladder prolapse, acquired 07/17/2010   Fibroid    Full incontinence of feces    Hemorrhoid    1963 surgical excision   History of colon cancer    Adenocarcinoma of right colon, stage T2, N1.s/p resection/chemotherapy 2002   HTN (hypertension)    Hx of colonic polyps    1965 surgical excision   Hypothyroidism    Idiopathic osteoporosis    Idiopathic osteoporosis    Internal hemorrhoids without mention of complication    bleed easily   Irritable bowel syndrome    Melanoma of skin, site unspecified    MVP (mitral valve prolapse)    OA (  osteoarthritis)    left hip   Osteoarthrosis, unspecified whether generalized or localized, pelvic region and thigh    Peripheral neuropathy    hands/ feet   Prolapsed urethral mucosa(599.5)    Spinal stenosis, unspecified region other than cervical    Vaginal bleeding, abnormal    Vaginitis, atrophic    Varicose veins    Past Surgical History:  Procedure Laterality Date   COLONOSCOPY W/ POLYPECTOMY  2006   3 mm polyp destroyed, diverticulosis   DILATION AND CURETTAGE OF UTERUS      ESOPHAGOGASTRODUODENOSCOPY  2009   mild gastritis   HEMICOLECTOMY  2002   right   HEMORRHOID SURGERY  1965   HYSTEROSCOPY     MELANOMA EXCISION      Allergies  Allergen Reactions   Amoxil [Amoxicillin] Diarrhea    Has patient had a PCN reaction causing immediate rash, facial/tongue/throat swelling, SOB or lightheadedness with hypotension: no Has patient had a PCN reaction causing severe rash involving mucus membranes or skin necrosis: no Has patient had a PCN reaction that required hospitalization : no Has patient had a PCN reaction occurring within the last 10 years: no If all of the above answers are "NO", then may proceed with Cephalosporin use.    Crab [Shellfish Allergy]     sensitivity   Demerol     unknown   Meperidine Hcl Other (See Comments)    Drop in blood pressure   Infed [Iron Dextran] Rash    Became very red over face, scalp, and arms   Neosporin [Neomycin-Bacitracin Zn-Polymyx] Rash    unknown    Outpatient Encounter Medications as of 12/11/2020  Medication Sig   bisacodyl (DULCOLAX) 10 MG suppository Place 10 mg rectally as needed for moderate constipation.   Eyelid Cleansers (OCUSOFT EYELID CLEANSING) PADS Apply topically as needed.   hydrocortisone (ANUSOL-HC) 2.5 % rectal cream Place 1 application rectally daily as needed for hemorrhoids or itching.    lidocaine (XYLOCAINE) 2 % jelly 1 application 3 (three) times daily as needed.   LORazepam (ATIVAN) 2 MG/ML concentrated solution Take 0.5 mg by mouth every 4 (four) hours as needed for anxiety.   miconazole (ZEASORB-AF) 2 % powder Apply topically daily. Groin area   mineral oil-hydrophilic petrolatum (AQUAPHOR) ointment Apply topically as needed for dry skin.   morphine (ROXANOL) 20 MG/ML concentrated solution Take 0.25 mLs (5 mg total) by mouth every 2 (two) hours as needed for severe pain.   nystatin (MYCOSTATIN/NYSTOP) powder Apply topically 2 (two) times daily. Apply to inguinal rash   Polyethyl  Glycol-Propyl Glycol 0.4-0.3 % SOLN Place 2 drops into the right eye 2 (two) times daily.   [DISCONTINUED] acetaminophen (TYLENOL) 650 MG CR tablet Take 1,300 mg by mouth daily.   [DISCONTINUED] antiseptic oral rinse (BIOTENE) LIQD by Mouth Rinse route 3 (three) times daily. 2 sprays each time   [DISCONTINUED] Cranberry (ELLURA PO) Take 36 mg by mouth daily.   [DISCONTINUED] gabapentin (NEURONTIN) 100 MG capsule Take 300 mg by mouth at bedtime.   [DISCONTINUED] ketoconazole (NIZORAL) 2 % shampoo Apply 1 application topically once a week.   [DISCONTINUED] Levothyroxine Sodium 62.5 MCG/ML SOLN Take 62.5 mcg by mouth daily. At 6 am, before breakfast   [DISCONTINUED] Melatonin 5 MG TABS Take by mouth at bedtime as needed.    [DISCONTINUED] methenamine (HIPREX) 1 g tablet Take 1 g by mouth at bedtime.   [DISCONTINUED] phenazopyridine (PYRIDIUM) 200 MG tablet Take 200 mg by mouth every 8 (eight) hours as needed  for pain.   [DISCONTINUED] polyethylene glycol (MIRALAX / GLYCOLAX) 17 g packet Take 17 g by mouth daily.   [DISCONTINUED] Probiotic Product (ALIGN) 4 MG CAPS Take 1 tablet by mouth daily.   [DISCONTINUED] sodium fluoride (PREVIDENT 5000 PLUS) 1.1 % CREA dental cream Place 1 application onto teeth every evening.   [DISCONTINUED] levothyroxine (SYNTHROID, LEVOTHROID) 75 MCG tablet TAKE 1 TABLET DAILY BEFORE BREAKFAST FOR THYROID. (Patient taking differently: 62.5 mcg.)   No facility-administered encounter medications on file as of 12/11/2020.    Review of Systems  Unable to perform ROS: Acuity of condition   Immunization History  Administered Date(s) Administered   H1N1 05/30/2008   Influenza Whole 03/17/2000, 03/05/2010, 03/17/2012   Influenza, High Dose Seasonal PF 04/07/2020   Influenza,inj,Quad PF,6+ Mos 03/14/2015, 04/07/2018, 04/15/2019   Influenza-Unspecified 04/16/2013, 03/31/2014, 04/11/2016, 04/10/2017   Moderna SARS-COV2 Booster Vaccination 05/02/2020   Moderna Sars-Covid-2  Vaccination 06/28/2019, 07/27/2019   Pneumococcal Conjugate-13 09/08/2015   Pneumococcal Polysaccharide-23 03/17/1997   Tdap 10/09/2011   Zoster Recombinat (Shingrix) 07/13/2017   Pertinent  Health Maintenance Due  Topic Date Due   INFLUENZA VACCINE  01/15/2021   DEXA SCAN  Completed   PNA vac Low Risk Adult  Completed   Fall Risk  07/28/2019 02/12/2019 04/08/2018 01/06/2018 11/26/2017  Falls in the past year? - 0 No Yes No  Number falls in past yr: 0 0 - 1 -  Injury with Fall? 0 0 - No -  Risk for fall due to : - Impaired balance/gait - - -  Follow up - Falls evaluation completed - - -   Functional Status Survey:    Vitals:   12/11/20 0953  BP: 116/63  Pulse: 73  Resp: 15  Temp: 97.7 F (36.5 C)  SpO2: 95%  Weight: 100 lb 12.8 oz (45.7 kg)  Height: 4\' 10"  (1.473 m)   Body mass index is 21.07 kg/m. Wt Readings from Last 3 Encounters:  12/11/20 100 lb 12.8 oz (45.7 kg)  11/30/20 100 lb 12.8 oz (45.7 kg)  10/19/20 100 lb 12.8 oz (45.7 kg)     Physical Exam Vitals and nursing note reviewed. Exam conducted with a chaperone present.  Constitutional:      General: She is not in acute distress.    Appearance: She is not diaphoretic.  HENT:     Head:     Comments: Enlarged vein under the left eye going around to the left temple area.  Large neoplasm to the the top of the scalp, covered with gauze, CDI    Nose: Nose normal. No congestion.     Mouth/Throat:     Mouth: Mucous membranes are dry.     Pharynx: No oropharyngeal exudate.  Eyes:     Pupils: Pupils are equal, round, and reactive to light.  Neck:     Vascular: No JVD.  Cardiovascular:     Rate and Rhythm: Normal rate and regular rhythm.     Heart sounds: No murmur heard. Pulmonary:     Effort: Pulmonary effort is normal. No respiratory distress.     Breath sounds: Rhonchi present. No wheezing.  Abdominal:     General: Bowel sounds are normal. There is no distension.     Palpations: Abdomen is soft.      Tenderness: There is no abdominal tenderness. There is no right CVA tenderness or left CVA tenderness.     Hernia: No hernia is present. There is no hernia in the left inguinal area or right inguinal  area.  Genitourinary:    Exam position: Lithotomy position.     Labia:        Right: Rash present.        Left: Rash present.      Urethra: Prolapse present. No urethral swelling or urethral lesion.  Musculoskeletal:     Right lower leg: No edema.     Left lower leg: No edema.  Lymphadenopathy:     Lower Body: No right inguinal adenopathy. No left inguinal adenopathy.  Skin:    General: Skin is warm and dry.  Neurological:     Mental Status: She is alert.     Comments: Slight right facial droop is noted. Not able to f/c for further exam. Grip strength 4/5 to BUE. Not verbal with words but moaning.     Labs reviewed: Recent Labs    01/16/20 0000  NA 137  K 4.3  CL 98*  CO2 25*  BUN 17  CREATININE 0.6  CALCIUM 9.2   No results for input(s): AST, ALT, ALKPHOS, BILITOT, PROT, ALBUMIN in the last 8760 hours. No results for input(s): WBC, NEUTROABS, HGB, HCT, MCV, PLT in the last 8760 hours. Lab Results  Component Value Date   TSH 0.19 (A) 10/23/2020   Lab Results  Component Value Date   HGBA1C 5.9 12/02/2006   Lab Results  Component Value Date   CHOL 174 11/07/2015   HDL 79 (A) 11/07/2015   LDLCALC 78 11/07/2015   LDLDIRECT 123.7 12/08/2007   TRIG 87 11/07/2015   CHOLHDL 3 07/16/2011    Significant Diagnostic Results in last 30 days:  No results found.  Assessment/Plan  1. Cerebrovascular accident (CVA), unspecified mechanism ([place]) Appears to have had a neurologic event with moaning and left facial droop. Differential includes CVA vs metastatic dz due to the enlarging neoplasm on her scalp. She is not able to eat or swallow at this time. Will discontinue all oral meds. Provide comfort care only per family request   2. Lethargy Due to #1  3. 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 Urinary retention Keep foley in place for comfort   Family/ staff Communication: discussed with both sons Mikki Santee and Wille Glaser, goals of care are to keep her comfortable at [facility].   Labs/tests ordered:  NA

## 2020-12-15 DEATH — deceased

## 2021-01-16 ENCOUNTER — Encounter (HOSPITAL_BASED_OUTPATIENT_CLINIC_OR_DEPARTMENT_OTHER): Payer: PRIVATE HEALTH INSURANCE | Admitting: Internal Medicine
